# Patient Record
Sex: Female | Born: 1970
Health system: Southern US, Community
[De-identification: ages and names within clinical notes are randomized; demographics above are authoritative.]

## PROBLEM LIST (undated history)

## (undated) DIAGNOSIS — C801 Malignant (primary) neoplasm, unspecified: Secondary | ICD-10-CM

## (undated) DIAGNOSIS — I499 Cardiac arrhythmia, unspecified: Secondary | ICD-10-CM

## (undated) DIAGNOSIS — R1011 Right upper quadrant pain: Secondary | ICD-10-CM

## (undated) DIAGNOSIS — Z9889 Other specified postprocedural states: Secondary | ICD-10-CM

## (undated) DIAGNOSIS — G43909 Migraine, unspecified, not intractable, without status migrainosus: Secondary | ICD-10-CM

## (undated) DIAGNOSIS — R42 Dizziness and giddiness: Secondary | ICD-10-CM

## (undated) DIAGNOSIS — R55 Syncope and collapse: Secondary | ICD-10-CM

## (undated) DIAGNOSIS — F419 Anxiety disorder, unspecified: Secondary | ICD-10-CM

## (undated) DIAGNOSIS — R112 Nausea with vomiting, unspecified: Secondary | ICD-10-CM

## (undated) DIAGNOSIS — K824 Cholesterolosis of gallbladder: Secondary | ICD-10-CM

## (undated) DIAGNOSIS — N809 Endometriosis, unspecified: Secondary | ICD-10-CM

## (undated) DIAGNOSIS — N83209 Unspecified ovarian cyst, unspecified side: Secondary | ICD-10-CM

## (undated) HISTORY — DX: Cholesterolosis of gallbladder: K82.4

## (undated) HISTORY — PX: OVARIAN CYST REMOVAL: SHX89

## (undated) HISTORY — DX: Syncope and collapse: R55

## (undated) HISTORY — DX: Endometriosis, unspecified: N80.9

## (undated) HISTORY — DX: Unspecified ovarian cyst, unspecified side: N83.209

## (undated) HISTORY — DX: Cardiac arrhythmia, unspecified: I49.9

## (undated) HISTORY — DX: Right upper quadrant pain: R10.11

---

## 2009-05-13 ENCOUNTER — Ambulatory Visit: Payer: Self-pay | Admitting: Family Medicine

## 2009-06-25 ENCOUNTER — Ambulatory Visit: Payer: Self-pay | Admitting: Obstetrics and Gynecology

## 2009-07-01 ENCOUNTER — Ambulatory Visit: Payer: Self-pay | Admitting: Obstetrics and Gynecology

## 2009-11-18 ENCOUNTER — Ambulatory Visit: Payer: Self-pay | Admitting: Family Medicine

## 2009-12-05 ENCOUNTER — Ambulatory Visit: Payer: Self-pay | Admitting: Family Medicine

## 2010-02-01 HISTORY — PX: ABLATION: SHX5711

## 2010-07-06 ENCOUNTER — Other Ambulatory Visit: Payer: Self-pay | Admitting: Family Medicine

## 2010-07-06 DIAGNOSIS — R928 Other abnormal and inconclusive findings on diagnostic imaging of breast: Secondary | ICD-10-CM

## 2010-07-14 ENCOUNTER — Ambulatory Visit
Admission: RE | Admit: 2010-07-14 | Discharge: 2010-07-14 | Disposition: A | Discharge status: Home / Self Care | Payer: BC Managed Care – PPO | Source: Ambulatory Visit | Attending: Family Medicine | Admitting: Family Medicine

## 2010-07-14 DIAGNOSIS — R928 Other abnormal and inconclusive findings on diagnostic imaging of breast: Secondary | ICD-10-CM

## 2011-02-02 DIAGNOSIS — R1011 Right upper quadrant pain: Secondary | ICD-10-CM

## 2011-02-02 HISTORY — DX: Right upper quadrant pain: R10.11

## 2011-06-29 ENCOUNTER — Other Ambulatory Visit: Payer: Self-pay | Admitting: Family Medicine

## 2011-06-29 DIAGNOSIS — Z1231 Encounter for screening mammogram for malignant neoplasm of breast: Secondary | ICD-10-CM

## 2011-07-15 ENCOUNTER — Ambulatory Visit
Admission: RE | Admit: 2011-07-15 | Discharge: 2011-07-15 | Disposition: A | Discharge status: Home / Self Care | Payer: BC Managed Care – PPO | Source: Ambulatory Visit | Attending: Family Medicine | Admitting: Family Medicine

## 2011-07-15 DIAGNOSIS — Z1231 Encounter for screening mammogram for malignant neoplasm of breast: Secondary | ICD-10-CM

## 2011-07-30 ENCOUNTER — Ambulatory Visit: Payer: Self-pay | Admitting: Family Medicine

## 2011-11-22 ENCOUNTER — Ambulatory Visit: Payer: Self-pay | Admitting: General Surgery

## 2012-02-02 DIAGNOSIS — N83209 Unspecified ovarian cyst, unspecified side: Secondary | ICD-10-CM

## 2012-02-02 HISTORY — DX: Unspecified ovarian cyst, unspecified side: N83.209

## 2012-07-20 ENCOUNTER — Other Ambulatory Visit: Payer: Self-pay

## 2012-07-20 DIAGNOSIS — Z1231 Encounter for screening mammogram for malignant neoplasm of breast: Secondary | ICD-10-CM

## 2012-07-26 ENCOUNTER — Ambulatory Visit
Admission: RE | Admit: 2012-07-26 | Discharge: 2012-07-26 | Disposition: A | Discharge status: Home / Self Care | Payer: BC Managed Care – PPO | Source: Ambulatory Visit

## 2012-07-26 DIAGNOSIS — Z1231 Encounter for screening mammogram for malignant neoplasm of breast: Secondary | ICD-10-CM

## 2012-09-05 ENCOUNTER — Ambulatory Visit: Payer: Self-pay | Admitting: General Surgery

## 2012-09-06 ENCOUNTER — Encounter: Payer: Self-pay | Admitting: General Surgery

## 2012-09-13 ENCOUNTER — Encounter: Payer: Self-pay | Admitting: General Surgery

## 2012-09-13 ENCOUNTER — Ambulatory Visit (INDEPENDENT_AMBULATORY_CARE_PROVIDER_SITE_OTHER): Payer: BC Managed Care – PPO | Admitting: General Surgery

## 2012-09-13 VITALS — BP 128/72 | HR 74 | Resp 12 | Ht 67.5 in | Wt 204.0 lb

## 2012-09-13 DIAGNOSIS — K824 Cholesterolosis of gallbladder: Secondary | ICD-10-CM | POA: Insufficient documentation

## 2012-09-13 NOTE — Progress Notes (Signed)
Patient ID: Norma Wall, female   DOB: 1970/07/11, 42 y.o.   MRN: [id number]  Chief Complaint  Patient presents with  . Follow-up    1 year follow up gallbladder polyps    HPI Norma Wall is a 42 y.o. female who presents for a 1 year follow up for gallbladder polyps. The patient had ultrasound on 09/05/12. She states she still has some off and on dull pain but in within the last 3 weeks she had some sharper pains. Otherwise doing well.    The patient reports no postprandial discomfort. No dietary intolerance. When she has occasionally had a sharp pain in the right upper quadrant this is more towards the axillary line (anterior) then toward the midclavicular line.  She is experiencing some lower pelvic discomfort attributed to an ovarian cyst being followed by Senaida Lange, M.D.  HPI  Past Medical History  Diagnosis Date  . Abdominal pain, right upper quadrant 2013  . Gallbladder polyp   . Ovarian cyst 2014    right ovary    Past Surgical History  Procedure Laterality Date  . Ablation  2012    Family History  Problem Relation Age of Onset  . Lung cancer Father   . Diabetes Maternal Grandmother     Social History History  Substance Use Topics  . Smoking status: Former Smoker    Types: Cigarettes    Quit date: 02/01/1997  . Smokeless tobacco: Never Used  . Alcohol Use: Yes    No Known Allergies  Current Outpatient Prescriptions  Medication Sig Dispense Refill  . Calcium Carbonate-Vitamin D (CALTRATE 600+D PO) Take 2 tablets by mouth daily.      . Glucosamine-Vitamin D (GLUCOSAMINE PLUS VITAMIN D PO) Take 1 tablet by mouth daily.      . Multiple Vitamin (MULTIVITAMIN) tablet Take 1 tablet by mouth daily.      [name] spironolactone (ALDACTONE) 100 MG tablet Take 50 mg by mouth daily.       No current facility-administered medications for this visit.    Review of Systems Review of Systems  Constitutional: Negative.   Respiratory: Negative.    Cardiovascular: Negative.   Gastrointestinal: Negative.     Blood pressure 128/72, pulse 74, resp. rate 12, height 5' 7.5" (1.715 m), weight 204 lb (92.534 kg).  Physical Exam Physical Exam  Constitutional: She is oriented to person, place, and time. She appears well-developed and well-nourished.  Neck: No thyromegaly present.  Cardiovascular: Normal rate, regular rhythm and normal heart sounds.   No murmur heard. Pulmonary/Chest: Effort normal and breath sounds normal.  Abdominal: Soft. Normal appearance and bowel sounds are normal. There is no tenderness.  Lymphadenopathy:    She has no cervical adenopathy.  Neurological: She is alert and oriented to person, place, and time.  Skin: Skin is warm and dry.    Data Reviewed Abdominal ultrasound dated September 05, 2012 suggests a possible gallbladder polyp there the neck, 3.1 mm in diameter. No other abnormality appreciated. Previous abdominal ultrasound has suggested the possibility of polyps, but no evidence of cholelithiasis, gallbladder wall thickening or other upper abdominal pathology. A CT of the abdomen dated November 22, 2011 was notable only for fullness of the cervix.  Assessment    Gallbladder polyp, asymptomatic.  History right ovarian cyst, scheduled for careful GU in followup.     Plan    Assuming no interval change, we'll complete her surveillance of the gallbladder polyp with a repeat ultrasound in one year. Should she  be a candidate for diagnostic laparoscopy, he would be reasonable to evaluate the liver and gallbladder surface during the procedure. The possibility for cold endometriosis is present.        Earline Mayotte 09/13/2012, 10:24 PM

## 2012-09-13 NOTE — Patient Instructions (Addendum)
Patient to return in 1 year with abdominal ultrasound.

## 2013-01-01 DIAGNOSIS — N809 Endometriosis, unspecified: Secondary | ICD-10-CM

## 2013-01-01 HISTORY — DX: Endometriosis, unspecified: N80.9

## 2013-01-09 ENCOUNTER — Ambulatory Visit: Payer: Self-pay | Admitting: Obstetrics and Gynecology

## 2013-01-09 LAB — CBC
MCH: 30.2 pg (ref 26.0–34.0)
Platelet: 173 10*3/uL (ref 150–440)
WBC: 5.3 10*3/uL (ref 3.6–11.0)

## 2013-01-09 LAB — BASIC METABOLIC PANEL
Creatinine: 0.64 mg/dL (ref 0.60–1.30)
EGFR (Non-African Amer.): >60
Glucose: 88 mg/dL (ref 65–99)
Potassium: 3.8 mmol/L (ref 3.5–5.1)

## 2013-01-26 ENCOUNTER — Ambulatory Visit: Payer: Self-pay | Admitting: Obstetrics and Gynecology

## 2013-01-29 LAB — PATHOLOGY REPORT

## 2013-08-10 ENCOUNTER — Other Ambulatory Visit: Payer: Self-pay

## 2013-08-10 DIAGNOSIS — Z1231 Encounter for screening mammogram for malignant neoplasm of breast: Secondary | ICD-10-CM

## 2013-08-14 ENCOUNTER — Ambulatory Visit
Admission: RE | Admit: 2013-08-14 | Discharge: 2013-08-14 | Disposition: A | Discharge status: Home / Self Care | Payer: BC Managed Care – PPO | Source: Ambulatory Visit

## 2013-08-14 ENCOUNTER — Encounter (INDEPENDENT_AMBULATORY_CARE_PROVIDER_SITE_OTHER): Payer: Self-pay

## 2013-08-14 DIAGNOSIS — Z1231 Encounter for screening mammogram for malignant neoplasm of breast: Secondary | ICD-10-CM

## 2013-08-15 ENCOUNTER — Other Ambulatory Visit: Payer: Self-pay | Admitting: Obstetrics and Gynecology

## 2013-08-15 DIAGNOSIS — R928 Other abnormal and inconclusive findings on diagnostic imaging of breast: Secondary | ICD-10-CM

## 2013-08-22 ENCOUNTER — Ambulatory Visit
Admission: RE | Admit: 2013-08-22 | Discharge: 2013-08-22 | Disposition: A | Discharge status: Home / Self Care | Payer: BC Managed Care – PPO | Source: Ambulatory Visit | Attending: Obstetrics and Gynecology | Admitting: Obstetrics and Gynecology

## 2013-08-22 ENCOUNTER — Other Ambulatory Visit: Payer: Self-pay | Admitting: Obstetrics and Gynecology

## 2013-08-22 DIAGNOSIS — R928 Other abnormal and inconclusive findings on diagnostic imaging of breast: Secondary | ICD-10-CM

## 2013-09-06 ENCOUNTER — Encounter: Payer: Self-pay | Admitting: General Surgery

## 2013-09-06 ENCOUNTER — Ambulatory Visit: Payer: Self-pay | Admitting: General Surgery

## 2013-09-18 ENCOUNTER — Ambulatory Visit: Payer: BC Managed Care – PPO | Admitting: General Surgery

## 2013-12-03 ENCOUNTER — Encounter: Payer: Self-pay | Admitting: General Surgery

## 2014-05-25 NOTE — Op Note (Signed)
PATIENT NAME:  Norma Wall, Norma Wall MR#:  [id number] DATE OF BIRTH:  03-Jun-1970  DATE OF PROCEDURE:  01/26/2013  PREOPERATIVE DIAGNOSIS: Dysmenorrhea and pelvic pain.   POSTOPERATIVE DIAGNOSIS: Dysmenorrhea and pelvic pain and stage 2 endometriosis.  PROCEDURE PERFORMED: Diagnostic laparoscopy, bilateral ovarian cystectomy.   PRIMARY SURGEON: Malachy Mood, MD  ANESTHESIA USED: General.  ESTIMATED BLOOD LOSS: 25 mL  INTRAOPERATIVE FLUIDS: 1 liter.  URINE OUTPUT: 250 mL.  PREOPERATIVE ANTIBIOTICS: None.   DRAINS OR TUBES: None.   IMPLANTS: None.   COMPLICATIONS: None.   FINDINGS: peritoneal window in the posterior cul-de-sac on the left, both ovarian fossae obliterated by endometriosis. The left ovary contained what appeared to be an endometrioma. The right ovary contained a simple serous cyst.  SPECIMENS REMOVED: Right and left ovarian cyst wall.  PATIENT CONDITION FOLLOWING PROCEDURE: Stable.   PROCEDURE IN DETAIL: The risks, benefits, and alternatives to the procedure were discussed with the patient prior to proceeding to the operating room. The patient was taken to the operating room where she was placed under general endotracheal anesthesia. She was positioned in the dorsal lithotomy position using Allen stirrups, prepped and draped in the usual sterile fashion. A time out procedure was performed. Attention was turned to the patient's pelvis and operative speculum was placed. A single-tooth tenaculum was used to grasp the anterior lip of the cervix and the cervix was dilated to allow placement of a Hulka tenaculum. Upon placement of the Hulka tenaculum, the single-tooth and speculum were removed. Prior to placement of the Hulka tenaculum, the patient's bladder was drained with a red rubber catheter. Attention turned to the patient's abdomen. The umbilicus was infiltrated with 1% lidocaine. A stab incision was made at the base of the umbilicus and a 5 mm XL trocar was used to gain  entry into the peritoneal cavity under direct visualization. Pneumoperitoneum was established. Lateral assistant ports were placed. These were 5 mm and placed under direct visualization. Inspection of the pelvis noted the above findings. The right ovary was incised using the 44mm harmonic noting drainage of chocolate-colored fluid. The cyst wall was excised using a 5 mm LigaSure. Attention was turned to the patient's right ovary. The cyst on the right ovary was also incised and noted clear serous fluid. The cyst wall was also removed using blunt graspers. Following this the remainder of the abdomen was inspected and noted to be normal. Pneumoperitoneum was evacuated. The trocars were removed. Sponge, needle, and instrument counts were correct x2. Each trocar site was dressed with Dermabond. The patient was taken to the recovery room in stable condition.  ____________________________ Stoney Bang. Georgianne Fick, MD ams:sb D: 01/28/2013 22:28:09 ET T: 01/29/2013 07:31:48 ET JOB#: [id number]  cc: Stoney Bang. Georgianne Fick, MD, <Dictator> Conan Bowens Madelon Lips MD ELECTRONICALLY SIGNED 02/22/2013 8:53

## 2014-07-23 ENCOUNTER — Other Ambulatory Visit: Payer: Self-pay

## 2014-07-23 DIAGNOSIS — Z1231 Encounter for screening mammogram for malignant neoplasm of breast: Secondary | ICD-10-CM

## 2014-08-08 ENCOUNTER — Other Ambulatory Visit: Payer: Self-pay

## 2014-08-08 DIAGNOSIS — K824 Cholesterolosis of gallbladder: Secondary | ICD-10-CM

## 2014-08-21 ENCOUNTER — Ambulatory Visit
Admission: RE | Admit: 2014-08-21 | Discharge: 2014-08-21 | Disposition: A | Discharge status: Home / Self Care | Payer: BC Managed Care – PPO | Source: Ambulatory Visit

## 2014-08-21 DIAGNOSIS — Z1231 Encounter for screening mammogram for malignant neoplasm of breast: Secondary | ICD-10-CM

## 2014-08-23 ENCOUNTER — Other Ambulatory Visit: Payer: Self-pay | Admitting: Family Medicine

## 2014-08-23 DIAGNOSIS — R928 Other abnormal and inconclusive findings on diagnostic imaging of breast: Secondary | ICD-10-CM

## 2014-08-28 ENCOUNTER — Other Ambulatory Visit: Payer: Self-pay | Admitting: Family Medicine

## 2014-08-28 DIAGNOSIS — G43909 Migraine, unspecified, not intractable, without status migrainosus: Secondary | ICD-10-CM | POA: Insufficient documentation

## 2014-08-28 DIAGNOSIS — G43809 Other migraine, not intractable, without status migrainosus: Secondary | ICD-10-CM

## 2014-08-29 ENCOUNTER — Ambulatory Visit
Admission: RE | Admit: 2014-08-29 | Discharge: 2014-08-29 | Disposition: A | Discharge status: Home / Self Care | Payer: BC Managed Care – PPO | Source: Ambulatory Visit | Attending: Family Medicine | Admitting: Family Medicine

## 2014-08-29 DIAGNOSIS — R928 Other abnormal and inconclusive findings on diagnostic imaging of breast: Secondary | ICD-10-CM

## 2014-09-09 ENCOUNTER — Other Ambulatory Visit: Payer: Self-pay

## 2014-09-09 ENCOUNTER — Ambulatory Visit
Admission: RE | Admit: 2014-09-09 | Discharge: 2014-09-09 | Disposition: A | Discharge status: Home / Self Care | Payer: BC Managed Care – PPO | Source: Ambulatory Visit | Attending: General Surgery | Admitting: General Surgery

## 2014-09-09 ENCOUNTER — Ambulatory Visit (INDEPENDENT_AMBULATORY_CARE_PROVIDER_SITE_OTHER): Payer: BC Managed Care – PPO | Admitting: Physician Assistant

## 2014-09-09 ENCOUNTER — Encounter: Payer: Self-pay | Admitting: Physician Assistant

## 2014-09-09 VITALS — BP 106/68 | HR 68 | Temp 97.6°F | Resp 16 | Wt 224.0 lb

## 2014-09-09 DIAGNOSIS — N3001 Acute cystitis with hematuria: Secondary | ICD-10-CM

## 2014-09-09 DIAGNOSIS — K824 Cholesterolosis of gallbladder: Secondary | ICD-10-CM | POA: Diagnosis not present

## 2014-09-09 DIAGNOSIS — R300 Dysuria: Secondary | ICD-10-CM

## 2014-09-09 LAB — POCT URINALYSIS DIPSTICK
Bilirubin, UA: NEGATIVE
Glucose, UA: NEGATIVE
Ketones, UA: NEGATIVE
Nitrite, UA: NEGATIVE
Protein, UA: NEGATIVE
Spec Grav, UA: 1.015
Urobilinogen, UA: 0.2
pH, UA: 5

## 2014-09-09 MED ORDER — NITROFURANTOIN MONOHYD MACRO 100 MG PO CAPS: 100.0000 mg | ORAL_CAPSULE | Freq: Two times a day (BID) | ORAL | Status: DC

## 2014-09-09 NOTE — Progress Notes (Signed)
Patient ID: Norma Wall, female   DOB: Jun 03, 1970, 44 y.o.   MRN: [id number]   Patient: Norma Wall Female    DOB: 30-May-1970   44 y.o.   MRN: [id number] Visit Date: 09/09/2014  Today's Provider: Mar Daring, PA-C   Chief Complaint  Patient presents with  . Urinary Tract Infection    X 1 day   Subjective:    HPI  Norma Wall no leg is a 44 year old female that returns to the office today for dysuria.   She states that she had one episode of burning with urination this morning. She also mentions, however, that she did have foul smelling urine on Thursday and Friday of this past week but didn't think anything of it.   She states she has had UTIs in the past but she has not had one in many years. She does mention she has been swimming more recently and was curious that that may have been the cause of the UTI. She denies any fevers, chills, nausea or vomiting,  Or hematuria.   Previous Medications   ANUCORT-HC 25 MG SUPPOSITORY       CALCIUM CARBONATE-VITAMIN D (CALTRATE 600+D PO)    Take 2 tablets by mouth daily.   GLUCOSAMINE-VITAMIN D (GLUCOSAMINE PLUS VITAMIN D PO)    Take 1 tablet by mouth daily.   MEDROXYPROGESTERONE (DEPO-PROVERA) 150 MG/ML INJECTION    use as directed every 3 months   MULTIPLE VITAMIN (MULTIVITAMIN) TABLET    Take 1 tablet by mouth daily.   PROMETHAZINE (PHENERGAN) 25 MG TABLET    take 1 tablet by mouth every 4 hours if needed for nausea and vomiting   SPIRONOLACTONE (ALDACTONE) 100 MG TABLET    Take 50 mg by mouth daily.    Review of Systems  Constitutional: Negative for fever and chills.  Gastrointestinal: Negative for nausea and vomiting.  Genitourinary: Positive for dysuria and pelvic pain (she has pelvic pain constantly due to adhesions and cyst). Negative for urgency, frequency, hematuria, flank pain, decreased urine volume, vaginal discharge, difficulty urinating and vaginal pain.    History  Substance Use Topics  . Smoking status:  Former Smoker    Types: Cigarettes    Quit date: 02/01/1997  . Smokeless tobacco: Never Used  . Alcohol Use: Yes   Objective:   BP 106/68 mmHg  Pulse 68  Temp(Src) 97.6 F (36.4 C) (Oral)  Resp 16  Wt 224 lb (101.606 kg)  Physical Exam  Constitutional: She appears well-developed and well-nourished. No distress.  Cardiovascular: Normal rate, regular rhythm, normal heart sounds and intact distal pulses.  Exam reveals no gallop and no friction rub.   No murmur heard. Pulmonary/Chest: Effort normal and breath sounds normal. No respiratory distress. She has no wheezes. She has no rales. She exhibits no tenderness.  Abdominal: Soft. Normal appearance and bowel sounds are normal. She exhibits no distension and no mass. There is no hepatosplenomegaly. There is tenderness in the suprapubic area. There is no rebound, no guarding and no CVA tenderness.  Suprapubic tenderness  Skin: She is not diaphoretic.  Vitals reviewed.       Assessment & Plan:     1. Dysuria  UA was positive with leukocytes and blood. - POCT Urinalysis Dipstick  2. Acute cystitis with hematuria  Will treat with Macrobid as below. I will also send off her urine for urine culture and sensitivities. Will adjust antibiotics as needed once I receive the results of the urine culture. She is to  call the office if symptoms do not improve or worsen. - nitrofurantoin, macrocrystal-monohydrate, (MACROBID) 100 MG capsule; Take 1 capsule (100 mg total) by mouth 2 (two) times daily.  Dispense: 14 capsule; Refill: 0 - Urine Culture   Follow up: No Follow-up on file.

## 2014-09-09 NOTE — Patient Instructions (Signed)

## 2014-09-10 ENCOUNTER — Ambulatory Visit: Payer: Self-pay | Admitting: General Surgery

## 2014-09-11 ENCOUNTER — Telehealth: Payer: Self-pay

## 2014-09-11 LAB — URINE CULTURE

## 2014-09-11 NOTE — Telephone Encounter (Signed)
-----   Message from Mar Daring, [place] sent at 09/11/2014 11:43 AM EDT ----- Urine culture was positive but is susceptible to antibiotic you were prescribed.  Continue antibiotic until completed even if Sx subside.  Thanks! -JB

## 2014-09-11 NOTE — Telephone Encounter (Signed)
Patient advised as directed below.  Thanks,  -Arabell Neria 

## 2014-09-11 NOTE — Telephone Encounter (Signed)
LMTCB 09/11/14  Thanks,  -Joseline

## 2014-09-17 ENCOUNTER — Encounter: Payer: Self-pay | Admitting: General Surgery

## 2014-09-17 ENCOUNTER — Ambulatory Visit (INDEPENDENT_AMBULATORY_CARE_PROVIDER_SITE_OTHER): Payer: BC Managed Care – PPO | Admitting: General Surgery

## 2014-09-17 VITALS — BP 110/60 | HR 74 | Resp 12 | Ht 67.0 in | Wt 221.0 lb

## 2014-09-17 DIAGNOSIS — K824 Cholesterolosis of gallbladder: Secondary | ICD-10-CM

## 2014-09-17 NOTE — Patient Instructions (Signed)
Call with any problems or new symptoms.

## 2014-09-17 NOTE — Progress Notes (Signed)
Patient ID: Norma Wall, female   DOB: Jul 17, 1970, 44 y.o.   MRN: [id number]  Chief Complaint  Patient presents with  . Follow-up    gall bladder polyp    HPI Norma Wall is a 44 y.o. female here today for her one year follow up gall bladder polyp. Ultrasound was 09-09-14. She states she still has some off and on dull pain on her left side that has not changed in the last year. She denies any GI symptoms. She reports that she has developed some hip pains that she will be following up on.     HPI  Past Medical History  Diagnosis Date  . Abdominal pain, right upper quadrant 2013  . Gallbladder polyp   . Ovarian cyst 2014    right ovary  . Endometriosis 01/2013    Past Surgical History  Procedure Laterality Date  . Ablation  2012    cyst removal    Family History  Problem Relation Age of Onset  . Lung cancer Father   . Diabetes Maternal Grandmother     Social History Social History  Substance Use Topics  . Smoking status: Former Smoker    Types: Cigarettes    Quit date: 02/01/1997  . Smokeless tobacco: Never Used  . Alcohol Use: Yes    No Known Allergies  Current Outpatient Prescriptions  Medication Sig Dispense Refill  . ANUCORT-HC 25 MG suppository Place 25 mg rectally as needed.   0  . Calcium Carbonate-Vitamin D (CALTRATE 600+D PO) Take 2 tablets by mouth as needed.     . Glucosamine-Vitamin D (GLUCOSAMINE PLUS VITAMIN D PO) Take 1 tablet by mouth daily.    . medroxyPROGESTERone (DEPO-PROVERA) 150 MG/ML injection use as directed every 3 months  0  . promethazine (PHENERGAN) 25 MG tablet take 1 tablet by mouth every 4 hours if needed for nausea and vomiting 20 tablet 0  . spironolactone (ALDACTONE) 100 MG tablet Take 50 mg by mouth daily.    . Multiple Vitamin (MULTIVITAMIN) tablet Take 1 tablet by mouth daily.     No current facility-administered medications for this visit.    Review of Systems Review of Systems  Constitutional:  Negative.   Respiratory: Negative.   Cardiovascular: Negative.   Gastrointestinal: Negative.     Blood pressure 110/60, pulse 74, resp. rate 12, height 5\' 7"  (1.702 m), weight 221 lb (100.245 kg).  Physical Exam Physical Exam  Constitutional: She is oriented to person, place, and time. She appears well-developed and well-nourished.  Neck: Neck supple.  Cardiovascular: Normal rate, regular rhythm and normal heart sounds.   Pulmonary/Chest: Effort normal and breath sounds normal.  Abdominal: Soft. Bowel sounds are normal.    Lymphadenopathy:    She has no cervical adenopathy.  Neurological: She is alert and oriented to person, place, and time.  Skin: Skin is warm and dry.  Psychiatric: She has a normal mood and affect.    Data Reviewed Abdominal ultrasound dated 09/09/2014 documented a 0.5 mm change over 2 years in the gallbladder polyp. No other evidence of cholecystitis or cholelithiasis.  Assessment    Gallbladder polyp, asymptomatic.  Intermittent, transient right lower quadrant discomfort without clear etiology.    Plan    The patient reports the right lower quadrant pain has been present intermittently over the past 2 years. There is no increase in frequency or severity. It does not interfere with activity. She is most commonly aware of it when she is at rest. With no  change in bowel or bladder function. No weight loss or other constitutional symptoms, observation alone is warranted at this time.  With a less than 1 mm change and the gallbladder polyp over 2 years, no further follow-up is required.  The patient was encouraged to call should she develop new abdominal symptoms, follow up otherwise will be on an as-needed basis.     PCP:  Etheleen Mayhew 09/17/2014, 9:47 PM

## 2014-11-07 ENCOUNTER — Other Ambulatory Visit: Payer: Self-pay | Admitting: Family Medicine

## 2014-11-07 NOTE — Telephone Encounter (Signed)
See patient request for medication

## 2014-11-07 NOTE — Telephone Encounter (Signed)
Printed.  Thanks.  

## 2014-11-20 ENCOUNTER — Ambulatory Visit (INDEPENDENT_AMBULATORY_CARE_PROVIDER_SITE_OTHER): Payer: BC Managed Care – PPO | Admitting: Family Medicine

## 2014-11-20 ENCOUNTER — Encounter: Payer: Self-pay | Admitting: Family Medicine

## 2014-11-20 VITALS — BP 110/72 | HR 80 | Temp 98.2°F | Resp 16 | Ht 67.0 in | Wt 226.0 lb

## 2014-11-20 DIAGNOSIS — Z209 Contact with and (suspected) exposure to unspecified communicable disease: Secondary | ICD-10-CM

## 2014-11-20 DIAGNOSIS — E875 Hyperkalemia: Secondary | ICD-10-CM | POA: Insufficient documentation

## 2014-11-20 DIAGNOSIS — K644 Residual hemorrhoidal skin tags: Secondary | ICD-10-CM | POA: Insufficient documentation

## 2014-11-20 DIAGNOSIS — L309 Dermatitis, unspecified: Secondary | ICD-10-CM | POA: Insufficient documentation

## 2014-11-20 DIAGNOSIS — M7630 Iliotibial band syndrome, unspecified leg: Secondary | ICD-10-CM | POA: Diagnosis not present

## 2014-11-20 DIAGNOSIS — R42 Dizziness and giddiness: Secondary | ICD-10-CM

## 2014-11-20 DIAGNOSIS — M25551 Pain in right hip: Secondary | ICD-10-CM | POA: Insufficient documentation

## 2014-11-20 DIAGNOSIS — M719 Bursopathy, unspecified: Secondary | ICD-10-CM | POA: Diagnosis not present

## 2014-11-20 DIAGNOSIS — F439 Reaction to severe stress, unspecified: Secondary | ICD-10-CM

## 2014-11-20 DIAGNOSIS — R002 Palpitations: Secondary | ICD-10-CM

## 2014-11-20 DIAGNOSIS — E669 Obesity, unspecified: Secondary | ICD-10-CM | POA: Insufficient documentation

## 2014-11-20 NOTE — Patient Instructions (Signed)
Bursitis °Bursitis is inflammation and irritation of a bursa, which is one of the small, fluid-filled sacs that cushion and protect the moving parts of your body. These sacs are located between bones and muscles, muscle attachments, or skin areas next to bones. A bursa protects these structures from the wear and tear that results from frequent movement. °An inflamed bursa causes pain and swelling. Fluid may build up inside the sac. Bursitis is most common near joints, especially the knees, elbows, hips, and shoulders. °CAUSES °Bursitis can be caused by:  °· Injury from: °¨ A direct blow, like falling on your knee or elbow. °¨ Overuse of a joint (repetitive stress). °· Infection. This can happen if bacteria gets into a bursa through a cut or scrape near a joint. °· Diseases that cause joint inflammation, such as gout and rheumatoid arthritis. °RISK FACTORS °You may be at risk for bursitis if you:  °· Have a job or hobby that involves a lot of repetitive stress on your joints. °· Have a condition that weakens your body's defense system (immune system), such as diabetes, cancer, or HIV. °· Lift and reach overhead often. °· Kneel or lean on hard surfaces often. °· Run or walk often. °SIGNS AND SYMPTOMS °The most common signs and symptoms of bursitis are: °· Pain that gets worse when you move the affected body part or put weight on it. °· Inflammation. °· Stiffness. °Other signs and symptoms may include: °· Redness. °· Tenderness. °· Warmth. °· Pain that continues after rest. °· Fever and chills. This may occur in bursitis caused by infection. °DIAGNOSIS °Bursitis may be diagnosed by:  °· Medical history and physical exam. °· MRI. °· A procedure to drain fluid from the bursa with a needle (aspiration). The fluid may be checked for signs of infection or gout. °· Blood tests to rule out other causes of inflammation. °TREATMENT  °Bursitis can usually be treated at home with rest, ice, compression, and elevation (RICE). For  mild bursitis, RICE treatment may be all you need. Other treatments may include: °· Nonsteroidal anti-inflammatory drugs (NSAIDs) to treat pain and inflammation. °· Corticosteroids to fight inflammation. You may have these drugs injected into and around the area of bursitis. °· Aspiration of bursitis fluid to relieve pain and improve movement. °· Antibiotic medicine to treat an infected bursa. °· A splint, brace, or walking aid. °· Physical therapy if you continue to have pain or limited movement. °· Surgery to remove a damaged or infected bursa. This may be needed if you have a very bad case of bursitis or if other treatments have not worked. °HOME CARE INSTRUCTIONS  °· Take medicines only as directed by your health care provider. °· If you were prescribed an antibiotic medicine, finish it all even if you start to feel better. °· Rest the affected area as directed by your health care provider. °¨ Keep the area elevated. °¨ Avoid activities that make pain worse. °· Apply ice to the injured area: °¨ Place ice in a plastic bag. °¨ Place a towel between your skin and the bag. °¨ Leave the ice on for 20 minutes, 2-3 times a day. °· Use splints, braces, pads, or walking aids as directed by your health care provider. °· Keep all follow-up visits as directed by your health care provider. This is important. °PREVENTION  °· Wear knee pads if you kneel often. °· Wear sturdy running or walking shoes that fit you well. °· Take regular breaks from repetitive activity. °· Warm   up by stretching before doing any strenuous activity. °· Maintain a healthy weight or lose weight as recommended by your health care provider. Ask your health care provider if you need help. °· Exercise regularly. Start any new physical activity gradually. °SEEK MEDICAL CARE IF:  °· Your bursitis is not responding to treatment or home care. °· You have a fever. °· You have chills. °  °This information is not intended to replace advice given to you by your  health care provider. Make sure you discuss any questions you have with your health care provider. °  °Document Released: 01/16/2000 Document Revised: 10/09/2014 Document Reviewed: 04/09/2013 °Elsevier Interactive Patient Education ©2016 [employer] ° °

## 2014-11-20 NOTE — Progress Notes (Signed)
Patient ID: Norma Wall, female   DOB: 10-Mar-1970, 44 y.o.   MRN: [id number]        Patient: Norma Wall Female    DOB: 08/04/1970   44 y.o.   MRN: [id number] Visit Date: 11/20/2014  Today's Provider: Margarita Rana, MD   Chief Complaint  Patient presents with  . Hip Pain   Subjective:    Hip Pain  There was no injury mechanism (Can be with sitting and with certain postiions and getting up to walk.  ). The pain is present in the left hip and right hip (right hip pain worse than left). The quality of the pain is described as aching. The pain is at a severity of 4/10 (at the worst). The pain has been fluctuating since onset. Associated symptoms include muscle weakness (in thigh, hamsting and lower back). Pertinent negatives include no inability to bear weight, loss of motion, loss of sensation, numbness or tingling. Nothing (lack of movement pt feels stiff when starting to move) aggravates the symptoms. She has tried NSAIDs for the symptoms. The treatment provided moderate (Can not sit criss cross.  Can not get up. Also sleeping.  ) relief.   Walking at work.  6000 steps today, 8000 or 9000 today.   Does stretch in am. Not sure what she is doing is ok, or if there is something else she should be doing. No previous work up.  Is getting worse.  Can not sit with legs crossed.       Allergies  Allergen Reactions  . Other     Itching to cantaloupe and carrots   Previous Medications   AFLURIA PRESERVATIVE FREE 0.5 ML SUSY    inject 0.5 milliliter intramuscularly   ANUCORT-HC 25 MG SUPPOSITORY    Place 25 mg rectally as needed.    CALCIUM CARBONATE-VITAMIN D (CALTRATE 600+D PO)    Take 2 tablets by mouth as needed.    CLONAZEPAM (KLONOPIN) 0.5 MG TABLET    take 1 tablet by mouth twice a day if needed anxiety or PALPITATIONS.   DICLOFENAC (VOLTAREN) 75 MG EC TABLET    Take by mouth.   GLUCOSAMINE-VITAMIN D (GLUCOSAMINE PLUS VITAMIN D PO)    Take 1 tablet by mouth daily.   MEDROXYPROGESTERONE (DEPO-PROVERA) 150 MG/ML INJECTION    use as directed every 3 months   MULTIPLE VITAMIN (MULTIVITAMIN) TABLET    Take 1 tablet by mouth daily.   NITROFURANTOIN, MACROCRYSTAL-MONOHYDRATE, (MACROBID) 100 MG CAPSULE    take 1 capsule by mouth twice a day for 7 days   PROMETHAZINE (PHENERGAN) 25 MG TABLET    take 1 tablet by mouth every 4 hours if needed for nausea and vomiting   SPIRONOLACTONE (ALDACTONE) 100 MG TABLET    Take 50 mg by mouth daily.    Review of Systems  Constitutional: Negative for activity change and appetite change.  Musculoskeletal: Positive for myalgias, arthralgias and gait problem. Negative for back pain.  Neurological: Negative for tingling and numbness.    Social History  Substance Use Topics  . Smoking status: Former Smoker    Types: Cigarettes    Quit date: 02/01/1997  . Smokeless tobacco: Never Used  . Alcohol Use: No   Objective:   BP 110/72 mmHg  Pulse 80  Temp(Src) 98.2 F (36.8 C) (Oral)  Resp 16  Ht 5\' 7"  (1.702 m)  Wt 226 lb (102.513 kg)  BMI 35.39 kg/m2  Physical Exam  Constitutional: She is oriented to person, place, and  time. She appears well-developed and well-nourished.  Musculoskeletal: Normal range of motion. She exhibits no edema or tenderness.  Neurological: She is alert and oriented to person, place, and time. Coordination normal.      Assessment & Plan:     1. Bursitis Suspect bursitis. Ice three times daily and call if does not improve and can schedule injection with Tawanna Sat.   2. Iliotibial band syndrome, unspecified laterality Suspect underlying cause of pain. Recommend roll with rolling stick twice day and stretch (shown bed stretch in office).  Will refer to PT if does not  Improve.       Margarita Rana, MD  [facility]

## 2015-01-08 ENCOUNTER — Encounter: Payer: Self-pay | Admitting: Physician Assistant

## 2015-01-08 ENCOUNTER — Telehealth: Payer: Self-pay | Admitting: Physician Assistant

## 2015-01-08 ENCOUNTER — Ambulatory Visit
Admission: RE | Admit: 2015-01-08 | Discharge: 2015-01-08 | Disposition: A | Discharge status: Home / Self Care | Payer: BC Managed Care – PPO | Source: Ambulatory Visit | Attending: Physician Assistant | Admitting: Physician Assistant

## 2015-01-08 ENCOUNTER — Ambulatory Visit (INDEPENDENT_AMBULATORY_CARE_PROVIDER_SITE_OTHER): Payer: BC Managed Care – PPO | Admitting: Physician Assistant

## 2015-01-08 VITALS — BP 110/70 | HR 82 | Temp 98.3°F | Resp 16 | Wt 225.8 lb

## 2015-01-08 DIAGNOSIS — M6289 Other specified disorders of muscle: Secondary | ICD-10-CM | POA: Insufficient documentation

## 2015-01-08 DIAGNOSIS — M79662 Pain in left lower leg: Secondary | ICD-10-CM | POA: Diagnosis not present

## 2015-01-08 DIAGNOSIS — R059 Cough, unspecified: Secondary | ICD-10-CM

## 2015-01-08 DIAGNOSIS — R05 Cough: Secondary | ICD-10-CM | POA: Diagnosis not present

## 2015-01-08 DIAGNOSIS — M7989 Other specified soft tissue disorders: Secondary | ICD-10-CM | POA: Diagnosis not present

## 2015-01-08 DIAGNOSIS — J069 Acute upper respiratory infection, unspecified: Secondary | ICD-10-CM | POA: Diagnosis not present

## 2015-01-08 MED ORDER — AZITHROMYCIN 250 MG PO TABS: ORAL_TABLET | ORAL | Status: DC

## 2015-01-08 MED ORDER — HYDROCODONE-HOMATROPINE 5-1.5 MG/5ML PO SYRP
5.0000 mL | ORAL_SOLUTION | Freq: Three times a day (TID) | ORAL | Status: DC | PRN
Start: 2015-01-08 — End: 2015-03-25

## 2015-01-08 NOTE — Telephone Encounter (Signed)
[employer] would like a peer to peer to get MRI approved.Phone # [phone].Case will be closed Dec 9th at 4:00 PM

## 2015-01-08 NOTE — Patient Instructions (Signed)
Medial Head Gastrocnemius Tear With Rehab °Medial head gastrocnemius tear, also called tennis leg, is a tear (strain) in a muscle or tendon of the inner portion (medial head) of one of the calf muscles (gastrocnemius). The inner portion of the calf muscle attaches to the thigh bone (femur) and is responsible for bending the knee and straightening the foot (standing "on tiptoe"). Strains are classified into three categories. Grade 1 strains cause pain, but the tendon is not lengthened. Grade 2 strains include a lengthened ligament, due to the ligament being stretched or partially ruptured. With grade 2 strains there is still function, although function may be decreased. Grade 3 strains involve a complete tear of the tendon or muscle, and function is usually impaired. °SYMPTOMS  °· Sudden "pop" or tear felt at the time of injury. °· Pain, tenderness, swelling, warmth, or redness over the middle inner calf. °· Pain and weakness with ankle motion, especially flexing the ankle against resistance, as well as pain with lifting up the foot (extending the ankle). °· Bruising (contusion) of the calf, heel, and sometimes the foot within 48 hours of injury. °· Muscle spasm in the calf. °CAUSES  °Muscle and ligament strains occur when a force is placed on the muscle or ligament that is greater than it can handle. Common causes of injury include: °· Direct hit (trauma) to the calf. °· Sudden forceful pushing off or landing on the foot (jumping, landing, serving a tennis ball, lunging). °RISK INCREASES WITH: °· Sports that require sudden, explosive calf muscle contraction, such as those involving jumping (basketball), hill running, quick starts (running), or lunging (racquetball, tennis). °· Contact sports (football, soccer, hockey). °· Poor strength and flexibility. °· Previous lower limb injury. °PREVENTION °· Warm up and stretch properly before activity. °· Allow for adequate recovery between workouts. °· Maintain physical  fitness: °¨ Strength, flexibility, and endurance. °¨ Cardiovascular fitness. °· Learn and use proper exercise technique. °· Complete rehabilitation after lower limb injury, before returning to competition or practice. °PROGNOSIS  °If treated properly, tennis leg usually heals within 6 weeks of nonsurgical treatment.  °RELATED COMPLICATIONS  °· Longer healing time, if not properly treated or if not given enough time to heal. °· Recurring symptoms and injury, if activity is resumed too soon, with overuse, with a direct blow, or with poor technique. °· If untreated, may progress to a complete tear (rare) or other injury, due to limping and favoring of the injured leg. °· Persistent limping, due to scarring and shortening of the calf muscles, as a result of inadequate rehabilitation. °· Prolonged disability. °TREATMENT  °Treatment first involves the use of ice and medication to help reduce pain and inflammation. The use of strengthening and stretching exercises may help reduce pain with activity. These exercises may be performed at home or with a therapist. For severe injuries, referral to a therapist may be needed for further evaluation and treatment. Your caregiver may advise that you wear a brace to help healing. Sometimes, crutches are needed until you can walk without limping. Rarely, surgery is needed.  °MEDICATION  °· If pain medicine is needed, nonsteroidal anti-inflammatory medicines (aspirin and ibuprofen), or other minor pain relievers (acetaminophen), are often advised. °· Do not take pain medicine for 7 days before surgery. °· Prescription pain relievers may be given, if your caregiver thinks they are needed. Use only as directed and only as much as you need. °HEAT AND COLD °· Cold treatment (icing) should be applied for 10 to 15 minutes every   2 to 3 hours for inflammation and pain, and immediately after activity that aggravates your symptoms. Use ice packs or an ice massage. °· Heat treatment may be used  before performing stretching and strengthening activities prescribed by your caregiver, physical therapist, or athletic trainer. Use a heat pack or a warm water soak. °SEEK MEDICAL CARE IF:  °· Symptoms get worse or do not improve in 2 weeks, despite treatment. °· Numbness or tingling develops. °· New, unexplained symptoms develop. (Drugs used in treatment may produce side effects.) °EXERCISES  °RANGE OF MOTION (ROM) AND STRETCHING EXERCISES - Medial Head Gastrocnemius Tear (Tennis Leg) °These exercises may help you when beginning to rehabilitate your injury. Your symptoms may resolve with or without further involvement from your physician, physical therapist, or athletic trainer. While completing these exercises, remember:  °· Restoring tissue flexibility helps normal motion to return to the joints. This allows healthier, less painful movement and activity. °· An effective stretch should be held for at least 30 seconds. °· A stretch should never be painful. You should only feel a gentle lengthening or release in the stretched tissue. °STRETCH - Gastrocsoleus °· Sit with your right / left leg extended. Holding onto both ends of a belt or towel, loop it around the ball of your foot. °· Keeping your right / left ankle and foot relaxed and your knee straight, pull your foot and ankle toward you using the belt. °· You should feel a gentle stretch behind your calf or knee. Hold this position for __________ seconds. °Repeat __________ times. Complete this stretch __________ times per day.  °RANGE OF MOTION - Ankle Dorsiflexion, Active Assisted  °· Remove your shoes and sit on a chair, preferably not on a carpeted surface. °· Place your right / left foot directly under the knee. Extend your opposite leg for support. °· Keeping your heel down, slide your right / left foot back toward the chair, until you feel a stretch at your ankle or calf. If you do not feel a stretch, slide your bottom forward to the edge of the chair,  while still keeping your heel down. °· Hold this stretch for __________ seconds. °Repeat __________ times. Complete this stretch __________ times per day.  °STRETCH - Gastroc, Standing  °· Place your hands on a wall. °· Extend your right / left leg behind you, keeping the front knee somewhat bent. °· Slightly point your toes inward on your back foot. °· Keeping your right / left heel on the floor and your knee straight, shift your weight toward the wall, not allowing your back to arch. °· You should feel a gentle stretch in the right / left calf. Hold this position for __________ seconds. °Repeat __________ times. Complete this stretch __________ times per day. °STRETCH - Soleus, Standing  °· Place your hands on a wall. °· Extend your right / left leg behind you, keeping the other knee somewhat bent. °· Point your toes of your back foot slightly inward. °· Keep your right / left heel on the floor, bend your back knee, and slightly shift your weight over the back leg so that you feel a gentle stretch deep in your back calf. °· Hold this position for __________ seconds. °Repeat __________ times. Complete this stretch __________ times per day. °STRETCH - Gastrocsoleus, Standing °Note: This exercise can place a lot of stress on your foot and ankle. Please complete this exercise only if specifically instructed by your caregiver.  °· Place the ball of your right /   left foot on a step, keeping your other foot firmly on the same step. °· Hold on to the wall or a rail for balance. °· Slowly lift your other foot, allowing your body weight to press your heel down over the edge of the step. °· You should feel a stretch in your right / left calf. °· Hold this position for __________ seconds. °· Repeat this exercise with a slight bend in your right / left knee. °Repeat __________ times. Complete this stretch __________ times per day.  °STRENGTHENING EXERCISES - Medial Head Gastrocnemius Tear (Tennis Leg) °These exercises may help  you when beginning to rehabilitate your injury. They may resolve your symptoms with or without further involvement from your physician, physical therapist, or athletic trainer. While completing these exercises, remember:  °· Muscles can gain both the endurance and the strength needed for everyday activities through controlled exercises. °· Complete these exercises as instructed by your physician, physical therapist, or athletic trainer. Increase the resistance and repetitions only as guided by your caregiver. °STRENGTH - Plantar-flexors °· Sit with your right / left leg extended. Holding onto both ends of a rubber exercise band or tubing, loop it around the ball of your foot. Keep a slight tension in the band. °· Slowly push your toes away from you, pointing them downward. °· Hold this position for __________ seconds. Return slowly, controlling the tension in the band. °Repeat __________ times. Complete this exercise __________ times per day.  °STRENGTH - Plantar-flexors °· Stand with your feet shoulder width apart. Steady yourself with a wall or table, using as little support as needed. °· Keeping your weight evenly spread over the width of your feet, rise up on your toes.* °· Hold this position for __________ seconds. °Repeat __________ times. Complete this exercise __________ times per day.  °*If this is too easy, shift your weight toward your right / left leg until you feel challenged. Ultimately, you may be asked to do this exercise while standing on your right / left foot only. °STRENGTH - Plantar-flexors, Eccentric °Note: This exercise can place a lot of stress on your foot and ankle. Please complete this exercise only if specifically instructed by your caregiver.  °· Place the balls of your feet on a step. With your hands, use only enough support from a wall or rail to keep your balance. °· Keep your knees straight and rise up on your toes. °· Slowly shift your weight entirely to your right / left toes and  pick up your opposite foot. Gently and with controlled movement, lower your weight through your right / left foot so that your heel drops below the level of the step. You will feel a slight stretch in the back of your right / left calf. °· Use the healthy leg to help rise up onto the balls of both feet, then lower weight only onto the right / left leg again. Build up to 15 repetitions. Then progress to 3 sets of 15 repetitions.* °· After completing the above exercise, complete the same exercise with a slight knee bend (about 30 degrees). Again, build up to 15 repetitions. Then progress to 3 sets of 15 repetitions.* °Perform this exercise __________ times per day.  °*When you easily complete 3 sets of 15, your physician, physical therapist, or athletic trainer may advise you to add resistance, by wearing a backpack filled with additional weight. °  °This information is not intended to replace advice given to you by your health care provider. Make   sure you discuss any questions you have with your health care provider. °  °Document Released: 01/18/2005 Document Revised: 02/08/2014 Document Reviewed: 05/02/2008 °Elsevier Interactive Patient Education ©2016 [employer] ° °

## 2015-01-08 NOTE — Progress Notes (Signed)
Patient: Norma Wall Female    DOB: 11-04-1970   44 y.o.   MRN: [id number] Visit Date: 01/08/2015  Today's Provider: Mar Daring, PA-C   Chief Complaint  Patient presents with  . Leg Pain  . Cough   Subjective:    Leg Pain  The incident occurred 3 to 6 hours ago. There was no injury mechanism. Pain location: Left Calf. The quality of the pain is described as aching, shooting and stabbing (Per patient  this happened this morning while walkinf and just  out of nowhere had the sharp pain). The pain is at a severity of 7/10 (When tries to walk). The pain is moderate. The pain has been constant (right now when sitting still is a dull pain) since onset. Associated symptoms include an inability to bear weight. The symptoms are aggravated by weight bearing. Treatments tried: Ibuprofen at 9:30. The treatment provided no relief.  Cough This is a new problem. The current episode started 1 to 4 weeks ago. The cough is productive of sputum (last week was yelllowish). Associated symptoms include myalgias (left calf muscle), nasal congestion, postnasal drip, rhinorrhea, a sore throat and sweats (just two nights.). Pertinent negatives include no chills, fever, shortness of breath or wheezing. Nothing aggravates the symptoms. She has tried OTC cough suppressant (Mucinex for cough, Sudafed.) for the symptoms. The treatment provided no relief.        Allergies  Allergen Reactions  . Other     Itching to cantaloupe and carrots   Previous Medications   ANUCORT-HC 25 MG SUPPOSITORY    Place 25 mg rectally as needed.    BENZONATATE (TESSALON) 100 MG CAPSULE    take 1 capsule by mouth every 8 hours if needed cough   CLONAZEPAM (KLONOPIN) 0.5 MG TABLET    take 1 tablet by mouth twice a day if needed anxiety or PALPITATIONS.   DICLOFENAC (VOLTAREN) 75 MG EC TABLET    Take by mouth.   GLUCOSAMINE-VITAMIN D (GLUCOSAMINE PLUS VITAMIN D PO)    Take 1 tablet by mouth daily.   MEDROXYPROGESTERONE (DEPO-PROVERA) 150 MG/ML INJECTION    use as directed every 3 months   MULTIPLE VITAMIN (MULTIVITAMIN) TABLET    Take 1 tablet by mouth daily.   PROMETHAZINE (PHENERGAN) 25 MG TABLET    take 1 tablet by mouth every 4 hours if needed for nausea and vomiting   SPIRONOLACTONE (ALDACTONE) 100 MG TABLET    Take 50 mg by mouth daily.    Review of Systems  Constitutional: Negative for fever and chills.  HENT: Positive for congestion, postnasal drip, rhinorrhea and sore throat.   Eyes: Negative.   Respiratory: Positive for cough. Negative for chest tightness, shortness of breath and wheezing.   Cardiovascular: Negative.   Gastrointestinal: Negative.   Musculoskeletal: Positive for myalgias (left calf muscle) and gait problem (cannot push off on toes of left foot due to pain in left calf muscle).    Social History  Substance Use Topics  . Smoking status: Former Smoker    Types: Cigarettes    Quit date: 02/01/1997  . Smokeless tobacco: Never Used  . Alcohol Use: No   Objective:   BP 110/70 mmHg  Pulse 82  Temp(Src) 98.3 F (36.8 C) (Oral)  Resp 16  Wt 225 lb 12.8 oz (102.422 kg)  Physical Exam  Constitutional: She appears well-developed and well-nourished. No distress.  HENT:  Head: Normocephalic and atraumatic.  Right Ear: Hearing, external ear  and ear canal normal. A middle ear effusion is present.  Left Ear: Hearing, external ear and ear canal normal. A middle ear effusion is present.  Nose: Nose normal. Right sinus exhibits no maxillary sinus tenderness and no frontal sinus tenderness. Left sinus exhibits no maxillary sinus tenderness and no frontal sinus tenderness.  Mouth/Throat: Uvula is midline, oropharynx is clear and moist and mucous membranes are normal. No oropharyngeal exudate.  Eyes: Conjunctivae are normal. Pupils are equal, round, and reactive to light. Right eye exhibits no discharge. Left eye exhibits no discharge. No scleral icterus.  Neck: Normal  range of motion. Neck supple. No tracheal deviation present. No thyromegaly present.  Cardiovascular: Normal rate, regular rhythm and normal heart sounds.  Exam reveals no gallop and no friction rub.   No murmur heard. Pulmonary/Chest: Effort normal and breath sounds normal. No stridor. No respiratory distress. She has no wheezes. She has no rales.  Musculoskeletal:       Left lower leg: She exhibits tenderness and swelling. She exhibits no bony tenderness, no edema and no deformity.       Legs: No decrease in strength with plantar flexion or dorsiflexion, however she does have pain with plantar flexion in the left calf. Unable to toe walk with left leg. Heel walking causes discomfort. She states it feels like a painful stretch.  Lymphadenopathy:    She has no cervical adenopathy.  Skin: Skin is warm and dry. She is not diaphoretic.  Vitals reviewed.       Assessment & Plan:     1. Calf pain, left We'll obtain x-ray to rule out any bony abnormality such as osteosarcoma as cause. If x-ray is normal will get MRI to rule out partial tear in the medial gastrocnemius muscle. She is to continue resting the left lower extremity, elevating the left lower extremity as much as possible, icing that area with point tenderness and anti-inflammatories as needed for pain. I will follow-up with her pending the results of her imaging. She is to call the office if symptoms fail to improve or worsen. - MR Tibia Fibula Left Wo Contrast; Future - DG Tibia/Fibula Left; Future  2. Upper respiratory infection Worsening upper respiratory infection over the last few weeks. I will treat with azithromycin as below. I did advise her to rest and stay well-hydrated. She voiced understanding. She is to call the office if symptoms fail to improve or worsen. - azithromycin (ZITHROMAX) 250 MG tablet; Take 2 tablets PO on day one, and one tablet PO daily thereafter until completed.  Dispense: 6 tablet; Refill: 0  3.  Cough She does have Tessalon pearls that was given to her by her OB/GYN. She is to take [employer] during the day for her cough suppression. She states that the [employer] have not helped her cough at night and she has had many nighttime awakenings due to her cough. I will also give her Hycodan cough syrup for her nighttime cough. She is to call the office if symptoms fail to improve or worsen. - HYDROcodone-homatropine (HYCODAN) 5-1.5 MG/5ML syrup; Take 5 mLs by mouth every 8 (eight) hours as needed for cough.  Dispense: 120 mL; Refill: 0       Mar Daring, PA-C  [facility]

## 2015-01-08 NOTE — Telephone Encounter (Signed)
They are going to require her to get an xray also and then will approve the MRI.

## 2015-01-09 ENCOUNTER — Ambulatory Visit
Admission: RE | Admit: 2015-01-09 | Discharge: 2015-01-09 | Disposition: A | Discharge status: Home / Self Care | Payer: BC Managed Care – PPO | Source: Ambulatory Visit | Attending: Physician Assistant | Admitting: Physician Assistant

## 2015-01-09 DIAGNOSIS — M79662 Pain in left lower leg: Secondary | ICD-10-CM | POA: Insufficient documentation

## 2015-01-14 ENCOUNTER — Telehealth: Payer: Self-pay | Admitting: Physician Assistant

## 2015-01-14 NOTE — Telephone Encounter (Signed)
Pt is requesting the results from X-ray.  [id number]

## 2015-01-14 NOTE — Telephone Encounter (Signed)
Please advise. Thanks.  

## 2015-01-15 NOTE — Telephone Encounter (Signed)
[employer] had closed the case on the 9th before it had been approved.  Tell her to call her insurance company and ask for an appeal and I will fill out any paperwork required to help get it approved.

## 2015-01-15 NOTE — Telephone Encounter (Signed)
LMTCB  Thanks,  -Sereniti Wan 

## 2015-01-15 NOTE — Telephone Encounter (Signed)
Patient advised as directed below. Patient wants to know if the MRI was approve. Per patient went and got the MRI done. Then they called her to let her know insurance won't cover it until patient got the Xray first. Patient just wants to know if it got approve and if not if there is something you can do to help.That would be greatly appreciated.    Thanks.

## 2015-01-15 NOTE — Telephone Encounter (Signed)
Pt returned call. Thanks TNP °

## 2015-01-15 NOTE — Telephone Encounter (Signed)
Normal xray. Only showed soft tissue edema.  No bony abnormality.

## 2015-01-16 NOTE — Telephone Encounter (Signed)
Patient advised as directed below. Voiced understanding.  Thanks,  -Joseline 

## 2015-03-25 ENCOUNTER — Encounter: Payer: Self-pay | Admitting: Family Medicine

## 2015-03-25 ENCOUNTER — Ambulatory Visit (INDEPENDENT_AMBULATORY_CARE_PROVIDER_SITE_OTHER): Payer: BC Managed Care – PPO | Admitting: Family Medicine

## 2015-03-25 ENCOUNTER — Other Ambulatory Visit: Payer: Self-pay

## 2015-03-25 VITALS — BP 118/78 | HR 82 | Temp 98.8°F | Resp 16 | Wt 225.0 lb

## 2015-03-25 DIAGNOSIS — R5381 Other malaise: Secondary | ICD-10-CM

## 2015-03-25 DIAGNOSIS — B349 Viral infection, unspecified: Secondary | ICD-10-CM

## 2015-03-25 DIAGNOSIS — R52 Pain, unspecified: Secondary | ICD-10-CM | POA: Diagnosis not present

## 2015-03-25 DIAGNOSIS — R5383 Other fatigue: Secondary | ICD-10-CM | POA: Diagnosis not present

## 2015-03-25 DIAGNOSIS — J029 Acute pharyngitis, unspecified: Secondary | ICD-10-CM | POA: Diagnosis not present

## 2015-03-25 LAB — POC INFLUENZA A&B (BINAX/QUICKVUE)
Influenza A, POC: NEGATIVE
Influenza B, POC: NEGATIVE

## 2015-03-25 LAB — POCT RAPID STREP A (OFFICE): Rapid Strep A Screen: NEGATIVE

## 2015-03-25 MED ORDER — OSELTAMIVIR PHOSPHATE 75 MG PO CAPS: 75.0000 mg | ORAL_CAPSULE | Freq: Two times a day (BID) | ORAL | Status: DC

## 2015-03-25 MED ORDER — HYDROCODONE-HOMATROPINE 5-1.5 MG/5ML PO SYRP: 5.0000 mL | ORAL_SOLUTION | Freq: Three times a day (TID) | ORAL | Status: DC | PRN

## 2015-03-25 NOTE — Patient Instructions (Signed)

## 2015-03-25 NOTE — Progress Notes (Signed)
Patient ID: Norma Wall, female   DOB: 1970/06/16, 45 y.o.   MRN: [id number]   Patient: Norma Wall Female    DOB: 12/03/1970   45 y.o.   MRN: [id number] Visit Date: 03/25/2015  Today's Provider: Vernie Murders, PA   Chief Complaint  Patient presents with  . URI   Subjective:    URI  This is a new problem. Episode onset: 2 days ago. The problem has been gradually worsening. There has been no fever. Associated symptoms include headaches, nausea, rhinorrhea and a sore throat. Pertinent negatives include no diarrhea, sneezing or vomiting. Associated symptoms comments: Fatigue, weakness, body aches without vomiting.. She has tried nothing for the symptoms.   Patient Active Problem List   Diagnosis Date Noted  . Adult BMI 30+ 11/20/2014  . Dermatitis, eczematoid 11/20/2014  . Communicable disease contact 11/20/2014  . External hemorrhoid 11/20/2014  . High potassium 11/20/2014  . Feeling faint 11/20/2014  . Awareness of heartbeats 11/20/2014  . Feeling stressed out 11/20/2014  . Bursitis 11/20/2014  . Migraine 08/28/2014  . Gallbladder polyp 09/13/2012  . Avitaminosis D 05/19/2009  . Bloodgood disease 04/29/2009  . Excess, menstruation 04/24/2008  . Current tobacco use 04/24/2008   Past Surgical History  Procedure Laterality Date  . Ablation  2012    cyst removal   Family History  Problem Relation Age of Onset  . Lung cancer Father   . Diabetes Maternal Grandmother      Previous Medications   ANUCORT-HC 25 MG SUPPOSITORY    Place 25 mg rectally as needed.    CLONAZEPAM (KLONOPIN) 0.5 MG TABLET    take 1 tablet by mouth twice a day if needed anxiety or PALPITATIONS.   DICLOFENAC (VOLTAREN) 75 MG EC TABLET    Take by mouth.   GLUCOSAMINE-VITAMIN D (GLUCOSAMINE PLUS VITAMIN D PO)    Take 1 tablet by mouth daily.   MEDROXYPROGESTERONE (DEPO-PROVERA) 150 MG/ML INJECTION    use as directed every 3 months   MULTIPLE VITAMIN (MULTIVITAMIN) TABLET    Take 1  tablet by mouth daily.   SPIRONOLACTONE (ALDACTONE) 100 MG TABLET    Take 50 mg by mouth daily.   Allergies  Allergen Reactions  . Other     Itching to cantaloupe and carrots    Review of Systems  Constitutional: Positive for fatigue.  HENT: Positive for rhinorrhea and sore throat. Negative for sneezing.   Eyes: Negative.   Respiratory: Negative.   Cardiovascular: Negative.   Gastrointestinal: Positive for nausea. Negative for vomiting and diarrhea.  Endocrine: Negative.   Genitourinary: Negative.   Musculoskeletal: Negative.   Skin: Negative.   Allergic/Immunologic: Negative.   Neurological: Positive for headaches.  Hematological: Negative.   Psychiatric/Behavioral: Negative.     Social History  Substance Use Topics  . Smoking status: Former Smoker    Types: Cigarettes    Quit date: 02/01/1997  . Smokeless tobacco: Never Used  . Alcohol Use: No   Objective:   BP 118/78 mmHg  Pulse 82  Temp(Src) 98.8 F (37.1 C) (Oral)  Resp 16  Wt 225 lb (102.059 kg)  SpO2 98%  Physical Exam  Constitutional: She is oriented to person, place, and time. She appears well-developed and well-nourished.  HENT:  Head: Normocephalic.  Right Ear: External ear normal.  Left Ear: External ear normal.  Red tonsillar pillars without exudates. Erythema around nostrils.  Eyes: Conjunctivae and EOM are normal.  Neck: Normal range of motion. Neck supple.  Cardiovascular: Normal rate,  regular rhythm and normal heart sounds.   Pulmonary/Chest: Effort normal and breath sounds normal.  Abdominal: Bowel sounds are normal.  Musculoskeletal: Normal range of motion.  Lymphadenopathy:    She has no cervical adenopathy.  Neurological: She is alert and oriented to person, place, and time.  Skin: No rash noted.  Psychiatric: She has a normal mood and affect. Her behavior is normal.      Assessment & Plan:     1. Body aches Onset over the past 2 days. Negative influenza test. May use Tylenol or  Advil prn. - POC Influenza A&B  2. Sore throat Negative strep test without fever. May gargle with saltwater prn. - POCT rapid strep A  3. Malaise and fatigue Suspect secondary to viral illness. Increase fluid intake and recheck prn.  4. Viral illness Symptoms similar to influenza. Will treat with Tamiflu and Hycodan for cough or congestion if it develops. Recheck prn. Out of work for the next 3 days. - oseltamivir (TAMIFLU) 75 MG capsule; Take 1 capsule (75 mg total) by mouth 2 (two) times daily.  Dispense: 10 capsule; Refill: 0 - HYDROcodone-homatropine (HYCODAN) 5-1.5 MG/5ML syrup; Take 5 mLs by mouth every 8 (eight) hours as needed for cough. Or congestion  Dispense: 120 mL; Refill: 0

## 2015-07-28 ENCOUNTER — Other Ambulatory Visit: Payer: Self-pay | Admitting: Family Medicine

## 2015-08-15 ENCOUNTER — Other Ambulatory Visit: Payer: Self-pay | Admitting: Obstetrics and Gynecology

## 2015-08-15 DIAGNOSIS — Z1231 Encounter for screening mammogram for malignant neoplasm of breast: Secondary | ICD-10-CM

## 2015-08-25 ENCOUNTER — Encounter: Payer: BC Managed Care – PPO | Admitting: Physician Assistant

## 2015-09-02 ENCOUNTER — Ambulatory Visit: Payer: BC Managed Care – PPO

## 2015-09-05 ENCOUNTER — Telehealth: Payer: Self-pay

## 2015-09-05 ENCOUNTER — Ambulatory Visit
Admission: RE | Admit: 2015-09-05 | Discharge: 2015-09-05 | Disposition: A | Discharge status: Home / Self Care | Payer: BC Managed Care – PPO | Source: Ambulatory Visit | Attending: Obstetrics and Gynecology | Admitting: Obstetrics and Gynecology

## 2015-09-05 DIAGNOSIS — Z1231 Encounter for screening mammogram for malignant neoplasm of breast: Secondary | ICD-10-CM

## 2015-09-05 NOTE — Telephone Encounter (Signed)
Pt advised.   Thanks,   -Laura  

## 2015-09-05 NOTE — Telephone Encounter (Signed)
-----   Message from Mar Daring, [place] sent at 09/05/2015  2:56 PM EDT ----- Normal mammogram. Repeat screening in one year.

## 2015-09-12 ENCOUNTER — Encounter: Payer: Self-pay | Admitting: Physician Assistant

## 2015-09-12 ENCOUNTER — Ambulatory Visit (INDEPENDENT_AMBULATORY_CARE_PROVIDER_SITE_OTHER): Payer: BC Managed Care – PPO | Admitting: Physician Assistant

## 2015-09-12 VITALS — BP 100/60 | HR 81 | Temp 98.7°F | Resp 16 | Ht 67.0 in | Wt 225.4 lb

## 2015-09-12 DIAGNOSIS — Z833 Family history of diabetes mellitus: Secondary | ICD-10-CM | POA: Diagnosis not present

## 2015-09-12 DIAGNOSIS — Z136 Encounter for screening for cardiovascular disorders: Secondary | ICD-10-CM

## 2015-09-12 DIAGNOSIS — Z13220 Encounter for screening for lipoid disorders: Secondary | ICD-10-CM | POA: Diagnosis not present

## 2015-09-12 DIAGNOSIS — G479 Sleep disorder, unspecified: Secondary | ICD-10-CM | POA: Diagnosis not present

## 2015-09-12 DIAGNOSIS — Z0000 Encounter for general adult medical examination without abnormal findings: Secondary | ICD-10-CM | POA: Diagnosis not present

## 2015-09-12 NOTE — Patient Instructions (Signed)
Health Maintenance, Female Adopting a healthy lifestyle and getting preventive care can go a long way to promote health and wellness. Talk with your health care provider about what schedule of regular examinations is right for you. This is a good chance for you to check in with your provider about disease prevention and staying healthy. In between checkups, there are plenty of things you can do on your own. Experts have done a lot of research about which lifestyle changes and preventive measures are most likely to keep you healthy. Ask your health care provider for more information. WEIGHT AND DIET  Eat a healthy diet  Be sure to include plenty of vegetables, fruits, low-fat dairy products, and lean protein.  Do not eat a lot of foods high in solid fats, added sugars, or salt.  Get regular exercise. This is one of the most important things you can do for your health.  Most adults should exercise for at least 150 minutes each week. The exercise should increase your heart rate and make you sweat (moderate-intensity exercise).  Most adults should also do strengthening exercises at least twice a week. This is in addition to the moderate-intensity exercise.  Maintain a healthy weight  Body mass index (BMI) is a measurement that can be used to identify possible weight problems. It estimates body fat based on height and weight. Your health care provider can help determine your BMI and help you achieve or maintain a healthy weight.  For females 20 years of age and older:   A BMI below 18.5 is considered underweight.  A BMI of 18.5 to 24.9 is normal.  A BMI of 25 to 29.9 is considered overweight.  A BMI of 30 and above is considered obese.  Watch levels of cholesterol and blood lipids  You should start having your blood tested for lipids and cholesterol at 45 years of age, then have this test every 5 years.  You may need to have your cholesterol levels checked more often if:  Your lipid  or cholesterol levels are high.  You are older than 45 years of age.  You are at high risk for heart disease.  CANCER SCREENING   Lung Cancer  Lung cancer screening is recommended for adults 55-80 years old who are at high risk for lung cancer because of a history of smoking.  A yearly low-dose CT scan of the lungs is recommended for people who:  Currently smoke.  Have quit within the past 15 years.  Have at least a 30-pack-year history of smoking. A pack year is smoking an average of one pack of cigarettes a day for 1 year.  Yearly screening should continue until it has been 15 years since you quit.  Yearly screening should stop if you develop a health problem that would prevent you from having lung cancer treatment.  Breast Cancer  Practice breast self-awareness. This means understanding how your breasts normally appear and feel.  It also means doing regular breast self-exams. Let your health care provider know about any changes, no matter how small.  If you are in your 20s or 30s, you should have a clinical breast exam (CBE) by a health care provider every 1-3 years as part of a regular health exam.  If you are 40 or older, have a CBE every year. Also consider having a breast X-ray (mammogram) every year.  If you have a family history of breast cancer, talk to your health care provider about genetic screening.  If you   are at high risk for breast cancer, talk to your health care provider about having an MRI and a mammogram every year.  Breast cancer gene (BRCA) assessment is recommended for women who have family members with BRCA-related cancers. BRCA-related cancers include:  Breast.  Ovarian.  Tubal.  Peritoneal cancers.  Results of the assessment will determine the need for genetic counseling and BRCA1 and BRCA2 testing. Cervical Cancer Your health care provider may recommend that you be screened regularly for cancer of the pelvic organs (ovaries, uterus, and  vagina). This screening involves a pelvic examination, including checking for microscopic changes to the surface of your cervix (Pap test). You may be encouraged to have this screening done every 3 years, beginning at age 21.  For women ages 30-65, health care providers may recommend pelvic exams and Pap testing every 3 years, or they may recommend the Pap and pelvic exam, combined with testing for human papilloma virus (HPV), every 5 years. Some types of HPV increase your risk of cervical cancer. Testing for HPV may also be done on women of any age with unclear Pap test results.  Other health care providers may not recommend any screening for nonpregnant women who are considered low risk for pelvic cancer and who do not have symptoms. Ask your health care provider if a screening pelvic exam is right for you.  If you have had past treatment for cervical cancer or a condition that could lead to cancer, you need Pap tests and screening for cancer for at least 20 years after your treatment. If Pap tests have been discontinued, your risk factors (such as having a new sexual partner) need to be reassessed to determine if screening should resume. Some women have medical problems that increase the chance of getting cervical cancer. In these cases, your health care provider may recommend more frequent screening and Pap tests. Colorectal Cancer  This type of cancer can be detected and often prevented.  Routine colorectal cancer screening usually begins at 45 years of age and continues through 45 years of age.  Your health care provider may recommend screening at an earlier age if you have risk factors for colon cancer.  Your health care provider may also recommend using home test kits to check for hidden blood in the stool.  A small camera at the end of a tube can be used to examine your colon directly (sigmoidoscopy or colonoscopy). This is done to check for the earliest forms of colorectal  cancer.  Routine screening usually begins at age 50.  Direct examination of the colon should be repeated every 5-10 years through 45 years of age. However, you may need to be screened more often if early forms of precancerous polyps or small growths are found. Skin Cancer  Check your skin from head to toe regularly.  Tell your health care provider about any new moles or changes in moles, especially if there is a change in a mole's shape or color.  Also tell your health care provider if you have a mole that is larger than the size of a pencil eraser.  Always use sunscreen. Apply sunscreen liberally and repeatedly throughout the day.  Protect yourself by wearing long sleeves, pants, a wide-brimmed hat, and sunglasses whenever you are outside. HEART DISEASE, DIABETES, AND HIGH BLOOD PRESSURE   High blood pressure causes heart disease and increases the risk of stroke. High blood pressure is more likely to develop in:  People who have blood pressure in the high end   of the normal range (130-139/85-89 mm Hg).  People who are overweight or obese.  People who are African American.  If you are 38-23 years of age, have your blood pressure checked every 3-5 years. If you are 61 years of age or older, have your blood pressure checked every year. You should have your blood pressure measured twice--once when you are at a hospital or clinic, and once when you are not at a hospital or clinic. Record the average of the two measurements. To check your blood pressure when you are not at a hospital or clinic, you can use:  An automated blood pressure machine at a pharmacy.  A home blood pressure monitor.  If you are between 45 years and 39 years old, ask your health care provider if you should take aspirin to prevent strokes.  Have regular diabetes screenings. This involves taking a blood sample to check your fasting blood sugar level.  If you are at a normal weight and have a low risk for diabetes,  have this test once every three years after 45 years of age.  If you are overweight and have a high risk for diabetes, consider being tested at a younger age or more often. PREVENTING INFECTION  Hepatitis B  If you have a higher risk for hepatitis B, you should be screened for this virus. You are considered at high risk for hepatitis B if:  You were born in a country where hepatitis B is common. Ask your health care provider which countries are considered high risk.  Your parents were born in a high-risk country, and you have not been immunized against hepatitis B (hepatitis B vaccine).  You have HIV or AIDS.  You use needles to inject street drugs.  You live with someone who has hepatitis B.  You have had sex with someone who has hepatitis B.  You get hemodialysis treatment.  You take certain medicines for conditions, including cancer, organ transplantation, and autoimmune conditions. Hepatitis C  Blood testing is recommended for:  Everyone born from [birth year] through 1965.  Anyone with known risk factors for hepatitis C. Sexually transmitted infections (STIs)  You should be screened for sexually transmitted infections (STIs) including gonorrhea and chlamydia if:  You are sexually active and are younger than 45 years of age.  You are older than 45 years of age and your health care provider tells you that you are at risk for this type of infection.  Your sexual activity has changed since you were last screened and you are at an increased risk for chlamydia or gonorrhea. Ask your health care provider if you are at risk.  If you do not have HIV, but are at risk, it may be recommended that you take a prescription medicine daily to prevent HIV infection. This is called pre-exposure prophylaxis (PrEP). You are considered at risk if:  You are sexually active and do not regularly use condoms or know the HIV status of your partner(s).  You take drugs by injection.  You are sexually  active with a partner who has HIV. Talk with your health care provider about whether you are at high risk of being infected with HIV. If you choose to begin PrEP, you should first be tested for HIV. You should then be tested every 3 months for as long as you are taking PrEP.  PREGNANCY   If you are premenopausal and you may become pregnant, ask your health care provider about preconception counseling.  If you may  become pregnant, take 400 to 800 micrograms (mcg) of folic acid every day.  If you want to prevent pregnancy, talk to your health care provider about birth control (contraception). OSTEOPOROSIS AND MENOPAUSE   Osteoporosis is a disease in which the bones lose minerals and strength with aging. This can result in serious bone fractures. Your risk for osteoporosis can be identified using a bone density scan.  If you are 61 years of age or older, or if you are at risk for osteoporosis and fractures, ask your health care provider if you should be screened.  Ask your health care provider whether you should take a calcium or vitamin D supplement to lower your risk for osteoporosis.  Menopause may have certain physical symptoms and risks.  Hormone replacement therapy may reduce some of these symptoms and risks. Talk to your health care provider about whether hormone replacement therapy is right for you.  HOME CARE INSTRUCTIONS   Schedule regular health, dental, and eye exams.  Stay current with your immunizations.   Do not use any tobacco products including cigarettes, chewing tobacco, or electronic cigarettes.  If you are pregnant, do not drink alcohol.  If you are breastfeeding, limit how much and how often you drink alcohol.  Limit alcohol intake to no more than 1 drink per day for nonpregnant women. One drink equals 12 ounces of beer, 5 ounces of wine, or 1 ounces of hard liquor.  Do not use street drugs.  Do not share needles.  Ask your health care provider for help if  you need support or information about quitting drugs.  Tell your health care provider if you often feel depressed.  Tell your health care provider if you have ever been abused or do not feel safe at home.   This information is not intended to replace advice given to you by your health care provider. Make sure you discuss any questions you have with your health care provider.   Document Released: 08/03/2010 Document Revised: 02/08/2014 Document Reviewed: 12/20/2012 Elsevier Interactive Patient Education [employer].

## 2015-09-12 NOTE — Progress Notes (Signed)
Patient: Norma Wall, Female    DOB: Dec 12, 1970, 45 y.o.   MRN: [id number] Visit Date: 09/12/2015  Today's Provider: Mar Daring, PA-C   Chief Complaint  Patient presents with  . Annual Exam   Subjective:    Annual physical exam Norma Wall is a 45 y.o. female who presents today for health maintenance and complete physical. She feels fairly well. She reports exercising none. She reports she is sleeping fairly well.She reports that she wakes up once at night daily but cannot "shut brain down".  Last PCP: 04/12/2014 Mammogram:09/05/15 Negative; Done in [place] Tdap: 04/06/2010 IFOBT: 04/12/14-Negative Pap Smear-[facility] -----------------------------------------------------------------   Review of Systems  Constitutional: Negative.   HENT: Negative.   Eyes: Negative.   Respiratory: Positive for shortness of breath.   Cardiovascular: Negative.        Heaviness in her chest  Gastrointestinal: Negative.   Endocrine: Negative.   Genitourinary: Negative.   Musculoskeletal: Positive for arthralgias (Finger,knees,and hip).  Skin: Negative.   Allergic/Immunologic: Negative.   Neurological: Negative.   Hematological: Negative.   Psychiatric/Behavioral: Positive for sleep disturbance.    Social History      She  reports that she quit smoking about 18 years ago. Her smoking use included Cigarettes. She has never used smokeless tobacco. She reports that she does not drink alcohol or use drugs.       Social History   Social History  . Marital status: Married    Spouse name: N/A  . Number of children: N/A  . Years of education: N/A   Social History Main Topics  . Smoking status: Former Smoker    Types: Cigarettes    Quit date: 02/01/1997  . Smokeless tobacco: Never Used  . Alcohol use No  . Drug use: No  . Sexual activity: Not Asked   Other Topics Concern  . None   Social History Narrative  . None    Past Medical  History:  Diagnosis Date  . Abdominal pain, right upper quadrant 2013  . Endometriosis 01/2013  . Gallbladder polyp   . Ovarian cyst 2014   right ovary     Patient Active Problem List   Diagnosis Date Noted  . Adult BMI 30+ 11/20/2014  . Dermatitis, eczematoid 11/20/2014  . Communicable disease contact 11/20/2014  . External hemorrhoid 11/20/2014  . High potassium 11/20/2014  . Feeling faint 11/20/2014  . Awareness of heartbeats 11/20/2014  . Feeling stressed out 11/20/2014  . Bursitis 11/20/2014  . Migraine 08/28/2014  . Gallbladder polyp 09/13/2012  . Avitaminosis D 05/19/2009  . Bloodgood disease 04/29/2009  . Excess, menstruation 04/24/2008  . Current tobacco use 04/24/2008    Past Surgical History:  Procedure Laterality Date  . ABLATION  2012   cyst removal    Family History        Family Status  Relation Status  . Father Deceased  . Mother Alive  . Brother Alive  . Maternal Grandmother         Her family history includes Diabetes in her maternal grandmother and mother; Lung cancer in her father.    Allergies  Allergen Reactions  . Other     Itching to cantaloupe and carrots    Current Meds  Medication Sig  . ANUCORT-HC 25 MG suppository Place 25 mg rectally as needed.   . clonazePAM (KLONOPIN) 0.5 MG tablet take 1 tablet by mouth twice a day if needed anxiety or PALPITATIONS.  [name] diclofenac (  VOLTAREN) 75 MG EC tablet Take by mouth.  . Glucosamine-Vitamin D (GLUCOSAMINE PLUS VITAMIN D PO) Take 1 tablet by mouth daily.  . medroxyPROGESTERone (DEPO-PROVERA) 150 MG/ML injection use as directed every 3 months  . Multiple Vitamin (MULTIVITAMIN) tablet Take 1 tablet by mouth daily.  . promethazine (PHENERGAN) 25 MG tablet take 1 tablet by mouth every 4 hours if needed for nausea and vomiting  . spironolactone (ALDACTONE) 100 MG tablet Take 50 mg by mouth daily.    Patient Care Team: Mar Daring, PA-C as PCP - General (Family Medicine) Robert Bellow, MD (General Surgery) Margarita Rana, MD as Referring Physician (Family Medicine)     Objective:   Vitals: BP 100/60 (BP Location: Left Arm, Patient Position: Sitting, Cuff Size: Normal)   Pulse 81   Temp 98.7 F (37.1 C) (Oral)   Resp 16   Ht 5\' 7"  (1.702 m)   Wt 225 lb 6.4 oz (102.2 kg)   BMI 35.30 kg/m    Physical Exam  Constitutional: She is oriented to person, place, and time. She appears well-developed and well-nourished. No distress.  HENT:  Head: Normocephalic and atraumatic.  Right Ear: Tympanic membrane, external ear and ear canal normal.  Left Ear: Tympanic membrane, external ear and ear canal normal.  Nose: Nose normal.  Mouth/Throat: Uvula is midline, oropharynx is clear and moist and mucous membranes are normal. No oropharyngeal exudate.  Eyes: Conjunctivae and EOM are normal. Pupils are equal, round, and reactive to light. Right eye exhibits no discharge. Left eye exhibits no discharge. No scleral icterus.  Neck: Normal range of motion. Neck supple. No JVD present. No tracheal deviation present. No thyromegaly present.  Cardiovascular: Normal rate, regular rhythm, normal heart sounds and intact distal pulses.  Exam reveals no gallop and no friction rub.   No murmur heard. Pulmonary/Chest: Effort normal and breath sounds normal. No respiratory distress. She has no wheezes. She has no rales. She exhibits no tenderness.  Abdominal: Soft. Bowel sounds are normal. She exhibits no distension and no mass. There is no tenderness. There is no rebound and no guarding.  Musculoskeletal: Normal range of motion. She exhibits no edema or tenderness.  Lymphadenopathy:    She has no cervical adenopathy.  Neurological: She is alert and oriented to person, place, and time.  Skin: Skin is warm and dry. No rash noted. She is not diaphoretic.  Psychiatric: She has a normal mood and affect. Her behavior is normal. Judgment and thought content normal.  Vitals  reviewed.  Depression Screen No flowsheet data found.  Assessment & Plan:     Routine Health Maintenance and Physical Exam  Exercise Activities and Dietary recommendations Goals    None      Immunization History  Administered Date(s) Administered  . Tdap 04/06/2010    Health Maintenance  Topic Date Due  . HIV Screening  01/29/1986  . PAP SMEAR  01/30/1992  . INFLUENZA VACCINE  09/02/2015  . TETANUS/TDAP  04/05/2020      Discussed health benefits of physical activity, and encouraged her to engage in regular exercise appropriate for her age and condition.   1. Annual physical exam Normal physical exam today. Will check labs as below and f/u pending lab results. If labs are stable and WNL she will not need to have these rechecked for one year at her next annual physical exam. She is to call the office in the meantime if she has any acute issue, questions or concerns. - CBC  with Differential/Platelet - Comprehensive metabolic panel - TSH  2. Family history of diabetes mellitus in mother Will check labs as below and f/u pending results. - Hemoglobin A1c  3. Encounter for lipid screening for cardiovascular disease Will check labs as below and f/u pending results. - Lipid panel  4. Difficulty sleeping Discussed different options for sleep. She does not wish for medications at this time. Discussed melatonin and that it may or may not be a good option. Discussed sleep hygiene and sleep meditation. She is going to try these first and will call if symptoms worsen.  --------------------------------------------------------------------    Mar Daring, PA-C  [facility]

## 2015-09-16 ENCOUNTER — Telehealth: Payer: Self-pay

## 2015-09-16 LAB — COMPREHENSIVE METABOLIC PANEL
ALBUMIN: 4.3 g/dL (ref 3.5–5.5)
ALT: 18 IU/L (ref 0–32)
AST: 19 IU/L (ref 0–40)
Albumin/Globulin Ratio: 1.7 (ref 1.2–2.2)
Alkaline Phosphatase: 40 IU/L (ref 39–117)
BILIRUBIN TOTAL: 0.5 mg/dL (ref 0.0–1.2)
BUN / CREAT RATIO: 10 (ref 9–23)
BUN: 10 mg/dL (ref 6–24)
CALCIUM: 9.7 mg/dL (ref 8.7–10.2)
CHLORIDE: 104 mmol/L (ref 96–106)
CO2: 21 mmol/L (ref 18–29)
Creatinine, Ser: 0.96 mg/dL (ref 0.57–1.00)
GFR, EST AFRICAN AMERICAN: 83 mL/min/{1.73_m2} (ref >59)
GFR, EST NON AFRICAN AMERICAN: 72 mL/min/{1.73_m2} (ref >59)
GLUCOSE: 79 mg/dL (ref 65–99)
Globulin, Total: 2.5 g/dL (ref 1.5–4.5)
Potassium: 5 mmol/L (ref 3.5–5.2)
Sodium: 141 mmol/L (ref 134–144)
TOTAL PROTEIN: 6.8 g/dL (ref 6.0–8.5)

## 2015-09-16 LAB — CBC WITH DIFFERENTIAL/PLATELET
BASOS ABS: 0 10*3/uL (ref 0.0–0.2)
BASOS: 1 %
EOS (ABSOLUTE): 0 10*3/uL (ref 0.0–0.4)
Eos: 1 %
HEMOGLOBIN: 13.8 g/dL (ref 11.1–15.9)
Hematocrit: 42.7 % (ref 34.0–46.6)
IMMATURE GRANS (ABS): 0 10*3/uL (ref 0.0–0.1)
Immature Granulocytes: 1 %
LYMPHS: 39 %
Lymphocytes Absolute: 1.9 10*3/uL (ref 0.7–3.1)
MCH: 29.5 pg (ref 26.6–33.0)
MCHC: 32.3 g/dL (ref 31.5–35.7)
MCV: 91 fL (ref 79–97)
MONOCYTES: 9 %
Monocytes Absolute: 0.4 10*3/uL (ref 0.1–0.9)
NEUTROS ABS: 2.6 10*3/uL (ref 1.4–7.0)
Neutrophils: 49 %
Platelets: 208 10*3/uL (ref 150–379)
RBC: 4.68 x10E6/uL (ref 3.77–5.28)
RDW: 13.2 % (ref 12.3–15.4)
WBC: 5 10*3/uL (ref 3.4–10.8)

## 2015-09-16 LAB — LIPID PANEL
CHOL/HDL RATIO: 3.1 ratio (ref 0.0–4.4)
Cholesterol, Total: 153 mg/dL (ref 100–199)
HDL: 49 mg/dL (ref >39)
LDL CALC: 86 mg/dL (ref 0–99)
TRIGLYCERIDES: 92 mg/dL (ref 0–149)
VLDL CHOLESTEROL CAL: 18 mg/dL (ref 5–40)

## 2015-09-16 LAB — HEMOGLOBIN A1C
Est. average glucose Bld gHb Est-mCnc: 100 mg/dL
Hgb A1c MFr Bld: 5.1 % (ref 4.8–5.6)

## 2015-09-16 LAB — TSH: TSH: 2.14 u[IU]/mL (ref 0.450–4.500)

## 2015-09-16 NOTE — Telephone Encounter (Signed)
Pt advised.   Thanks,   -Everhett Bozard  

## 2015-09-16 NOTE — Telephone Encounter (Signed)
-----   Message from Mar Daring, [place] sent at 09/16/2015  9:02 AM EDT ----- All labs are within normal limits and stable.  Thanks! -JB

## 2015-12-01 ENCOUNTER — Other Ambulatory Visit: Payer: Self-pay | Admitting: Family Medicine

## 2015-12-02 NOTE — Telephone Encounter (Signed)
See refill request.

## 2015-12-02 NOTE — Telephone Encounter (Signed)
rx called into [facility]

## 2015-12-03 ENCOUNTER — Ambulatory Visit (INDEPENDENT_AMBULATORY_CARE_PROVIDER_SITE_OTHER): Payer: BC Managed Care – PPO | Admitting: Physician Assistant

## 2015-12-03 ENCOUNTER — Encounter: Payer: Self-pay | Admitting: Physician Assistant

## 2015-12-03 VITALS — BP 120/80 | HR 90 | Temp 98.1°F | Resp 16 | Wt 227.4 lb

## 2015-12-03 DIAGNOSIS — M542 Cervicalgia: Secondary | ICD-10-CM

## 2015-12-03 DIAGNOSIS — G43709 Chronic migraine without aura, not intractable, without status migrainosus: Secondary | ICD-10-CM | POA: Diagnosis not present

## 2015-12-03 DIAGNOSIS — F4323 Adjustment disorder with mixed anxiety and depressed mood: Secondary | ICD-10-CM | POA: Diagnosis not present

## 2015-12-03 MED ORDER — ESCITALOPRAM OXALATE 10 MG PO TABS: ORAL_TABLET | ORAL | 1 refills | Status: DC

## 2015-12-03 MED ORDER — SUMATRIPTAN SUCCINATE 50 MG PO TABS: 50.0000 mg | ORAL_TABLET | ORAL | 3 refills | Status: DC | PRN

## 2015-12-03 NOTE — Patient Instructions (Signed)
Escitalopram tablets What is this medicine? ESCITALOPRAM (es sye TAL oh pram) is used to treat depression and certain types of anxiety. This medicine may be used for other purposes; ask your health care provider or pharmacist if you have questions. What should I tell my health care provider before I take this medicine? They need to know if you have any of these conditions: -bipolar disorder or a family history of bipolar disorder -diabetes -glaucoma -heart disease -kidney or liver disease -receiving electroconvulsive therapy -seizures (convulsions) -suicidal thoughts, plans, or attempt by you or a family member -an unusual or allergic reaction to escitalopram, the related drug citalopram, other medicines, foods, dyes, or preservatives -pregnant or trying to become pregnant -breast-feeding How should I use this medicine? Take this medicine by mouth with a glass of water. Follow the directions on the prescription label. You can take it with or without food. If it upsets your stomach, take it with food. Take your medicine at regular intervals. Do not take it more often than directed. Do not stop taking this medicine suddenly except upon the advice of your doctor. Stopping this medicine too quickly may cause serious side effects or your condition may worsen. A special MedGuide will be given to you by the pharmacist with each prescription and refill. Be sure to read this information carefully each time. Talk to your pediatrician regarding the use of this medicine in children. Special care may be needed. Overdosage: If you think you have taken too much of this medicine contact a poison control center or emergency room at once. NOTE: This medicine is only for you. Do not share this medicine with others. What if I miss a dose? If you miss a dose, take it as soon as you can. If it is almost time for your next dose, take only that dose. Do not take double or extra doses. What may interact with this  medicine? Do not take this medicine with any of the following medications: -certain medicines for fungal infections like fluconazole, itraconazole, ketoconazole, posaconazole, voriconazole -cisapride -citalopram -dofetilide -dronedarone -linezolid -MAOIs like Carbex, Eldepryl, Marplan, Nardil, and Parnate -methylene blue (injected into a vein) -pimozide -thioridazine -ziprasidone This medicine may also interact with the following medications: -alcohol -aspirin and aspirin-like medicines -carbamazepine -certain medicines for depression, anxiety, or psychotic disturbances -certain medicines for migraine headache like almotriptan, eletriptan, frovatriptan, naratriptan, rizatriptan, sumatriptan, zolmitriptan -certain medicines for sleep -certain medicines that treat or prevent blood clots like warfarin, enoxaparin, dalteparin -cimetidine -diuretics -fentanyl -furazolidone -isoniazid -lithium -metoprolol -NSAIDs, medicines for pain and inflammation, like ibuprofen or naproxen -other medicines that prolong the QT interval (cause an abnormal heart rhythm) -procarbazine -rasagiline -supplements like St. John's wort, kava kava, valerian -tramadol -tryptophan This list may not describe all possible interactions. Give your health care provider a list of all the medicines, herbs, non-prescription drugs, or dietary supplements you use. Also tell them if you smoke, drink alcohol, or use illegal drugs. Some items may interact with your medicine. What should I watch for while using this medicine? Tell your doctor if your symptoms do not get better or if they get worse. Visit your doctor or health care professional for regular checks on your progress. Because it may take several weeks to see the full effects of this medicine, it is important to continue your treatment as prescribed by your doctor. Patients and their families should watch out for new or worsening thoughts of suicide or  depression. Also watch out for sudden changes in feelings such  or health care professional for regular checks on your progress. Because it may take several weeks to see the full effects of this medicine, it is important to continue your treatment as prescribed by your doctor.  Patients and their families should watch out for new or worsening thoughts of suicide or  depression. Also watch out for sudden changes in feelings such as feeling anxious, agitated, panicky, irritable, hostile, aggressive, impulsive, severely restless, overly excited and hyperactive, or not being able to sleep. If this happens, especially at the beginning of treatment or after a change in dose, call your health care professional.  You may get drowsy or dizzy. Do not drive, use machinery, or do anything that needs mental alertness until you know how this medicine affects you. Do not stand or sit up quickly, especially if you are an older patient. This reduces the risk of dizzy or fainting spells. Alcohol may interfere with the effect of this medicine. Avoid alcoholic drinks.  Your mouth may get dry. Chewing sugarless gum or sucking hard candy, and drinking plenty of water may help. Contact your doctor if the problem does not go away or is severe.  What side effects may I notice from receiving this medicine?  Side effects that you should report to your doctor or health care professional as soon as possible:  -allergic reactions like skin rash, itching or hives, swelling of the face, lips, or tongue  -confusion  -feeling faint or lightheaded, falls  -fast talking and excited feelings or actions that are out of control  -hallucination, loss of contact with reality  -seizures  -suicidal thoughts or other mood changes  -unusual bleeding or bruising  Side effects that usually do not require medical attention (report to your doctor or health care professional if they continue or are bothersome):  -blurred vision  -changes in appetite  -change in sex drive or performance  -headache  -increased sweating  -nausea  This list may not describe all possible side effects. Call your doctor for medical advice about side effects. You may report side effects to FDA at 1-800-FDA-1088.  Where should I keep my medicine?  Keep out of reach of children.  Store at room temperature between 15 and 30 degrees C (59 and 86 degrees  F). Throw away any unused medicine after the expiration date.  NOTE: This sheet is a summary. It may not cover all possible information. If you have questions about this medicine, talk to your doctor, pharmacist, or health care provider.     © 2016, [employer]. (2012-08-15 12:32:55)

## 2015-12-03 NOTE — Progress Notes (Signed)
Patient: Norma Wall Female    DOB: 11/16/1970   45 y.o.   MRN: [id number] Visit Date: 12/03/2015  Today's Provider: Mar Daring, PA-C   Chief Complaint  Patient presents with  . Aching   Subjective:    HPI Norma Wall Is a 45 year old female that comes to the office today with multiple complaints of different aches and pains throughout her body. She also has complaints of residual headache. She had a migraine that was typical in nature over the right temporal area that started on Thursday. She had residual headache on Friday that worsened by the afternoon that required her to leave work. She rested all day Saturday and had a massage but has continued to have a dual headache along the right side of her head, behind the right ear and radiating down into the neck on the right side. She also complains of having a headache that feels like a band across her head or a tight cap.   She also has been having increasing indigestion and heartburn. She states this feels a little bit different from her regular heartburn and is radiating across the epigastric region and sometimes into the substernal region with some radiation into the left chest wall.  She does report being under some increased stress due to work. She states they are understaffed and she does have a lot on her plate. She even reports that she takes a lot of work home and is normally working 10-12 hours per day just to be able to stay afloat. She says that this puts a strain on her home life and she feels like she is not being a good mother because she is constantly working. She is interested in possibly looking for a different job since she is being so overworked and it is really starting to affect her home life and physical well-being. She also has been under increased stress due to declining health of her mother. She states she did have to take her mother to the ER a couple of weeks ago and has since had increasing  difficulty sleeping, anxieties and headaches.    Allergies  Allergen Reactions  . Other     Itching to cantaloupe and carrots     Current Outpatient Prescriptions:  .  ANUCORT-HC 25 MG suppository, Place 25 mg rectally as needed. , Disp: , Rfl: 0 .  clonazePAM (KLONOPIN) 0.5 MG tablet, take 1 tablet by mouth twice a day if needed anxiety or PALPITATIONS., Disp: 60 tablet, Rfl: 1 .  diclofenac (VOLTAREN) 75 MG EC tablet, Take by mouth., Disp: , Rfl:  .  medroxyPROGESTERone (DEPO-PROVERA) 150 MG/ML injection, use as directed every 3 months, Disp: , Rfl: 0 .  Multiple Vitamin (MULTIVITAMIN) tablet, Take 1 tablet by mouth daily., Disp: , Rfl:  .  promethazine (PHENERGAN) 25 MG tablet, take 1 tablet by mouth every 4 hours if needed for nausea and vomiting, Disp: 20 tablet, Rfl: 0 .  spironolactone (ALDACTONE) 100 MG tablet, Take 50 mg by mouth daily., Disp: , Rfl:  .  Glucosamine-Vitamin D (GLUCOSAMINE PLUS VITAMIN D PO), Take 1 tablet by mouth daily., Disp: , Rfl:   Review of Systems  Constitutional: Positive for fatigue.  Respiratory: Negative.   Cardiovascular: Positive for chest pain (left sided musculoskeletal). Negative for palpitations and leg swelling.  Gastrointestinal: Positive for abdominal pain and nausea. Negative for constipation, diarrhea and vomiting.  Neurological: Positive for headaches. Negative for dizziness, weakness, light-headedness and numbness.  Psychiatric/Behavioral: Positive for agitation, decreased concentration, dysphoric mood and sleep disturbance. Negative for self-injury and suicidal ideas. The patient is nervous/anxious.     Social History  Substance Use Topics  . Smoking status: Former Smoker    Types: Cigarettes    Quit date: 02/01/1997  . Smokeless tobacco: Never Used  . Alcohol use No   Objective:   BP 120/80 (BP Location: Right Arm, Patient Position: Sitting, Cuff Size: Normal)   Pulse 90   Temp 98.1 F (36.7 C) (Oral)   Resp 16   Wt 227 lb  6.4 oz (103.1 kg)   BMI 35.62 kg/m   Physical Exam  Constitutional: She is oriented to person, place, and time. She appears well-developed and well-nourished. No distress.  HENT:  Head: Normocephalic and atraumatic.  Right Ear: External ear normal.  Left Ear: External ear normal.  Nose: Nose normal.  Mouth/Throat: Oropharynx is clear and moist.  Eyes: Pupils are equal, round, and reactive to light.  Neck: Normal range of motion. Neck supple. No JVD present. No tracheal deviation present. No thyromegaly present.  Cardiovascular: Normal rate, regular rhythm and normal heart sounds.  Exam reveals no gallop and no friction rub.   No murmur heard. Pulmonary/Chest: Effort normal and breath sounds normal. No respiratory distress. She has no wheezes. She has no rales.  Musculoskeletal: She exhibits no edema.  Lymphadenopathy:    She has no cervical adenopathy.  Neurological: She is alert and oriented to person, place, and time. No cranial nerve deficit. Coordination normal.  Skin: She is not diaphoretic.  Psychiatric: Her speech is normal and behavior is normal. Judgment and thought content normal. Her mood appears anxious. Cognition and memory are normal. She exhibits a depressed mood.  Vitals reviewed.       Assessment & Plan:     1. Situational mixed anxiety and depressive disorder Worsening symptoms. Will prescribe Lexapro as below. She is to call the office if she has any adverse reaction to the medication or worsening symptoms. If not I will see her back in 4 weeks. - escitalopram (LEXAPRO) 10 MG tablet; Take 1/2 tab PO q h.s. X 1 week Then increase to 1 tab PO q h.s.  Dispense: 30 tablet; Refill: 1  2. Chronic migraine without aura without status migrainosus, not intractable Imitrex given as below for migraines. She has only ever use diclofenac and promethazine for migraines in the past. We'll reevaluate in 4 weeks to see if she has had a migraine and if so how the medication  works. - SUMAtriptan (IMITREX) 50 MG tablet; Take 1 tablet (50 mg total) by mouth every 2 (two) hours as needed for migraine. May repeat in 2 hours if headache persists or recurs.  Dispense: 10 tablet; Refill: 3  3. Cervicalgia Worsening neck pain with trigger point noted over her left scapula. She has had therapeutic massage without relief. Being that neck pain is worsening and migraines are increasing we'll refer her to physical therapy for evaluation and treatment of neck pain. - Ambulatory referral to Physical Therapy       Mar Daring, PA-C  [facility]

## 2015-12-23 ENCOUNTER — Ambulatory Visit: Payer: BC Managed Care – PPO | Admitting: Physical Therapy

## 2015-12-29 ENCOUNTER — Encounter: Payer: BC Managed Care – PPO | Admitting: Physical Therapy

## 2016-01-01 ENCOUNTER — Encounter: Payer: BC Managed Care – PPO | Admitting: Physical Therapy

## 2016-01-07 ENCOUNTER — Encounter: Payer: Self-pay | Admitting: Physician Assistant

## 2016-01-07 ENCOUNTER — Ambulatory Visit (INDEPENDENT_AMBULATORY_CARE_PROVIDER_SITE_OTHER): Payer: BC Managed Care – PPO | Admitting: Physician Assistant

## 2016-01-07 VITALS — BP 104/72 | HR 80 | Temp 98.6°F | Resp 16 | Wt 227.0 lb

## 2016-01-07 DIAGNOSIS — G43709 Chronic migraine without aura, not intractable, without status migrainosus: Secondary | ICD-10-CM | POA: Diagnosis not present

## 2016-01-07 DIAGNOSIS — R6882 Decreased libido: Secondary | ICD-10-CM

## 2016-01-07 DIAGNOSIS — F324 Major depressive disorder, single episode, in partial remission: Secondary | ICD-10-CM

## 2016-01-07 NOTE — Progress Notes (Signed)
Patient: Norma Wall Female    DOB: July 25, 1970   45 y.o.   MRN: [id number] Visit Date: 01/07/2016  Today's Provider: Mar Daring, PA-C   Chief Complaint  Patient presents with  . Anxiety  . Migraine   Subjective:    HPI     Follow up for Situational mixed anxiety and depressive disorder  The patient was last seen for this 4 weeks ago. Changes made at last visit include adding Lexapro 10 mg po qhs. She reports excellent compliance with treatment. She feels that condition is Improved. Pt states she is 60% improved. Pt reports she has had to take Klonopin once since LOV. She is not having side effects. Pt is c/o "sleepiness", which may be a result of husband having to wake up earlier for work. ------------------------------------------------------------------------------------    Follow up for migraine  The patient was last seen for this 4 weeks ago. Changes made at last visit include adding Imitrex 50 mg po q 2 hours PRN migaines.  She reports excellent compliance with treatment. She feels that condition is Improved. Pt states she had 1 migraine since LOV, which is normal for pt. The Imitrex did improve the pain of the migraine, but it did not resolve the migraine. She is not having side effects.  ------------------------------------------------------------------------------------  Decreased Libido Pt c/o decreased libido x several years. States this problem affects her husband more than it affects her. Pt denies pain with intercourse, denies vaginal dryness. States, "it feels like another chore at the end of the day".   Allergies  Allergen Reactions  . Other     Itching to cantaloupe and carrots     Current Outpatient Prescriptions:  .  Cholecalciferol (VITAMIN D3) 5000 units CAPS, Take by mouth., Disp: , Rfl:  .  clonazePAM (KLONOPIN) 0.5 MG tablet, take 1 tablet by mouth twice a day if needed anxiety or PALPITATIONS., Disp: 60 tablet, Rfl:  1 .  escitalopram (LEXAPRO) 10 MG tablet, Take 1/2 tab PO q h.s. X 1 week Then increase to 1 tab PO q h.s., Disp: 30 tablet, Rfl: 1 .  medroxyPROGESTERone (DEPO-PROVERA) 150 MG/ML injection, use as directed every 3 months, Disp: , Rfl: 0 .  Multiple Vitamin (MULTIVITAMIN) tablet, Take 1 tablet by mouth daily., Disp: , Rfl:  .  spironolactone (ALDACTONE) 100 MG tablet, Take 50 mg by mouth daily., Disp: , Rfl:  .  SUMAtriptan (IMITREX) 50 MG tablet, Take 1 tablet (50 mg total) by mouth every 2 (two) hours as needed for migraine. May repeat in 2 hours if headache persists or recurs., Disp: 10 tablet, Rfl: 3 .  ANUCORT-HC 25 MG suppository, Place 25 mg rectally as needed. , Disp: , Rfl: 0 .  promethazine (PHENERGAN) 25 MG tablet, take 1 tablet by mouth every 4 hours if needed for nausea and vomiting (Patient not taking: Reported on 01/07/2016), Disp: 20 tablet, Rfl: 0  Review of Systems  Constitutional: Negative for activity change, appetite change, chills, diaphoresis, fatigue, fever and unexpected weight change.  Respiratory: Negative.   Cardiovascular: Negative.   Gastrointestinal: Negative.   Genitourinary:       Decreased libido is present  Neurological: Positive for headaches.  Psychiatric/Behavioral: Positive for dysphoric mood. Negative for agitation, decreased concentration, self-injury, sleep disturbance and suicidal ideas. The patient is nervous/anxious.     Social History  Substance Use Topics  . Smoking status: Former Smoker    Types: Cigarettes    Quit date: 02/01/1997  .  Smokeless tobacco: Never Used  . Alcohol use No   Objective:   BP 104/72 (BP Location: Left Arm, Patient Position: Sitting, Cuff Size: Large)   Pulse 80   Temp 98.6 F (37 C) (Oral)   Resp 16   Wt 227 lb (103 kg)   BMI 35.55 kg/m   Physical Exam  Constitutional: She appears well-developed and well-nourished. No distress.  Neck: Normal range of motion. Neck supple. No JVD present. No tracheal deviation  present. No thyromegaly present.  Cardiovascular: Normal rate, regular rhythm and normal heart sounds.  Exam reveals no gallop and no friction rub.   No murmur heard. Pulmonary/Chest: Effort normal and breath sounds normal. No respiratory distress. She has no wheezes. She has no rales.  Lymphadenopathy:    She has no cervical adenopathy.  Skin: She is not diaphoretic.  Psychiatric: She has a normal mood and affect. Her behavior is normal. Judgment and thought content normal.  Vitals reviewed.     Assessment & Plan:     1. Major depressive disorder with single episode, in partial remission ([place]) Will continue current dose of Lexapro 10 mg as she feels this is currently working for her. She does feel with the upcoming Christmas break from school that this will also help her. She is also going to discuss discontinuing some of her medications when she sees her gynecologist at the end of the month. She is wanting to decrease her spironolactone and possibly come off her Depo-Provera injection. I will see her back in approximately 2 months to see how she is doing and to see if she is wanting to discontinue Lexapro at that time. She is to call the office if symptoms fail to improve in the meantime.  2. Chronic migraine without aura without status migrainosus, not intractable Currently stable. The Imitrex did help to take away the pain but the migraine still didn't come back and still lasted for approximately 3 days. She is wanting to continue this dose being that it didn't take away the migraine pain for a short while and was well tolerated.  3. Decreased libido This is not a new issue because she had this prior to starting Lexapro. What she has noticed is that she is having difficulty reaching climax. This is new with Lexapro. She does still want to continue Lexapro as she feels that it is helping her mood and that that is more important to her at this time. We'll discuss discontinuing Lexapro in 2  months when she returns.     Patient seen and examined by Fenton Malling, PA, and note scribed by Renaldo Fiddler, CMA.  Mar Daring, PA-C  [facility]

## 2016-01-31 ENCOUNTER — Other Ambulatory Visit: Payer: Self-pay | Admitting: Physician Assistant

## 2016-01-31 DIAGNOSIS — F4323 Adjustment disorder with mixed anxiety and depressed mood: Secondary | ICD-10-CM

## 2016-02-23 ENCOUNTER — Encounter: Payer: Self-pay | Admitting: Physician Assistant

## 2016-03-01 ENCOUNTER — Ambulatory Visit (INDEPENDENT_AMBULATORY_CARE_PROVIDER_SITE_OTHER): Payer: BC Managed Care – PPO | Admitting: Physician Assistant

## 2016-03-01 ENCOUNTER — Encounter: Payer: Self-pay | Admitting: Physician Assistant

## 2016-03-01 VITALS — BP 104/68 | HR 84 | Temp 97.7°F | Resp 16 | Wt 226.0 lb

## 2016-03-01 DIAGNOSIS — R319 Hematuria, unspecified: Secondary | ICD-10-CM

## 2016-03-01 DIAGNOSIS — R109 Unspecified abdominal pain: Secondary | ICD-10-CM | POA: Diagnosis not present

## 2016-03-01 LAB — POCT URINALYSIS DIPSTICK
Bilirubin, UA: NEGATIVE
Glucose, UA: NEGATIVE
Ketones, UA: NEGATIVE
Leukocytes, UA: NEGATIVE
Nitrite, UA: NEGATIVE
Protein, UA: NEGATIVE
Spec Grav, UA: 1.025
Urobilinogen, UA: 0.2
pH, UA: 6

## 2016-03-01 NOTE — Progress Notes (Signed)
Patient: Norma Wall Female    DOB: 12-27-1970   46 y.o.   MRN: [id number] Visit Date: 03/02/2016  Today's Provider: Trinna Post, PA-C   Chief Complaint  Patient presents with  . Abdominal Pain   Subjective:    Abdominal Pain  This is a new problem. The current episode started yesterday. Pain location: Lower abdomen. The quality of the pain is a sensation of fullness and aching. The abdominal pain does not radiate. Associated symptoms include diarrhea (Mildly after lunch; on one time) and headaches. Pertinent negatives include no anorexia, constipation, dysuria, fever, frequency, hematuria, nausea, vomiting or weight loss.   Patient is a 46 y/o woman with history of right ovarian cyst removal, uterine ablation for menorrhagia, and gallbladder polyp presenting today for abdominal pain. The pain is located in her lower abdomen. It started yesterday and it was sharp. Now it has improved but has spread across her lower abdomen and she describes it as a fullness. She had one episode of diarrhea, which she thinks is related to eating from a takeout place that normally upsets her stomach. No blood in her stool, no diarrhea since. Before that, was straining to have small bowel movements. Not nauseas or vomiting, no fever. No radiation of pain, pain does not extend to flank. Patient also thinks it hurts in her RUQ. No urinary symptoms. No vaginal bleeding. Patient very concerned her intestines are inflamed.    Allergies  Allergen Reactions  . Other     Itching to cantaloupe and carrots     Current Outpatient Prescriptions:  .  ANUCORT-HC 25 MG suppository, Place 25 mg rectally as needed. , Disp: , Rfl: 0 .  Cholecalciferol (VITAMIN D3) 5000 units CAPS, Take by mouth., Disp: , Rfl:  .  clonazePAM (KLONOPIN) 0.5 MG tablet, take 1 tablet by mouth twice a day if needed anxiety or PALPITATIONS., Disp: 60 tablet, Rfl: 1 .  medroxyPROGESTERone (DEPO-PROVERA) 150 MG/ML injection,  use as directed every 3 months, Disp: , Rfl: 0 .  Multiple Vitamin (MULTIVITAMIN) tablet, Take 1 tablet by mouth daily., Disp: , Rfl:  .  promethazine (PHENERGAN) 25 MG tablet, take 1 tablet by mouth every 4 hours if needed for nausea and vomiting, Disp: 20 tablet, Rfl: 0 .  spironolactone (ALDACTONE) 100 MG tablet, Take 50 mg by mouth daily., Disp: , Rfl:  .  SUMAtriptan (IMITREX) 50 MG tablet, Take 1 tablet (50 mg total) by mouth every 2 (two) hours as needed for migraine. May repeat in 2 hours if headache persists or recurs., Disp: 10 tablet, Rfl: 3 .  escitalopram (LEXAPRO) 10 MG tablet, Take 1 tablet (10 mg total) by mouth at bedtime. (Patient not taking: Reported on 03/01/2016), Disp: 30 tablet, Rfl: 5  Review of Systems  Constitutional: Positive for chills and fatigue. Negative for activity change, appetite change, diaphoresis, fever, unexpected weight change and weight loss.  Gastrointestinal: Positive for abdominal distention, abdominal pain and diarrhea (Mildly after lunch; on one time). Negative for anal bleeding, anorexia, blood in stool, constipation, nausea, rectal pain and vomiting.  Genitourinary: Negative for decreased urine volume, difficulty urinating, dyspareunia, dysuria, frequency, hematuria, urgency, vaginal bleeding, vaginal discharge and vaginal pain.  Neurological: Positive for light-headedness and headaches. Negative for dizziness.    Social History  Substance Use Topics  . Smoking status: Former Smoker    Types: Cigarettes    Quit date: 02/01/1997  . Smokeless tobacco: Never Used  . Alcohol use No  Objective:   BP 104/68 (BP Location: Left Arm, Patient Position: Sitting, Cuff Size: Large)   Pulse 84   Temp 97.7 F (36.5 C) (Oral)   Resp 16   Wt 226 lb (102.5 kg)   BMI 35.40 kg/m   Physical Exam  Constitutional: She is oriented to person, place, and time. She appears well-developed and well-nourished. No distress.  Cardiovascular: Normal rate and regular  rhythm.   Pulmonary/Chest: Effort normal and breath sounds normal.  Abdominal: Soft. Bowel sounds are normal. She exhibits no distension, no fluid wave, no ascites and no mass. There is no hepatosplenomegaly. There is no tenderness. There is no rigidity, no rebound, no guarding, no CVA tenderness, no tenderness at McBurney's point and negative Murphy's sign.  Neurological: She is alert and oriented to person, place, and time.  Skin: Skin is warm and dry.  Psychiatric: She has a normal mood and affect. Her behavior is normal.        Assessment & Plan:     1. Abdominal pain, unspecified abdominal location  Unclear etiology. Symptoms are a bit vague and patient has history of various abdominal pains. DDX: constipation, cystitis, MSK pain. Lower suspicion but on differential: ovarian cyst, pregnancy, diverticulitis, pancreatitis. Evaluate and treat as below. Advised patient to increase fluids, fiber and other constipation precautions.  - Urine Culture - POCT urinalysis dipstick - CBC with Differential/Platelet - Comprehensive metabolic panel - Lipase - Amylase - B-HCG Quant  2. Hematuria, unspecified type  See above.   - Urine Culture  Return if symptoms worsen or fail to improve.  Patient Instructions  Abdominal Pain, Adult Many things can cause belly (abdominal) pain. Most times, belly pain is not dangerous. Many cases of belly pain can be watched and treated at home. Sometimes belly pain is serious, though. Your doctor will try to find the cause of your belly pain. Follow these instructions at home:  Take over-the-counter and prescription medicines only as told by your doctor. Do not take medicines that help you poop (laxatives) unless told to by your doctor.  Drink enough fluid to keep your pee (urine) clear or pale yellow.  Watch your belly pain for any changes.  Keep all follow-up visits as told by your doctor. This is important. Contact a doctor if:  Your belly pain  changes or gets worse.  You are not hungry, or you lose weight without trying.  You are having trouble pooping (constipated) or have watery poop (diarrhea) for more than 2-3 days.  You have pain when you pee or poop.  Your belly pain wakes you up at night.  Your pain gets worse with meals, after eating, or with certain foods.  You are throwing up and cannot keep anything down.  You have a fever. Get help right away if:  Your pain does not go away as soon as your doctor says it should.  You cannot stop throwing up.  Your pain is only in areas of your belly, such as the right side or the left lower part of the belly.  You have bloody or black poop, or poop that looks like tar.  You have very bad pain, cramping, or bloating in your belly.  You have signs of not having enough fluid or water in your body (dehydration), such as:  Dark pee, very little pee, or no pee.  Cracked lips.  Dry mouth.  Sunken eyes.  Sleepiness.  Weakness. This information is not intended to replace advice given to you by  your health care provider. Make sure you discuss any questions you have with your health care provider. Document Released: 07/07/2007 Document Revised: 08/08/2015 Document Reviewed: 07/02/2015 Elsevier Interactive Patient Education  2017 [employer].    The entirety of the information documented in the History of Present Illness, Review of Systems and Physical Exam were personally obtained by me. Portions of this information were initially documented by Ashley Royalty, CMA and reviewed by me for thoroughness and accuracy.         Trinna Post, PA-C  [facility]

## 2016-03-02 NOTE — Patient Instructions (Signed)

## 2016-03-03 LAB — CBC WITH DIFFERENTIAL/PLATELET
Basophils Absolute: 0 10*3/uL (ref 0.0–0.2)
Basos: 1 %
EOS (ABSOLUTE): 0.1 10*3/uL (ref 0.0–0.4)
Eos: 1 %
Hematocrit: 40.6 % (ref 34.0–46.6)
Hemoglobin: 13.7 g/dL (ref 11.1–15.9)
Immature Grans (Abs): 0 10*3/uL (ref 0.0–0.1)
Immature Granulocytes: 0 %
Lymphocytes Absolute: 1.6 10*3/uL (ref 0.7–3.1)
Lymphs: 32 %
MCH: 29.5 pg (ref 26.6–33.0)
MCHC: 33.7 g/dL (ref 31.5–35.7)
MCV: 87 fL (ref 79–97)
Monocytes Absolute: 0.5 10*3/uL (ref 0.1–0.9)
Monocytes: 9 %
Neutrophils Absolute: 2.8 10*3/uL (ref 1.4–7.0)
Neutrophils: 57 %
Platelets: 203 10*3/uL (ref 150–379)
RBC: 4.65 x10E6/uL (ref 3.77–5.28)
RDW: 13 % (ref 12.3–15.4)
WBC: 5 10*3/uL (ref 3.4–10.8)

## 2016-03-03 LAB — COMPREHENSIVE METABOLIC PANEL
ALT: 21 IU/L (ref 0–32)
AST: 18 IU/L (ref 0–40)
Albumin/Globulin Ratio: 1.7 (ref 1.2–2.2)
Albumin: 4.3 g/dL (ref 3.5–5.5)
Alkaline Phosphatase: 45 IU/L (ref 39–117)
BUN/Creatinine Ratio: 11 (ref 9–23)
BUN: 10 mg/dL (ref 6–24)
Bilirubin Total: 0.6 mg/dL (ref 0.0–1.2)
CO2: 22 mmol/L (ref 18–29)
Calcium: 9.2 mg/dL (ref 8.7–10.2)
Chloride: 106 mmol/L (ref 96–106)
Creatinine, Ser: 0.9 mg/dL (ref 0.57–1.00)
GFR calc Af Amer: 89 mL/min/{1.73_m2} (ref >59)
GFR calc non Af Amer: 77 mL/min/{1.73_m2} (ref >59)
Globulin, Total: 2.6 g/dL (ref 1.5–4.5)
Glucose: 88 mg/dL (ref 65–99)
Potassium: 4.3 mmol/L (ref 3.5–5.2)
Sodium: 142 mmol/L (ref 134–144)
Total Protein: 6.9 g/dL (ref 6.0–8.5)

## 2016-03-03 LAB — AMYLASE: Amylase: 76 U/L (ref 31–124)

## 2016-03-03 LAB — URINE CULTURE: Organism ID, Bacteria: NO GROWTH

## 2016-03-03 LAB — LIPASE: Lipase: 31 U/L (ref 14–72)

## 2016-03-03 LAB — PLEASE NOTE

## 2016-03-04 ENCOUNTER — Other Ambulatory Visit: Payer: Self-pay | Admitting: Family Medicine

## 2016-03-04 NOTE — Telephone Encounter (Signed)
Please review-aa 

## 2016-03-24 ENCOUNTER — Encounter: Payer: Self-pay | Admitting: Physician Assistant

## 2016-03-24 ENCOUNTER — Ambulatory Visit (INDEPENDENT_AMBULATORY_CARE_PROVIDER_SITE_OTHER): Payer: BC Managed Care – PPO | Admitting: Physician Assistant

## 2016-03-24 VITALS — BP 100/60 | HR 71 | Temp 98.2°F | Resp 16 | Wt 224.4 lb

## 2016-03-24 DIAGNOSIS — G43709 Chronic migraine without aura, not intractable, without status migrainosus: Secondary | ICD-10-CM

## 2016-03-24 DIAGNOSIS — F419 Anxiety disorder, unspecified: Secondary | ICD-10-CM | POA: Diagnosis not present

## 2016-03-24 NOTE — Patient Instructions (Signed)
Can use magnesium oxide or magnesium sulfate 500mg  daily for migraine prophylaxis.   Migraine Headache A migraine headache is an intense, throbbing pain on one side or both sides of the head. Migraines may also cause other symptoms, such as nausea, vomiting, and sensitivity to light and noise. What are the causes? Doing or taking certain things may also trigger migraines, such as:  Alcohol.  Smoking.  Medicines, such as:  Medicine used to treat chest pain (nitroglycerine).  Birth control pills.  Estrogen pills.  Certain blood pressure medicines.  Aged cheeses, chocolate, or caffeine.  Foods or drinks that contain nitrates, glutamate, aspartame, or tyramine.  Physical activity. Other things that may trigger a migraine include:  Menstruation.  Pregnancy.  Hunger.  Stress, lack of sleep, too much sleep, or fatigue.  Weather changes. What increases the risk? The following factors may make you more likely to experience migraine headaches:  Age. Risk increases with age.  Family history of migraine headaches.  Being Caucasian.  Depression and anxiety.  Obesity.  Being a woman.  Having a hole in the heart (patent foramen ovale) or other heart problems. What are the signs or symptoms? The main symptom of this condition is pulsating or throbbing pain. Pain may:  Happen in any area of the head, such as on one side or both sides.  Interfere with daily activities.  Get worse with physical activity.  Get worse with exposure to bright lights or loud noises. Other symptoms may include:  Nausea.  Vomiting.  Dizziness.  General sensitivity to bright lights, loud noises, or smells. Before you get a migraine, you may get warning signs that a migraine is developing (aura). An aura may include:  Seeing flashing lights or having blind spots.  Seeing bright spots, halos, or zigzag lines.  Having tunnel vision or blurred vision.  Having numbness or a tingling  feeling.  Having trouble talking.  Having muscle weakness. How is this diagnosed? A migraine headache can be diagnosed based on:  Your symptoms.  A physical exam.  Tests, such as CT scan or MRI of the head. These imaging tests can help rule out other causes of headaches.  Taking fluid from the spine (lumbar puncture) and analyzing it (cerebrospinal fluid analysis, or CSF analysis). How is this treated? A migraine headache is usually treated with medicines that:  Relieve pain.  Relieve nausea.  Prevent migraines from coming back. Treatment may also include:  Acupuncture.  Lifestyle changes like avoiding foods that trigger migraines. Follow these instructions at home: Medicines  Take over-the-counter and prescription medicines only as told by your health care provider.  Do not drive or use heavy machinery while taking prescription pain medicine.  To prevent or treat constipation while you are taking prescription pain medicine, your health care provider may recommend that you:  Drink enough fluid to keep your urine clear or pale yellow.  Take over-the-counter or prescription medicines.  Eat foods that are high in fiber, such as fresh fruits and vegetables, whole grains, and beans.  Limit foods that are high in fat and processed sugars, such as fried and sweet foods. Lifestyle  Avoid alcohol use.  Do not use any products that contain nicotine or tobacco, such as cigarettes and e-cigarettes. If you need help quitting, ask your health care provider.  Get at least 8 hours of sleep every night.  Limit your stress. General instructions  Keep a journal to find out what may trigger your migraine headaches. For example, write down:  What you eat and drink.  How much sleep you get.  Any change to your diet or medicines.  If you have a migraine:  Avoid things that make your symptoms worse, such as bright lights.  It may help to lie down in a dark, quiet  room.  Do not drive or use heavy machinery.  Ask your health care provider what activities are safe for you while you are experiencing symptoms.  Keep all follow-up visits as told by your health care provider. This is important. Contact a health care provider if:  You develop symptoms that are different or more severe than your usual migraine symptoms. Get help right away if:  Your migraine becomes severe.  You have a fever.  You have a stiff neck.  You have vision loss.  Your muscles feel weak or like you cannot control them.  You start to lose your balance often.  You develop trouble walking.  You faint. This information is not intended to replace advice given to you by your health care provider. Make sure you discuss any questions you have with your health care provider. Document Released: 01/18/2005 Document Revised: 08/08/2015 Document Reviewed: 07/07/2015 Elsevier Interactive Patient Education  2017 [employer]    Hematoma A hematoma is a collection of blood under the skin, in an organ, in a body space, in a joint space, or in other tissue. The blood can clot to form a lump that you can see and feel. The lump is often firm and may sometimes become sore and tender. Most hematomas get better in a few days to weeks. However, some hematomas may be serious and require medical care. Hematomas can range in size from very small to very large. What are the causes? A hematoma can be caused by a blunt or penetrating injury. It can also be caused by spontaneous leakage from a blood vessel under the skin. Spontaneous leakage from a blood vessel is more likely to occur in older people, especially those taking blood thinners. Sometimes, a hematoma can develop after certain medical procedures. What are the signs or symptoms?  A firm lump on the body.  Possible pain and tenderness in the area.  Bruising.Blue, dark blue, purple-red, or yellowish skin may appear at the site of the  hematoma if the hematoma is close to the surface of the skin. For hematomas in deeper tissues or body spaces, the signs and symptoms may be subtle. For example, an intra-abdominal hematoma may cause abdominal pain, weakness, fainting, and shortness of breath. An intracranial hematoma may cause a headache or symptoms such as weakness, trouble speaking, or a change in consciousness. How is this diagnosed? A hematoma can usually be diagnosed based on your medical history and a physical exam. Imaging tests may be needed if your health care provider suspects a hematoma in deeper tissues or body spaces, such as the abdomen, head, or chest. These tests may include ultrasonography or a CT scan. How is this treated? Hematomas usually go away on their own over time. Rarely does the blood need to be drained out of the body. Large hematomas or those that may affect vital organs will sometimes need surgical drainage or monitoring. Follow these instructions at home:  Apply ice to the injured area:  Put ice in a plastic bag.  Place a towel between your skin and the bag.  Leave the ice on for 20 minutes, 2-3 times a day for the first 1 to 2 days.  After the first 2  days, switch to using warm compresses on the hematoma.  Elevate the injured area to help decrease pain and swelling. Wrapping the area with an elastic bandage may also be helpful. Compression helps to reduce swelling and promotes shrinking of the hematoma. Make sure the bandage is not wrapped too tight.  If your hematoma is on a lower extremity and is painful, crutches may be helpful for a couple days.  Only take over-the-counter or prescription medicines as directed by your health care provider. Get help right away if:  You have increasing pain, or your pain is not controlled with medicine.  You have a fever.  You have worsening swelling or discoloration.  Your skin over the hematoma breaks or starts bleeding.  Your hematoma is in your  chest or abdomen and you have weakness, shortness of breath, or a change in consciousness.  Your hematoma is on your scalp (caused by a fall or injury) and you have a worsening headache or a change in alertness or consciousness. This information is not intended to replace advice given to you by your health care provider. Make sure you discuss any questions you have with your health care provider. Document Released: 09/02/2003 Document Revised: 06/26/2015 Document Reviewed: 06/28/2012 Elsevier Interactive Patient Education  2017 [employer].

## 2016-03-24 NOTE — Progress Notes (Signed)
Patient: Norma Wall Female    DOB: [birth year]   46 y.o.   MRN: [id number] Visit Date: 03/24/2016  Today's Provider: Mar Daring, PA-C   Chief Complaint  Patient presents with  . Follow-up    anxiety and Migraine   Subjective:    HPI Situational mixed anxiety and depressive disorder, follow-up: Patient was started on Lexapro, but patient is not taking it due to the side effect she was having of inability to orgasm. She reports she is coping decently at this time. She feels like she is just giving up on things. Like "giving up on her work quality." She states she is getting her basic things done and taking one day at a time, but is not pressuring herself to do all the "extra" things they ask of her at her school.   Chronic migraine, follow-up: Patient was prescribed Imitrex to help with her migraines. She feels that when she takes the Imitrex it helps with her migraine. "It makes it go away, but it doesn't make the migraine episode go away." The pain will go away, but then return and she will take another imitrex. She states each episode last about 3 days. She reports getting 1-2 per month. Does not think it is severe enough to start a daily preventive medication like topamax.     Allergies  Allergen Reactions  . Other     Itching to cantaloupe and carrots     Current Outpatient Prescriptions:  .  ANUCORT-HC 25 MG suppository, unwrap and insert 1 suppository rectally twice a day, Disp: 10 suppository, Rfl: 5 .  Cholecalciferol (VITAMIN D3) 5000 units CAPS, Take by mouth., Disp: , Rfl:  .  clonazePAM (KLONOPIN) 0.5 MG tablet, take 1 tablet by mouth twice a day if needed anxiety or PALPITATIONS., Disp: 60 tablet, Rfl: 1 .  medroxyPROGESTERone (DEPO-PROVERA) 150 MG/ML injection, use as directed every 3 months, Disp: , Rfl: 0 .  Multiple Vitamin (MULTIVITAMIN) tablet, Take 1 tablet by mouth daily., Disp: , Rfl:  .  promethazine (PHENERGAN) 25 MG tablet, take 1  tablet by mouth every 4 hours if needed for nausea and vomiting, Disp: 20 tablet, Rfl: 0 .  spironolactone (ALDACTONE) 100 MG tablet, Take 50 mg by mouth daily., Disp: , Rfl:  .  SUMAtriptan (IMITREX) 50 MG tablet, Take 1 tablet (50 mg total) by mouth every 2 (two) hours as needed for migraine. May repeat in 2 hours if headache persists or recurs., Disp: 10 tablet, Rfl: 3 .  escitalopram (LEXAPRO) 10 MG tablet, Take 1 tablet (10 mg total) by mouth at bedtime. (Patient not taking: Reported on 03/01/2016), Disp: 30 tablet, Rfl: 5  Review of Systems  Constitutional: Negative.   Respiratory: Negative.   Cardiovascular: Negative.   Gastrointestinal: Negative.   Neurological: Positive for headaches. Negative for dizziness.  Psychiatric/Behavioral: Positive for dysphoric mood (coping better). The patient is not nervous/anxious.     Social History  Substance Use Topics  . Smoking status: Former Smoker    Types: Cigarettes    Quit date: 02/01/1997  . Smokeless tobacco: Never Used  . Alcohol use No   Objective:   BP 100/60 (BP Location: Right Arm, Patient Position: Sitting, Cuff Size: Normal)   Pulse 71   Temp 98.2 F (36.8 C) (Oral)   Resp 16   Wt 224 lb 6.4 oz (101.8 kg)   BMI 35.15 kg/m   Physical Exam  Constitutional: She appears well-developed and well-nourished. No  distress.  Neck: Normal range of motion. Neck supple.  Cardiovascular: Normal rate, regular rhythm and normal heart sounds.  Exam reveals no gallop and no friction rub.   No murmur heard. Pulmonary/Chest: Effort normal and breath sounds normal. No respiratory distress. She has no wheezes. She has no rales.  Skin: She is not diaphoretic.  Psychiatric: She has a normal mood and affect. Her behavior is normal. Judgment and thought content normal.  Vitals reviewed.     Assessment & Plan:     1. Chronic migraine without aura without status migrainosus, not intractable Stable. Continue Imitrex. She is going to try  magnesium oxide 500mg  daily to see if this helps prevent onset of new migraines. Also discussed myheadachebuddy application to track. I will see her back in 6 months for her CPE. She is to call if she has any issues in the meantime.  2. Anxiety Stable without medication at this time.        Mar Daring, PA-C  [facility]

## 2016-05-04 ENCOUNTER — Ambulatory Visit (INDEPENDENT_AMBULATORY_CARE_PROVIDER_SITE_OTHER): Payer: BC Managed Care – PPO

## 2016-05-04 DIAGNOSIS — Z3040 Encounter for surveillance of contraceptives, unspecified: Secondary | ICD-10-CM

## 2016-05-04 DIAGNOSIS — Z3042 Encounter for surveillance of injectable contraceptive: Secondary | ICD-10-CM

## 2016-05-04 MED ORDER — MEDROXYPROGESTERONE ACETATE 150 MG/ML IM SUSP
150.0000 mg | Freq: Once | INTRAMUSCULAR | Status: AC
  Administered 2016-05-04: 150 mg via INTRAMUSCULAR

## 2016-05-04 NOTE — Progress Notes (Signed)
Last injection given 02/16/16 per Kerby Nora.

## 2016-05-28 ENCOUNTER — Other Ambulatory Visit: Payer: Self-pay | Admitting: Physician Assistant

## 2016-05-28 DIAGNOSIS — G43709 Chronic migraine without aura, not intractable, without status migrainosus: Secondary | ICD-10-CM

## 2016-07-19 ENCOUNTER — Ambulatory Visit: Payer: BC Managed Care – PPO

## 2016-07-21 ENCOUNTER — Ambulatory Visit (INDEPENDENT_AMBULATORY_CARE_PROVIDER_SITE_OTHER): Payer: BC Managed Care – PPO

## 2016-07-21 DIAGNOSIS — Z308 Encounter for other contraceptive management: Secondary | ICD-10-CM | POA: Diagnosis not present

## 2016-07-21 MED ORDER — MEDROXYPROGESTERONE ACETATE 150 MG/ML IM SUSP
150.0000 mg | Freq: Once | INTRAMUSCULAR | Status: AC
  Administered 2016-07-21: 150 mg via INTRAMUSCULAR

## 2016-07-21 NOTE — Progress Notes (Signed)
Pt here for depo inj which was given IM right glut.  NDC# [address]-2 

## 2016-07-29 DIAGNOSIS — K080 Exfoliation of teeth due to systemic causes: Secondary | ICD-10-CM | POA: Diagnosis not present

## 2016-07-30 ENCOUNTER — Ambulatory Visit (INDEPENDENT_AMBULATORY_CARE_PROVIDER_SITE_OTHER): Payer: BC Managed Care – PPO | Admitting: Physician Assistant

## 2016-07-30 VITALS — BP 104/66 | HR 60 | Temp 98.3°F | Resp 12 | Wt 226.0 lb

## 2016-07-30 DIAGNOSIS — K649 Unspecified hemorrhoids: Secondary | ICD-10-CM | POA: Diagnosis not present

## 2016-07-30 MED ORDER — HYDROCORTISONE ACETATE 25 MG RE SUPP: RECTAL | 5 refills | Status: DC

## 2016-07-30 NOTE — Progress Notes (Signed)
Patient: Norma Wall Female    DOB: 1970-10-20   46 y.o.   MRN: [id number] Visit Date: 07/30/2016  Today's Provider: Mar Daring, PA-C   Chief Complaint  Patient presents with  . Anal Itching   Subjective:    HPI  Patient is here to discuss anal itching. Patient states she has history of recurrent hemorrhoids that has been present since she went through pregnancy. She also reports that she also had a rectal prolapse during the birth of her son. She does report that she also used to have it happen occasionally during straining with her BM and she would have to push it back in with her finger. She reports she has not had any issues with rectal prolapse since starting the magnesium as this has made her BM more regular. She usually uses preparation H, Hydrocortisone cream and diaper cream (A+D cream) as needed. She usually gets itching that will start and last for a week or so and improve with treatment, but this time its been months. She does report she had 2 days of improvement at the end of May but symptoms quickly returned and have not improved. It is interrupting her sleep. No blood in the stool unless she has to strain with bowel movements. No nausea or vomiting. She has not been camping, does not work with livestock, has not had unfiltered water. They do own a dog but he is up to date on vaccinations and is treated for heartworms monthly.   Allergies  Allergen Reactions  . Other     Itching to cantaloupe and carrots     Current Outpatient Prescriptions:  .  Cholecalciferol (VITAMIN D3) 5000 units CAPS, Take by mouth., Disp: , Rfl:  .  clonazePAM (KLONOPIN) 0.5 MG tablet, take 1 tablet by mouth twice a day if needed anxiety or PALPITATIONS., Disp: 60 tablet, Rfl: 1 .  magnesium 30 MG tablet, Take 30 mg by mouth daily., Disp: , Rfl:  .  medroxyPROGESTERone (DEPO-PROVERA) 150 MG/ML injection, use as directed every 3 months, Disp: , Rfl: 0 .  Multiple Vitamin  (MULTIVITAMIN) tablet, Take 1 tablet by mouth daily., Disp: , Rfl:  .  promethazine (PHENERGAN) 25 MG tablet, take 1 tablet by mouth every 4 hours if needed for nausea and vomiting, Disp: 20 tablet, Rfl: 0 .  spironolactone (ALDACTONE) 100 MG tablet, Take 50 mg by mouth daily., Disp: , Rfl:  .  SUMAtriptan (IMITREX) 50 MG tablet, take 1 tablet by mouth every 2 hours if needed for migraines, Disp: 10 tablet, Rfl: 3 .  ANUCORT-HC 25 MG suppository, unwrap and insert 1 suppository rectally twice a day (Patient not taking: Reported on 07/30/2016), Disp: 10 suppository, Rfl: 5  Review of Systems  Constitutional: Negative.   Respiratory: Negative.   Cardiovascular: Negative.   Gastrointestinal: Positive for abdominal pain (chronic right side per patient). Negative for abdominal distention, anal bleeding, blood in stool, constipation, diarrhea, nausea, rectal pain and vomiting.       Anal itching  Genitourinary: Negative for vaginal bleeding, vaginal discharge and vaginal pain.    Social History  Substance Use Topics  . Smoking status: Former Smoker    Types: Cigarettes    Quit date: 02/01/1997  . Smokeless tobacco: Never Used  . Alcohol use No   Objective:   BP 104/66   Pulse 60   Temp 98.3 F (36.8 C)   Resp 12   Wt 226 lb (102.5 kg)  BMI 35.40 kg/m  Vitals:   07/30/16 0842  BP: 104/66  Pulse: 60  Resp: 12  Temp: 98.3 F (36.8 C)  Weight: 226 lb (102.5 kg)     Physical Exam  Constitutional: She is oriented to person, place, and time. She appears well-developed and well-nourished. No distress.  Cardiovascular: Normal rate, regular rhythm and normal heart sounds.  Exam reveals no gallop and no friction rub.   No murmur heard. Pulmonary/Chest: Effort normal and breath sounds normal. No respiratory distress. She has no wheezes. She has no rales.  Abdominal: Soft. Normal appearance and bowel sounds are normal. She exhibits no distension and no mass. There is no hepatosplenomegaly.  There is tenderness (chronic and not new) in the right upper quadrant. There is no rebound, no guarding and no CVA tenderness.  Genitourinary: Vagina normal. Rectal exam shows external hemorrhoid. Rectal exam shows no fissure, no mass, no tenderness and anal tone normal. There is no rash, tenderness, lesion or injury on the right labia. There is no rash, tenderness, lesion or injury on the left labia. No erythema or tenderness in the vagina. No vaginal discharge found.  Genitourinary Comments: External hemorrhoid noted in the 6 o'clock position, not inflamed, not thrmobosed  Skin irritation noted around anus   Neurological: She is alert and oriented to person, place, and time.  Skin: Skin is warm and dry. She is not diaphoretic.  Vitals reviewed.       Assessment & Plan:     1. Hemorrhoids, unspecified hemorrhoid type Discussed using Sitz baths as she has not been doing this yet and will refill Anusol suppositories as below. She may also continue conservative management as she has been doing. Referral was made to general surgery, Dr. Bary Castilla, as below for further evaluation and possible treatment considerations. She is to call if symptoms worsen in the meantime.  - hydrocortisone (ANUCORT-HC) 25 MG suppository; unwrap and insert 1 suppository rectally twice a day  Dispense: 10 suppository; Refill: 5 - Ambulatory referral to [place], PA-C  [facility]

## 2016-07-30 NOTE — Patient Instructions (Signed)
Hemorrhoids Hemorrhoids are swollen veins in and around the rectum or anus. There are two types of hemorrhoids:  Internal hemorrhoids. These occur in the veins that are just inside the rectum. They may poke through to the outside and become irritated and painful.  External hemorrhoids. These occur in the veins that are outside of the anus and can be felt as a painful swelling or hard lump near the anus.  Most hemorrhoids do not cause serious problems, and they can be managed with home treatments such as diet and lifestyle changes. If home treatments do not help your symptoms, procedures can be done to shrink or remove the hemorrhoids. What are the causes? This condition is caused by increased pressure in the anal area. This pressure may result from various things, including:  Constipation.  Straining to have a bowel movement.  Diarrhea.  Pregnancy.  Obesity.  Sitting for long periods of time.  Heavy lifting or other activity that causes you to strain.  Anal sex.  What are the signs or symptoms? Symptoms of this condition include:  Pain.  Anal itching or irritation.  Rectal bleeding.  Leakage of stool (feces).  Anal swelling.  One or more lumps around the anus.  How is this diagnosed? This condition can often be diagnosed through a visual exam. Other exams or tests may also be done, such as:  Examination of the rectal area with a gloved hand (digital rectal exam).  Examination of the anal canal using a small tube (anoscope).  A blood test, if you have lost a significant amount of blood.  A test to look inside the colon (sigmoidoscopy or colonoscopy).  How is this treated? This condition can usually be treated at home. However, various procedures may be done if dietary changes, lifestyle changes, and other home treatments do not help your symptoms. These procedures can help make the hemorrhoids smaller or remove them completely. Some of these procedures involve  surgery, and others do not. Common procedures include:  Rubber band ligation. Rubber bands are placed at the base of the hemorrhoids to cut off the blood supply to them.  Sclerotherapy. Medicine is injected into the hemorrhoids to shrink them.  Infrared coagulation. A type of light energy is used to get rid of the hemorrhoids.  Hemorrhoidectomy surgery. The hemorrhoids are surgically removed, and the veins that supply them are tied off.  Stapled hemorrhoidopexy surgery. A circular stapling device is used to remove the hemorrhoids and use staples to cut off the blood supply to them.  Follow these instructions at home: Eating and drinking  Eat foods that have a lot of fiber in them, such as whole grains, beans, nuts, fruits, and vegetables. Ask your health care provider about taking products that have added fiber (fiber supplements).  Drink enough fluid to keep your urine clear or pale yellow. Managing pain and swelling  Take warm sitz baths for 20 minutes, 3-4 times a day to ease pain and discomfort.  If directed, apply ice to the affected area. Using ice packs between sitz baths may be helpful. ? Put ice in a plastic bag. ? Place a towel between your skin and the bag. ? Leave the ice on for 20 minutes, 2-3 times a day. General instructions  Take over-the-counter and prescription medicines only as told by your health care provider.  Use medicated creams or suppositories as told.  Exercise regularly.  Go to the bathroom when you have the urge to have a bowel movement. Do not wait.    Avoid straining to have bowel movements.  Keep the anal area dry and clean. Use wet toilet paper or moist towelettes after a bowel movement.  Do not sit on the toilet for long periods of time. This increases blood pooling and pain. Contact a health care provider if:  You have increasing pain and swelling that are not controlled by treatment or medicine.  You have uncontrolled bleeding.  You  have difficulty having a bowel movement, or you are unable to have a bowel movement.  You have pain or inflammation outside the area of the hemorrhoids. This information is not intended to replace advice given to you by your health care provider. Make sure you discuss any questions you have with your health care provider. Document Released: 01/16/2000 Document Revised: 06/18/2015 Document Reviewed: 10/02/2014 Elsevier Interactive Patient Education  2017 Elsevier Inc.  

## 2016-08-24 ENCOUNTER — Ambulatory Visit: Payer: BC Managed Care – PPO | Admitting: General Surgery

## 2016-08-24 ENCOUNTER — Encounter: Payer: Self-pay | Admitting: General Surgery

## 2016-08-24 ENCOUNTER — Ambulatory Visit (INDEPENDENT_AMBULATORY_CARE_PROVIDER_SITE_OTHER): Payer: BC Managed Care – PPO | Admitting: General Surgery

## 2016-08-24 VITALS — BP 128/82 | HR 72 | Resp 12 | Ht 67.0 in | Wt 228.0 lb

## 2016-08-24 DIAGNOSIS — Z8719 Personal history of other diseases of the digestive system: Secondary | ICD-10-CM

## 2016-08-24 DIAGNOSIS — L290 Pruritus ani: Secondary | ICD-10-CM | POA: Diagnosis not present

## 2016-08-24 LAB — POC HEMOCCULT BLD/STL (OFFICE/1-CARD/DIAGNOSTIC): FECAL OCCULT BLD: NEGATIVE

## 2016-08-24 NOTE — Patient Instructions (Addendum)
The patient is aware to call back for any questions or concerns. Avoid soap to the rectal area, recommend tucks pads or wet wipes. Call for change in symptoms or worsening symptoms.

## 2016-08-24 NOTE — Progress Notes (Signed)
Patient ID: Norma Wall, female   DOB: 04/22/1970, 46 y.o.   MRN: [id number]  Chief Complaint  Patient presents with  . Rectal Problems    hemorrhoids    HPI Norma Wall is a 46 y.o. female.  Here for evaluation of possible hemorrhoids referred by Lillia Abed PA.  She states she has had hemorrhoid issues since delivery of children last child was 2006. She states she has flare ups of itching. She states the itching would wake her up at night. The pain would come from wiping too much. Occasional bleeding noted from an anal tear this occurs.on and off. She states her bowels area pasty, and her gas has a  severe odor. She is using magnesium for migraines and it has helped with her bowels. She states the cortisone creams helps while she uses it. She states the suppository seemed to help that she got from [facility] on 07-30-16. She states that she was on a cruise 08-09-16 to 08-14-16 and had no problems. She was here for gall bladder polyp ion 2014 but has not followed up recently.Denies pain. She is a Astronomer at [employer].  HPI  Past Medical History:  Diagnosis Date  . Abdominal pain, right upper quadrant 2013  . Endometriosis 01/2013  . Gallbladder polyp   . Ovarian cyst 2014   right ovary    Past Surgical History:  Procedure Laterality Date  . ABLATION  2012   cyst removal    Family History  Problem Relation Age of Onset  . Lung cancer Father   . Diabetes Mother   . Diabetes Maternal Grandmother   . Colon cancer Neg Hx     Social History Social History  Substance Use Topics  . Smoking status: Former Smoker    Types: Cigarettes    Quit date: 02/01/1997  . Smokeless tobacco: Never Used  . Alcohol use No    Allergies  Allergen Reactions  . Other     Itching to cantaloupe and carrots    Current Outpatient Prescriptions  Medication Sig Dispense Refill  . Cholecalciferol (VITAMIN D3) 5000 units CAPS Take by mouth.    .  clonazePAM (KLONOPIN) 0.5 MG tablet take 1 tablet by mouth twice a day if needed anxiety or PALPITATIONS. 60 tablet 1  . hydrocortisone (ANUCORT-HC) 25 MG suppository unwrap and insert 1 suppository rectally twice a day 10 suppository 5  . magnesium 30 MG tablet Take 30 mg by mouth daily.    . medroxyPROGESTERone (DEPO-PROVERA) 150 MG/ML injection use as directed every 3 months  0  . Multiple Vitamin (MULTIVITAMIN) tablet Take 1 tablet by mouth daily.    . promethazine (PHENERGAN) 25 MG tablet take 1 tablet by mouth every 4 hours if needed for nausea and vomiting 20 tablet 0  . spironolactone (ALDACTONE) 100 MG tablet Take 50 mg by mouth daily.    . SUMAtriptan (IMITREX) 50 MG tablet take 1 tablet by mouth every 2 hours if needed for migraines 10 tablet 3   No current facility-administered medications for this visit.     Review of Systems Review of Systems  Constitutional: Negative.   Respiratory: Negative.   Cardiovascular: Negative.   Gastrointestinal: Negative for constipation and diarrhea.    Blood pressure 128/82, pulse 72, resp. rate 12, height 5\' 7"  (1.702 m), weight 228 lb (103.4 kg).  Physical Exam Physical Exam  Constitutional: She is oriented to person, place, and time. She appears well-developed and well-nourished.  HENT:  Mouth/Throat:  Oropharynx is clear and moist.  Eyes: Conjunctivae are normal. No scleral icterus.  Neck: Neck supple.  Cardiovascular: Normal rate, regular rhythm and normal heart sounds.   Pulmonary/Chest: Effort normal and breath sounds normal.  Abdominal: Soft. Bowel sounds are normal. There is no tenderness.  Genitourinary: Rectal exam shows no internal hemorrhoid, no fissure, no tenderness, anal tone normal and guaiac negative stool.     Genitourinary Comments: Irritated rectal skin, skin tag present  Lymphadenopathy:    She has no cervical adenopathy.       Right: Inguinal adenopathy present.       Left: No inguinal adenopathy present.   Neurological: She is alert and oriented to person, place, and time.  Skin: Skin is warm and dry.  Psychiatric: Her behavior is normal.  Anoscopy: Normal rectal mucosa. No internal hemorrhoids.  Data Reviewed Stool Hemoccult negative.  Assessment    Pruritus ani  Previous evaluation for gallbladder polyp.    Plan    Between 2014 and 2016 on serial ultrasound examination the polyp increased from 3.1-3.6 mm. Well below threshold for surgical intervention. We discussed pros and cons of repeat imaging at this time, but as she remains asymptomatic I think that there is little to be gained conical observation is reasonable.    Avoid soap to the rectal area, recommend tucks pads or wet wipes for perianal cleansing after bowel movements. Desitin or A and D ointment acceptable for perianal comfort..  With no family history of colon cancer/polyps and only rare episodes of bleeding, would defer colonoscopy at this time.  Call for change in symptoms or worsening symptoms. Follow up as needed.   HPI, Physical Exam, Assessment and Plan have been scribed under the direction and in the presence of Robert Bellow, MD. Karie Fetch, RN  I have completed the exam and reviewed the above documentation for accuracy and completeness.  I agree with the above.  Haematologist has been used and any errors in dictation or transcription are unintentional.  Hervey Ard, M.D., F.A.C.S.  Robert Bellow 08/24/2016, 3:14 PM

## 2016-08-31 DIAGNOSIS — D485 Neoplasm of uncertain behavior of skin: Secondary | ICD-10-CM | POA: Diagnosis not present

## 2016-09-01 ENCOUNTER — Other Ambulatory Visit: Payer: Self-pay | Admitting: Physician Assistant

## 2016-09-01 DIAGNOSIS — Z1231 Encounter for screening mammogram for malignant neoplasm of breast: Secondary | ICD-10-CM

## 2016-09-06 ENCOUNTER — Ambulatory Visit
Admission: RE | Admit: 2016-09-06 | Discharge: 2016-09-06 | Disposition: A | Discharge status: Home / Self Care | Payer: BC Managed Care – PPO | Source: Ambulatory Visit | Attending: Physician Assistant | Admitting: Physician Assistant

## 2016-09-06 DIAGNOSIS — Z1231 Encounter for screening mammogram for malignant neoplasm of breast: Secondary | ICD-10-CM

## 2016-09-07 ENCOUNTER — Telehealth: Payer: Self-pay

## 2016-09-07 ENCOUNTER — Encounter: Payer: Self-pay | Admitting: Obstetrics and Gynecology

## 2016-09-07 NOTE — Telephone Encounter (Signed)
LMTCB

## 2016-09-07 NOTE — Telephone Encounter (Signed)
-----   Message from Mar Daring, [place] sent at 09/07/2016  9:55 AM EDT ----- Normal mammogram. Repeat screening in one year.

## 2016-09-08 NOTE — Telephone Encounter (Signed)
Patient advised as below. Patient reviewed on mychart.

## 2016-09-13 ENCOUNTER — Ambulatory Visit (INDEPENDENT_AMBULATORY_CARE_PROVIDER_SITE_OTHER): Payer: BC Managed Care – PPO | Admitting: Physician Assistant

## 2016-09-13 ENCOUNTER — Encounter: Payer: Self-pay | Admitting: Physician Assistant

## 2016-09-13 VITALS — BP 118/60 | HR 83 | Temp 98.4°F | Resp 16 | Ht 67.0 in | Wt 225.2 lb

## 2016-09-13 DIAGNOSIS — Z131 Encounter for screening for diabetes mellitus: Secondary | ICD-10-CM | POA: Diagnosis not present

## 2016-09-13 DIAGNOSIS — Z114 Encounter for screening for human immunodeficiency virus [HIV]: Secondary | ICD-10-CM

## 2016-09-13 DIAGNOSIS — Z136 Encounter for screening for cardiovascular disorders: Secondary | ICD-10-CM

## 2016-09-13 DIAGNOSIS — E559 Vitamin D deficiency, unspecified: Secondary | ICD-10-CM | POA: Diagnosis not present

## 2016-09-13 DIAGNOSIS — Z13220 Encounter for screening for lipoid disorders: Secondary | ICD-10-CM

## 2016-09-13 DIAGNOSIS — Z6835 Body mass index (BMI) 35.0-35.9, adult: Secondary | ICD-10-CM | POA: Diagnosis not present

## 2016-09-13 DIAGNOSIS — Z0000 Encounter for general adult medical examination without abnormal findings: Secondary | ICD-10-CM

## 2016-09-13 NOTE — Progress Notes (Signed)
Patient: Norma Wall, Female    DOB: Jan 04, 1971, 46 y.o.   MRN: [id number] Visit Date: 09/13/2016  Today's Provider: Mar Daring, PA-C   Chief Complaint  Patient presents with  . Annual Exam   Subjective:    Annual physical exam Norma Wall is a 46 y.o. female who presents today for health maintenance and complete physical. She feels well. She reports exercising rarely, just walking twice a week for an hour. She reports she is sleeping fairly well.  Last CPE:09/12/15 Mammogram:09/06/16-BI-RADS 1; breast exam completed then Pap due 01/2017-done in [place] with GYN -----------------------------------------------------------------   Review of Systems  Constitutional: Negative.   HENT: Negative.   Eyes: Negative.   Respiratory: Negative.   Cardiovascular: Negative.   Gastrointestinal: Negative.   Endocrine: Negative.   Genitourinary: Negative.   Musculoskeletal: Negative.   Skin: Negative.   Allergic/Immunologic: Negative.   Neurological: Negative.   Hematological: Negative.   Psychiatric/Behavioral: Negative.     Social History      She  reports that she quit smoking about 19 years ago. Her smoking use included Cigarettes. She has never used smokeless tobacco. She reports that she does not drink alcohol or use drugs.       Social History   Social History  . Marital status: Married    Spouse name: N/A  . Number of children: N/A  . Years of education: N/A   Social History Main Topics  . Smoking status: Former Smoker    Types: Cigarettes    Quit date: 02/01/1997  . Smokeless tobacco: Never Used  . Alcohol use No  . Drug use: No  . Sexual activity: Not Asked   Other Topics Concern  . None   Social History Narrative  . None    Past Medical History:  Diagnosis Date  . Abdominal pain, right upper quadrant 2013  . Endometriosis 01/2013  . Gallbladder polyp   . Ovarian cyst 2014   right ovary     Patient Active  Problem List   Diagnosis Date Noted  . Pruritus ani 08/24/2016  . History of rectal bleeding 08/24/2016  . Adult BMI 30+ 11/20/2014  . Dermatitis, eczematoid 11/20/2014  . Communicable disease contact 11/20/2014  . External hemorrhoid 11/20/2014  . High potassium 11/20/2014  . Feeling faint 11/20/2014  . Awareness of heartbeats 11/20/2014  . Feeling stressed out 11/20/2014  . Bursitis 11/20/2014  . Migraine 08/28/2014  . Gallbladder polyp 09/13/2012  . Avitaminosis D 05/19/2009  . Bloodgood disease 04/29/2009  . Excess, menstruation 04/24/2008  . Current tobacco use 04/24/2008    Past Surgical History:  Procedure Laterality Date  . ABLATION  2012   cyst removal    Family History        Family Status  Relation Status  . Father Deceased  . Mother Alive  . Brother Alive  . MGM (Not Specified)  . Neg Hx (Not Specified)        Her family history includes Diabetes in her maternal grandmother and mother; Lung cancer in her father.     Allergies  Allergen Reactions  . Other     Itching to cantaloupe and carrots     Current Outpatient Prescriptions:  .  Cholecalciferol (VITAMIN D3) 5000 units CAPS, Take by mouth., Disp: , Rfl:  .  clonazePAM (KLONOPIN) 0.5 MG tablet, take 1 tablet by mouth twice a day if needed anxiety or PALPITATIONS., Disp: 60 tablet, Rfl: 1 .  hydrocortisone (  ANUCORT-HC) 25 MG suppository, unwrap and insert 1 suppository rectally twice a day, Disp: 10 suppository, Rfl: 5 .  magnesium 30 MG tablet, Take 30 mg by mouth daily., Disp: , Rfl:  .  medroxyPROGESTERone (DEPO-PROVERA) 150 MG/ML injection, use as directed every 3 months, Disp: , Rfl: 0 .  Multiple Vitamin (MULTIVITAMIN) tablet, Take 1 tablet by mouth daily., Disp: , Rfl:  .  promethazine (PHENERGAN) 25 MG tablet, take 1 tablet by mouth every 4 hours if needed for nausea and vomiting, Disp: 20 tablet, Rfl: 0 .  spironolactone (ALDACTONE) 100 MG tablet, Take 50 mg by mouth daily., Disp: , Rfl:  .   SUMAtriptan (IMITREX) 50 MG tablet, take 1 tablet by mouth every 2 hours if needed for migraines, Disp: 10 tablet, Rfl: 3   Patient Care Team: Mar Daring, PA-C as PCP - General (Family Medicine) Bary Castilla, Forest Gleason, MD (General Surgery) Margarita Rana, MD as Referring Physician (Family Medicine) Mar Daring, PA-C as Physician Assistant (Family Medicine)      Objective:   Vitals: BP 118/60 (BP Location: Right Arm, Patient Position: Sitting, Cuff Size: Large)   Pulse 83   Temp 98.4 F (36.9 C) (Oral)   Resp 16   Ht 5\' 7"  (1.702 m)   Wt 225 lb 3.2 oz (102.2 kg)   BMI 35.27 kg/m     Physical Exam  Constitutional: She is oriented to person, place, and time. She appears well-developed and well-nourished.  HENT:  Head: Normocephalic and atraumatic.  Right Ear: Hearing, tympanic membrane, external ear and ear canal normal.  Left Ear: Hearing, tympanic membrane, external ear and ear canal normal.  Nose: Nose normal.  Mouth/Throat: Uvula is midline, oropharynx is clear and moist and mucous membranes are normal.  Eyes: Pupils are equal, round, and reactive to light. Conjunctivae and EOM are normal.  Neck: Normal range of motion. Neck supple.  Cardiovascular: Normal rate, regular rhythm, normal heart sounds and intact distal pulses.   Pulmonary/Chest: Effort normal and breath sounds normal.  Abdominal: Soft. Bowel sounds are normal.  Genitourinary:  Genitourinary Comments: Deferred to gyn  Musculoskeletal: Normal range of motion.  Neurological: She is alert and oriented to person, place, and time. She has normal reflexes.  Skin: Skin is warm and dry.  Psychiatric: She has a normal mood and affect. Her behavior is normal. Judgment normal.  Vitals reviewed.    Depression Screen PHQ 2/9 Scores 09/13/2016  PHQ - 2 Score 0      Assessment & Plan:     Routine Health Maintenance and Physical Exam  Exercise Activities and Dietary recommendations Goals    None        Immunization History  Administered Date(s) Administered  . Tdap 04/06/2010    Health Maintenance  Topic Date Due  . HIV Screening  01/29/1986  . PAP SMEAR  01/30/1992  . INFLUENZA VACCINE  09/01/2016  . TETANUS/TDAP  04/05/2020     Discussed health benefits of physical activity, and encouraged her to engage in regular exercise appropriate for her age and condition.    1. Annual physical exam Normal physical exam today. Will check labs as below and f/u pending lab results. If labs are stable and WNL she will not need to have these rechecked for one year at her next annual physical exam. She is to call the office in the meantime if she has any acute issue, questions or concerns. - CBC with Differential/Platelet - Comprehensive metabolic panel - TSH  2.  Encounter for lipid screening for cardiovascular disease Will check labs as below and f/u pending results. - Lipid panel  3. Encounter for screening for diabetes mellitus Will check labs as below and f/u pending results. - Hemoglobin A1c  4. Screening for HIV without presence of risk factors - HIV antibody (with reflex)  5. BMI 35.0-35.9,adult Patient is working on eating healthier (has been eating DASH diet with her husband) and trying to exercise more. She reports she will get more exercise once school starts back as she normally gets 10,000 steps at work alone.   6. Avitaminosis D H/O this. On OTC supplementation. Has required high dose supplementation before. Will check labs as below and f/u pending results. - Vitamin D (25 hydroxy)  --------------------------------------------------------------------    Mar Daring, PA-C  [facility]

## 2016-09-13 NOTE — Patient Instructions (Signed)

## 2016-09-14 LAB — COMPREHENSIVE METABOLIC PANEL
ALBUMIN: 4.6 g/dL (ref 3.5–5.5)
ALT: 18 IU/L (ref 0–32)
AST: 18 IU/L (ref 0–40)
Albumin/Globulin Ratio: 1.8 (ref 1.2–2.2)
Alkaline Phosphatase: 43 IU/L (ref 39–117)
BUN / CREAT RATIO: 11 (ref 9–23)
BUN: 11 mg/dL (ref 6–24)
Bilirubin Total: 0.5 mg/dL (ref 0.0–1.2)
CALCIUM: 9.5 mg/dL (ref 8.7–10.2)
CO2: 24 mmol/L (ref 20–29)
CREATININE: 1 mg/dL (ref 0.57–1.00)
Chloride: 105 mmol/L (ref 96–106)
GFR, EST AFRICAN AMERICAN: 79 mL/min/{1.73_m2} (ref >59)
GFR, EST NON AFRICAN AMERICAN: 68 mL/min/{1.73_m2} (ref >59)
GLOBULIN, TOTAL: 2.6 g/dL (ref 1.5–4.5)
Glucose: 84 mg/dL (ref 65–99)
Potassium: 4.7 mmol/L (ref 3.5–5.2)
SODIUM: 143 mmol/L (ref 134–144)
TOTAL PROTEIN: 7.2 g/dL (ref 6.0–8.5)

## 2016-09-14 LAB — LIPID PANEL
CHOL/HDL RATIO: 3.7 ratio (ref 0.0–4.4)
Cholesterol, Total: 168 mg/dL (ref 100–199)
HDL: 45 mg/dL (ref >39)
LDL Calculated: 105 mg/dL — ABNORMAL HIGH (ref 0–99)
Triglycerides: 91 mg/dL (ref 0–149)
VLDL CHOLESTEROL CAL: 18 mg/dL (ref 5–40)

## 2016-09-14 LAB — CBC WITH DIFFERENTIAL/PLATELET
BASOS ABS: 0 10*3/uL (ref 0.0–0.2)
BASOS: 1 %
EOS (ABSOLUTE): 0.1 10*3/uL (ref 0.0–0.4)
Eos: 1 %
HEMOGLOBIN: 14.1 g/dL (ref 11.1–15.9)
Hematocrit: 43.5 % (ref 34.0–46.6)
IMMATURE GRANS (ABS): 0 10*3/uL (ref 0.0–0.1)
IMMATURE GRANULOCYTES: 0 %
LYMPHS: 41 %
Lymphocytes Absolute: 1.8 10*3/uL (ref 0.7–3.1)
MCH: 29.4 pg (ref 26.6–33.0)
MCHC: 32.4 g/dL (ref 31.5–35.7)
MCV: 91 fL (ref 79–97)
MONOCYTES: 10 %
Monocytes Absolute: 0.4 10*3/uL (ref 0.1–0.9)
NEUTROS PCT: 47 %
Neutrophils Absolute: 2.1 10*3/uL (ref 1.4–7.0)
Platelets: 199 10*3/uL (ref 150–379)
RBC: 4.8 x10E6/uL (ref 3.77–5.28)
RDW: 13.2 % (ref 12.3–15.4)
WBC: 4.4 10*3/uL (ref 3.4–10.8)

## 2016-09-14 LAB — TSH: TSH: 1.74 u[IU]/mL (ref 0.450–4.500)

## 2016-09-14 LAB — HEMOGLOBIN A1C
Est. average glucose Bld gHb Est-mCnc: 100 mg/dL
Hgb A1c MFr Bld: 5.1 % (ref 4.8–5.6)

## 2016-09-14 LAB — HIV ANTIBODY (ROUTINE TESTING W REFLEX): HIV Screen 4th Generation wRfx: NONREACTIVE

## 2016-09-20 DIAGNOSIS — C44319 Basal cell carcinoma of skin of other parts of face: Secondary | ICD-10-CM | POA: Diagnosis not present

## 2016-10-13 ENCOUNTER — Ambulatory Visit (INDEPENDENT_AMBULATORY_CARE_PROVIDER_SITE_OTHER): Payer: BC Managed Care – PPO

## 2016-10-13 DIAGNOSIS — Z3042 Encounter for surveillance of injectable contraceptive: Secondary | ICD-10-CM | POA: Diagnosis not present

## 2016-10-13 MED ORDER — MEDROXYPROGESTERONE ACETATE 150 MG/ML IM SUSP
150.0000 mg | Freq: Once | INTRAMUSCULAR | Status: AC
  Administered 2016-10-13: 150 mg via INTRAMUSCULAR

## 2016-11-08 ENCOUNTER — Encounter: Payer: Self-pay | Admitting: Family Medicine

## 2016-11-08 ENCOUNTER — Ambulatory Visit (INDEPENDENT_AMBULATORY_CARE_PROVIDER_SITE_OTHER): Payer: BC Managed Care – PPO | Admitting: Family Medicine

## 2016-11-08 ENCOUNTER — Telehealth: Payer: Self-pay

## 2016-11-08 VITALS — BP 100/68 | HR 76 | Temp 98.2°F | Wt 229.6 lb

## 2016-11-08 DIAGNOSIS — R1032 Left lower quadrant pain: Secondary | ICD-10-CM | POA: Diagnosis not present

## 2016-11-08 NOTE — Progress Notes (Signed)
Patient: Norma Wall Female    DOB: 10-Jun-1970   46 y.o.   MRN: [id number] Visit Date: 11/08/2016  Today's Provider: Vernie Murders, PA   Chief Complaint  Patient presents with  . Abdominal Pain   Subjective:    Abdominal Pain  This is a new problem. The current episode started yesterday. The problem occurs constantly. The problem has been gradually worsening. The pain is located in the LLQ. The quality of the pain is dull and aching. The pain is aggravated by certain positions and movement. Relieved by: no movement. Treatments tried: Ibuprofen  The treatment provided mild relief. PMH includes constipation, Mild endometriosis and ovarian cyst    Past Medical History:  Diagnosis Date  . Abdominal pain, right upper quadrant 2013  . Endometriosis 01/2013  . Gallbladder polyp   . Ovarian cyst 2014   right ovary   Past Surgical History:  Procedure Laterality Date  . ABLATION  2012   cyst removal   Family History  Problem Relation Age of Onset  . Lung cancer Father   . Diabetes Mother   . Diabetes Maternal Grandmother   . Colon cancer Neg Hx    Allergies  Allergen Reactions  . Other     Itching to cantaloupe and carrots     Previous Medications   CHOLECALCIFEROL (VITAMIN D3) 5000 UNITS CAPS    Take by mouth.   CLONAZEPAM (KLONOPIN) 0.5 MG TABLET    take 1 tablet by mouth twice a day if needed anxiety or PALPITATIONS.   HYDROCORTISONE (ANUCORT-HC) 25 MG SUPPOSITORY    unwrap and insert 1 suppository rectally twice a day   MAGNESIUM 30 MG TABLET    Take 30 mg by mouth daily.   MEDROXYPROGESTERONE (DEPO-PROVERA) 150 MG/ML INJECTION    use as directed every 3 months   MULTIPLE VITAMIN (MULTIVITAMIN) TABLET    Take 1 tablet by mouth daily.   SPIRONOLACTONE (ALDACTONE) 100 MG TABLET    Take 50 mg by mouth daily.   SUMATRIPTAN (IMITREX) 50 MG TABLET    take 1 tablet by mouth every 2 hours if needed for migraines    Review of Systems  Constitutional: Negative.     Respiratory: Negative.   Cardiovascular: Negative.   Gastrointestinal: Positive for abdominal pain.    Social History  Substance Use Topics  . Smoking status: Former Smoker    Types: Cigarettes    Quit date: 02/01/1997  . Smokeless tobacco: Never Used  . Alcohol use No   Objective:   BP 100/68 (BP Location: Left Arm, Patient Position: Sitting, Cuff Size: Normal)   Pulse 76   Temp 98.2 F (36.8 C) (Oral)   Wt 229 lb 9.6 oz (104.1 kg)   SpO2 98%   BMI 35.96 kg/m   Physical Exam  Constitutional: She appears well-developed and well-nourished.  HENT:  Head: Normocephalic.  Eyes: Conjunctivae are normal.  Neck: Neck supple.  Cardiovascular: Normal rate and regular rhythm.   Pulmonary/Chest: Breath sounds normal.  Abdominal: Soft. Bowel sounds are normal. There is tenderness.  LLQ tenderness to test abdominal muscle strength. BS normal. No masses.      Assessment & Plan:     1. LLQ abdominal pain Onset yesterday with aching across lower abdomen that has localized to the LLQ. Tender to palpate and contract abdominal muscles/lifting legs. No fever, hematuria, dysuria, vaginal discharge, diarrhea, constipation, melena, gas, nausea, vomiting, dyspepsia or hematemesis. No menses since starting Depo-Provera and having ablation therapy. Suspect abdominal muscle  strain. Wants to try conservative treatment with Ibuprofen and moist heat applications. Has an appointment to see her GYN in a week with her history of ovarian cyst.

## 2016-11-08 NOTE — Telephone Encounter (Signed)
Noted. Thanks.

## 2016-11-08 NOTE — Telephone Encounter (Signed)
Pt called in complaining of left lower abdominal pain.  She states it started yesterday as just "generalized" abdominal but today is more on her lower left side.  She denies Fevers, bloody stool, and is normal constipated at baseline.  I made an appointment with Simona Huh at [place] (she wanted to be seen today.)    I advised her if any of the above symptoms start she would need to go to the ER to be evaluated.  She agreed.   Thanks,   -Mickel Baas

## 2016-11-12 ENCOUNTER — Other Ambulatory Visit: Payer: Self-pay | Admitting: Physician Assistant

## 2016-11-12 DIAGNOSIS — F411 Generalized anxiety disorder: Secondary | ICD-10-CM

## 2016-11-12 MED ORDER — CLONAZEPAM 0.5 MG PO TABS: ORAL_TABLET | ORAL | 1 refills | Status: DC

## 2016-11-12 NOTE — Progress Notes (Signed)
Clonazepam refilled 

## 2016-11-15 ENCOUNTER — Ambulatory Visit: Payer: BC Managed Care – PPO | Admitting: Obstetrics and Gynecology

## 2016-12-09 ENCOUNTER — Encounter: Payer: Self-pay | Admitting: Physician Assistant

## 2016-12-09 ENCOUNTER — Ambulatory Visit (INDEPENDENT_AMBULATORY_CARE_PROVIDER_SITE_OTHER): Payer: BC Managed Care – PPO | Admitting: Physician Assistant

## 2016-12-09 VITALS — BP 100/90 | HR 72 | Temp 98.2°F | Resp 20 | Wt 228.0 lb

## 2016-12-09 DIAGNOSIS — J0140 Acute pansinusitis, unspecified: Secondary | ICD-10-CM

## 2016-12-09 MED ORDER — AMOXICILLIN-POT CLAVULANATE 875-125 MG PO TABS: 1.0000 | ORAL_TABLET | Freq: Two times a day (BID) | ORAL | 0 refills | Status: DC

## 2016-12-09 NOTE — Patient Instructions (Signed)

## 2016-12-09 NOTE — Progress Notes (Signed)
Patient: Norma Wall Female    DOB: 03/30/1970   46 y.o.   MRN: [id number] Visit Date: 12/09/2016  Today's Provider: Mar Daring, PA-C   Chief Complaint  Patient presents with  . URI   Subjective:    HPI   Upper Respiratory Infection: Patient complains of symptoms of a URI. Symptoms include cough. Onset of symptoms was 5 days ago, gradually worsening since that time. She also c/o low grade fever, non productive cough and post nasal drip for the past 5 days .  She is drinking plenty of fluids. Evaluation to date: none. Treatment to date: antihistamines, cough suppressants and decongestants. 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    Allergies  Allergen Reactions  . Other     Itching to cantaloupe and carrots     Current Outpatient Medications:  .  Cholecalciferol (VITAMIN D3) 5000 units CAPS, Take by mouth., Disp: , Rfl:  .  clonazePAM (KLONOPIN) 0.5 MG tablet, take 1 tablet by mouth twice a day if needed anxiety or PALPITATIONS., Disp: 60 tablet, Rfl: 1 .  hydrocortisone (ANUCORT-HC) 25 MG suppository, unwrap and insert 1 suppository rectally twice a day, Disp: 10 suppository, Rfl: 5 .  magnesium 30 MG tablet, Take 30 mg by mouth daily., Disp: , Rfl:  .  medroxyPROGESTERone (DEPO-PROVERA) 150 MG/ML injection, use as directed every 3 months, Disp: , Rfl: 0 .  Multiple Vitamin (MULTIVITAMIN) tablet, Take 1 tablet by mouth daily., Disp: , Rfl:  .  spironolactone (ALDACTONE) 100 MG tablet, Take 50 mg by mouth daily., Disp: , Rfl:  .  SUMAtriptan (IMITREX) 50 MG tablet, take 1 tablet by mouth every 2 hours if needed for migraines, Disp: 10 tablet, Rfl: 3  Review of Systems  Constitutional: Negative.   HENT: Positive for congestion, postnasal drip, rhinorrhea and sore throat.   Respiratory: Positive for cough.   Cardiovascular: Negative.   Gastrointestinal: Negative.   Neurological: Positive for  headaches. Negative for dizziness.    Social History   Tobacco Use  . Smoking status: Former Smoker    Types: Cigarettes    Last attempt to quit: 02/01/1997    Years since quitting: 19.8  . Smokeless tobacco: Never Used  Substance Use Topics  . Alcohol use: No   Objective:   BP 100/90 (BP Location: Left Arm, Patient Position: Sitting, Cuff Size: Large)   Pulse 72   Temp 98.2 F (36.8 C) (Oral)   Resp 20   Wt 228 lb (103.4 kg)   SpO2 98%   BMI 35.71 kg/m  Vitals:   12/09/16 1605  BP: 100/90  Pulse: 72  Resp: 20  Temp: 98.2 F (36.8 C)  TempSrc: Oral  SpO2: 98%  Weight: 228 lb (103.4 kg)     Physical Exam  Constitutional: She appears well-developed and well-nourished. No distress.  HENT:  Head: Normocephalic and atraumatic.  Right Ear: Hearing, tympanic membrane, external ear and ear canal normal.  Left Ear: Hearing, tympanic membrane, external ear and ear canal normal.  Nose: Right sinus exhibits maxillary sinus tenderness and frontal sinus tenderness. Left sinus exhibits maxillary sinus tenderness and frontal sinus tenderness.  Mouth/Throat: Uvula is midline, oropharynx is clear and moist and mucous membranes are normal. No oropharyngeal exudate.  Neck: Normal range of motion. Neck supple. No tracheal deviation present. No thyromegaly present.  Cardiovascular: Normal rate, regular rhythm and normal heart sounds. Exam reveals no gallop  and no friction rub.  No murmur heard. Pulmonary/Chest: Effort normal and breath sounds normal. No stridor. No respiratory distress. She has no wheezes. She has no rales.  Lymphadenopathy:    She has no cervical adenopathy.  Skin: She is not diaphoretic.  Vitals reviewed.       Assessment & Plan:     1. Acute pansinusitis, recurrence not specified Worsening symptoms that have not responded to OTC medications. Will give augmentin as below. Continue allergy medications. Stay well hydrated and get plenty of rest. Call if no symptom  improvement or if symptoms worsen. - amoxicillin-clavulanate (AUGMENTIN) 875-125 MG tablet; Take 1 tablet 2 (two) times daily by mouth.  Dispense: 20 tablet; Refill: 0       Mar Daring, PA-C  [facility]

## 2017-01-01 ENCOUNTER — Other Ambulatory Visit: Payer: Self-pay | Admitting: Physician Assistant

## 2017-01-01 DIAGNOSIS — G43709 Chronic migraine without aura, not intractable, without status migrainosus: Secondary | ICD-10-CM

## 2017-01-03 ENCOUNTER — Telehealth: Payer: Self-pay | Admitting: Obstetrics and Gynecology

## 2017-01-03 NOTE — Telephone Encounter (Signed)
Pt needs depo refilled asap. Her appt is [place]

## 2017-01-04 NOTE — Telephone Encounter (Signed)
Pt is calling about her depo injection. Pt report her pharmacy isn't showing an refill for her injection. Please advise

## 2017-01-05 ENCOUNTER — Ambulatory Visit: Payer: BC Managed Care – PPO

## 2017-01-12 ENCOUNTER — Other Ambulatory Visit: Payer: Self-pay | Admitting: Obstetrics and Gynecology

## 2017-01-12 MED ORDER — MEDROXYPROGESTERONE ACETATE 150 MG/ML IM SUSP: 150.0000 mg | INTRAMUSCULAR | 0 refills | Status: DC

## 2017-01-12 NOTE — Telephone Encounter (Signed)
One refill sent until annual

## 2017-01-12 NOTE — Telephone Encounter (Signed)
Pt is calling about needing her Depo injection. Please advise. Pt would like a call. please

## 2017-01-12 NOTE — Telephone Encounter (Signed)
Annual is in January, please advise

## 2017-01-13 ENCOUNTER — Ambulatory Visit (INDEPENDENT_AMBULATORY_CARE_PROVIDER_SITE_OTHER): Payer: BC Managed Care – PPO | Admitting: Physician Assistant

## 2017-01-13 ENCOUNTER — Ambulatory Visit
Admission: RE | Admit: 2017-01-13 | Discharge: 2017-01-13 | Disposition: A | Discharge status: Home / Self Care | Payer: BC Managed Care – PPO | Source: Ambulatory Visit | Attending: Physician Assistant | Admitting: Physician Assistant

## 2017-01-13 ENCOUNTER — Encounter: Payer: Self-pay | Admitting: Physician Assistant

## 2017-01-13 VITALS — BP 100/60 | HR 84 | Temp 98.6°F | Resp 16 | Wt 231.0 lb

## 2017-01-13 DIAGNOSIS — R079 Chest pain, unspecified: Secondary | ICD-10-CM | POA: Diagnosis not present

## 2017-01-13 DIAGNOSIS — J189 Pneumonia, unspecified organism: Secondary | ICD-10-CM

## 2017-01-13 DIAGNOSIS — R05 Cough: Secondary | ICD-10-CM | POA: Diagnosis not present

## 2017-01-13 MED ORDER — BENZONATATE 200 MG PO CAPS: 200.0000 mg | ORAL_CAPSULE | Freq: Two times a day (BID) | ORAL | 0 refills | Status: DC | PRN

## 2017-01-13 MED ORDER — AZITHROMYCIN 250 MG PO TABS: ORAL_TABLET | ORAL | 0 refills | Status: DC

## 2017-01-13 MED ORDER — PREDNISONE 10 MG (21) PO TBPK: ORAL_TABLET | ORAL | 0 refills | Status: DC

## 2017-01-13 NOTE — Patient Instructions (Signed)

## 2017-01-13 NOTE — Progress Notes (Signed)
Patient: Norma Wall Female    DOB: 1971-01-06   46 y.o.   MRN: [id number] Visit Date: 01/13/2017  Today's Provider: Mar Daring, PA-C   No chief complaint on file.  Subjective:    HPI Upper Respiratory Infection: Patient complains of symptoms of a URI, possible sinusitis. Symptoms include cough. Onset of symptoms was 1 month ago, gradually worsening since that time. She also c/o cough described as harsh, post nasal drip and productive cough with  white colored sputum for the past 1 week .  She is drinking plenty of fluids. Evaluation to date: none seen previously and thought to have a sinus infection on 12/09/16. Treatment to date: antibiotics, augmentin x 10 days.     Allergies  Allergen Reactions  . Other     Itching to cantaloupe and carrots     Current Outpatient Medications:  .  amoxicillin-clavulanate (AUGMENTIN) 875-125 MG tablet, Take 1 tablet 2 (two) times daily by mouth., Disp: 20 tablet, Rfl: 0 .  Cholecalciferol (VITAMIN D3) 5000 units CAPS, Take by mouth., Disp: , Rfl:  .  clonazePAM (KLONOPIN) 0.5 MG tablet, take 1 tablet by mouth twice a day if needed anxiety or PALPITATIONS., Disp: 60 tablet, Rfl: 1 .  hydrocortisone (ANUCORT-HC) 25 MG suppository, unwrap and insert 1 suppository rectally twice a day, Disp: 10 suppository, Rfl: 5 .  magnesium 30 MG tablet, Take 30 mg by mouth daily., Disp: , Rfl:  .  medroxyPROGESTERone (DEPO-PROVERA) 150 MG/ML injection, Inject 1 mL (150 mg total) into the muscle every 3 (three) months., Disp: 1 mL, Rfl: 0 .  Multiple Vitamin (MULTIVITAMIN) tablet, Take 1 tablet by mouth daily., Disp: , Rfl:  .  spironolactone (ALDACTONE) 100 MG tablet, Take 50 mg by mouth daily., Disp: , Rfl:  .  SUMAtriptan (IMITREX) 50 MG tablet, TAKE 1 TABLET BY MOUTH IMMEDIATELY, MAY REPEAT AFTER 2 HOURS AS NEEDED, Disp: 10 tablet, Rfl: 5  Review of Systems  Constitutional: Negative.   HENT: Positive for congestion, ear pain,  postnasal drip, sinus pressure and sore throat. Negative for rhinorrhea, sinus pain and trouble swallowing (sometimes feels like something stuck ).   Respiratory: Positive for cough, chest tightness and shortness of breath.   Cardiovascular: Negative.   Gastrointestinal: Negative for abdominal pain and nausea.  Neurological: Negative for dizziness and headaches.    Social History   Tobacco Use  . Smoking status: Former Smoker    Types: Cigarettes    Last attempt to quit: 02/01/1997    Years since quitting: 19.9  . Smokeless tobacco: Never Used  Substance Use Topics  . Alcohol use: No   Objective:   There were no vitals taken for this visit. There were no vitals filed for this visit.   Physical Exam  Constitutional: She appears well-developed and well-nourished. No distress.  HENT:  Head: Normocephalic and atraumatic.  Right Ear: Hearing, tympanic membrane, external ear and ear canal normal.  Left Ear: Hearing, tympanic membrane, external ear and ear canal normal.  Nose: Nose normal.  Mouth/Throat: Uvula is midline, oropharynx is clear and moist and mucous membranes are normal. No oropharyngeal exudate, posterior oropharyngeal edema or posterior oropharyngeal erythema.  Eyes: Conjunctivae are normal. Pupils are equal, round, and reactive to light. Right eye exhibits no discharge. Left eye exhibits no discharge. No scleral icterus.  Neck: Normal range of motion. Neck supple. No tracheal deviation present. No thyromegaly present.  Cardiovascular: Normal rate, regular rhythm and normal heart sounds.  Exam reveals no gallop and no friction rub.  No murmur heard. Pulmonary/Chest: Effort normal. No stridor. No respiratory distress. She has no decreased breath sounds. She has wheezes in the left middle field. She has no rales.  Lymphadenopathy:    She has no cervical adenopathy.  Skin: Skin is warm and dry. She is not diaphoretic.  Vitals reviewed.       Assessment & Plan:     1.  Atypical pneumonia Will treat with Zpak and prednisone as below. Obtaining CXR as well. Tessalon perles given for cough. Push fluids. Stay well hydrated. She is to call if no improvements.  - azithromycin (ZITHROMAX) 250 MG tablet; Take 2 tablets PO on day one, and one tablet PO daily thereafter until completed.  Dispense: 6 tablet; Refill: 0 - DG Chest 2 View; Future - predniSONE (STERAPRED UNI-PAK 21 TAB) 10 MG (21) TBPK tablet; 6 day taper; take as directed on package instructions  Dispense: 21 tablet; Refill: 0 - benzonatate (TESSALON) 200 MG capsule; Take 1 capsule (200 mg total) by mouth 2 (two) times daily as needed for cough.  Dispense: 20 capsule; Refill: 0       Mar Daring, PA-C  [facility]

## 2017-01-19 ENCOUNTER — Ambulatory Visit (INDEPENDENT_AMBULATORY_CARE_PROVIDER_SITE_OTHER): Payer: BC Managed Care – PPO

## 2017-01-19 DIAGNOSIS — N912 Amenorrhea, unspecified: Secondary | ICD-10-CM

## 2017-01-19 DIAGNOSIS — Z3042 Encounter for surveillance of injectable contraceptive: Secondary | ICD-10-CM | POA: Diagnosis not present

## 2017-01-19 LAB — POCT URINE PREGNANCY: Preg Test, Ur: NEGATIVE

## 2017-01-19 MED ORDER — MEDROXYPROGESTERONE ACETATE 150 MG/ML IM SUSP
150.0000 mg | Freq: Once | INTRAMUSCULAR | Status: AC
  Administered 2017-01-19: 150 mg via INTRAMUSCULAR

## 2017-02-21 DIAGNOSIS — L578 Other skin changes due to chronic exposure to nonionizing radiation: Secondary | ICD-10-CM | POA: Diagnosis not present

## 2017-02-21 DIAGNOSIS — Z85828 Personal history of other malignant neoplasm of skin: Secondary | ICD-10-CM | POA: Diagnosis not present

## 2017-02-21 DIAGNOSIS — Z86018 Personal history of other benign neoplasm: Secondary | ICD-10-CM | POA: Diagnosis not present

## 2017-02-21 DIAGNOSIS — K080 Exfoliation of teeth due to systemic causes: Secondary | ICD-10-CM | POA: Diagnosis not present

## 2017-02-21 DIAGNOSIS — L905 Scar conditions and fibrosis of skin: Secondary | ICD-10-CM | POA: Diagnosis not present

## 2017-02-28 ENCOUNTER — Ambulatory Visit (INDEPENDENT_AMBULATORY_CARE_PROVIDER_SITE_OTHER): Payer: BC Managed Care – PPO | Admitting: Obstetrics and Gynecology

## 2017-02-28 ENCOUNTER — Encounter: Payer: Self-pay | Admitting: Obstetrics and Gynecology

## 2017-02-28 DIAGNOSIS — Z01419 Encounter for gynecological examination (general) (routine) without abnormal findings: Secondary | ICD-10-CM

## 2017-02-28 DIAGNOSIS — Z124 Encounter for screening for malignant neoplasm of cervix: Secondary | ICD-10-CM | POA: Diagnosis not present

## 2017-02-28 NOTE — Patient Instructions (Signed)
Preventive Care 40-64 Years, Female Preventive care refers to lifestyle choices and visits with your health care provider that can promote health and wellness. What does preventive care include?  A yearly physical exam. This is also called an annual well check.  Dental exams once or twice a year.  Routine eye exams. Ask your health care provider how often you should have your eyes checked.  Personal lifestyle choices, including: ? Daily care of your teeth and gums. ? Regular physical activity. ? Eating a healthy diet. ? Avoiding tobacco and drug use. ? Limiting alcohol use. ? Practicing safe sex. ? Taking low-dose aspirin daily starting at age 37. ? Taking vitamin and mineral supplements as recommended by your health care provider. What happens during an annual well check? The services and screenings done by your health care provider during your annual well check will depend on your age, overall health, lifestyle risk factors, and family history of disease. Counseling Your health care provider may ask you questions about your:  Alcohol use.  Tobacco use.  Drug use.  Emotional well-being.  Home and relationship well-being.  Sexual activity.  Eating habits.  Work and work Statistician.  Method of birth control.  Menstrual cycle.  Pregnancy history.  Screening You may have the following tests or measurements:  Height, weight, and BMI.  Blood pressure.  Lipid and cholesterol levels. These may be checked every 5 years, or more frequently if you are over 86 years old.  Skin check.  Lung cancer screening. You may have this screening every year starting at age 80 if you have a 30-pack-year history of smoking and currently smoke or have quit within the past 15 years.  Fecal occult blood test (FOBT) of the stool. You may have this test every year starting at age 28.  Flexible sigmoidoscopy or colonoscopy. You may have a sigmoidoscopy every 5 years or a colonoscopy  every 10 years starting at age 41.  Hepatitis C blood test.  Hepatitis B blood test.  Sexually transmitted disease (STD) testing.  Diabetes screening. This is done by checking your blood sugar (glucose) after you have not eaten for a while (fasting). You may have this done every 1-3 years.  Mammogram. This may be done every 1-2 years. Talk to your health care provider about when you should start having regular mammograms. This may depend on whether you have a family history of breast cancer.  BRCA-related cancer screening. This may be done if you have a family history of breast, ovarian, tubal, or peritoneal cancers.  Pelvic exam and Pap test. This may be done every 3 years starting at age 60. Starting at age 39, this may be done every 5 years if you have a Pap test in combination with an HPV test.  Bone density scan. This is done to screen for osteoporosis. You may have this scan if you are at high risk for osteoporosis.  Discuss your test results, treatment options, and if necessary, the need for more tests with your health care provider. Vaccines Your health care provider may recommend certain vaccines, such as:  Influenza vaccine. This is recommended every year.  Tetanus, diphtheria, and acellular pertussis (Tdap, Td) vaccine. You may need a Td booster every 10 years.  Varicella vaccine. You may need this if you have not been vaccinated.  Zoster vaccine. You may need this after age 63.  Measles, mumps, and rubella (MMR) vaccine. You may need at least one dose of MMR if you were born in  1957 or later. You may also need a second dose.  Pneumococcal 13-valent conjugate (PCV13) vaccine. You may need this if you have certain conditions and were not previously vaccinated.  Pneumococcal polysaccharide (PPSV23) vaccine. You may need one or two doses if you smoke cigarettes or if you have certain conditions.  Meningococcal vaccine. You may need this if you have certain  conditions.  Hepatitis A vaccine. You may need this if you have certain conditions or if you travel or work in places where you may be exposed to hepatitis A.  Hepatitis B vaccine. You may need this if you have certain conditions or if you travel or work in places where you may be exposed to hepatitis B.  Haemophilus influenzae type b (Hib) vaccine. You may need this if you have certain conditions.  Talk to your health care provider about which screenings and vaccines you need and how often you need them. This information is not intended to replace advice given to you by your health care provider. Make sure you discuss any questions you have with your health care provider. Document Released: 02/14/2015 Document Revised: 10/08/2015 Document Reviewed: 11/19/2014 Elsevier Interactive Patient Education  2018 [employer]  

## 2017-02-28 NOTE — Progress Notes (Signed)
Gynecology Annual Exam  PCP: Mar Daring, PA-C  Chief Complaint:  Chief Complaint  Patient presents with  . Gynecologic Exam    Discuss Depo Provera    History of Present Illness: Patient is a 47 y.o. G2P2 presents for annual exam. The patient has no complaints today.   LMP: No LMP recorded. Patient has had an ablation.  The patient is sexually active. She currently uses vasectomy for contraception. She denies dyspareunia.  The patient does perform self breast exams.  There is no notable family history of breast or ovarian cancer in her family.  The patient wears seatbelts: yes.   The patient has regular exercise: not asked.    The patient denies current symptoms of depression.    Review of Systems: Review of Systems  Constitutional: Negative for chills and fever.  HENT: Negative for congestion.   Respiratory: Negative for cough and shortness of breath.   Cardiovascular: Negative for chest pain and palpitations.  Gastrointestinal: Negative for abdominal pain, constipation, diarrhea, heartburn, nausea and vomiting.  Genitourinary: Negative for dysuria, frequency and urgency.  Skin: Negative for itching and rash.  Neurological: Negative for dizziness and headaches.  Endo/Heme/Allergies: Negative for polydipsia.  Psychiatric/Behavioral: Negative for depression.    Past Medical History:  Past Medical History:  Diagnosis Date  . Abdominal pain, right upper quadrant 2013  . Endometriosis 01/2013  . Gallbladder polyp   . Ovarian cyst 2014   right ovary    Past Surgical History:  Past Surgical History:  Procedure Laterality Date  . ABLATION  2012   cyst removal    Gynecologic History:  No LMP recorded. Patient has had an ablation. Contraception: vasectomy Last Pap: Results were: 02/12/2016 NIL and HR HPV negative  Last mammogram: 2017 Results were: BI-RAD I Obstetric History: G2P2  Family History:  Family History  Problem Relation Age of Onset  . Lung  cancer Father   . Diabetes Mother   . Diabetes Maternal Grandmother   . Colon cancer Neg Hx     Social History:  Social History   Socioeconomic History  . Marital status: Married    Spouse name: Not on file  . Number of children: Not on file  . Years of education: Not on file  . Highest education level: Not on file  Social Needs  . Financial resource strain: Not on file  . Food insecurity - worry: Not on file  . Food insecurity - inability: Not on file  . Transportation needs - medical: Not on file  . Transportation needs - non-medical: Not on file  Occupational History  . Not on file  Tobacco Use  . Smoking status: Former Smoker    Types: Cigarettes    Last attempt to quit: 02/01/1997    Years since quitting: 20.0  . Smokeless tobacco: Never Used  Substance and Sexual Activity  . Alcohol use: No  . Drug use: No  . Sexual activity: Yes    Birth control/protection: Injection  Other Topics Concern  . Not on file  Social History Narrative  . Not on file    Allergies:  Allergies  Allergen Reactions  . Other     Itching to cantaloupe and carrots    Medications: Prior to Admission medications   Medication Sig Start Date End Date Taking? Authorizing Provider  Cholecalciferol (VITAMIN D3) 5000 units CAPS Take by mouth.   Yes [provider]  clonazePAM (KLONOPIN) 0.5 MG tablet take 1 tablet by mouth twice a  day if needed anxiety or PALPITATIONS. 11/12/16  Yes Mar Daring, PA-C  magnesium 30 MG tablet Take 30 mg by mouth daily.   Yes [provider]  medroxyPROGESTERone (DEPO-PROVERA) 150 MG/ML injection Inject 1 mL (150 mg total) into the muscle every 3 (three) months. 01/12/17 04/12/17 Yes Malachy Mood, MD  Multiple Vitamin (MULTIVITAMIN) tablet Take 1 tablet by mouth daily.   Yes [provider]  spironolactone (ALDACTONE) 100 MG tablet Take 50 mg by mouth daily.   Yes [provider]  SUMAtriptan (IMITREX) 50 MG tablet  TAKE 1 TABLET BY MOUTH IMMEDIATELY, MAY REPEAT AFTER 2 HOURS AS NEEDED 01/03/17  Yes Mar Daring, PA-C    Physical Exam Vitals: Blood pressure 122/82, pulse 73, height 5\' 7"  (1.702 m), weight 233 lb (105.7 kg).  General: NAD HEENT: normocephalic, anicteric Thyroid: no enlargement, no palpable nodules Pulmonary: No increased work of breathing, CTAB Cardiovascular: RRR, distal pulses 2+ Breast: Breast symmetrical, no tenderness, no palpable nodules or masses, no skin or nipple retraction present, no nipple discharge.  No axillary or supraclavicular lymphadenopathy. Abdomen: NABS, soft, non-tender, non-distended.  Umbilicus without lesions.  No hepatomegaly, splenomegaly or masses palpable. No evidence of hernia  Genitourinary:  External: Normal external female genitalia.  Normal urethral meatus, normal  Bartholin's and Skene's glands.    Vagina: Normal vaginal mucosa, no evidence of prolapse.    Cervix: Grossly normal in appearance, no bleeding  Uterus: Non-enlarged, mobile, normal contour.  No CMT  Adnexa: ovaries non-enlarged, no adnexal masses  Rectal: deferred  Lymphatic: no evidence of inguinal lymphadenopathy Extremities: no edema, erythema, or tenderness Neurologic: Grossly intact Psychiatric: mood appropriate, affect full  Female chaperone present for pelvic and breast  portions of the physical exam    Assessment: 47 y.o. G2P2 routine annual exam  Plan: Problem List Items Addressed This Visit    None    Visit Diagnoses    Screening for malignant neoplasm of cervix       Encounter for gynecological examination without abnormal finding          1) Mammogram - recommend yearly screening mammogram.  Mammogram Was ordered today   2) STI screening was nor offered  3) ASCCP guidelines and rational discussed.  Patient opts for every 3 years screening interval  4) Contraception - vasectomy, discontinue depo provera  5) Colonoscopy -- Screening recommended  starting at age 42 for average risk individuals, age 34 for individuals deemed at increased risk (including African Americans) and recommended to continue until age 74.  For patient age 5-85 individualized approach is recommended.  Gold standard screening is via colonoscopy, Cologuard screening is an acceptable alternative for patient unwilling or unable to undergo colonoscopy.  "Colorectal cancer screening for average?risk adults: 2018 guideline update from the American Cancer Society"[place]: A Cancer Journal for Clinicians: Jun 30, 2016   6) Routine healthcare maintenance including cholesterol, diabetes screening discussed managed by PCP

## 2017-04-08 ENCOUNTER — Ambulatory Visit (INDEPENDENT_AMBULATORY_CARE_PROVIDER_SITE_OTHER): Payer: BC Managed Care – PPO | Admitting: Physician Assistant

## 2017-04-08 ENCOUNTER — Encounter: Payer: Self-pay | Admitting: Physician Assistant

## 2017-04-08 VITALS — BP 122/80 | HR 80 | Temp 98.5°F | Resp 16 | Wt 231.0 lb

## 2017-04-08 DIAGNOSIS — H9203 Otalgia, bilateral: Secondary | ICD-10-CM

## 2017-04-08 NOTE — Patient Instructions (Signed)

## 2017-04-08 NOTE — Progress Notes (Signed)
Patient: Norma Wall Female    DOB: Mar 05, 1970   47 y.o.   MRN: [id number] Visit Date: 04/08/2017  Today's Provider: Trinna Post, PA-C   Chief Complaint  Patient presents with  . Ear Pain    Right worse than left    Subjective:    Norma Wall is a 47 y/o woman complaining of bilateral ear pain x 2 weeks, concerned for ear infection.   Otalgia   There is pain in both (Right worse than left) ears. This is a new problem. The current episode started 1 to 4 weeks ago. The problem has been gradually worsening (Especially today). There has been no fever. Associated symptoms include a sore throat. Pertinent negatives include no ear discharge, headaches or rhinorrhea.       Allergies  Allergen Reactions  . Other     Itching to cantaloupe and carrots     Current Outpatient Medications:  .  Cholecalciferol (VITAMIN D3) 5000 units CAPS, Take by mouth., Disp: , Rfl:  .  clonazePAM (KLONOPIN) 0.5 MG tablet, take 1 tablet by mouth twice a day if needed anxiety or PALPITATIONS., Disp: 60 tablet, Rfl: 1 .  magnesium 30 MG tablet, Take 30 mg by mouth daily., Disp: , Rfl:  .  Multiple Vitamin (MULTIVITAMIN) tablet, Take 1 tablet by mouth daily., Disp: , Rfl:  .  spironolactone (ALDACTONE) 100 MG tablet, Take 50 mg by mouth daily., Disp: , Rfl:  .  SUMAtriptan (IMITREX) 50 MG tablet, TAKE 1 TABLET BY MOUTH IMMEDIATELY, MAY REPEAT AFTER 2 HOURS AS NEEDED, Disp: 10 tablet, Rfl: 5 .  medroxyPROGESTERone (DEPO-PROVERA) 150 MG/ML injection, Inject 1 mL (150 mg total) into the muscle every 3 (three) months. (Patient not taking: Reported on 04/08/2017), Disp: 1 mL, Rfl: 0  Review of Systems  Constitutional: Negative.   HENT: Positive for congestion, ear pain, postnasal drip, sinus pressure and sore throat. Negative for ear discharge, nosebleeds, rhinorrhea and sinus pain.   Eyes: Negative.   Respiratory: Negative.   Gastrointestinal: Negative.   Neurological: Negative  for dizziness, light-headedness and headaches.    Social History   Tobacco Use  . Smoking status: Former Smoker    Types: Cigarettes    Last attempt to quit: 02/01/1997    Years since quitting: 20.1  . Smokeless tobacco: Never Used  Substance Use Topics  . Alcohol use: No   Objective:   BP 122/80 (BP Location: Right Arm, Patient Position: Sitting, Cuff Size: Large)   Pulse 80   Temp 98.5 F (36.9 C) (Oral)   Resp 16   Wt 231 lb (104.8 kg)   BMI 36.18 kg/m  Vitals:   04/08/17 0938  BP: 122/80  Pulse: 80  Resp: 16  Temp: 98.5 F (36.9 C)  TempSrc: Oral  Weight: 231 lb (104.8 kg)     Physical Exam  Constitutional: She appears well-developed and well-nourished.  HENT:  Right Ear: External ear normal. Tympanic membrane is not injected.  Left Ear: External ear normal. Tympanic membrane is not injected.  Mouth/Throat: Oropharynx is clear and moist. No oropharyngeal exudate.  Tms opaque bilaterally but otherwise no evidence of infection.   Eyes: Conjunctivae are normal.        Assessment & Plan:     1. Otalgia of both ears  Suspect eustachian tube dysfunction. Recommend Claritin D + flonase.  Return if symptoms worsen or fail to improve.  The entirety of the information documented in the History  of Present Illness, Review of Systems and Physical Exam were personally obtained by me. Portions of this information were initially documented by Ashley Royalty, CMA and reviewed by me for thoroughness and accuracy.          Trinna Post, PA-C  [facility]

## 2017-04-13 ENCOUNTER — Ambulatory Visit: Payer: BC Managed Care – PPO

## 2017-04-21 ENCOUNTER — Encounter: Payer: Self-pay | Admitting: Physician Assistant

## 2017-04-21 ENCOUNTER — Other Ambulatory Visit: Payer: Self-pay | Admitting: Obstetrics and Gynecology

## 2017-04-22 ENCOUNTER — Encounter: Payer: Self-pay | Admitting: *Deleted

## 2017-04-22 ENCOUNTER — Ambulatory Visit
Admission: EM | Admit: 2017-04-22 | Discharge: 2017-04-22 | Disposition: A | Discharge status: Home / Self Care | Payer: BC Managed Care – PPO | Attending: Registered Nurse | Admitting: Registered Nurse

## 2017-04-22 ENCOUNTER — Other Ambulatory Visit: Payer: Self-pay

## 2017-04-22 DIAGNOSIS — H65193 Other acute nonsuppurative otitis media, bilateral: Secondary | ICD-10-CM

## 2017-04-22 DIAGNOSIS — H6593 Unspecified nonsuppurative otitis media, bilateral: Secondary | ICD-10-CM

## 2017-04-22 DIAGNOSIS — B349 Viral infection, unspecified: Secondary | ICD-10-CM

## 2017-04-22 DIAGNOSIS — J019 Acute sinusitis, unspecified: Secondary | ICD-10-CM

## 2017-04-22 MED ORDER — FLUTICASONE PROPIONATE 50 MCG/ACT NA SUSP: 1.0000 | Freq: Two times a day (BID) | NASAL | 1 refills | Status: DC

## 2017-04-22 MED ORDER — OSELTAMIVIR PHOSPHATE 75 MG PO CAPS: 75.0000 mg | ORAL_CAPSULE | Freq: Two times a day (BID) | ORAL | 0 refills | Status: AC

## 2017-04-22 MED ORDER — SALINE SPRAY 0.65 % NA SOLN: 2.0000 | NASAL | 1 refills | Status: DC

## 2017-04-22 MED ORDER — LORATADINE-PSEUDOEPHEDRINE ER 5-120 MG PO TB12: 1.0000 | ORAL_TABLET | Freq: Two times a day (BID) | ORAL | 1 refills | Status: DC

## 2017-04-22 MED ORDER — AMOXICILLIN-POT CLAVULANATE 875-125 MG PO TABS: 1.0000 | ORAL_TABLET | Freq: Two times a day (BID) | ORAL | 0 refills | Status: DC

## 2017-04-22 NOTE — ED Triage Notes (Signed)
Patient started having symptoms of headache, nausea, and body aches 4 days ago.

## 2017-04-22 NOTE — ED Provider Notes (Signed)
[facility]    CSN: [id number] Arrival date & time: 04/22/17  1202     History   Chief Complaint Chief Complaint  Patient presents with  . Nausea  . Headache    HPI Norma Wall is a 47 y.o. female.   46y/o new married patient female caucasian here for missing work needs work excuse.  Spouse with influenza last week.  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  Has been having fatigue, mild body aches, on/off ear pain, no energy, feels warm but no fever when temperature checked, nausea, headache started 19 Mar took her imitrex before she went to bed and woke up with headache again took her imitrex am and headache was back that afternoon.  Thought she was feeling better went back to work and fatigue, nausea, headache went back home.  PMHx migraines, seasonal allergies hasn't started her routine yet claritin/claritin D unsure of dose  Has flex spending account would like Rx for OTC, flonase.  Last sinus infection treated with augmentin by [facility] office last year.     Past Medical History:  Diagnosis Date  . Abdominal pain, right upper quadrant 2013  . Endometriosis 01/2013  . Gallbladder polyp   . Ovarian cyst 2014   right ovary    Patient Active Problem List   Diagnosis Date Noted  . Pruritus ani 08/24/2016  . History of rectal bleeding 08/24/2016  . Adult BMI 30+ 11/20/2014  . Dermatitis, eczematoid 11/20/2014  . Communicable disease contact 11/20/2014  . External hemorrhoid 11/20/2014  . High potassium 11/20/2014  . Feeling faint 11/20/2014  . Awareness of heartbeats 11/20/2014  . Feeling stressed out 11/20/2014  . Bursitis 11/20/2014  . Migraine 08/28/2014  . Gallbladder polyp 09/13/2012  . Avitaminosis D 05/19/2009  . Bloodgood disease 04/29/2009  . Excess, menstruation 04/24/2008  . Current tobacco use 04/24/2008    Past Surgical History:  Procedure Laterality Date  . ABLATION  2012   cyst  removal    OB History    Gravida  2   Para  2   Term      Preterm      AB      Living  2     SAB      TAB      Ectopic      Multiple      Live Births           Obstetric Comments  1st Menstrual Cycle:  12 1st Pregnancy:  27         Home Medications    Prior to Admission medications   Medication Sig Start Date End Date Taking? Authorizing Provider  Cholecalciferol (VITAMIN D3) 5000 units CAPS Take by mouth.   Yes [provider]  clonazePAM (KLONOPIN) 0.5 MG tablet take 1 tablet by mouth twice a day if needed anxiety or PALPITATIONS. 11/12/16  Yes Mar Daring, PA-C  magnesium 30 MG tablet Take 30 mg by mouth daily.   Yes [provider]  Multiple Vitamin (MULTIVITAMIN) tablet Take 1 tablet by mouth daily.   Yes [provider]  spironolactone (ALDACTONE) 100 MG tablet Take 50 mg by mouth daily.   Yes [provider]  SUMAtriptan (IMITREX) 50 MG tablet TAKE 1 TABLET BY MOUTH IMMEDIATELY, MAY REPEAT AFTER 2 HOURS AS NEEDED 01/03/17  Yes Idonna, Heeren, PA-C  amoxicillin-clavulanate (AUGMENTIN) 875-125 MG tablet Take 1 tablet by mouth every 12 (twelve)  hours. 04/22/17   Willine Schwalbe, Aura Fey, NP  fluticasone (FLONASE) 50 MCG/ACT nasal spray Place 1 spray into both nostrils 2 (two) times daily. 04/22/17 08/20/17  Zykira Matlack, Aura Fey, NP  loratadine-pseudoephedrine (CLARITIN-D 12 HOUR) 5-120 MG tablet Take 1 tablet by mouth 2 (two) times daily. 04/22/17   Briunna Leicht, Aura Fey, NP  oseltamivir (TAMIFLU) 75 MG capsule Take 1 capsule (75 mg total) by mouth 2 (two) times daily for 5 days. 04/22/17 04/27/17  Sanii Kukla, Aura Fey, NP  sodium chloride (OCEAN) 0.65 % SOLN nasal spray Place 2 sprays into both nostrils every 2 (two) hours while awake. 04/22/17 05/22/17  Troyce Gieske, Aura Fey, NP    Family History Family History  Problem Relation Age of Onset  . Lung cancer Father   . Diabetes Mother   . Diabetes Maternal Grandmother   . Colon  cancer Neg Hx     Social History Social History   Tobacco Use  . Smoking status: Former Smoker    Types: Cigarettes    Last attempt to quit: 02/01/1997    Years since quitting: 20.2  . Smokeless tobacco: Never Used  Substance Use Topics  . Alcohol use: No  . Drug use: No     Allergies   Other   Review of Systems Review of Systems  Constitutional: Negative for activity change, appetite change, chills, diaphoresis, fatigue, fever and unexpected weight change.  HENT: Positive for congestion, ear pain, postnasal drip, rhinorrhea and sinus pressure. Negative for dental problem, drooling, ear discharge, facial swelling, hearing loss, mouth sores, nosebleeds, sinus pain, sneezing, sore throat, tinnitus, trouble swallowing and voice change.   Eyes: Negative for photophobia, pain, discharge, redness, itching and visual disturbance.  Respiratory: Negative for cough, choking, chest tightness, shortness of breath, wheezing and stridor.   Cardiovascular: Negative for chest pain, palpitations and leg swelling.  Gastrointestinal: Negative for abdominal distention, abdominal pain, blood in stool, constipation, diarrhea, nausea and vomiting.  Endocrine: Negative for cold intolerance and heat intolerance.  Genitourinary: Negative for difficulty urinating, dysuria and hematuria.  Musculoskeletal: Negative for arthralgias, back pain, gait problem, joint swelling, myalgias, neck pain and neck stiffness.  Skin: Negative for color change, pallor, rash and wound.  Allergic/Immunologic: Positive for environmental allergies. Negative for food allergies.  Neurological: Positive for headaches. Negative for dizziness, tremors, seizures, syncope, facial asymmetry, speech difficulty, weakness, light-headedness and numbness.  Hematological: Negative for adenopathy. Does not bruise/bleed easily.  Psychiatric/Behavioral: Negative for agitation, behavioral problems, confusion and sleep disturbance.     Physical  Exam Triage Vital Signs ED Triage Vitals  Enc Vitals Group     BP 04/22/17 1220 116/82     Pulse Rate 04/22/17 1220 70     Resp 04/22/17 1220 16     Temp 04/22/17 1220 98.7 F (37.1 C)     Temp Source 04/22/17 1220 Oral     SpO2 04/22/17 1220 100 %     Weight 04/22/17 1222 220 lb (99.8 kg)     Height 04/22/17 1222 5\' 7"  (1.702 m)     Head Circumference --      Peak Flow --      Pain Score 04/22/17 1222 0     Pain Loc --      Pain Edu? --      Excl. in [place]? --    No data found.  Updated Vital Signs BP 116/82 (BP Location: Left Arm)   Pulse 70   Temp 98.7 F (37.1 C) (Oral)   Resp 16  Ht 5\' 7"  (1.702 m)   Wt 220 lb (99.8 kg)   SpO2 100%   BMI 34.46 kg/m    Physical Exam  Constitutional: She is oriented to person, place, and time. Vital signs are normal. She appears well-developed and well-nourished. She is active and cooperative.  Non-toxic appearance. She does not have a sickly appearance. She appears ill. No distress.  HENT:  Head: Normocephalic and atraumatic.  Right Ear: Hearing, external ear and ear canal normal. A middle ear effusion is present.  Left Ear: Hearing, external ear and ear canal normal. Tympanic membrane is erythematous and bulging. A middle ear effusion is present.  Nose: Mucosal edema and rhinorrhea present. No nose lacerations, sinus tenderness, nasal deformity, septal deviation or nasal septal hematoma. No epistaxis.  No foreign bodies. Right sinus exhibits no maxillary sinus tenderness and no frontal sinus tenderness. Left sinus exhibits no maxillary sinus tenderness and no frontal sinus tenderness.  Mouth/Throat: Uvula is midline and mucous membranes are normal. Mucous membranes are not pale, not dry and not cyanotic. She does not have dentures. No oral lesions. No trismus in the jaw. Normal dentition. No dental abscesses, uvula swelling, lacerations or dental caries. Posterior oropharyngeal edema and posterior oropharyngeal erythema present. No  oropharyngeal exudate or tonsillar abscesses.  Cobblestoning posterior pharynx; bilateral TMs air fluid level clear; left TM 12 oclock erythema; bilateral allergic shiners; nasal turbinates edema/erythema yellow tinged clear discharge  Eyes: Pupils are equal, round, and reactive to light. Conjunctivae, EOM and lids are normal. Right eye exhibits no chemosis, no discharge, no exudate and no hordeolum. No foreign body present in the right eye. Left eye exhibits no chemosis, no discharge, no exudate and no hordeolum. No foreign body present in the left eye. Right conjunctiva is not injected. Right conjunctiva has no hemorrhage. Left conjunctiva is not injected. Left conjunctiva has no hemorrhage. No scleral icterus. Right eye exhibits normal extraocular motion and no nystagmus. Left eye exhibits normal extraocular motion and no nystagmus. Right pupil is round and reactive. Left pupil is round and reactive. Pupils are equal.  Neck: Trachea normal, normal range of motion and phonation normal. Neck supple. No tracheal tenderness and no muscular tenderness present. No neck rigidity. No tracheal deviation, no edema, no erythema and normal range of motion present. No thyroid mass and no thyromegaly present.  Cardiovascular: Normal rate, regular rhythm, S1 normal, S2 normal, normal heart sounds and intact distal pulses. PMI is not displaced. Exam reveals no gallop, no distant heart sounds and no friction rub.  No murmur heard. Pulmonary/Chest: Effort normal and breath sounds normal. No accessory muscle usage or stridor. No tachypnea. No respiratory distress. She has no decreased breath sounds. She has no wheezes. She has no rhonchi. She has no rales. She exhibits no tenderness.  Spoke full sentences without difficulty; no cough observed in exam room  Abdominal: Soft. Normal appearance. She exhibits no distension. There is no rigidity and no guarding.  Musculoskeletal: Normal range of motion. She exhibits no edema or  tenderness.       Right shoulder: Normal.       Left shoulder: Normal.       Right elbow: Normal.      Left elbow: Normal.       Right hip: Normal.       Left hip: Normal.       Right knee: Normal.       Left knee: Normal.       Cervical back: Normal.  Thoracic back: Normal.       Lumbar back: Normal.       Right hand: Normal.       Left hand: Normal.  Lymphadenopathy:       Head (right side): No submental, no submandibular, no tonsillar, no preauricular, no posterior auricular and no occipital adenopathy present.       Head (left side): No submental, no submandibular, no tonsillar, no preauricular, no posterior auricular and no occipital adenopathy present.    She has no cervical adenopathy.       Right cervical: No superficial cervical, no deep cervical and no posterior cervical adenopathy present.      Left cervical: No superficial cervical, no deep cervical and no posterior cervical adenopathy present.  Neurological: She is alert and oriented to person, place, and time. She has normal strength. She is not disoriented. She displays no atrophy and no tremor. No cranial nerve deficit or sensory deficit. She exhibits normal muscle tone. She displays no seizure activity. Coordination and gait normal. GCS eye subscore is 4. GCS verbal subscore is 5. GCS motor subscore is 6.  In/out of chair without difficulty; gait sure and steady in hallway  Skin: Skin is warm, dry and intact. Capillary refill takes less than 2 seconds. No abrasion, no bruising, no burn, no ecchymosis, no laceration, no lesion, no petechiae and no rash noted. She is not diaphoretic. No cyanosis or erythema. No pallor. Nails show no clubbing.  Psychiatric: She has a normal mood and affect. Her speech is normal and behavior is normal. Judgment and thought content normal. She is not actively hallucinating. Cognition and memory are normal. She is attentive.  Nursing note and vitals reviewed.    UC Treatments / Results    Labs (all labs ordered are listed, but only abnormal results are displayed) Labs Reviewed - No data to display  EKG None Radiology No results found.  Procedures Procedures (including critical care time)  Medications Ordered in UC Medications - No data to display   Initial Impression / Assessment and Plan / UC Course  I have reviewed the triage vital signs and the nursing notes.  Pertinent labs & imaging results that were available during my care of the patient were reviewed by me and considered in my medical decision making (see chart for details).      Final Clinical Impressions(s) / UC Diagnoses   Final diagnoses:  Viral illness  Acute rhinosinusitis  Bilateral otitis media with effusion    ED Discharge Orders        Ordered    sodium chloride (OCEAN) 0.65 % SOLN nasal spray  Every 2 hours while awake     04/22/17 1353    fluticasone (FLONASE) 50 MCG/ACT nasal spray  2 times daily     04/22/17 1353    oseltamivir (TAMIFLU) 75 MG capsule  2 times daily     04/22/17 1353    loratadine-pseudoephedrine (CLARITIN-D 12 HOUR) 5-120 MG tablet  2 times daily     04/22/17 1353    amoxicillin-clavulanate (AUGMENTIN) 875-125 MG tablet  Every 12 hours     04/22/17 1353     Work excuse given 20-22 Apr 2017.  Sinus headache tylenol 1000mg  po QID prn pain.  Discussed with patient her typical migraine pattern is different from today.  Tuesday cold front/thunderstorm passed into our area and that could have triggered migraine on Tuesday.  Restart flonase 1 spray each nostril BID #1 RF1, saline 2 sprays each nostril  q2h wa prn congestion #1 RF1.  If no improvement with 48 hours of saline and flonase use start augmentin 875mg  po BID x 10 days #20 RF0.  Electronic Rxs given.  Denied personal or family history of ENT cancer.  Shower BID especially prior to bed. No evidence of systemic bacterial infection, non toxic and well hydrated.  I do not see where any further testing or imaging is  necessary at this time.   I will suggest supportive care, rest, good hygiene and encourage the patient to take adequate fluids.  The patient is to return to clinic or EMERGENCY ROOM if symptoms worsen or change significantly.  Exitcare handout on sinusitis and sinus rinse given to patient.  Patient verbalized agreement and understanding of treatment plan and had no further questions at this time.   P2:  Hand washing and cover cough  Electronic Rx augmentin 875mg  po BID x 10 days #20 RF0 to her pharmacy of choice.  Supportive treatment.   No evidence of invasive bacterial infection, non toxic and well hydrated.  This is most likely self limiting viral infection.  I do not see where any further testing or imaging is necessary at this time.   I will suggest supportive care, rest, good hygiene and encourage the patient to take adequate fluids.  The patient is to return to clinic or EMERGENCY ROOM if symptoms worsen or change significantly e.g. ear pain, fever, purulent discharge from ears or bleeding.  Exitcare handout on otitis media with effusion given to patient.  Patient verbalized agreement and understanding of treatment plan.    She has had flu shot but exposed to spouse with flu diagnosis last week.  Some body aches.  No fever.  Discussed with patient because she had vaccination symptoms for her may be milder.  Consider starting tamiflu 75mg  po BID x 5 days #10 RF0 Electronic Rx given.  Discussed bland diet, hydrate, rest due to nausea probably from viral post nasal drip.  Discussed adenovirus, influenza,  infectious mono (tonsillitis, fatigue, GI symptoms), viral gastroenteritis and pollen counts causing rhinitis in local area.  Viruses typically fatigue, nausea except gastroenteritis diarrhea/nausea/vomiting.  All are currently active in [place].  Discussed flu testing not indicated at this time because it is a clinical diagnosis, her work did not require it and patient did not  want flu testing either.  Patient verbalized understanding information/instructions, agreed with plan of care and had no further questions at this time.  Patient may use normal saline nasal spray 2 sprays each nostril q2h wa as needed #1 RF1. flonase 21mcg 1 spray each nostril BID #1 RF1.  Patient denied personal or family history of ENT cancer.  OTC antihistamine of choice claritin-D 5-120mg  po daily may take prn BID discussed if wearing off #60 RF1.    Avoid triggers if possible.  Shower prior to bedtime if exposed to triggers.  If allergic dust/dust mites recommend mattress/pillow covers/encasements; washing linens, vacuuming, sweeping, dusting weekly.  Call or return to clinic as needed if these symptoms worsen or fail to improve as anticipated.   Exitcare handout on allergic rhinitis and sinus rinse given to patient.  Patient verbalized understanding of instructions, agreed with plan of care and had no further questions at this time.  P2:  Avoidance and hand washing.  Controlled Substance Prescriptions [place] Controlled Substance Registry consulted?No   Olen Cordial, NP 04/22/17 2042

## 2017-05-04 ENCOUNTER — Encounter: Payer: Self-pay | Admitting: Physician Assistant

## 2017-05-04 ENCOUNTER — Ambulatory Visit (INDEPENDENT_AMBULATORY_CARE_PROVIDER_SITE_OTHER): Payer: BC Managed Care – PPO | Admitting: Physician Assistant

## 2017-05-04 VITALS — BP 120/72 | HR 74 | Temp 97.9°F | Resp 16 | Wt 231.0 lb

## 2017-05-04 DIAGNOSIS — J90 Pleural effusion, not elsewhere classified: Secondary | ICD-10-CM | POA: Diagnosis not present

## 2017-05-04 DIAGNOSIS — R059 Cough, unspecified: Secondary | ICD-10-CM

## 2017-05-04 DIAGNOSIS — R05 Cough: Secondary | ICD-10-CM | POA: Diagnosis not present

## 2017-05-04 MED ORDER — LEVOFLOXACIN 500 MG PO TABS: 500.0000 mg | ORAL_TABLET | Freq: Every day | ORAL | 0 refills | Status: DC

## 2017-05-04 MED ORDER — PREDNISONE 10 MG (21) PO TBPK: ORAL_TABLET | ORAL | 0 refills | Status: DC

## 2017-05-04 MED ORDER — BENZONATATE 200 MG PO CAPS
200.0000 mg | ORAL_CAPSULE | Freq: Two times a day (BID) | ORAL | 0 refills | Status: DC | PRN
Start: 2017-05-04 — End: 2017-08-25

## 2017-05-04 NOTE — Progress Notes (Signed)
Patient: Norma Wall Female    DOB: 1970-07-13   47 y.o.   MRN: [id number] Visit Date: 05/04/2017  Today's Provider: Mar Daring, PA-C   Chief Complaint  Patient presents with  . URI   Subjective:    HPI Upper Respiratory Infection: Patient complains of symptoms of a URI, possible sinusitis. Symptoms include right ear drainage  and congestion. Onset of symptoms was 4 days ago, gradually worsening since that time. She also c/o congestion, cough described as nonproductive, barking and worsening over time, shortness of breath and wheezing for the past 2 days .  She is drinking plenty of fluids. Evaluation to date: none. Treatment to date: antihistamines and nasal steroids.      Allergies  Allergen Reactions  . Other     Itching to cantaloupe and carrots     Current Outpatient Medications:  .  Cholecalciferol (VITAMIN D3) 5000 units CAPS, Take by mouth., Disp: , Rfl:  .  clonazePAM (KLONOPIN) 0.5 MG tablet, take 1 tablet by mouth twice a day if needed anxiety or PALPITATIONS., Disp: 60 tablet, Rfl: 1 .  fluticasone (FLONASE) 50 MCG/ACT nasal spray, Place 1 spray into both nostrils 2 (two) times daily., Disp: 16 g, Rfl: 1 .  loratadine-pseudoephedrine (CLARITIN-D 12 HOUR) 5-120 MG tablet, Take 1 tablet by mouth 2 (two) times daily., Disp: 60 tablet, Rfl: 1 .  magnesium 30 MG tablet, Take 30 mg by mouth daily., Disp: , Rfl:  .  Multiple Vitamin (MULTIVITAMIN) tablet, Take 1 tablet by mouth daily., Disp: , Rfl:  .  spironolactone (ALDACTONE) 100 MG tablet, Take 50 mg by mouth daily., Disp: , Rfl:  .  SUMAtriptan (IMITREX) 50 MG tablet, TAKE 1 TABLET BY MOUTH IMMEDIATELY, MAY REPEAT AFTER 2 HOURS AS NEEDED, Disp: 10 tablet, Rfl: 5 .  sodium chloride (OCEAN) 0.65 % SOLN nasal spray, Place 2 sprays into both nostrils every 2 (two) hours while awake. (Patient not taking: Reported on 05/04/2017), Disp: 104 mL, Rfl: 1  Review of Systems  Constitutional: Negative.     HENT: Positive for congestion, ear pain, postnasal drip, rhinorrhea, sinus pressure and sore throat.   Respiratory: Positive for cough and chest tightness. Negative for shortness of breath and wheezing.   Cardiovascular: Negative.   Gastrointestinal: Negative.   Neurological: Positive for headaches. Negative for dizziness.    Social History   Tobacco Use  . Smoking status: Former Smoker    Types: Cigarettes    Last attempt to quit: 02/01/1997    Years since quitting: 20.2  . Smokeless tobacco: Never Used  Substance Use Topics  . Alcohol use: No   Objective:   BP 120/72 (BP Location: Left Arm, Patient Position: Sitting, Cuff Size: Large)   Pulse 74   Temp 97.9 F (36.6 C) (Oral)   Resp 16   Wt 231 lb (104.8 kg)   SpO2 97%   BMI 36.18 kg/m  Vitals:   05/04/17 0847  BP: 120/72  Pulse: 74  Resp: 16  Temp: 97.9 F (36.6 C)  TempSrc: Oral  SpO2: 97%  Weight: 231 lb (104.8 kg)     Physical Exam  Constitutional: She appears well-developed and well-nourished. No distress.  HENT:  Head: Normocephalic and atraumatic.  Right Ear: Hearing, external ear and ear canal normal. Tympanic membrane is not perforated, not erythematous and not bulging. A middle ear effusion (opaque without bulge) is present.  Left Ear: Hearing, tympanic membrane, external ear and ear canal normal.  Nose: Nose normal.  Mouth/Throat: Uvula is midline, oropharynx is clear and moist and mucous membranes are normal. No oropharyngeal exudate.  Eyes: Pupils are equal, round, and reactive to light. Conjunctivae are normal. Right eye exhibits no discharge. Left eye exhibits no discharge. No scleral icterus.  Neck: Normal range of motion. Neck supple. No tracheal deviation present. No thyromegaly present.  Cardiovascular: Normal rate, regular rhythm and normal heart sounds. Exam reveals no gallop and no friction rub.  No murmur heard. Pulmonary/Chest: Effort normal. No stridor. No respiratory distress. She has  no wheezes. She has rales in the right lower field.  Lymphadenopathy:    She has no cervical adenopathy.  Skin: Skin is warm and dry. She is not diaphoretic.  Vitals reviewed.       Assessment & Plan:     1. Pleural effusion Will treat with levaquin for suspected pneumonia/pleural effusion. Prednisone taper given for inflammation. Tessalon perles for cough. Push fluids, rest. Call if symptoms worsen.  - levofloxacin (LEVAQUIN) 500 MG tablet; Take 1 tablet (500 mg total) by mouth daily.  Dispense: 7 tablet; Refill: 0 - predniSONE (STERAPRED UNI-PAK 21 TAB) 10 MG (21) TBPK tablet; 6 day taper; take as directed on package instructions  Dispense: 21 tablet; Refill: 0 - benzonatate (TESSALON) 200 MG capsule; Take 1 capsule (200 mg total) by mouth 2 (two) times daily as needed for cough.  Dispense: 20 capsule; Refill: 0  2. Cough See above medical treatment plan. - levofloxacin (LEVAQUIN) 500 MG tablet; Take 1 tablet (500 mg total) by mouth daily.  Dispense: 7 tablet; Refill: 0 - predniSONE (STERAPRED UNI-PAK 21 TAB) 10 MG (21) TBPK tablet; 6 day taper; take as directed on package instructions  Dispense: 21 tablet; Refill: 0 - benzonatate (TESSALON) 200 MG capsule; Take 1 capsule (200 mg total) by mouth 2 (two) times daily as needed for cough.  Dispense: 20 capsule; Refill: 0       Mar Daring, PA-C  [facility]

## 2017-06-09 DIAGNOSIS — F4322 Adjustment disorder with anxiety: Secondary | ICD-10-CM | POA: Diagnosis not present

## 2017-06-17 DIAGNOSIS — F4322 Adjustment disorder with anxiety: Secondary | ICD-10-CM | POA: Diagnosis not present

## 2017-06-24 DIAGNOSIS — F4322 Adjustment disorder with anxiety: Secondary | ICD-10-CM | POA: Diagnosis not present

## 2017-07-01 DIAGNOSIS — F4322 Adjustment disorder with anxiety: Secondary | ICD-10-CM | POA: Diagnosis not present

## 2017-07-04 ENCOUNTER — Encounter: Payer: Self-pay | Admitting: Physician Assistant

## 2017-07-11 DIAGNOSIS — F4322 Adjustment disorder with anxiety: Secondary | ICD-10-CM | POA: Diagnosis not present

## 2017-07-14 DIAGNOSIS — M7542 Impingement syndrome of left shoulder: Secondary | ICD-10-CM | POA: Diagnosis not present

## 2017-07-26 DIAGNOSIS — F4322 Adjustment disorder with anxiety: Secondary | ICD-10-CM | POA: Diagnosis not present

## 2017-07-27 ENCOUNTER — Other Ambulatory Visit: Payer: Self-pay | Admitting: Physician Assistant

## 2017-07-27 DIAGNOSIS — Z1231 Encounter for screening mammogram for malignant neoplasm of breast: Secondary | ICD-10-CM

## 2017-08-01 DIAGNOSIS — F4322 Adjustment disorder with anxiety: Secondary | ICD-10-CM | POA: Diagnosis not present

## 2017-08-15 DIAGNOSIS — K080 Exfoliation of teeth due to systemic causes: Secondary | ICD-10-CM | POA: Diagnosis not present

## 2017-08-17 DIAGNOSIS — F4322 Adjustment disorder with anxiety: Secondary | ICD-10-CM | POA: Diagnosis not present

## 2017-08-23 DIAGNOSIS — F4322 Adjustment disorder with anxiety: Secondary | ICD-10-CM | POA: Diagnosis not present

## 2017-08-25 ENCOUNTER — Encounter: Payer: Self-pay | Admitting: Physician Assistant

## 2017-08-25 ENCOUNTER — Ambulatory Visit (INDEPENDENT_AMBULATORY_CARE_PROVIDER_SITE_OTHER): Payer: BC Managed Care – PPO | Admitting: Physician Assistant

## 2017-08-25 VITALS — BP 102/60 | HR 90 | Temp 98.3°F | Resp 16 | Wt 232.8 lb

## 2017-08-25 DIAGNOSIS — M7542 Impingement syndrome of left shoulder: Secondary | ICD-10-CM

## 2017-08-25 DIAGNOSIS — Z6836 Body mass index (BMI) 36.0-36.9, adult: Secondary | ICD-10-CM | POA: Diagnosis not present

## 2017-08-25 DIAGNOSIS — R002 Palpitations: Secondary | ICD-10-CM

## 2017-08-25 DIAGNOSIS — R0789 Other chest pain: Secondary | ICD-10-CM | POA: Diagnosis not present

## 2017-08-25 NOTE — Progress Notes (Signed)
Patient: Norma Wall Female    DOB: 1970-03-24   47 y.o.   MRN: [id number] Visit Date: 08/25/2017  Today's Provider: Mar Daring, PA-C   Chief Complaint  Patient presents with  . Shoulder Pain   Subjective:    Shoulder Pain   Pain location: Upper back hurts daily and Lft pain in the front. This is a recurrent (Patient report that she saw Rachel Bo a month ago) problem. The current episode started more than 1 month ago. There has been no history of extremity trauma. The problem occurs constantly. The problem has been gradually worsening. The quality of the pain is described as aching. Pain scale: it varies it bother hers the most a night 4-5/10. The pain is moderate. Associated symptoms include a limited range of motion. Pertinent negatives include no fever, joint locking or joint swelling. Associated symptoms comments: She reports she gets the pressure in her chest specially at night. Like a "weight". Treatments tried: TUMS, massage.   Reports that she stopped the Meloxicam that was prescribed by Ortho doctor a week ago. Reports no improvement with meloxicam. She has also not done the home exercises regularly.  Chest Pain: occurs mostly at night when lying flat and is associated with palpitations and occasional SOB. Does have family history of cardiovascular disease in her father (passed from lung cancer/smoker).      Allergies  Allergen Reactions  . Other     Itching to cantaloupe and carrots     Current Outpatient Medications:  .  Cholecalciferol (VITAMIN D3) 5000 units CAPS, Take by mouth., Disp: , Rfl:  .  clonazePAM (KLONOPIN) 0.5 MG tablet, take 1 tablet by mouth twice a day if needed anxiety or PALPITATIONS., Disp: 60 tablet, Rfl: 1 .  fluticasone (FLONASE) 50 MCG/ACT nasal spray, Place 1 spray into both nostrils 2 (two) times daily., Disp: 16 g, Rfl: 1 .  loratadine-pseudoephedrine (CLARITIN-D 12 HOUR) 5-120 MG tablet, Take 1 tablet by mouth 2 (two) times  daily., Disp: 60 tablet, Rfl: 1 .  magnesium 30 MG tablet, Take 30 mg by mouth daily., Disp: , Rfl:  .  Multiple Vitamin (MULTIVITAMIN) tablet, Take 1 tablet by mouth daily., Disp: , Rfl:  .  spironolactone (ALDACTONE) 100 MG tablet, Take 50 mg by mouth daily., Disp: , Rfl:  .  SUMAtriptan (IMITREX) 50 MG tablet, TAKE 1 TABLET BY MOUTH IMMEDIATELY, MAY REPEAT AFTER 2 HOURS AS NEEDED, Disp: 10 tablet, Rfl: 5 .  benzonatate (TESSALON) 200 MG capsule, Take 1 capsule (200 mg total) by mouth 2 (two) times daily as needed for cough. (Patient not taking: Reported on 08/25/2017), Disp: 20 capsule, Rfl: 0 .  levofloxacin (LEVAQUIN) 500 MG tablet, Take 1 tablet (500 mg total) by mouth daily. (Patient not taking: Reported on 08/25/2017), Disp: 7 tablet, Rfl: 0 .  meloxicam (MOBIC) 15 MG tablet, Mobic 15 mg tablet  Take 1 tablet(s) every day by oral route., Disp: , Rfl:  .  predniSONE (STERAPRED UNI-PAK 21 TAB) 10 MG (21) TBPK tablet, 6 day taper; take as directed on package instructions (Patient not taking: Reported on 08/25/2017), Disp: 21 tablet, Rfl: 0 .  sodium chloride (OCEAN) 0.65 % SOLN nasal spray, Place 2 sprays into both nostrils every 2 (two) hours while awake. (Patient not taking: Reported on 05/04/2017), Disp: 104 mL, Rfl: 1  Review of Systems  Constitutional: Negative for fever.  Respiratory: Negative for cough, chest tightness and shortness of breath.   Cardiovascular: Positive  for chest pain ("Pressure"), palpitations and leg swelling ("always have swelling").  Gastrointestinal: Negative.   Musculoskeletal: Positive for arthralgias and myalgias.  Psychiatric/Behavioral: The patient is nervous/anxious.     Social History   Tobacco Use  . Smoking status: Former Smoker    Types: Cigarettes    Last attempt to quit: 02/01/1997    Years since quitting: 20.5  . Smokeless tobacco: Never Used  Substance Use Topics  . Alcohol use: No   Objective:   BP 102/60 (BP Location: Left Arm, Patient  Position: Sitting, Cuff Size: Normal)   Pulse 90   Temp 98.3 F (36.8 C) (Oral)   Resp 16   Wt 232 lb 12.8 oz (105.6 kg)   SpO2 97%   BMI 36.46 kg/m  Vitals:   08/25/17 1323  BP: 102/60  Pulse: 90  Resp: 16  Temp: 98.3 F (36.8 C)  TempSrc: Oral  SpO2: 97%  Weight: 232 lb 12.8 oz (105.6 kg)     Physical Exam  Constitutional: She appears well-developed and well-nourished. No distress.  Neck: Normal range of motion. Neck supple.  Cardiovascular: Normal rate, regular rhythm and normal heart sounds. Exam reveals no gallop and no friction rub.  No murmur heard. Pulmonary/Chest: Effort normal and breath sounds normal. No respiratory distress. She has no wheezes. She has no rales.  Musculoskeletal:       Left shoulder: She exhibits decreased range of motion (abduction) and tenderness. She exhibits no spasm, normal pulse and normal strength.  Positive Hawkins impingement test  Skin: She is not diaphoretic.  Psychiatric: She has a normal mood and affect. Her behavior is normal. Judgment and thought content normal.  Vitals reviewed.      Assessment & Plan:     1. Impingement syndrome of left shoulder Will refer to PT for evaluation and treatment. May use IBU or aleve prn.  - Ambulatory referral to Physical Therapy  2. Pressure in left side of chest EKG today is NSR rate of 75 with no ST changes. She does have family history of CAD in her father. She would like referral for cardiology for further evaluation, possible consideration of holter monitor. Patient concerned about Prinzmetal angina.  - EKG 12-Lead - Ambulatory referral to Cardiology  3. Palpitations See above medical treatment plan. - Ambulatory referral to Cardiology  4. BMI 36.0-36.9,adult Counseled patient on healthy lifestyle modifications including dieting and exercise.        Mar Daring, PA-C  [facility]

## 2017-08-26 DIAGNOSIS — R079 Chest pain, unspecified: Secondary | ICD-10-CM | POA: Insufficient documentation

## 2017-08-26 DIAGNOSIS — E669 Obesity, unspecified: Secondary | ICD-10-CM | POA: Diagnosis not present

## 2017-08-26 DIAGNOSIS — R0602 Shortness of breath: Secondary | ICD-10-CM | POA: Diagnosis not present

## 2017-08-26 DIAGNOSIS — R0789 Other chest pain: Secondary | ICD-10-CM | POA: Diagnosis not present

## 2017-08-28 ENCOUNTER — Encounter: Payer: Self-pay | Admitting: Physician Assistant

## 2017-09-08 DIAGNOSIS — F4322 Adjustment disorder with anxiety: Secondary | ICD-10-CM | POA: Diagnosis not present

## 2017-09-12 ENCOUNTER — Ambulatory Visit
Admission: RE | Admit: 2017-09-12 | Discharge: 2017-09-12 | Disposition: A | Discharge status: Home / Self Care | Payer: BC Managed Care – PPO | Source: Ambulatory Visit | Attending: Physician Assistant | Admitting: Physician Assistant

## 2017-09-12 ENCOUNTER — Telehealth: Payer: Self-pay

## 2017-09-12 DIAGNOSIS — Z1231 Encounter for screening mammogram for malignant neoplasm of breast: Secondary | ICD-10-CM

## 2017-09-12 NOTE — Telephone Encounter (Signed)
Patient advised as directed below.  Thanks,  -Maddie Brazier 

## 2017-09-12 NOTE — Telephone Encounter (Signed)
-----   Message from Mar Daring, PA-C sent at 09/12/2017  1:50 PM EDT ----- Normal mammogram. Repeat screening in one year.

## 2017-09-13 DIAGNOSIS — R0602 Shortness of breath: Secondary | ICD-10-CM | POA: Diagnosis not present

## 2017-09-13 DIAGNOSIS — R0789 Other chest pain: Secondary | ICD-10-CM | POA: Diagnosis not present

## 2017-09-14 ENCOUNTER — Encounter: Payer: BC Managed Care – PPO | Admitting: Physician Assistant

## 2017-09-16 DIAGNOSIS — F4322 Adjustment disorder with anxiety: Secondary | ICD-10-CM | POA: Diagnosis not present

## 2017-09-19 DIAGNOSIS — E669 Obesity, unspecified: Secondary | ICD-10-CM | POA: Diagnosis not present

## 2017-09-19 DIAGNOSIS — F411 Generalized anxiety disorder: Secondary | ICD-10-CM | POA: Diagnosis not present

## 2017-09-19 DIAGNOSIS — R0789 Other chest pain: Secondary | ICD-10-CM | POA: Diagnosis not present

## 2017-09-21 DIAGNOSIS — F4322 Adjustment disorder with anxiety: Secondary | ICD-10-CM | POA: Diagnosis not present

## 2017-09-27 ENCOUNTER — Encounter: Payer: Self-pay | Admitting: Physician Assistant

## 2017-09-27 ENCOUNTER — Ambulatory Visit (INDEPENDENT_AMBULATORY_CARE_PROVIDER_SITE_OTHER): Payer: BC Managed Care – PPO | Admitting: Physician Assistant

## 2017-09-27 VITALS — BP 120/80 | HR 80 | Temp 98.0°F | Resp 16 | Ht 67.0 in | Wt 226.0 lb

## 2017-09-27 DIAGNOSIS — E875 Hyperkalemia: Secondary | ICD-10-CM | POA: Diagnosis not present

## 2017-09-27 DIAGNOSIS — E559 Vitamin D deficiency, unspecified: Secondary | ICD-10-CM

## 2017-09-27 DIAGNOSIS — Z6835 Body mass index (BMI) 35.0-35.9, adult: Secondary | ICD-10-CM | POA: Diagnosis not present

## 2017-09-27 DIAGNOSIS — Z0000 Encounter for general adult medical examination without abnormal findings: Secondary | ICD-10-CM | POA: Diagnosis not present

## 2017-09-27 NOTE — Progress Notes (Signed)
Patient: Norma Wall, Female    DOB: Sep 14, 1970, 47 y.o.   MRN: [id number] Visit Date: 09/27/2017  Today's Provider: Mar Daring, PA-C   Chief Complaint  Patient presents with  . Annual Exam   Subjective:    Annual physical exam Norma Wall is a 47 y.o. female who presents today for health maintenance and complete physical. She feels well. She reports exercising some walking. She reports she is sleeping well.  09/16/16 CPE Pap: done at [facility] 09/12/17 Mammogram-BI-RADS 1 -----------------------------------------------------------------   Review of Systems  Constitutional: Negative.   HENT: Negative.   Eyes: Negative.   Respiratory: Negative.   Cardiovascular: Negative.   Gastrointestinal: Negative.   Endocrine: Negative.   Genitourinary: Negative.   Musculoskeletal: Negative.   Skin: Negative.   Allergic/Immunologic: Negative.   Neurological: Negative.   Hematological: Negative.   Psychiatric/Behavioral: Negative.     Social History      She  reports that she quit smoking about 20 years ago. Her smoking use included cigarettes. She has never used smokeless tobacco. She reports that she does not drink alcohol or use drugs.       Social History   Socioeconomic History  . Marital status: Married    Spouse name: Not on file  . Number of children: Not on file  . Years of education: Not on file  . Highest education level: Not on file  Occupational History  . Not on file  Social Needs  . Financial resource strain: Not on file  . Food insecurity:    Worry: Not on file    Inability: Not on file  . Transportation needs:    Medical: Not on file    Non-medical: Not on file  Tobacco Use  . Smoking status: Former Smoker    Types: Cigarettes    Last attempt to quit: 02/01/1997    Years since quitting: 20.6  . Smokeless tobacco: Never Used  Substance and Sexual Activity  . Alcohol use: No  . Drug use: No  . Sexual activity:  Yes    Birth control/protection: Injection  Lifestyle  . Physical activity:    Days per week: 0 days    Minutes per session: 0 min  . Stress: Rather much  Relationships  . Social connections:    Talks on phone: More than three times a week    Gets together: More than three times a week    Attends religious service: More than 4 times per year    Active member of club or organization: No    Attends meetings of clubs or organizations: Never    Relationship status: Married  Other Topics Concern  . Not on file  Social History Narrative  . Not on file    Past Medical History:  Diagnosis Date  . Abdominal pain, right upper quadrant 2013  . Endometriosis 01/2013  . Gallbladder polyp   . Ovarian cyst 2014   right ovary     Patient Active Problem List   Diagnosis Date Noted  . Pruritus ani 08/24/2016  . History of rectal bleeding 08/24/2016  . Adult BMI 30+ 11/20/2014  . Dermatitis, eczematoid 11/20/2014  . Communicable disease contact 11/20/2014  . External hemorrhoid 11/20/2014  . High potassium 11/20/2014  . Feeling faint 11/20/2014  . Awareness of heartbeats 11/20/2014  . Feeling stressed out 11/20/2014  . Bursitis 11/20/2014  . Migraine 08/28/2014  . Gallbladder polyp 09/13/2012  . Avitaminosis D 05/19/2009  .  Bloodgood disease 04/29/2009  . Excess, menstruation 04/24/2008  . Current tobacco use 04/24/2008    Past Surgical History:  Procedure Laterality Date  . ABLATION  2012   cyst removal    Family History        Family Status  Relation Name Status  . Father  Deceased  . Mother  Alive  . Brother  Alive  . MGM  (Not Specified)  . Neg Hx  (Not Specified)        Her family history includes Diabetes in her maternal grandmother and mother; Lung cancer in her father. There is no history of Colon cancer.      Allergies  Allergen Reactions  . Other     Itching to cantaloupe and carrots     Current Outpatient Medications:  .  Cholecalciferol (VITAMIN  D3) 5000 units CAPS, Take by mouth., Disp: , Rfl:  .  clonazePAM (KLONOPIN) 0.5 MG tablet, take 1 tablet by mouth twice a day if needed anxiety or PALPITATIONS., Disp: 60 tablet, Rfl: 1 .  magnesium 30 MG tablet, Take 30 mg by mouth daily., Disp: , Rfl:  .  Multiple Vitamin (MULTIVITAMIN) tablet, Take 1 tablet by mouth daily., Disp: , Rfl:  .  spironolactone (ALDACTONE) 100 MG tablet, Take 50 mg by mouth daily., Disp: , Rfl:  .  SUMAtriptan (IMITREX) 50 MG tablet, TAKE 1 TABLET BY MOUTH IMMEDIATELY, MAY REPEAT AFTER 2 HOURS AS NEEDED, Disp: 10 tablet, Rfl: 5 .  fluticasone (FLONASE) 50 MCG/ACT nasal spray, Place 1 spray into both nostrils 2 (two) times daily. (Patient not taking: Reported on 09/27/2017), Disp: 16 g, Rfl: 1 .  loratadine-pseudoephedrine (CLARITIN-D 12 HOUR) 5-120 MG tablet, Take 1 tablet by mouth 2 (two) times daily. (Patient not taking: Reported on 09/27/2017), Disp: 60 tablet, Rfl: 1 .  sodium chloride (OCEAN) 0.65 % SOLN nasal spray, Place 2 sprays into both nostrils every 2 (two) hours while awake. (Patient not taking: Reported on 05/04/2017), Disp: 104 mL, Rfl: 1   Patient Care Team: Mar Daring, PA-C as PCP - General (Family Medicine) Bary Castilla, Forest Gleason, MD (General Surgery) Margarita Rana, MD as Referring Physician (Family Medicine) Mar Daring, PA-C as Physician Assistant (Family Medicine)      Objective:   Vitals: BP 120/80 (BP Location: Left Arm, Patient Position: Sitting, Cuff Size: Large)   Pulse 80   Temp 98 F (36.7 C) (Oral)   Resp 16   Ht 5\' 7"  (1.702 m)   Wt 226 lb (102.5 kg)   SpO2 98%   BMI 35.40 kg/m    Vitals:   09/27/17 1520  BP: 120/80  Pulse: 80  Resp: 16  Temp: 98 F (36.7 C)  TempSrc: Oral  SpO2: 98%  Weight: 226 lb (102.5 kg)  Height: 5\' 7"  (1.702 m)     Physical Exam  Constitutional: She is oriented to person, place, and time. She appears well-developed and well-nourished. No distress.  HENT:  Head: Normocephalic  and atraumatic.  Right Ear: Hearing, tympanic membrane, external ear and ear canal normal.  Left Ear: Hearing, tympanic membrane, external ear and ear canal normal.  Nose: Nose normal.  Mouth/Throat: Uvula is midline, oropharynx is clear and moist and mucous membranes are normal. No oropharyngeal exudate.  Eyes: Pupils are equal, round, and reactive to light. Conjunctivae and EOM are normal. Right eye exhibits no discharge. Left eye exhibits no discharge. No scleral icterus.  Neck: Normal range of motion. Neck supple. No JVD present. No  tracheal deviation present. No thyromegaly present.  Cardiovascular: Normal rate, regular rhythm, normal heart sounds and intact distal pulses. Exam reveals no gallop and no friction rub.  No murmur heard. Pulmonary/Chest: Effort normal and breath sounds normal. No respiratory distress. She has no wheezes. She has no rales. She exhibits no tenderness.  Abdominal: Soft. Bowel sounds are normal. She exhibits no distension and no mass. There is no tenderness. There is no rebound and no guarding.  Genitourinary:  Genitourinary Comments: Deferred to [facility]  Musculoskeletal: Normal range of motion. She exhibits no edema or tenderness.  Lymphadenopathy:    She has no cervical adenopathy.  Neurological: She is alert and oriented to person, place, and time.  Skin: Skin is warm and dry. No rash noted. She is not diaphoretic.  Psychiatric: She has a normal mood and affect. Her behavior is normal. Judgment and thought content normal.  Vitals reviewed.    Depression Screen PHQ 2/9 Scores 11/08/2016 09/13/2016  PHQ - 2 Score 0 0      Assessment & Plan:     Routine Health Maintenance and Physical Exam  Exercise Activities and Dietary recommendations Goals   None     Immunization History  Administered Date(s) Administered  . Influenza Split 11/15/2016  . Tdap 04/06/2010    Health Maintenance  Topic Date Due  . PAP SMEAR  01/30/1992  .  INFLUENZA VACCINE  09/01/2017  . TETANUS/TDAP  04/05/2020  . HIV Screening  Completed     Discussed health benefits of physical activity, and encouraged her to engage in regular exercise appropriate for her age and condition.    1. Annual physical exam Normal physical exam today. Will check labs as below and f/u pending lab results. If labs are stable and WNL she will not need to have these rechecked for one year at her next annual physical exam. She is to call the office in the meantime if she has any acute issue, questions or concerns. - CBC w/Diff/Platelet - Comprehensive Metabolic Panel (CMET) - TSH - Lipid Profile - HgB A1c - Vitamin D (25 hydroxy)  2. Avitaminosis D On OTC supplement. Will check labs as below and f/u pending results. - CBC w/Diff/Platelet - Comprehensive Metabolic Panel (CMET) - TSH - Lipid Profile - HgB A1c - Vitamin D (25 hydroxy)  3. High potassium H/O this. Will check labs as below and f/u pending results. - CBC w/Diff/Platelet - Comprehensive Metabolic Panel (CMET) - TSH - Lipid Profile - HgB A1c - Vitamin D (25 hydroxy)  4. BMI 35.0-35.9,adult Counseled patient on healthy lifestyle modifications including dieting and exercise.   --------------------------------------------------------------------    Mar Daring, PA-C  [facility]

## 2017-09-28 DIAGNOSIS — F4322 Adjustment disorder with anxiety: Secondary | ICD-10-CM | POA: Diagnosis not present

## 2017-09-30 ENCOUNTER — Encounter: Payer: Self-pay | Admitting: Physician Assistant

## 2017-10-04 ENCOUNTER — Ambulatory Visit: Payer: BC Managed Care – PPO | Attending: Physician Assistant

## 2017-10-04 ENCOUNTER — Other Ambulatory Visit: Payer: Self-pay

## 2017-10-04 ENCOUNTER — Encounter: Payer: Self-pay | Admitting: Physician Assistant

## 2017-10-04 DIAGNOSIS — G8929 Other chronic pain: Secondary | ICD-10-CM | POA: Diagnosis not present

## 2017-10-04 DIAGNOSIS — M6281 Muscle weakness (generalized): Secondary | ICD-10-CM | POA: Insufficient documentation

## 2017-10-04 DIAGNOSIS — M25512 Pain in left shoulder: Secondary | ICD-10-CM | POA: Insufficient documentation

## 2017-10-04 DIAGNOSIS — M62838 Other muscle spasm: Secondary | ICD-10-CM | POA: Insufficient documentation
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
## 2017-10-04 NOTE — Patient Instructions (Signed)
Access Code: [email or web address]  URL: [email or web address]  Date: 10/04/2017  Prepared by: Lieutenant Diego   Exercises  Doorway Pec Stretch at 90 Degrees Abduction - 3 reps - 1 sets - 20 hold - 1x daily - 7x weekly  Scapular Retraction with Resistance - 10 reps - 1 sets - 1x daily - 4x weekly  Scapular Retraction with Resistance Advanced - 15 reps - 1 sets - 1x daily - 4x weekly  Shoulder External Rotation and Scapular Retraction with Resistance - 15 reps - 1 sets - 1x daily - 4x weekly  Correct Seated Posture - 1 sets - 2-3x daily - 7x weekly

## 2017-10-06 ENCOUNTER — Ambulatory Visit: Payer: BC Managed Care – PPO

## 2017-10-11 ENCOUNTER — Ambulatory Visit: Payer: BC Managed Care – PPO

## 2017-10-11 DIAGNOSIS — G8929 Other chronic pain: Secondary | ICD-10-CM

## 2017-10-11 DIAGNOSIS — M25512 Pain in left shoulder: Principal | ICD-10-CM

## 2017-10-11 DIAGNOSIS — M62838 Other muscle spasm: Secondary | ICD-10-CM | POA: Diagnosis not present

## 2017-10-11 DIAGNOSIS — M6281 Muscle weakness (generalized): Secondary | ICD-10-CM | POA: Diagnosis not present

## 2017-10-11 NOTE — Therapy (Signed)
[facility] [address] [place], [address] Phone: [phone]   Fax:  [phone]  Physical Therapy Treatment  Patient Details  Name: Norma Wall MRN: [id number] Date of Birth: 1970-07-11 Referring Provider: Fenton Malling   Encounter Date: 10/11/2017  PT End of Session - 10/11/17 1631    Visit Number  2    Number of Visits  16    Date for PT Re-Evaluation  11/29/17    Authorization Type  BCBS    PT Start Time  1601    PT Stop Time  1646    PT Time Calculation (min)  45 min    Activity Tolerance  Patient tolerated treatment well    Behavior During Therapy  [facility] for tasks assessed/performed       Past Medical History:  Diagnosis Date  . Abdominal pain, right upper quadrant 2013  . Endometriosis 01/2013  . Gallbladder polyp   . Ovarian cyst 2014   right ovary    Past Surgical History:  Procedure Laterality Date  . ABLATION  2012   cyst removal    There were no vitals filed for this visit.  Subjective Assessment - 10/11/17 1604    Subjective  Patient reports that she has been more aware of her posture since evaluation.     Currently in Pain?  No/denies   no pain at rest   Pain Onset  More than a month ago       Treatment:  Manual therapy x 63mins: Trigger point release to L levator/midtrap/UT to address muscle tension/pain management. In sitting and in supine to offload head.   Therapeutic exercise:Patient performed with instruction, verbal cues, tactile, cues and demonstration of therapist: goal: improve posture, strengthen b/l UE, decrease pain  Doorway Pec Stretch at 90 Degrees Abduction - 3 reps - 1 sets - 20 hold  Scapular Retraction with RTB - 15 reps  Scapular Retraction  Plus with RTB - 15 reps Shoulder External Rotation and Scapular Retraction with RTB- 15 reps  Scapular wall slides with pillow case x15 Levator scapulae stretch on L 3x20"    Modalities: Russian stim 10/10 cycle to L scapular muscles to promote scapular retractions with patient performing scapular retractions as well, x28mins (~9mHz)  Patient response to treatment: patient demonstrated improved technique with exercises with repeated demonstration and VC. No complaints of increased pain at end of session. Improved soft tissue integrity by 25% post STM.      PT Education - 10/11/17 1606    Education Details  therex technique/form    Person(s) Educated  Patient    Comprehension  Verbalized understanding       PT Short Term Goals - 10/04/17 1804      PT SHORT TERM GOAL #1   Title  Patient will be compliant with HEP at least 3x a week to demonstrate ability to self manage condition at home.     Time  4    Period  Weeks    Status  New    Target Date  11/01/17      PT SHORT TERM GOAL #2   Title  Patient will demonstrate improved functional ability exhibited by improved FOTO score by at least 10 points    Baseline  55    Time  4    Period  Weeks    Status  New    Target Date  11/01/17        PT Long  Term Goals - 10/04/17 1806      PT LONG TERM GOAL #1   Title  Patient will demonstrate improved L shoulder (and scapular) strength at least least 4+/5 to improve ability to perform functional tasks such as reaching, lifting, carrying.    Time  8    Period  Weeks    Status  New    Target Date  11/29/17      PT LONG TERM GOAL #2   Title  Patient will demonstrate improved ability to perform functional activities as well as work duties exhibited by improved FOTO score to 70 points or more.     Baseline  55    Time  8    Period  Weeks    Status  New    Target Date  11/29/17      PT LONG TERM GOAL #3   Title  Patient will report pain at worst with work activities as 2/10 or less to promote ease with necessary tasks.    Time  8    Period  Weeks    Status  New    Target Date  11/29/17      PT LONG TERM GOAL #4   Title  Patient will  demonstrate improved postural awareness exhibited by ability to maintain proper posture for at least 11minutes without prompting/external cues via patient report.    Time  8    Period  Weeks    Status  New    Target Date  11/29/17            Plan - 10/11/17 1648    Clinical Impression Statement  Patient needed cueing throughout session for proper scapular muscle engagement. Responded well to STM to L midtrap/levator scapulae. Patient does demonstrate improved awareness of postural abnormalities and self corrected mutliple times during session.    PT Frequency  2x / week    PT Duration  8 weeks    PT Treatment/Interventions  Electrical Stimulation;Traction;Moist Heat;Cryotherapy;DME Instruction;Therapeutic activities;Therapeutic exercise;Neuromuscular re-education;Dry needling;Patient/family education;Passive range of motion    PT Next Visit Plan  update HEP, shoulder mobs, scapular strengthening    PT Home Exercise Plan  scapular rows, scapular row extensions, postural awareness, ER with proper posture, doorway stretch    Consulted and Agree with Plan of Care  Patient       Patient will benefit from skilled therapeutic intervention in order to improve the following deficits and impairments:     Visit Diagnosis: Chronic left shoulder pain  Other muscle spasm  Muscle weakness (generalized)     Problem List Patient Active Problem List   Diagnosis Date Noted  . Pruritus ani 08/24/2016  . History of rectal bleeding 08/24/2016  . Adult BMI 30+ 11/20/2014  . Dermatitis, eczematoid 11/20/2014  . Communicable disease contact 11/20/2014  . External hemorrhoid 11/20/2014  . High potassium 11/20/2014  . Feeling faint 11/20/2014  . Awareness of heartbeats 11/20/2014  . Feeling stressed out 11/20/2014  . Bursitis 11/20/2014  . Migraine 08/28/2014  . Gallbladder polyp 09/13/2012  . Avitaminosis D 05/19/2009  . Bloodgood disease 04/29/2009  . Excess, menstruation 04/24/2008  .  Current tobacco use 04/24/2008    Lieutenant Diego PT, DPT 4:49 PM,10/11/17 [place] [facility] [address], [place], [address] Phone: [phone]   Fax:  [phone]  Name: Norma Wall MRN: [id number] Date of Birth: 05/20/1970

## 2017-10-13 ENCOUNTER — Ambulatory Visit: Payer: BC Managed Care – PPO

## 2017-10-13 DIAGNOSIS — F4322 Adjustment disorder with anxiety: Secondary | ICD-10-CM | POA: Diagnosis not present

## 2017-10-18 ENCOUNTER — Ambulatory Visit: Payer: BC Managed Care – PPO

## 2017-10-18 DIAGNOSIS — M6281 Muscle weakness (generalized): Secondary | ICD-10-CM | POA: Diagnosis not present

## 2017-10-18 DIAGNOSIS — M25512 Pain in left shoulder: Secondary | ICD-10-CM | POA: Diagnosis not present

## 2017-10-18 DIAGNOSIS — G8929 Other chronic pain: Secondary | ICD-10-CM

## 2017-10-18 DIAGNOSIS — M62838 Other muscle spasm: Secondary | ICD-10-CM

## 2017-10-18 NOTE — Therapy (Signed)
[facility] [address] [place], [address] Phone: [phone]   Fax:  [phone]  Physical Therapy Treatment  Patient Details  Name: Norma Wall MRN: [id number] Date of Birth: 1970-11-23 Referring Provider: Fenton Malling   Encounter Date: 10/18/2017  PT End of Session - 10/18/17 1228    Visit Number  3    Number of Visits  16    Date for PT Re-Evaluation  11/29/17    Authorization Type  BCBS    PT Start Time  1033    PT Stop Time  1115    PT Time Calculation (min)  42 min    Activity Tolerance  Patient tolerated treatment well    Behavior During Therapy  [facility] for tasks assessed/performed       Past Medical History:  Diagnosis Date  . Abdominal pain, right upper quadrant 2013  . Endometriosis 01/2013  . Gallbladder polyp   . Ovarian cyst 2014   right ovary    Past Surgical History:  Procedure Laterality Date  . ABLATION  2012   cyst removal    There were no vitals filed for this visit.  Subjective Assessment - 10/18/17 1035    Subjective  Patient reports that overall she feels better. Reports that she slept really well last night.    Currently in Pain?  Yes    Pain Score  1     Pain Location  Shoulder    Pain Onset  More than a month ago       Treatment:  Manual therapy x 15 mins: Trigger point release to L levator/midtrap/UT to address muscle tension/pain management. In sitting and in supine to offload head.  Therapeutic exercise:Patient performed with instruction, verbal cues, tactile,cues and demonstrationof therapist: goal: improve posture, strengthen b/l UE, decrease pain    Scapular Retraction with RTB- 3x 10 reps Scapular Retraction  Plus withRTB - 3 x 10 reps Shoulder External Rotation and Scapular Retraction withRTB- 30 reps  Scapular wall slides with pillow case x15 Lower trap lift offs x10  Forward elevation YTB x15 (L) Abduction YTB on L x10  (painful discontinued) Shoulder extension YTB x15 b/l  RS stabilization with RTB in supine, arm at 90deg 3x20sec  Patient response to treatment:patient demonstrated improved technique with exercises with repeated demonstration and VC. No complaints of increased pain at end of session. Improved soft tissue integrity by 25% post STM.   PT Education - 10/18/17 1225    Education Details  therex technique/form, added scapular slides to HEP    Person(s) Educated  Patient    Methods  Explanation;Demonstration    Comprehension  Verbalized understanding;Returned demonstration       PT Short Term Goals - 10/04/17 1804      PT SHORT TERM GOAL #1   Title  Patient will be compliant with HEP at least 3x a week to demonstrate ability to self manage condition at home.     Time  4    Period  Weeks    Status  New    Target Date  11/01/17      PT SHORT TERM GOAL #2   Title  Patient will demonstrate improved functional ability exhibited by improved FOTO score by at least 10 points    Baseline  55    Time  4    Period  Weeks    Status  New    Target Date  11/01/17  PT Long Term Goals - 10/04/17 1806      PT LONG TERM GOAL #1   Title  Patient will demonstrate improved L shoulder (and scapular) strength at least least 4+/5 to improve ability to perform functional tasks such as reaching, lifting, carrying.    Time  8    Period  Weeks    Status  New    Target Date  11/29/17      PT LONG TERM GOAL #2   Title  Patient will demonstrate improved ability to perform functional activities as well as work duties exhibited by improved FOTO score to 70 points or more.     Baseline  55    Time  8    Period  Weeks    Status  New    Target Date  11/29/17      PT LONG TERM GOAL #3   Title  Patient will report pain at worst with work activities as 2/10 or less to promote ease with necessary tasks.    Time  8    Period  Weeks    Status  New    Target Date  11/29/17      PT LONG TERM GOAL #4    Title  Patient will demonstrate improved postural awareness exhibited by ability to maintain proper posture for at least 68minutes without prompting/external cues via patient report.    Time  8    Period  Weeks    Status  New    Target Date  11/29/17            Plan - 10/18/17 1225    Clinical Impression Statement  Patient demonstrated good tolerance to progression of exercises today. Mod complaints of pain with L shoulder abduction with YTB. Continues to demonstrate trigger points in mid traps/levator scapulae/UT on the L. May benefit from trial TDN.     PT Treatment/Interventions  Electrical Stimulation;Traction;Moist Heat;Cryotherapy;DME Instruction;Therapeutic activities;Therapeutic exercise;Neuromuscular re-education;Dry needling;Patient/family education;Passive range of motion       Patient will benefit from skilled therapeutic intervention in order to improve the following deficits and impairments:  Decreased activity tolerance, Decreased endurance, Decreased strength, Improper body mechanics, Pain, Postural dysfunction, Impaired flexibility, Increased muscle spasms  Visit Diagnosis: Chronic left shoulder pain  Other muscle spasm  Muscle weakness (generalized)     Problem List Patient Active Problem List   Diagnosis Date Noted  . Pruritus ani 08/24/2016  . History of rectal bleeding 08/24/2016  . Adult BMI 30+ 11/20/2014  . Dermatitis, eczematoid 11/20/2014  . Communicable disease contact 11/20/2014  . External hemorrhoid 11/20/2014  . High potassium 11/20/2014  . Feeling faint 11/20/2014  . Awareness of heartbeats 11/20/2014  . Feeling stressed out 11/20/2014  . Bursitis 11/20/2014  . Migraine 08/28/2014  . Gallbladder polyp 09/13/2012  . Avitaminosis D 05/19/2009  . Bloodgood disease 04/29/2009  . Excess, menstruation 04/24/2008  . Current tobacco use 04/24/2008    Lieutenant Diego PT, DPT 12:30 PM,10/18/17 [phone]  [place] [address], [place], [address] Phone: [phone]   Fax:  [phone]  Name: Norma Wall MRN: [id number] Date of Birth: 01-23-1971

## 2017-10-20 ENCOUNTER — Encounter: Payer: Self-pay | Admitting: Physician Assistant

## 2017-10-20 ENCOUNTER — Telehealth: Payer: Self-pay

## 2017-10-20 ENCOUNTER — Ambulatory Visit: Payer: BC Managed Care – PPO

## 2017-10-20 DIAGNOSIS — M6281 Muscle weakness (generalized): Secondary | ICD-10-CM | POA: Diagnosis not present

## 2017-10-20 DIAGNOSIS — G8929 Other chronic pain: Secondary | ICD-10-CM

## 2017-10-20 DIAGNOSIS — M25512 Pain in left shoulder: Principal | ICD-10-CM

## 2017-10-20 DIAGNOSIS — M62838 Other muscle spasm: Secondary | ICD-10-CM | POA: Diagnosis not present

## 2017-10-20 LAB — COMPREHENSIVE METABOLIC PANEL
A/G RATIO: 2.1 (ref 1.2–2.2)
ALBUMIN: 4.6 g/dL (ref 3.5–5.5)
ALT: 40 IU/L — ABNORMAL HIGH (ref 0–32)
AST: 29 IU/L (ref 0–40)
Alkaline Phosphatase: 47 IU/L (ref 39–117)
BILIRUBIN TOTAL: 0.5 mg/dL (ref 0.0–1.2)
BUN/Creatinine Ratio: 14 (ref 9–23)
BUN: 13 mg/dL (ref 6–24)
CALCIUM: 9.8 mg/dL (ref 8.7–10.2)
CHLORIDE: 106 mmol/L (ref 96–106)
CO2: 21 mmol/L (ref 20–29)
Creatinine, Ser: 0.94 mg/dL (ref 0.57–1.00)
GFR, EST AFRICAN AMERICAN: 84 mL/min/{1.73_m2} (ref >59)
GFR, EST NON AFRICAN AMERICAN: 73 mL/min/{1.73_m2} (ref >59)
Globulin, Total: 2.2 g/dL (ref 1.5–4.5)
Glucose: 83 mg/dL (ref 65–99)
POTASSIUM: 4.2 mmol/L (ref 3.5–5.2)
Sodium: 143 mmol/L (ref 134–144)
TOTAL PROTEIN: 6.8 g/dL (ref 6.0–8.5)

## 2017-10-20 LAB — CBC WITH DIFFERENTIAL/PLATELET
BASOS: 1 %
Basophils Absolute: 0 10*3/uL (ref 0.0–0.2)
EOS (ABSOLUTE): 0.1 10*3/uL (ref 0.0–0.4)
EOS: 1 %
HEMOGLOBIN: 14.4 g/dL (ref 11.1–15.9)
Hematocrit: 43.2 % (ref 34.0–46.6)
IMMATURE GRANS (ABS): 0 10*3/uL (ref 0.0–0.1)
Immature Granulocytes: 0 %
LYMPHS ABS: 1.9 10*3/uL (ref 0.7–3.1)
LYMPHS: 34 %
MCH: 29.9 pg (ref 26.6–33.0)
MCHC: 33.3 g/dL (ref 31.5–35.7)
MCV: 90 fL (ref 79–97)
MONOCYTES: 9 %
Monocytes Absolute: 0.5 10*3/uL (ref 0.1–0.9)
NEUTROS ABS: 3.1 10*3/uL (ref 1.4–7.0)
Neutrophils: 55 %
PLATELETS: 226 10*3/uL (ref 150–450)
RBC: 4.82 x10E6/uL (ref 3.77–5.28)
RDW: 13 % (ref 12.3–15.4)
WBC: 5.7 10*3/uL (ref 3.4–10.8)

## 2017-10-20 LAB — LIPID PANEL
CHOL/HDL RATIO: 3.2 ratio (ref 0.0–4.4)
Cholesterol, Total: 155 mg/dL (ref 100–199)
HDL: 48 mg/dL (ref >39)
LDL CALC: 81 mg/dL (ref 0–99)
Triglycerides: 128 mg/dL (ref 0–149)
VLDL Cholesterol Cal: 26 mg/dL (ref 5–40)

## 2017-10-20 LAB — HEMOGLOBIN A1C
Est. average glucose Bld gHb Est-mCnc: 103 mg/dL
Hgb A1c MFr Bld: 5.2 % (ref 4.8–5.6)

## 2017-10-20 LAB — VITAMIN D 25 HYDROXY (VIT D DEFICIENCY, FRACTURES): VIT D 25 HYDROXY: 46.7 ng/mL (ref 30.0–100.0)

## 2017-10-20 LAB — TSH: TSH: 1.98 u[IU]/mL (ref 0.450–4.500)

## 2017-10-20 NOTE — Telephone Encounter (Signed)
-----   Message from Mar Daring, [place] sent at 10/20/2017  9:35 AM EDT ----- All labs are normal and stable with exception of one of your liver enzymes is slightly elevated. This is normally due to stress response in the body during or after an illness, increased alcohol intake, increased fatty foods in diet or from increased tylenol usage. I would recommend to limit those things in diet if they have increased and we can recheck in 8-12 weeks if desired.

## 2017-10-20 NOTE — Therapy (Addendum)
[facility] [address] [place], [address] Phone: [phone]   Fax:  [phone]  Physical Therapy Treatment  Patient Details  Name: Norma Wall MRN: [id number] Date of Birth: 07-03-1970 Referring Provider: Fenton Malling   Encounter Date: 10/20/2017  PT End of Session - 10/20/17 1035    Visit Number  4    Number of Visits  16    Date for PT Re-Evaluation  11/29/17    Authorization Type  BCBS    PT Start Time  1033    PT Stop Time  1115    PT Time Calculation (min)  42 min    Activity Tolerance  Patient tolerated treatment well    Behavior During Therapy  [facility] for tasks assessed/performed       Past Medical History:  Diagnosis Date  . Abdominal pain, right upper quadrant 2013  . Endometriosis 01/2013  . Gallbladder polyp   . Ovarian cyst 2014   right ovary    Past Surgical History:  Procedure Laterality Date  . ABLATION  2012   cyst removal    There were no vitals filed for this visit.  Subjective Assessment - 10/20/17 1034    Subjective  Pt reports some pain last night with sleeping. Overall she feels like therapy has been helpful. She is in agreement to try dry needling to see if it can help with her shoulder pain.    Pertinent History  Patient is 47 yo female that complains of L shoulder pain that started around December 2018. Reports at work her bag that she carried around got heavier due to increased caseload. States she was busier at work and needed to carry more supplies around. Also reports an increase of stress and tension in shoulders. Experiences most pain at end of day, especially sleeping. Also complains of pain during weight bearing through elbow, functional activities such as reaching, lifting, carrying, driving.     Diagnostic tests  Xray (patient reports that her physician said it was clear)    Patient Stated Goals  Decrease pain, ability to perform  functional/recreational activities    Currently in Pain?  Yes    Pain Score  1     Pain Location  Shoulder    Pain Orientation  Left;Posterior;Lateral    Pain Descriptors / Indicators  Aching    Pain Onset  More than a month ago    Pain Frequency  Intermittent         TREATMENT  Manual Therapy  Reassessed cervical spine with AROM and OP in all directions without reproduction of shoulder pain; Spurlings A and B both negative on the L side; Pt donned a gown with the opening in the back and leaving on her undergarments to provide as much coverage as possible during palpation and soft tissue assessment; Extensive palpation along L upper trap, levator scapulae, cervical extensors, pec major/minor, lats, infraspinatus, teres minor/major, supraspinatus, subscapularis, and rhomboids to assess for trigger points and reproduction of pain.  Unable to reproduce patient's exact pain with superficial palpation today. Most familiar reproduction of pain is with resisted L shoulder internal rotation as well as palpation of rhomboids; Extensive education with patient about subacromial impingement as well as scapular kinematics;  Trigger Point Dry Needling (TDN), unbilled Education performed with patient regarding potential benefit of TDN. Reviewed precautions and risks with patient. Reviewed special precautions/risks over lung fields which include pneumothorax. Reviewed signs and symptoms of pneumothorax and advised  pt to go to ER immediately if these symptoms develop advise them of dry needling treatment. Extensive time spent with pt to ensure full understanding of TDN risks. Pt provided verbal consent to treatment. TDN performed to L upper trap, L levator, L rhomboid major/minor, L subscapularis, and L teres major/lats. TDN performed with 0.30 x 60 single needle placements with no local twitch response (LTR) in traps, levator, and teres major/lats (utilized 24mm needle for lats/t major). LTR noted with TDN  to rhomboids and subscapularis. Needling performed with winging technique and insertion parallel to rib cage. Pistoning technique utilized. Possible mild improved pain-free L shoulder motion following intervention. She would benefit from possible assessment of infraspinatus TDN at follow-up visits but not needled today. No superficial trigger points identified with palpation.   Pt educated throughout session about proper posture and technique with exercises. Improved exercise technique, movement at target joints, use of target muscles after min to mod verbal, visual, tactile cues.     Pt with notable trigger points and local twitch response to dry needling of her L rhomboids and subscapularis muscles. Otherwise she has minimal response to L upper trap as well as L lats/teres major. Pt reports minimal improvement in active L shoulder abduction following intervention. Recommend attempted needling of infraspinatus at sometime to see if it reproduces patient's pain. Pt encouraged to follow her symptom response to TDN and notify primary therapist. No signs of cervical involvement in L shoulder symptoms at this time with quick screen. Pt encouraged to continue with her currently HEP and follow-up as scheduled.                       PT Short Term Goals - 10/04/17 1804      PT SHORT TERM GOAL #1   Title  Patient will be compliant with HEP at least 3x a week to demonstrate ability to self manage condition at home.     Time  4    Period  Weeks    Status  New    Target Date  11/01/17      PT SHORT TERM GOAL #2   Title  Patient will demonstrate improved functional ability exhibited by improved FOTO score by at least 10 points    Baseline  55    Time  4    Period  Weeks    Status  New    Target Date  11/01/17        PT Long Term Goals - 10/04/17 1806      PT LONG TERM GOAL #1   Title  Patient will demonstrate improved L shoulder (and scapular) strength at least least 4+/5 to  improve ability to perform functional tasks such as reaching, lifting, carrying.    Time  8    Period  Weeks    Status  New    Target Date  11/29/17      PT LONG TERM GOAL #2   Title  Patient will demonstrate improved ability to perform functional activities as well as work duties exhibited by improved FOTO score to 70 points or more.     Baseline  55    Time  8    Period  Weeks    Status  New    Target Date  11/29/17      PT LONG TERM GOAL #3   Title  Patient will report pain at worst with work activities as 2/10 or less to promote ease with necessary tasks.  Time  8    Period  Weeks    Status  New    Target Date  11/29/17      PT LONG TERM GOAL #4   Title  Patient will demonstrate improved postural awareness exhibited by ability to maintain proper posture for at least 63minutes without prompting/external cues via patient report.    Time  8    Period  Weeks    Status  New    Target Date  11/29/17            Plan - 10/20/17 1035    Clinical Impression Statement  Pt with notable trigger points and local twitch response to dry needling of her L rhomboids and subscapularis muscles. Otherwise she has minimal response to L upper trap as well as L lats/teres major. Pt reports minimal improvement in active L shoulder abduction following intervention. Recommend attempted needling of infraspinatus at sometime to see if it reproduces patient's pain. Pt encouraged to follow her symptom response to TDN and notify primary therapist. No signs of cervical involvement in L shoulder symptoms at this time with quick screen. Pt encouraged to continue with her currently HEP and follow-up as scheduled.     PT Treatment/Interventions  Electrical Stimulation;Traction;Moist Heat;Cryotherapy;DME Instruction;Therapeutic activities;Therapeutic exercise;Neuromuscular re-education;Dry needling;Patient/family education;Passive range of motion    PT Next Visit Plan  update HEP, shoulder mobs, scapular  strengthening    PT Home Exercise Plan  scapular rows, scapular row extensions, postural awareness, ER with proper posture, doorway stretch       Patient will benefit from skilled therapeutic intervention in order to improve the following deficits and impairments:  Decreased activity tolerance, Decreased endurance, Decreased strength, Improper body mechanics, Pain, Postural dysfunction, Impaired flexibility, Increased muscle spasms  Visit Diagnosis: Chronic left shoulder pain  Other muscle spasm  Muscle weakness (generalized)     Problem List Patient Active Problem List   Diagnosis Date Noted  . Pruritus ani 08/24/2016  . History of rectal bleeding 08/24/2016  . Adult BMI 30+ 11/20/2014  . Dermatitis, eczematoid 11/20/2014  . Communicable disease contact 11/20/2014  . External hemorrhoid 11/20/2014  . High potassium 11/20/2014  . Feeling faint 11/20/2014  . Awareness of heartbeats 11/20/2014  . Feeling stressed out 11/20/2014  . Bursitis 11/20/2014  . Migraine 08/28/2014  . Gallbladder polyp 09/13/2012  . Avitaminosis D 05/19/2009  . Bloodgood disease 04/29/2009  . Excess, menstruation 04/24/2008  . Current tobacco use 04/24/2008   Phillips Grout PT, DPT, GCS  Norma Wall 10/20/2017, 2:29 PM  [facility] [address] [place], [address] Phone: [phone]   Fax:  [phone]  Name: Norma Wall MRN: [id number] Date of Birth: [birth year]

## 2017-10-20 NOTE — Telephone Encounter (Signed)
Patient advised as directed below.  Thanks,  -Joseline 

## 2017-10-25 ENCOUNTER — Other Ambulatory Visit: Payer: Self-pay | Admitting: Physician Assistant

## 2017-10-25 DIAGNOSIS — F411 Generalized anxiety disorder: Secondary | ICD-10-CM

## 2017-10-26 ENCOUNTER — Ambulatory Visit: Payer: BC Managed Care – PPO

## 2017-10-26 DIAGNOSIS — M25512 Pain in left shoulder: Secondary | ICD-10-CM | POA: Diagnosis not present

## 2017-10-26 DIAGNOSIS — M6281 Muscle weakness (generalized): Secondary | ICD-10-CM | POA: Diagnosis not present

## 2017-10-26 DIAGNOSIS — M62838 Other muscle spasm: Secondary | ICD-10-CM

## 2017-10-26 DIAGNOSIS — G8929 Other chronic pain: Secondary | ICD-10-CM

## 2017-10-26 NOTE — Therapy (Signed)
[facility] [address], [place], [address] Phone: [phone]   Fax:  [phone]  Physical Therapy Treatment  Patient Details  Name: Norma Wall MRN: [id number] Date of Birth: 09/03/1970 Referring Provider: Fenton Malling   Encounter Date: 10/26/2017  PT End of Session - 10/26/17 1658    Visit Number  5    Number of Visits  16    Date for PT Re-Evaluation  11/29/17    Authorization Type  BCBS    PT Start Time  1700    PT Stop Time  1730    PT Time Calculation (min)  30 min    Activity Tolerance  Patient tolerated treatment well    Behavior During Therapy  [facility] for tasks assessed/performed       Past Medical History:  Diagnosis Date  . Abdominal pain, right upper quadrant 2013  . Endometriosis 01/2013  . Gallbladder polyp   . Ovarian cyst 2014   right ovary    Past Surgical History:  Procedure Laterality Date  . ABLATION  2012   cyst removal    There were no vitals filed for this visit.  Subjective Assessment - 10/26/17 1701    Subjective  Patient reports that she feels looser after TDN, though is unsure if it effected her pain. Overall feels looser and stronger.     Currently in Pain?  No/denies       TREATMENT  Manual Therapy: 74mins STM/trigger point release to teres minor/lat especially infraspinatus in prone  Therex:  Rhomboid stretch b/l 2x30secs Lower trap lift offs at wall x15 Attempted IR stretch, patient reports no stretch, discontinued Subscapularis stretch attempted, no reproduction of pain, discontinued Prone I, Y, T for scapular strengthening, x15 each. Except Y 2x5 due to fatigue   Patient response to treatment: Patient 36minutes late to session. Patient with no complaints at end of session, potentially improved L shoulder abduction s/p STM.      PT Education - 10/26/17 1702    Education Details  therex technique/form    Person(s) Educated   Patient    Methods  Explanation;Demonstration    Comprehension  Verbalized understanding;Returned demonstration       PT Short Term Goals - 10/04/17 1804      PT SHORT TERM GOAL #1   Title  Patient will be compliant with HEP at least 3x a week to demonstrate ability to self manage condition at home.     Time  4    Period  Weeks    Status  New    Target Date  11/01/17      PT SHORT TERM GOAL #2   Title  Patient will demonstrate improved functional ability exhibited by improved FOTO score by at least 10 points    Baseline  55    Time  4    Period  Weeks    Status  New    Target Date  11/01/17        PT Long Term Goals - 10/04/17 1806      PT LONG TERM GOAL #1   Title  Patient will demonstrate improved L shoulder (and scapular) strength at least least 4+/5 to improve ability to perform functional tasks such as reaching, lifting, carrying.    Time  8    Period  Weeks    Status  New    Target Date  11/29/17      PT LONG TERM GOAL #2  Title  Patient will demonstrate improved ability to perform functional activities as well as work duties exhibited by improved FOTO score to 70 points or more.     Baseline  55    Time  8    Period  Weeks    Status  New    Target Date  11/29/17      PT LONG TERM GOAL #3   Title  Patient will report pain at worst with work activities as 2/10 or less to promote ease with necessary tasks.    Time  8    Period  Weeks    Status  New    Target Date  11/29/17      PT LONG TERM GOAL #4   Title  Patient will demonstrate improved postural awareness exhibited by ability to maintain proper posture for at least 55minutes without prompting/external cues via patient report.    Time  8    Period  Weeks    Status  New    Target Date  11/29/17            Plan - 10/26/17 1730    Clinical Impression Statement  Patient with notable trigger points in L infraspinatus, with referring pain through LUE. Most difficulty with prone exercises due to  fatigue but no pain during session.    PT Frequency  2x / week    PT Duration  8 weeks    PT Treatment/Interventions  Electrical Stimulation;Traction;Moist Heat;Cryotherapy;DME Instruction;Therapeutic activities;Therapeutic exercise;Neuromuscular re-education;Dry needling;Patient/family education;Passive range of motion       Patient will benefit from skilled therapeutic intervention in order to improve the following deficits and impairments:     Visit Diagnosis: Chronic left shoulder pain  Other muscle spasm  Muscle weakness (generalized)     Problem List Patient Active Problem List   Diagnosis Date Noted  . Pruritus ani 08/24/2016  . History of rectal bleeding 08/24/2016  . Adult BMI 30+ 11/20/2014  . Dermatitis, eczematoid 11/20/2014  . Communicable disease contact 11/20/2014  . External hemorrhoid 11/20/2014  . High potassium 11/20/2014  . Feeling faint 11/20/2014  . Awareness of heartbeats 11/20/2014  . Feeling stressed out 11/20/2014  . Bursitis 11/20/2014  . Migraine 08/28/2014  . Gallbladder polyp 09/13/2012  . Avitaminosis D 05/19/2009  . Bloodgood disease 04/29/2009  . Excess, menstruation 04/24/2008  . Current tobacco use 04/24/2008    Lieutenant Diego PT, DPT 6:00 PM,10/26/17 [facility] [address] [place], [address] Phone: [phone]   Fax:  [phone]  Name: Norma Wall MRN: [id number] Date of Birth: [birth year]

## 2017-10-31 ENCOUNTER — Ambulatory Visit: Payer: BC Managed Care – PPO

## 2017-10-31 DIAGNOSIS — M25512 Pain in left shoulder: Principal | ICD-10-CM

## 2017-10-31 DIAGNOSIS — G8929 Other chronic pain: Secondary | ICD-10-CM

## 2017-10-31 DIAGNOSIS — M6281 Muscle weakness (generalized): Secondary | ICD-10-CM | POA: Diagnosis not present

## 2017-10-31 DIAGNOSIS — M62838 Other muscle spasm: Secondary | ICD-10-CM | POA: Diagnosis not present

## 2017-10-31 DIAGNOSIS — F4322 Adjustment disorder with anxiety: Secondary | ICD-10-CM | POA: Diagnosis not present

## 2017-10-31 NOTE — Therapy (Signed)
[facility] [address], [place], [address] Phone: [phone]   Fax:  [phone]  Physical Therapy Treatment  Patient Details  Name: Norma Wall MRN: [id number] Date of Birth: [birth year] Referring Provider (PT): Fenton Malling   Encounter Date: 10/31/2017  PT End of Session - 10/31/17 0805    Visit Number  6    Number of Visits  16    Date for PT Re-Evaluation  11/29/17    Authorization Type  BCBS    PT Start Time  0805    PT Stop Time  0846    PT Time Calculation (min)  41 min    Activity Tolerance  Patient tolerated treatment well    Behavior During Therapy  [facility] for tasks assessed/performed       Past Medical History:  Diagnosis Date  . Abdominal pain, right upper quadrant 2013  . Endometriosis 01/2013  . Gallbladder polyp   . Ovarian cyst 2014   right ovary    Past Surgical History:  Procedure Laterality Date  . ABLATION  2012   cyst removal    There were no vitals filed for this visit.  Subjective Assessment - 10/31/17 0803    Subjective  Patient reports that she felt her pain was decreased after last session. States she felt something pop this weekend in her shoulder but her pain felt better afterwards.     Currently in Pain?  No/denies       TREATMENT  Manual Therapy: 75mins STM/trigger point release to teres minor/lat especially infraspinatus in prone. Inferior/posterior L shoulder mobilizations grade III x 5 mins   Therex:  Rhomboid stretch b/l 3x30secs Lat stretch supine 3x20s ER isometrics in varying degrees of ER in supine 5x3s bouts in each position Supine IR/ER with 1# weight at pause of end range x10 Standing ER with RTB in neutral x20 ER in 90/90 with RTB x15 b/l ER stretch/doorway stretch ~120degs 3x30s Wall ER ~120deg  lift off of wall x15 Scapular rows #15 at column x 15 Scapular rows advanced x15 with GTB Prone I, Y, T for scapular  strengthening, x15 each   Patient response to treatment: Patient with no complaints at end of session. HEP reviewed.     PT Education - 10/31/17 0839    Education Details  therex, condition    Person(s) Educated  Patient    Methods  Explanation;Demonstration    Comprehension  Verbalized understanding;Returned demonstration       PT Short Term Goals - 10/04/17 1804      PT SHORT TERM GOAL #1   Title  Patient will be compliant with HEP at least 3x a week to demonstrate ability to self manage condition at home.     Time  4    Period  Weeks    Status  New    Target Date  11/01/17      PT SHORT TERM GOAL #2   Title  Patient will demonstrate improved functional ability exhibited by improved FOTO score by at least 10 points    Baseline  55    Time  4    Period  Weeks    Status  New    Target Date  11/01/17        PT Long Term Goals - 10/04/17 1806      PT LONG TERM GOAL #1   Title  Patient will demonstrate improved L shoulder (and scapular) strength at least least  4+/5 to improve ability to perform functional tasks such as reaching, lifting, carrying.    Time  8    Period  Weeks    Status  New    Target Date  11/29/17      PT LONG TERM GOAL #2   Title  Patient will demonstrate improved ability to perform functional activities as well as work duties exhibited by improved FOTO score to 70 points or more.     Baseline  55    Time  8    Period  Weeks    Status  New    Target Date  11/29/17      PT LONG TERM GOAL #3   Title  Patient will report pain at worst with work activities as 2/10 or less to promote ease with necessary tasks.    Time  8    Period  Weeks    Status  New    Target Date  11/29/17      PT LONG TERM GOAL #4   Title  Patient will demonstrate improved postural awareness exhibited by ability to maintain proper posture for at least 38minutes without prompting/external cues via patient report.    Time  8    Period  Weeks    Status  New    Target Date   11/29/17            Plan - 10/31/17 0840    Clinical Impression Statement  Patient with trigger points in L infraspinatus, teres major/minor that are TTP, reproduces concordant pain. Patient reports some mild pain at end range of ER in supine. Overall good tolerance to program today.     PT Frequency  2x / week    PT Duration  8 weeks    PT Treatment/Interventions  Electrical Stimulation;Traction;Moist Heat;Cryotherapy;DME Instruction;Therapeutic activities;Therapeutic exercise;Neuromuscular re-education;Dry needling;Patient/family education;Passive range of motion    PT Next Visit Plan  update HEP, shoulder mobs, scapular strengthening    PT Home Exercise Plan  scapular rows, scapular row extensions, postural awareness, ER with proper posture, doorway stretch    Consulted and Agree with Plan of Care  Patient       Patient will benefit from skilled therapeutic intervention in order to improve the following deficits and impairments:     Visit Diagnosis: Chronic left shoulder pain  Muscle weakness (generalized)  Other muscle spasm     Problem List Patient Active Problem List   Diagnosis Date Noted  . Pruritus ani 08/24/2016  . History of rectal bleeding 08/24/2016  . Adult BMI 30+ 11/20/2014  . Dermatitis, eczematoid 11/20/2014  . Communicable disease contact 11/20/2014  . External hemorrhoid 11/20/2014  . High potassium 11/20/2014  . Feeling faint 11/20/2014  . Awareness of heartbeats 11/20/2014  . Feeling stressed out 11/20/2014  . Bursitis 11/20/2014  . Migraine 08/28/2014  . Gallbladder polyp 09/13/2012  . Avitaminosis D 05/19/2009  . Bloodgood disease 04/29/2009  . Excess, menstruation 04/24/2008  . Current tobacco use 04/24/2008   Lieutenant Diego PT, DPT 8:49 AM,10/31/17 [facility] [address], [place], [address] Phone: [phone]   Fax:  [phone]  Name:  Norma Wall MRN: [id number] Date of Birth: 1970/12/16

## 2017-11-02 ENCOUNTER — Ambulatory Visit: Payer: BC Managed Care – PPO | Attending: Physician Assistant

## 2017-11-02 DIAGNOSIS — M6281 Muscle weakness (generalized): Secondary | ICD-10-CM | POA: Diagnosis not present

## 2017-11-02 DIAGNOSIS — G8929 Other chronic pain: Secondary | ICD-10-CM | POA: Insufficient documentation

## 2017-11-02 DIAGNOSIS — M25512 Pain in left shoulder: Secondary | ICD-10-CM | POA: Diagnosis not present

## 2017-11-02 DIAGNOSIS — M62838 Other muscle spasm: Secondary | ICD-10-CM | POA: Insufficient documentation

## 2017-11-02 NOTE — Therapy (Signed)
[facility] [address] [place], [address] Phone: [phone]   Fax:  [phone]  Physical Therapy Treatment  Patient Details  Name: Norma Wall MRN: [id number] Date of Birth: Feb 10, 1970 Referring Provider (PT): Fenton Malling   Encounter Date: 11/02/2017  PT End of Session - 11/02/17 1624    Visit Number  7    Number of Visits  16    Date for PT Re-Evaluation  11/29/17    Authorization Type  BCBS    PT Start Time  1621    PT Stop Time  1700    PT Time Calculation (min)  39 min    Activity Tolerance  Patient tolerated treatment well    Behavior During Therapy  [facility] for tasks assessed/performed       Past Medical History:  Diagnosis Date  . Abdominal pain, right upper quadrant 2013  . Endometriosis 01/2013  . Gallbladder polyp   . Ovarian cyst 2014   right ovary    Past Surgical History:  Procedure Laterality Date  . ABLATION  2012   cyst removal    There were no vitals filed for this visit.  Subjective Assessment - 11/02/17 1623    Subjective  Pt reports no pain at rest upon arrival. She states that she "tweaked" her shoulder yesterday but it is feeling alright now. No specific questions or concerns at this time.     Currently in Pain?  No/denies          TREATMENT  Manual Therapy STM/trigger point release to teres minor/lat/subscapularis in supine;  Therex:  R sidelying L shoulder ER with gentl manual resistance 2 x 10; R sidelying L shoulder abduction AROM to 90 2 x 10; R sidelying L scapular retraction/depression with manual resistance 2 x 10; Cross body posterior L capsule stretch 30s hold x 3 with scapula block by therapist Matrix rows 15# x 15, 25# x 15, pt still has not reached muscle fatigue; Matrix shoulder extension from shoulder height to waist 20# x 15;  Trigger Point Dry Needling (TDN), unbilled Education performed with patient regarding potential  benefit of TDN. Reviewed precautions and risks with patient. Reviewed special precautions/risks over lung fields which include pneumothorax. Reviewed signs and symptoms of pneumothorax and advised pt to go to ER immediately if these symptoms develop advise them of dry needling treatment. Adequate time spent with pt to ensure full understanding of TDN risks. Pt provided verbal consent to treatment. TDN performed to with 0.30 x 60 single needle placements to L rhomboid major/minor, subscapularis, and infraspinatus with local twitch response (LTR). Pistoning technique utilized. Improved pain-free motion following intervention.     Pt educated throughout session about proper posture and technique with exercises. Improved exercise technique, movement at target joints, use of target muscles after min to mod verbal, visual, tactile cues.     Patient with trigger points in L infraspinatus, teres major/minor that are TTP, continues to reproduce concordant pain. Pt with notable twitch with TDN to infraspinatus. Overall good motivation during session today. Pt encouraged to continue HEP and follow-up as scheduled. Will continue to progress soft tissue and strengthening. Pt will benefit from PT services to address deficits in strength and pain in order to return to full function at home and work.                PT Short Term Goals - 10/04/17 1804      PT SHORT TERM GOAL #  1   Title  Patient will be compliant with HEP at least 3x a week to demonstrate ability to self manage condition at home.     Time  4    Period  Weeks    Status  New    Target Date  11/01/17      PT SHORT TERM GOAL #2   Title  Patient will demonstrate improved functional ability exhibited by improved FOTO score by at least 10 points    Baseline  55    Time  4    Period  Weeks    Status  New    Target Date  11/01/17        PT Long Term Goals - 10/04/17 1806      PT LONG TERM GOAL #1   Title  Patient will demonstrate  improved L shoulder (and scapular) strength at least least 4+/5 to improve ability to perform functional tasks such as reaching, lifting, carrying.    Time  8    Period  Weeks    Status  New    Target Date  11/29/17      PT LONG TERM GOAL #2   Title  Patient will demonstrate improved ability to perform functional activities as well as work duties exhibited by improved FOTO score to 70 points or more.     Baseline  55    Time  8    Period  Weeks    Status  New    Target Date  11/29/17      PT LONG TERM GOAL #3   Title  Patient will report pain at worst with work activities as 2/10 or less to promote ease with necessary tasks.    Time  8    Period  Weeks    Status  New    Target Date  11/29/17      PT LONG TERM GOAL #4   Title  Patient will demonstrate improved postural awareness exhibited by ability to maintain proper posture for at least 56minutes without prompting/external cues via patient report.    Time  8    Period  Weeks    Status  New    Target Date  11/29/17            Plan - 11/02/17 1624    Clinical Impression Statement  Patient with trigger points in L infraspinatus, teres major/minor that are TTP, continues to reproduce concordant pain. Pt with notable twitch with TDN to infraspinatus. Overall good motivation during session today. Pt encouraged to continue HEP and follow-up as scheduled. Will continue to progress soft tissue and strengthening. Pt will benefit from PT services to address deficits in strength and pain in order to return to full function at home and work.     PT Frequency  2x / week    PT Duration  8 weeks    PT Treatment/Interventions  Electrical Stimulation;Traction;Moist Heat;Cryotherapy;DME Instruction;Therapeutic activities;Therapeutic exercise;Neuromuscular re-education;Dry needling;Patient/family education;Passive range of motion    PT Next Visit Plan  update HEP, shoulder mobs, scapular strengthening    PT Home Exercise Plan  scapular rows,  scapular row extensions, postural awareness, ER with proper posture, doorway stretch    Consulted and Agree with Plan of Care  Patient       Patient will benefit from skilled therapeutic intervention in order to improve the following deficits and impairments:  Decreased activity tolerance, Decreased endurance, Decreased strength, Improper body mechanics, Pain, Postural dysfunction, Impaired flexibility, Increased muscle spasms  Visit Diagnosis: Chronic left shoulder pain  Muscle weakness (generalized)     Problem List Patient Active Problem List   Diagnosis Date Noted  . Pruritus ani 08/24/2016  . History of rectal bleeding 08/24/2016  . Adult BMI 30+ 11/20/2014  . Dermatitis, eczematoid 11/20/2014  . Communicable disease contact 11/20/2014  . External hemorrhoid 11/20/2014  . High potassium 11/20/2014  . Feeling faint 11/20/2014  . Awareness of heartbeats 11/20/2014  . Feeling stressed out 11/20/2014  . Bursitis 11/20/2014  . Migraine 08/28/2014  . Gallbladder polyp 09/13/2012  . Avitaminosis D 05/19/2009  . Bloodgood disease 04/29/2009  . Excess, menstruation 04/24/2008  . Current tobacco use 04/24/2008   Phillips Grout PT, DPT, GCS  Kyheem Bathgate 11/03/2017, 1:48 PM  [facility] [address] [place], [address] Phone: [phone]   Fax:  [phone]  Name: Norma Wall MRN: [id number] Date of Birth: [birth year]

## 2017-11-07 ENCOUNTER — Ambulatory Visit: Payer: BC Managed Care – PPO

## 2017-11-09 ENCOUNTER — Ambulatory Visit: Payer: BC Managed Care – PPO

## 2017-11-09 DIAGNOSIS — M25512 Pain in left shoulder: Secondary | ICD-10-CM

## 2017-11-09 DIAGNOSIS — M6281 Muscle weakness (generalized): Secondary | ICD-10-CM | POA: Diagnosis not present

## 2017-11-09 DIAGNOSIS — M62838 Other muscle spasm: Secondary | ICD-10-CM

## 2017-11-09 DIAGNOSIS — G8929 Other chronic pain: Secondary | ICD-10-CM | POA: Diagnosis not present

## 2017-11-09 NOTE — Therapy (Signed)
[facility] [address], [place], [address] Phone: [phone]   Fax:  [phone]  Physical Therapy Treatment  Patient Details  Name: Norma Wall MRN: [id number] Date of Birth: 03/14/1970 Referring Provider (PT): Fenton Malling   Encounter Date: 11/09/2017  PT End of Session - 11/09/17 1605    Visit Number  8    Number of Visits  16    Date for PT Re-Evaluation  11/29/17    Authorization Type  BCBS    PT Start Time  1600    PT Stop Time  1645    PT Time Calculation (min)  45 min    Activity Tolerance  Patient tolerated treatment well    Behavior During Therapy  [facility] for tasks assessed/performed       Past Medical History:  Diagnosis Date  . Abdominal pain, right upper quadrant 2013  . Endometriosis 01/2013  . Gallbladder polyp   . Ovarian cyst 2014   right ovary    Past Surgical History:  Procedure Laterality Date  . ABLATION  2012   cyst removal    There were no vitals filed for this visit.  Subjective Assessment - 11/09/17 1608    Subjective  pt reports no pain at rest. She states her pain is positional. She states it helped for the therapist at last visit to work on her left thoracic region. States she has been using tennis ball since last time. States some of the dry needling at last treatment was painful but that it was helpful overall.     Currently in Pain?  Other (Comment)   Patient denies pain at rest. States she feels familiar pain when reaching left shoulder in horizontal abduciton and extension.    Pain Score  2     Pain Location  Shoulder    Pain Orientation  Left;Anterior;Proximal;Lateral    Pain Descriptors / Indicators  Aching    Pain Type  Chronic pain       TREATMENT  Manual Therapy STM/trigger point release to teres minor/infraspinatus in prone x13mins  Therex: to improve strength, activity tolerance, range of motion for functional activities.  Required multimodal cuing for proper form and to learn exercises.  Standing B/L rhomboid doorway stretch 3x30s Standing B/L pec minor doorway stretch 3x30s Wall push ups with plus x15 Bent rows with 10# dumbbell 2 x15 Shoulder extensions with BTB 2x15 tricep extension 2 x 15 with BTB IR step ups outs with GTB b/l x10 ER step outs with YTB b/l x15 on L    Patient response to treatment: pt reports mild increased achiness in left shoulder at end of session but no increase in pain. Tolerated progressions in strengthening exercises well. Pt demo improved awareness of posture but required multiple cues to maintain left UE position during activities.     PT Education - 11/09/17 1658    Education Details  therex technique and form, POC    Person(s) Educated  Patient    Methods  Explanation;Demonstration;Tactile cues;Verbal cues    Comprehension  Verbalized understanding;Returned demonstration       PT Short Term Goals - 11/09/17 1650      PT SHORT TERM GOAL #1   Title  Patient will be compliant with HEP at least 3x a week to demonstrate ability to self manage condition at home.     Baseline  reports performance of HEP every other day    Time  4  Period  Weeks    Status  Achieved    Target Date  11/09/17      PT SHORT TERM GOAL #2   Title  Patient will demonstrate improved functional ability exhibited by improved FOTO score by at least 10 points    Baseline  baseline: 55 ; 68 11/09/2017    Time  4    Period  Weeks    Status  Achieved    Target Date  11/09/17        PT Long Term Goals - 10/04/17 1806      PT LONG TERM GOAL #1   Title  Patient will demonstrate improved L shoulder (and scapular) strength at least least 4+/5 to improve ability to perform functional tasks such as reaching, lifting, carrying.    Time  8    Period  Weeks    Status  New    Target Date  11/29/17      PT LONG TERM GOAL #2   Title  Patient will demonstrate improved ability to perform functional  activities as well as work duties exhibited by improved FOTO score to 70 points or more.     Baseline  55    Time  8    Period  Weeks    Status  New    Target Date  11/29/17      PT LONG TERM GOAL #3   Title  Patient will report pain at worst with work activities as 2/10 or less to promote ease with necessary tasks.    Time  8    Period  Weeks    Status  New    Target Date  11/29/17      PT LONG TERM GOAL #4   Title  Patient will demonstrate improved postural awareness exhibited by ability to maintain proper posture for at least 8minutes without prompting/external cues via patient report.    Time  8    Period  Weeks    Status  New    Target Date  11/29/17         Plan - 11/09/17 1659    Clinical Impression Statement  pt reports mild increased achiness in left shoulder at end of session but no increase in pain. Tolerated progressions in strengthening exercises well. Pt demo improved awareness of posture but required multiple cues to maintain left UE position during activities.  Pt  was very tender to palpatoin to left infraspinatus and teres minor with trigger points present. Mild improvement in tissue tension in response to manual therapy.     PT Frequency  2x / week    PT Duration  8 weeks    PT Treatment/Interventions  Electrical Stimulation;Traction;Moist Heat;Cryotherapy;DME Instruction;Therapeutic activities;Therapeutic exercise;Neuromuscular re-education;Dry needling;Patient/family education;Passive range of motion    PT Next Visit Plan  update HEP, shoulder mobs, scapular/GH strengthening, consider trigger point dry needling.     PT Home Exercise Plan  scapular rows, scapular row extensions, bent over rows, pec minor and rhomboid stretch, postural awareness, ER with proper posture, doorway stretch    Consulted and Agree with Plan of Care  Patient       Patient will benefit from skilled therapeutic intervention in order to improve the following deficits and impairments:   Decreased activity tolerance, Decreased endurance, Decreased strength, Improper body mechanics, Pain, Postural dysfunction, Impaired flexibility, Increased muscle spasms  Visit Diagnosis: Muscle weakness (generalized)  Chronic left shoulder pain  Other muscle spasm     Problem List Patient Active Problem  List   Diagnosis Date Noted  . Pruritus ani 08/24/2016  . History of rectal bleeding 08/24/2016  . Adult BMI 30+ 11/20/2014  . Dermatitis, eczematoid 11/20/2014  . Communicable disease contact 11/20/2014  . External hemorrhoid 11/20/2014  . High potassium 11/20/2014  . Feeling faint 11/20/2014  . Awareness of heartbeats 11/20/2014  . Feeling stressed out 11/20/2014  . Bursitis 11/20/2014  . Migraine 08/28/2014  . Gallbladder polyp 09/13/2012  . Avitaminosis D 05/19/2009  . Bloodgood disease 04/29/2009  . Excess, menstruation 04/24/2008  . Current tobacco use 04/24/2008    Lieutenant Diego PT, DPT 5:09 PM,11/09/17 [facility] [address] [place], [address] Phone: [phone]   Fax:  [phone]  Name: Ambry Dix MRN: [id number] Date of Birth: Mar 17, 1970

## 2017-11-14 ENCOUNTER — Ambulatory Visit: Payer: BC Managed Care – PPO

## 2017-11-15 DIAGNOSIS — F4322 Adjustment disorder with anxiety: Secondary | ICD-10-CM | POA: Diagnosis not present

## 2017-11-16 ENCOUNTER — Other Ambulatory Visit: Payer: Self-pay

## 2017-11-16 ENCOUNTER — Encounter: Payer: Self-pay | Admitting: Physical Therapy

## 2017-11-16 ENCOUNTER — Ambulatory Visit: Payer: BC Managed Care – PPO | Admitting: Physical Therapy

## 2017-11-16 DIAGNOSIS — M25512 Pain in left shoulder: Secondary | ICD-10-CM

## 2017-11-16 DIAGNOSIS — M62838 Other muscle spasm: Secondary | ICD-10-CM

## 2017-11-16 DIAGNOSIS — M6281 Muscle weakness (generalized): Secondary | ICD-10-CM

## 2017-11-16 DIAGNOSIS — G8929 Other chronic pain: Secondary | ICD-10-CM

## 2017-11-16 NOTE — Therapy (Signed)
[facility] [address] [place], [address] Phone: [phone]   Fax:  [phone]  Physical Therapy Treatment  Patient Details  Name: Norma Wall MRN: [id number] Date of Birth: 06-Mar-1970 Referring Provider (PT): Fenton Malling   Encounter Date: 11/16/2017  PT End of Session - 11/16/17 1730    Visit Number  9    Number of Visits  16    Date for PT Re-Evaluation  11/29/17    Authorization Type  BCBS    PT Start Time  1605    PT Stop Time  1650    PT Time Calculation (min)  45 min    Activity Tolerance  Patient tolerated treatment well    Behavior During Therapy  [facility] for tasks assessed/performed       Past Medical History:  Diagnosis Date  . Abdominal pain, right upper quadrant 2013  . Endometriosis 01/2013  . Gallbladder polyp   . Ovarian cyst 2014   right ovary    Past Surgical History:  Procedure Laterality Date  . ABLATION  2012   cyst removal    There were no vitals filed for this visit.  Subjective Assessment - 11/16/17 1605    Subjective  Pt reports her shoulder is "not bad." She feels it is definitely better and not keeping her from doing anything. She states it only bothers her some nights. She reports a little soreness after her last visit and it subsided after a few hours. Compliant with HEP.     Pertinent History  Patient is 47 yo female that complains of L shoulder pain that started around December 2018. Reports at work her bag that she carried around got heavier due to increased caseload. States she was busier at work and needed to carry more supplies around. Also reports an increase of stress and tension in shoulders. Experiences most pain at end of day, especially sleeping. Also complains of pain during weight bearing through elbow, functional activities such as reaching, lifting, carrying, driving.     Limitations  Other (comment)   sleeping   Currently in Pain?   No/denies    Pain Score  0-No pain   tense feeling in that left scapular region       TREATMENT  Therex:to improve strength, activity tolerance, range of motion for functional activities. Required multimodal cuing for proper form and to learn exercises.  - Standing B/L rhomboid doorway stretch 3x30s prior to dry needling, and 1x30 seconds each side following needling.  - Wall push ups with plus x15 - Reviewed HEP, discussed goals, and POC.   Trigger Point Dry Needling (TDN), unbilled, adminstered by Blythe Stanford, PT DPT Education performed with patient regarding potential benefit of TDN. Reviewed precautions and risks with patient. Reviewed special precautions/risks over lung fields which include pneumothorax. Reviewed signs and symptoms of pneumothorax and advised pt to go to ER immediately if these symptoms develop advise them of dry needling treatment. Adequate time spent with pt to ensure full understanding of TDN risks. Pt provided verbal consent to treatment. TDN performed to with 0.25 x 9mm and .25 x 33mm and single needle placements to Latissimus dorsi, teres minor and infraspinatus. Patient positioned in supine and prone throughout treatment. Pistoning technique utilized. Improved pain-free motion following intervention.     PT Education - 11/16/17 1729    Education Details  therex technique and form, POC, what to expect following needling.     Person(s) Educated  Patient    Methods  Explanation;Demonstration    Comprehension  Verbalized understanding;Returned demonstration       PT Short Term Goals - 11/16/17 1733      PT SHORT TERM GOAL #1   Title  Patient will be compliant with HEP at least 3x a week to demonstrate ability to self manage condition at home.     Baseline  reports performance of HEP every other day    Time  4    Period  Weeks    Status  Achieved    Target Date  11/09/17      PT SHORT TERM GOAL #2   Title  Patient will demonstrate improved  functional ability exhibited by improved FOTO score by at least 10 points    Baseline  baseline: 55 ; 68 11/09/2017    Time  4    Period  Weeks    Status  Achieved    Target Date  11/09/17        PT Long Term Goals - 10/04/17 1806      PT LONG TERM GOAL #1   Title  Patient will demonstrate improved L shoulder (and scapular) strength at least least 4+/5 to improve ability to perform functional tasks such as reaching, lifting, carrying.    Time  8    Period  Weeks    Status  New    Target Date  11/29/17      PT LONG TERM GOAL #2   Title  Patient will demonstrate improved ability to perform functional activities as well as work duties exhibited by improved FOTO score to 70 points or more.     Baseline  55    Time  8    Period  Weeks    Status  New    Target Date  11/29/17      PT LONG TERM GOAL #3   Title  Patient will report pain at worst with work activities as 2/10 or less to promote ease with necessary tasks.    Time  8    Period  Weeks    Status  New    Target Date  11/29/17      PT LONG TERM GOAL #4   Title  Patient will demonstrate improved postural awareness exhibited by ability to maintain proper posture for at least 16minutes without prompting/external cues via patient report.    Time  8    Period  Weeks    Status  New    Target Date  11/29/17         Plan - 11/16/17 1731    Clinical Impression Statement  Patient response to treatment: pt reports mild increased achiness in left shoulder, especially near dry needling sites at end of session but no increase in pain. Noted reduction in tightness with rhomboid/lat stretch in doorway following needling. Pt notes that needling in the axillary region felt like it included one of the main areas that she feels pain. Pt demo continued  improved awareness of posture.     PT Frequency  2x / week    PT Duration  8 weeks    PT Treatment/Interventions  Electrical Stimulation;Traction;Moist Heat;Cryotherapy;DME  Instruction;Therapeutic activities;Therapeutic exercise;Neuromuscular re-education;Dry needling;Patient/family education;Passive range of motion    PT Next Visit Plan  complete re-assessment, update HEP, progress scapular/GH strengthening, consider trigger point dry needling.     PT Home Exercise Plan  scapular rows, scapular row extensions, bent over rows, pec minor and rhomboid stretch, postural awareness,  ER with proper posture, doorway stretch    Consulted and Agree with Plan of Care  Patient       Patient will benefit from skilled therapeutic intervention in order to improve the following deficits and impairments:  Decreased activity tolerance, Decreased endurance, Decreased strength, Improper body mechanics, Pain, Postural dysfunction, Impaired flexibility, Increased muscle spasms  Visit Diagnosis: Muscle weakness (generalized)  Chronic left shoulder pain  Other muscle spasm    Problem List Patient Active Problem List   Diagnosis Date Noted  . Pruritus ani 08/24/2016  . History of rectal bleeding 08/24/2016  . Adult BMI 30+ 11/20/2014  . Dermatitis, eczematoid 11/20/2014  . Communicable disease contact 11/20/2014  . External hemorrhoid 11/20/2014  . High potassium 11/20/2014  . Feeling faint 11/20/2014  . Awareness of heartbeats 11/20/2014  . Feeling stressed out 11/20/2014  . Bursitis 11/20/2014  . Migraine 08/28/2014  . Gallbladder polyp 09/13/2012  . Avitaminosis D 05/19/2009  . Bloodgood disease 04/29/2009  . Excess, menstruation 04/24/2008  . Current tobacco use 04/24/2008    Everlean Alstrom. Graylon Good, PT, DPT 11/16/17, 5:56 PM  [facility] [address] [place], [address] Phone: [phone]   Fax:  [phone]  Name: Norma Wall MRN: [id number] Date of Birth: 22-Mar-1970

## 2017-11-17 ENCOUNTER — Encounter: Payer: Self-pay | Admitting: Physician Assistant

## 2017-11-17 DIAGNOSIS — R748 Abnormal levels of other serum enzymes: Secondary | ICD-10-CM

## 2017-11-22 ENCOUNTER — Ambulatory Visit: Payer: BC Managed Care – PPO | Admitting: Physical Therapy

## 2017-11-23 ENCOUNTER — Ambulatory Visit: Payer: BC Managed Care – PPO | Admitting: Physical Therapy

## 2017-11-23 ENCOUNTER — Encounter: Payer: Self-pay | Admitting: Physical Therapy

## 2017-11-23 DIAGNOSIS — G8929 Other chronic pain: Secondary | ICD-10-CM | POA: Diagnosis not present

## 2017-11-23 DIAGNOSIS — M25512 Pain in left shoulder: Secondary | ICD-10-CM | POA: Diagnosis not present

## 2017-11-23 DIAGNOSIS — M62838 Other muscle spasm: Secondary | ICD-10-CM | POA: Diagnosis not present

## 2017-11-23 DIAGNOSIS — M6281 Muscle weakness (generalized): Secondary | ICD-10-CM

## 2017-11-23 NOTE — Patient Instructions (Signed)
LONG TERM HOME EXERCISE PROGRAM Access Code: [email or web address]  URL: [email or web address]  Date: 11/23/2017  Prepared by: Rosita Kea   Exercises  Correct Seated Posture - 1 sets - 2-3x daily - 7x weekly  Doorway Pec Stretch at 90 Degrees Abduction - 3 reps - 1 sets - 30 second hold - 1x daily - 7x weekly  Doorway Rhomboid Stretch - 1 reps - 30 second hold - 3 sets - 1x daily - 3x weekly  Wall Push Up - 10-15 reps - 1 second hold - 3 Sets - 1x daily - 3x weekly  Bent Over Single Arm Shoulder Row with Dumbbell - 10-15 reps - 1 second hold - 3 Sets - 1x daily - 3x weekly  Scapular Retraction with Resistance - 10 reps - 1 sets - 1x daily - 4x weekly  Scapular Retraction with Resistance Advanced - 10-15 reps - 3 sets - 1x daily - 4x weekly  Standing Elbow Extension with Anchored Resistance - 10-15 reps - 1 second hold - 3 Sets - 1x daily - 3x weekly  Shoulder External Rotation and Scapular Retraction with Resistance - 15 reps - 3 sets - 1x daily - 4x weekly  Shoulder External Rotation with Anchored Resistance - 10-15 reps - 1 second hold - 3 Sets - 1x daily - 3x weekly  Shoulder External Rotation Reactive Isometrics - 10-15 reps - 1 second hold - 3 Sets - 1x daily - 3x weekly  Standing Shoulder Internal Rotation with Anchored Resistance - 10-15 reps - 1 second hold - 3 Sets - 1x daily - 3x weekly  Prone Scapular Retraction - 10-15 reps - 1 second hold - 3 Sets - 1x daily - 3x weekly  Prone Scapular Retraction Arms at Side - 10-15 reps - 1 second hold - 3 Sets - 1x daily - 3x weekly  Prone W Scapular Retraction - 10-15 reps - 1 second hold - 3 Sets - 1x daily - 3x weekly  Prone Scapular Retraction Y - 10-15 reps - 1 second hold - 3 Sets - 1x daily - 3x weekly

## 2017-11-23 NOTE — Therapy (Signed)
[facility] [address], [place], [address] Phone: [phone]   Fax:  [phone]  Physical Therapy Progress Note and Treatment  Dates of reporting period  10/04/2017  through 11/23/2017   Patient Details  Name: Norma Wall MRN: [id number] Date of Birth: November 27, 1970 Referring Provider (PT): Fenton Malling   Encounter Date: 11/23/2017  PT End of Session - 11/23/17 1618    Visit Number  10    Number of Visits  16    Date for PT Re-Evaluation  11/29/17    Authorization Type  [employer]    Authorization Time Period  Cert period: 08/04/812 through 11/29/2017, (last PN 11/23/2017)    Authorization - Visit Number  10    Authorization - Number of Visits  10    PT Start Time  1619    PT Stop Time  1705    PT Time Calculation (min)  46 min    Equipment Utilized During Treatment  Other (comment)   therabands   Activity Tolerance  Patient tolerated treatment well;No increased pain    Behavior During Therapy  WFL for tasks assessed/performed       Past Medical History:  Diagnosis Date  . Abdominal pain, right upper quadrant 2013  . Endometriosis 01/2013  . Gallbladder polyp   . Ovarian cyst 2014   right ovary    Past Surgical History:  Procedure Laterality Date  . ABLATION  2012   cyst removal    There were no vitals filed for this visit.  Subjective Assessment - 11/23/17 1620    Subjective  Patient states she is doing pretty good today but busy. Since last visit she was out at her mother's house trying to cut a 1.5 inch diameter branch and realized her arm is not as strong as it was before. She states her shoulder is much better and with the difficulty of getting here since school has started she feels like she thinks she would be okay discontinuing PT today. She states she has some financial concerns with how much it will cost to continue.   Pt agrees she would like to tentitively make today her  last visit but check in by phone in about 2 weeks before final discharge.     Pertinent History  Patient is 47 yo female that complains of L shoulder pain that started around December 2018. Reports at work her bag that she carried around got heavier due to increased caseload. States she was busier at work and needed to carry more supplies around. Also reports an increase of stress and tension in shoulders. Experiences most pain at end of day, especially sleeping. Also complains of pain during weight bearing through elbow, functional activities such as reaching, lifting, carrying, driving.     Limitations  Other (comment);Lifting   sleeping (a little), activities that require internal rotation and horizontal adduction.   Diagnostic tests  Xray (patient reports that her physician said it was clear)    Patient Stated Goals  Decrease pain, ability to perform functional/recreational activities    Currently in Pain?  No/denies    Pain Onset  More than a month ago    Pain Frequency  Intermittent    Aggravating Factors   worse at night, worse when moving left shoulder into horizontal abduction and internal rotation    Effect of Pain on Daily Activities  impedes functional activities slightly         [facility] PT Assessment -  11/23/17 0001      Strength   Right Shoulder Flexion  4+/5    Right Shoulder ABduction  4+/5    Right Shoulder Internal Rotation  4+/5    Right Shoulder External Rotation  4+/5    Left Shoulder Flexion  4/5   mild ache   Left Shoulder ABduction  4+/5   mild pain   Left Shoulder Internal Rotation  4+/5   aching   Left Shoulder External Rotation  4+/5         PT Education - 11/23/17 1712    Education Details  Discussed progress, POC, long term HEP techniques and exercises at length. Cuing for proper completion and advice for how to advance exercises over time.     Person(s) Educated  Patient    Methods  Explanation;Demonstration;Handout    Comprehension  Verbalized  understanding;Returned demonstration      TREATMENT:  Therapeutic exercise: to improve strength, activity tolerance, range of motion for functional activities. Required multimodal cuing for proper form and to learn exercises.  - Measurements to assess progress (see above) - Review of all exercises in long term HEP with practice and demonstration of how to perform and advance exercises at home. (see below) - Prone scapular strengthening: I's, T's, Y's, and W's with cuing for form and purpose, x 10 each.  - standing rows with red theraband, x30 - standing bilateral shoulder extension with scapular retraction with red theraband x 10 - standing left shoulder ER and IR with double yellow theraband, dynamic isometric x 5, isotonic x 10 - Standing B/L rhomboid/lat doorway stretch 1x30 seconds each side.  - Wall push ups with plus x15 - Reviewed HEP, discussed goals, and POC.   LONG TERM HOME EXERCISE PROGRAM Provided yellow, green, and blue therabands Access Code: [email or web address]  URL: [email or web address]  Date: 11/23/2017  Prepared by: Rosita Kea   Exercises  Correct Seated Posture - 1 sets - 2-3x daily - 7x weekly  Doorway Pec Stretch at 90 Degrees Abduction - 3 reps - 1 sets - 30 second hold - 1x daily - 7x weekly  Doorway Rhomboid Stretch - 1 reps - 30 second hold - 3 sets - 1x daily - 3x weekly  Wall Push Up - 10-15 reps - 1 second hold - 3 Sets - 1x daily - 3x weekly  Bent Over Single Arm Shoulder Row with Dumbbell - 10-15 reps - 1 second hold - 3 Sets - 1x daily - 3x weekly  Scapular Retraction with Resistance - 10 reps - 1 sets - 1x daily - 4x weekly  Scapular Retraction with Resistance Advanced - 10-15 reps - 3 sets - 1x daily - 4x weekly  Standing Elbow Extension with Anchored Resistance - 10-15 reps - 1 second hold - 3 Sets - 1x daily - 3x weekly  Shoulder External Rotation and Scapular Retraction with Resistance - 15 reps - 3 sets - 1x daily - 4x weekly  Shoulder  External Rotation with Anchored Resistance - 10-15 reps - 1 second hold - 3 Sets - 1x daily - 3x weekly  Shoulder External Rotation Reactive Isometrics - 10-15 reps - 1 second hold - 3 Sets - 1x daily - 3x weekly  Standing Shoulder Internal Rotation with Anchored Resistance - 10-15 reps - 1 second hold - 3 Sets - 1x daily - 3x weekly  Prone Scapular Retraction - 10-15 reps - 1 second hold - 3 Sets - 1x daily - 3x weekly  Prone Scapular Retraction Arms at  Side - 10-15 reps - 1 second hold - 3 Sets - 1x daily - 3x weekly  Prone W Scapular Retraction - 10-15 reps - 1 second hold - 3 Sets - 1x daily - 3x weekly  Prone Scapular Retraction Y - 10-15 reps - 1 second hold - 3 Sets - 1x daily - 3x weekly   Patient response to treatment:  Pt tolerated treatment well. Pt was able to complete all exercises with minimal to no lasting increase in pain or discomfort. Pt required cuing for proper technique and to facilitate improved neuromuscular control, strength, range of motion, and functional ability. She expressed understanding of exercises in HEP and understood how to get in touch with PT for questions later.    PT Short Term Goals - 11/23/17 1715      PT SHORT TERM GOAL #1   Title  Patient will be compliant with HEP at least 3x a week to demonstrate ability to self manage condition at home.     Baseline  reports performance of HEP every other day    Time  4    Period  Weeks    Status  Achieved    Target Date  11/09/17      PT SHORT TERM GOAL #2   Title  Patient will demonstrate improved functional ability exhibited by improved FOTO score by at least 10 points    Baseline  baseline: 55 ; 68 11/09/2017    Time  4    Period  Weeks    Status  Achieved    Target Date  11/09/17        PT Long Term Goals - 11/23/17 1717      PT LONG TERM GOAL #1   Title  Patient will demonstrate improved L shoulder (and scapular) strength at least least 4+/5 to improve ability to perform functional tasks such as  reaching, lifting, carrying.    Time  8    Period  Weeks    Status  Achieved    Target Date  11/29/17      PT LONG TERM GOAL #2   Title  Patient will demonstrate improved ability to perform functional activities as well as work duties exhibited by improved FOTO score to 70 points or more.     Baseline  baseline: 55 ; 68 (11/09/2017); 70 (11/23/2017)    Time  8    Period  Weeks    Status  Achieved    Target Date  11/29/17      PT LONG TERM GOAL #3   Title  Patient will report pain at worst with work activities as 2/10 or less to promote ease with necessary tasks.    Baseline  Notes: 1/10 with modifications in activity (11/23/2017)    Time  8    Period  Weeks    Status  Achieved    Target Date  11/29/17      PT LONG TERM GOAL #4   Title  Patient will demonstrate improved postural awareness exhibited by ability to maintain proper posture for at least 13mnutes without prompting/external cues via patient report.    Time  8    Period  Weeks    Status  Achieved    Target Date  11/29/17      ASSESSMENT of PROGRESS:  Patient has attended 10 physical therapy sessions and has made good progress towards goals. She has met all short and long term goals at this point. She shows improved strength, less pain  with range of motion, improved activity tolerance, and improved FOTO score. She demonstrates good understanding of her HEP and requires mild cuing and education for proper form and performance of exercises. Patient continues to report discomfort and weakness that are affecting her sleep, work, and leisure activities but states it has gotten much better. She would benefit from further PT to address these ongoing impairments and functional limitations but she has expressed she would like to try continuing her improvement independently with a focus on progressive strengthening from HEP and stress relief. Plan to follow up with patient in 2 weeks by phone and discharge at that time if she continues to  feel confident in continuing care independently as achieving independence in self-management is beneficial for long term recovery.  .   Plan - 11/23/17 1842    Clinical Impression Statement  Patient has attended 10 physical therapy sessions and has made good progress towards goals. She has met all short and long term goals at this point. She shows improved strength, less pain with range of motion, improved activity tolerance, and improved FOTO score. She demonstrates good understanding of her HEP and requires mild cuing and education for proper form and performance of exercises. Patient continues to report discomfort and weakness that are affecting her sleep, work, and leisure activities but states it has gotten much better. She would benefit from further PT to address these ongoing impairments and functional limitations but she has expressed she would like to try continuing her improvement independently with a focus on progressive strengthening from HEP and stress relief. Plan to follow up with patient in 2 weeks by phone and discharge at that time if she continues to feel confident in continuing care independently as achieving independence in self-management is beneficial for long term recovery.    PT Frequency  2x / week    PT Duration  8 weeks    PT Treatment/Interventions  Electrical Stimulation;Traction;Moist Heat;Cryotherapy;DME Instruction;Therapeutic activities;Therapeutic exercise;Neuromuscular re-education;Dry needling;Patient/family education;Passive range of motion    PT Next Visit Plan   follow up with patient in 2 weeks by phone and discharge at that time if she continues to feel confident in continuing care independently     [place] Access Code: [email or web address]     Consulted and Agree with Plan of Care  Patient       Patient would benefit from skilled therapeutic intervention in order to improve the following deficits and impairments:  Decreased activity tolerance,  Decreased endurance, Decreased strength, Improper body mechanics, Pain, Postural dysfunction, Impaired flexibility, Increased muscle spasms  Visit Diagnosis: Muscle weakness (generalized)  Chronic left shoulder pain  Other muscle spasm     Problem List Patient Active Problem List   Diagnosis Date Noted  . Pruritus ani 08/24/2016  . History of rectal bleeding 08/24/2016  . Adult BMI 30+ 11/20/2014  . Dermatitis, eczematoid 11/20/2014  . Communicable disease contact 11/20/2014  . External hemorrhoid 11/20/2014  . High potassium 11/20/2014  . Feeling faint 11/20/2014  . Awareness of heartbeats 11/20/2014  . Feeling stressed out 11/20/2014  . Bursitis 11/20/2014  . Migraine 08/28/2014  . Gallbladder polyp 09/13/2012  . Avitaminosis D 05/19/2009  . Bloodgood disease 04/29/2009  . Excess, menstruation 04/24/2008  . Current tobacco use 04/24/2008    Nancy Nordmann, PT, DPT 11/23/2017, 6:44 PM  [facility] [address] [place], [address] Phone: [phone]   Fax:  [phone]  Name:  Norma Wall MRN: [id number] Date of Birth: 03/21/1970

## 2017-11-28 ENCOUNTER — Ambulatory Visit: Payer: BC Managed Care – PPO | Admitting: Physical Therapy

## 2017-11-30 ENCOUNTER — Encounter: Payer: BC Managed Care – PPO | Admitting: Physical Therapy

## 2017-12-02 ENCOUNTER — Encounter: Payer: Self-pay | Admitting: Physician Assistant

## 2017-12-02 LAB — HEPATIC FUNCTION PANEL
ALBUMIN: 4.4 g/dL (ref 3.5–5.5)
ALT: 39 IU/L — ABNORMAL HIGH (ref 0–32)
AST: 28 IU/L (ref 0–40)
Alkaline Phosphatase: 50 IU/L (ref 39–117)
Bilirubin Total: 0.5 mg/dL (ref 0.0–1.2)
Bilirubin, Direct: 0.12 mg/dL (ref 0.00–0.40)
Total Protein: 6.7 g/dL (ref 6.0–8.5)

## 2017-12-23 DIAGNOSIS — F4322 Adjustment disorder with anxiety: Secondary | ICD-10-CM | POA: Diagnosis not present

## 2017-12-28 DIAGNOSIS — F4322 Adjustment disorder with anxiety: Secondary | ICD-10-CM | POA: Diagnosis not present

## 2018-01-02 ENCOUNTER — Telehealth: Payer: BC Managed Care – PPO | Admitting: Family

## 2018-01-02 DIAGNOSIS — M545 Low back pain, unspecified: Secondary | ICD-10-CM

## 2018-01-02 MED ORDER — BACLOFEN 10 MG PO TABS: 10.0000 mg | ORAL_TABLET | Freq: Three times a day (TID) | ORAL | 0 refills | Status: DC | PRN

## 2018-01-02 MED ORDER — ETODOLAC 300 MG PO CAPS: 300.0000 mg | ORAL_CAPSULE | Freq: Two times a day (BID) | ORAL | 0 refills | Status: DC

## 2018-01-02 NOTE — Progress Notes (Signed)
Thank you for the details you included in the comment boxes. Those details are very helpful in determining the best course of treatment for you and help [place] to provide the best care.  We are sorry that you are not feeling well.  Here is how we plan to help!  Based on what you have shared with me it looks like you mostly have acute back pain.  Acute back pain is defined as musculoskeletal pain that can resolve in 1-3 weeks with conservative treatment.  I have prescribed Etodolac 300 mg twice a day non-steroid anti-inflammatory (NSAID) as well as Baclofen 10 mg every eight hours as needed which is a muscle relaxer  Some patients experience stomach irritation or in increased heartburn with anti-inflammatory drugs.  Please keep in mind that muscle relaxer's can cause fatigue and should not be taken while at work or driving.  Back pain is very common.  The pain often gets better over time.  The cause of back pain is usually not dangerous.  Most people can learn to manage their back pain on their own.  Home Care  Stay active.  Start with short walks on flat ground if you can.  Try to walk farther each day.  Do not sit, drive or stand in one place for more than 30 minutes.  Do not stay in bed.  Do not avoid exercise or work.  Activity can help your back heal faster.  Be careful when you bend or lift an object.  Bend at your knees, keep the object close to you, and do not twist.  Sleep on a firm mattress.  Lie on your side, and bend your knees.  If you lie on your back, put a pillow under your knees.  Only take medicines as told by your doctor.  Put ice on the injured area.  Put ice in a plastic bag  Place a towel between your skin and the bag  Leave the ice on for 15-20 minutes, 3-4 times a day for the first 2-3 days. 210 After that, you can switch between ice and heat packs.  Ask your doctor about back exercises or massage.  Avoid feeling anxious or stressed.  Find good ways to deal with  stress, such as exercise.  Get Help Right Way If:  Your pain does not go away with rest or medicine.  Your pain does not go away in 1 week.  You have new problems.  You do not feel well.  The pain spreads into your legs.  You cannot control when you poop (bowel movement) or pee (urinate)  You feel sick to your stomach (nauseous) or throw up (vomit)  You have belly (abdominal) pain.  You feel like you may pass out (faint).  If you develop a fever.  Make Sure you:  Understand these instructions.  Will watch your condition  Will get help right away if you are not doing well or get worse.  Your e-visit answers were reviewed by a board certified advanced clinical practitioner to complete your personal care plan.  Depending on the condition, your plan could have included both over the counter or prescription medications.  If there is a problem please reply  once you have received a response from your provider.  Your safety is important to [place].  If you have drug allergies check your prescription carefully.    You can use MyChart to ask questions about today's visit, request a non-urgent call back, or ask for a work  or school excuse for 24 hours related to this e-Visit. If it has been greater than 24 hours you will need to follow up with your provider, or enter a new e-Visit to address those concerns.  You will get an e-mail in the next two days asking about your experience.  I hope that your e-visit has been valuable and will speed your recovery. Thank you for using e-visits.

## 2018-01-04 DIAGNOSIS — M5412 Radiculopathy, cervical region: Secondary | ICD-10-CM | POA: Diagnosis not present

## 2018-01-04 DIAGNOSIS — M9902 Segmental and somatic dysfunction of thoracic region: Secondary | ICD-10-CM | POA: Diagnosis not present

## 2018-01-04 DIAGNOSIS — M9901 Segmental and somatic dysfunction of cervical region: Secondary | ICD-10-CM | POA: Diagnosis not present

## 2018-01-04 DIAGNOSIS — M62830 Muscle spasm of back: Secondary | ICD-10-CM | POA: Diagnosis not present

## 2018-01-05 DIAGNOSIS — M9901 Segmental and somatic dysfunction of cervical region: Secondary | ICD-10-CM | POA: Diagnosis not present

## 2018-01-05 DIAGNOSIS — M5412 Radiculopathy, cervical region: Secondary | ICD-10-CM | POA: Diagnosis not present

## 2018-01-05 DIAGNOSIS — M62830 Muscle spasm of back: Secondary | ICD-10-CM | POA: Diagnosis not present

## 2018-01-05 DIAGNOSIS — M9902 Segmental and somatic dysfunction of thoracic region: Secondary | ICD-10-CM | POA: Diagnosis not present

## 2018-01-06 ENCOUNTER — Encounter: Payer: Self-pay | Admitting: Physician Assistant

## 2018-01-06 DIAGNOSIS — M62838 Other muscle spasm: Secondary | ICD-10-CM

## 2018-01-06 DIAGNOSIS — M792 Neuralgia and neuritis, unspecified: Secondary | ICD-10-CM

## 2018-01-06 DIAGNOSIS — M5412 Radiculopathy, cervical region: Secondary | ICD-10-CM

## 2018-01-06 DIAGNOSIS — M9901 Segmental and somatic dysfunction of cervical region: Secondary | ICD-10-CM | POA: Diagnosis not present

## 2018-01-06 DIAGNOSIS — M62830 Muscle spasm of back: Secondary | ICD-10-CM | POA: Diagnosis not present

## 2018-01-06 DIAGNOSIS — M9902 Segmental and somatic dysfunction of thoracic region: Secondary | ICD-10-CM | POA: Diagnosis not present

## 2018-01-06 MED ORDER — CYCLOBENZAPRINE HCL 5 MG PO TABS: 5.0000 mg | ORAL_TABLET | Freq: Three times a day (TID) | ORAL | 1 refills | Status: DC | PRN

## 2018-01-06 MED ORDER — METHYLPREDNISOLONE 4 MG PO TBPK: ORAL_TABLET | ORAL | 0 refills | Status: DC

## 2018-01-06 NOTE — Addendum Note (Signed)
Addended by: Mar Daring on: 01/06/2018 12:45 PM   Modules accepted: Orders

## 2018-01-09 MED ORDER — METHYLPREDNISOLONE 4 MG PO TBPK: ORAL_TABLET | ORAL | 0 refills | Status: DC

## 2018-01-09 NOTE — Addendum Note (Signed)
Addended by: Mar Daring on: 01/09/2018 02:02 PM   Modules accepted: Orders

## 2018-01-11 ENCOUNTER — Other Ambulatory Visit: Payer: Self-pay

## 2018-01-11 ENCOUNTER — Emergency Department: Payer: BC Managed Care – PPO

## 2018-01-11 ENCOUNTER — Encounter: Payer: Self-pay | Admitting: Emergency Medicine

## 2018-01-11 ENCOUNTER — Emergency Department
Admission: EM | Admit: 2018-01-11 | Discharge: 2018-01-11 | Disposition: A | Discharge status: Home / Self Care | Payer: BC Managed Care – PPO | Attending: Emergency Medicine | Admitting: Emergency Medicine

## 2018-01-11 DIAGNOSIS — M62830 Muscle spasm of back: Secondary | ICD-10-CM | POA: Diagnosis not present

## 2018-01-11 DIAGNOSIS — Z79899 Other long term (current) drug therapy: Secondary | ICD-10-CM | POA: Insufficient documentation

## 2018-01-11 DIAGNOSIS — Z87891 Personal history of nicotine dependence: Secondary | ICD-10-CM | POA: Insufficient documentation

## 2018-01-11 DIAGNOSIS — M5412 Radiculopathy, cervical region: Secondary | ICD-10-CM | POA: Diagnosis not present

## 2018-01-11 DIAGNOSIS — M25511 Pain in right shoulder: Secondary | ICD-10-CM | POA: Diagnosis not present

## 2018-01-11 DIAGNOSIS — M542 Cervicalgia: Secondary | ICD-10-CM | POA: Diagnosis not present

## 2018-01-11 MED ORDER — HYDROMORPHONE HCL 1 MG/ML IJ SOLN
1.0000 mg | Freq: Once | INTRAMUSCULAR | Status: AC
  Administered 2018-01-11: 1 mg via INTRAMUSCULAR
  Filled 2018-01-11: qty 1

## 2018-01-11 MED ORDER — ORPHENADRINE CITRATE 30 MG/ML IJ SOLN
60.0000 mg | Freq: Two times a day (BID) | INTRAMUSCULAR | Status: DC
  Administered 2018-01-11: 60 mg via INTRAMUSCULAR

## 2018-01-11 MED ORDER — OXYCODONE-ACETAMINOPHEN 7.5-325 MG PO TABS
ORAL_TABLET | ORAL | Status: AC
  Administered 2018-01-11: 1 via ORAL
  Filled 2018-01-11: qty 1

## 2018-01-11 MED ORDER — OXYCODONE-ACETAMINOPHEN 7.5-325 MG PO TABS: 1.0000 | ORAL_TABLET | Freq: Four times a day (QID) | ORAL | 0 refills | Status: DC | PRN

## 2018-01-11 MED ORDER — OXYCODONE-ACETAMINOPHEN 7.5-325 MG PO TABS
1.0000 | ORAL_TABLET | Freq: Once | ORAL | Status: AC
  Administered 2018-01-11: 1 via ORAL

## 2018-01-11 MED ORDER — ORPHENADRINE CITRATE 30 MG/ML IJ SOLN
INTRAMUSCULAR | Status: AC
  Administered 2018-01-11: 60 mg via INTRAMUSCULAR
  Filled 2018-01-11: qty 2

## 2018-01-11 MED ORDER — METHOCARBAMOL 500 MG PO TABS: ORAL_TABLET | ORAL | 0 refills | Status: DC

## 2018-01-11 NOTE — ED Provider Notes (Signed)
[facility] Emergency Department Provider Note   ____________________________________________   None    (approximate)  I have reviewed the triage vital signs and the nursing notes.   HISTORY  Chief Complaint Back Pain    HPI Norma Wall is a 47 y.o. female resents to the ED with complaint of right shoulder muscle spasms.  Patient has finished a 6-day course of Medrol and has also been taking Flexeril 5 mg without any improvement of her right shoulder pain.  She has been seeing a chiropractor and also has seen her PCP for this.  She states that neither of these medicines have given her any relief.  She denies any injury to her shoulder or neck.  Patient states that she does work in front of a [employer].  She states this morning the spasms are worse.  Rates her pain as 7 out of 10.   Past Medical History:  Diagnosis Date  . Abdominal pain, right upper quadrant 2013  . Endometriosis 01/2013  . Gallbladder polyp   . Ovarian cyst 2014   right ovary    Patient Active Problem List   Diagnosis Date Noted  . Pruritus ani 08/24/2016  . History of rectal bleeding 08/24/2016  . Adult BMI 30+ 11/20/2014  . Dermatitis, eczematoid 11/20/2014  . Communicable disease contact 11/20/2014  . External hemorrhoid 11/20/2014  . High potassium 11/20/2014  . Feeling faint 11/20/2014  . Awareness of heartbeats 11/20/2014  . Feeling stressed out 11/20/2014  . Bursitis 11/20/2014  . Migraine 08/28/2014  . Gallbladder polyp 09/13/2012  . Avitaminosis D 05/19/2009  . Bloodgood disease 04/29/2009  . Excess, menstruation 04/24/2008  . Current tobacco use 04/24/2008    Past Surgical History:  Procedure Laterality Date  . ABLATION  2012   cyst removal    Prior to Admission medications   Medication Sig Start Date End Date Taking? Authorizing Provider  Cholecalciferol (VITAMIN D3) 5000 units CAPS Take by mouth.    [provider]  clonazePAM  (KLONOPIN) 0.5 MG tablet TAKE 1 TABLET BY MOUTH TWICE DAILY AS NEEDED FOR ANXIETY OR PALPITATIONS 10/25/17   Mar Daring, PA-C  magnesium 30 MG tablet Take 30 mg by mouth daily.    [provider]  methocarbamol (ROBAXIN) 500 MG tablet 1-2 tablets every 6 hours prn muscle spasms 01/11/18   Johnn Hai, PA-C  Multiple Vitamin (MULTIVITAMIN) tablet Take 1 tablet by mouth daily.    [provider]  oxyCODONE-acetaminophen (PERCOCET) 7.5-325 MG tablet Take 1 tablet by mouth every 6 (six) hours as needed for severe pain. 01/11/18 01/11/19  Johnn Hai, PA-C  sodium chloride (OCEAN) 0.65 % SOLN nasal spray Place 2 sprays into both nostrils every 2 (two) hours while awake. Patient not taking: Reported on 05/04/2017 04/22/17 05/22/17  Olen Cordial, NP  spironolactone (ALDACTONE) 100 MG tablet Take 50 mg by mouth daily.    [provider]  SUMAtriptan (IMITREX) 50 MG tablet TAKE 1 TABLET BY MOUTH IMMEDIATELY, MAY REPEAT AFTER 2 HOURS AS NEEDED 01/03/17   Mar Daring, PA-C    Allergies Other  Family History  Problem Relation Age of Onset  . Lung cancer Father   . Diabetes Mother   . Diabetes Maternal Grandmother   . Colon cancer Neg Hx     Social History Social History   Tobacco Use  . Smoking status: Former Smoker    Types: Cigarettes    Last attempt to quit: 02/01/1997  Years since quitting: 20.9  . Smokeless tobacco: Never Used  Substance Use Topics  . Alcohol use: No  . Drug use: No    Review of Systems Constitutional: No fever/chills Cardiovascular: Denies chest pain. Respiratory: Denies shortness of breath. Gastrointestinal: No abdominal pain.  No nausea, no vomiting.  Musculoskeletal: Positive for right shoulder pain. Skin: Negative for rash. Neurological: Negative for headaches, focal weakness or numbness. ___________________________________________   PHYSICAL EXAM:  VITAL SIGNS: ED Triage Vitals  Enc Vitals  Group     BP 01/11/18 0706 131/84     Pulse Rate 01/11/18 0706 76     Resp 01/11/18 0706 18     Temp 01/11/18 0706 97.8 F (36.6 C)     Temp Source 01/11/18 0706 Oral     SpO2 01/11/18 0706 98 %     Weight 01/11/18 0650 215 lb (97.5 kg)     Height 01/11/18 0650 5\' 7"  (1.702 m)     Head Circumference --      Peak Flow --      Pain Score 01/11/18 0650 7     Pain Loc --      Pain Edu? --      Excl. in [place]? --    Constitutional: Alert and oriented. Well appearing and in no acute distress, but does appear to be uncomfortable. Eyes: Conjunctivae are normal.  Head: Atraumatic. Neck: No stridor.   Cardiovascular: Normal rate, regular rhythm. Grossly normal heart sounds.  Good peripheral circulation. Respiratory: Normal respiratory effort.  No retractions. Lungs CTAB. Musculoskeletal: On examination of the neck and right shoulder there is no gross deformity.  There is marked tenderness with light palpation of the trapezius and rhomboid muscle.  Patient does have some right lateral cervical muscle tenderness as well.  There is no point tenderness on palpation of cervical spine.  No soft tissue edema or discoloration is noted.  Patient guards movement of the right shoulder due to pain.  Trapezius muscle is extremely tight with muscle spasms at present.  She is able to move her forearm and wrist and make a fist with her hand. Neurologic:  Normal speech and language. No gross focal neurologic deficits are appreciated.  Skin:  Skin is warm, dry and intact. No rash noted. Psychiatric: Mood and affect are normal. Speech and behavior are normal.  ____________________________________________   LABS (all labs ordered are listed, but only abnormal results are displayed)  Labs Reviewed - No data to display   RADIOLOGY  Official radiology report(s): Dg Cervical Spine 2-3 Views  Result Date: 01/11/2018 CLINICAL DATA:  Neck pain for 1 week.  No known injury. EXAM: CERVICAL SPINE - 2-3 VIEW  COMPARISON:  None. FINDINGS: There is no evidence of cervical spine fracture or prevertebral soft tissue swelling. Alignment is normal. No other significant bone abnormalities are identified. IMPRESSION: Negative cervical spine radiographs. Electronically Signed   By: Inge Rise M.D.   On: 01/11/2018 09:50    ____________________________________________   PROCEDURES  Procedure(s) performed: None  Procedures  Critical Care performed: No  ____________________________________________   INITIAL IMPRESSION / ASSESSMENT AND PLAN / ED COURSE  As part of my medical decision making, I reviewed the following data within the electronic MEDICAL RECORD NUMBER Notes from prior ED visits and [place] Controlled Substance Database  Patient presents to the ED with complaint of exacerbation of her muscle spasms to her right shoulder.  She has been treated by [facility] with steroids and Flexeril which is not helped.  She is also been seeing a chiropractor without any relief of her pain.  Patient continues to have radiculopathy on the right side.  Exam is positive for marked tenderness of the trapezius muscle.  Cervical spine x-ray is negative for any abnormalities.  While in the ED patient was given Norflex 60 mg IM and Percocet 7.5 mg.  She was able to get some  relief with the Norflex and muscle spasms were decreased.  Patient states that the pain has not resolved.  She was given Dilaudid 1 mg IM and pain was decreasing at the time of discharge.  We discussed follow-up with her PCP and possibly an appointment for an MRI if symptoms persist.  Patient was discharged with prescription for Percocet 7.5 and methocarbamol 500 mg 1 or 2 tablets every 6 hours as needed for muscle spasms.  Is encouraged to use ice or heat to her shoulders as needed for discomfort.  Patient will discontinue taking Flexeril.  She also has understanding that she has a another longer taper of steroids at the pharmacy to pick up  today.   ____________________________________________   FINAL CLINICAL IMPRESSION(S) / ED DIAGNOSES  Final diagnoses:  Acute cervical radiculopathy  Muscle spasm of back     ED Discharge Orders         Ordered    methocarbamol (ROBAXIN) 500 MG tablet     01/11/18 1042    oxyCODONE-acetaminophen (PERCOCET) 7.5-325 MG tablet  Every 6 hours PRN     01/11/18 1042           Note:  This document was prepared using Dragon voice recognition software and may include unintentional dictation errors.    Johnn Hai, PA-C 01/11/18 1516    Earleen Newport, MD 01/12/18 [phone]

## 2018-01-11 NOTE — ED Triage Notes (Addendum)
Patient ambulatory to triage with steady gait, without difficulty or distress noted; pt reports intermittent right upper back "spasms" radiating down right arm; has been seeing chiropractor and receiving "holistic" treatment; recently taking steroids without relief; denies any specific injury

## 2018-01-11 NOTE — Discharge Instructions (Signed)
Follow-up with your primary care provider if any continued problems.  Also Dr. Harlow Mares is the orthopedist on call.  You may also need to see him for further evaluation of your cervical radiculopathy.  Discontinue taking Flexeril which is the muscle relaxant.  Begin taking Percocet 1 every 6 hours as needed for pain.  Robaxin 1 or 2 every 6 hours as needed for muscle spasms.  You may use ice or heat to your neck and shoulder as needed for discomfort.  Also asked the pharmacist at [facility] if you could still get the medication (steroids) without going through insurance and paying cash. Do not drive or operate machinery while taking the Percocet and the Robaxin.

## 2018-01-13 ENCOUNTER — Ambulatory Visit: Payer: BC Managed Care – PPO | Admitting: Family Medicine

## 2018-01-13 MED ORDER — GABAPENTIN 100 MG PO CAPS: 100.0000 mg | ORAL_CAPSULE | Freq: Three times a day (TID) | ORAL | 0 refills | Status: DC

## 2018-01-13 NOTE — Addendum Note (Signed)
Addended by: Mar Daring on: 01/13/2018 10:08 AM   Modules accepted: Orders

## 2018-01-13 NOTE — Addendum Note (Signed)
Addended by: Mar Daring on: 01/13/2018 11:52 AM   Modules accepted: Orders

## 2018-01-16 ENCOUNTER — Other Ambulatory Visit: Payer: Self-pay | Admitting: Physician Assistant

## 2018-01-16 DIAGNOSIS — M5412 Radiculopathy, cervical region: Secondary | ICD-10-CM

## 2018-01-16 MED ORDER — OXYCODONE-ACETAMINOPHEN 7.5-325 MG PO TABS: 1.0000 | ORAL_TABLET | Freq: Four times a day (QID) | ORAL | 0 refills | Status: DC | PRN

## 2018-01-16 MED ORDER — METHOCARBAMOL 500 MG PO TABS: ORAL_TABLET | ORAL | 0 refills | Status: DC

## 2018-01-16 NOTE — Progress Notes (Signed)
Refilled methocarbamol and percocet for patient

## 2018-01-18 ENCOUNTER — Encounter: Payer: Self-pay | Admitting: Physician Assistant

## 2018-01-18 DIAGNOSIS — M5410 Radiculopathy, site unspecified: Secondary | ICD-10-CM | POA: Diagnosis not present

## 2018-01-18 DIAGNOSIS — M5020 Other cervical disc displacement, unspecified cervical region: Secondary | ICD-10-CM

## 2018-01-18 DIAGNOSIS — M5412 Radiculopathy, cervical region: Secondary | ICD-10-CM

## 2018-01-18 DIAGNOSIS — D1809 Hemangioma of other sites: Secondary | ICD-10-CM | POA: Diagnosis not present

## 2018-01-18 DIAGNOSIS — M4712 Other spondylosis with myelopathy, cervical region: Secondary | ICD-10-CM

## 2018-01-18 DIAGNOSIS — M50222 Other cervical disc displacement at C5-C6 level: Secondary | ICD-10-CM | POA: Diagnosis not present

## 2018-02-01 DIAGNOSIS — E785 Hyperlipidemia, unspecified: Secondary | ICD-10-CM

## 2018-02-01 HISTORY — DX: Hyperlipidemia, unspecified: E78.5

## 2018-02-08 ENCOUNTER — Encounter: Payer: Self-pay | Admitting: Physical Therapy

## 2018-02-08 DIAGNOSIS — F4322 Adjustment disorder with anxiety: Secondary | ICD-10-CM | POA: Diagnosis not present

## 2018-02-08 DIAGNOSIS — G8929 Other chronic pain: Secondary | ICD-10-CM

## 2018-02-08 DIAGNOSIS — M6281 Muscle weakness (generalized): Secondary | ICD-10-CM

## 2018-02-08 DIAGNOSIS — M25512 Pain in left shoulder: Secondary | ICD-10-CM

## 2018-02-08 DIAGNOSIS — M62838 Other muscle spasm: Secondary | ICD-10-CM

## 2018-02-08 NOTE — Therapy (Signed)
[facility] [address], [place], [address] Phone: [phone]   Fax:  [phone]  Physical Therapy Discharge Summary Reporting Period: 10/04/2017 - 11/23/2017  Patient Details  Name: Norma Wall MRN: [id number] Date of Birth: 07/14/1970 Referring Provider (PT): Fenton Malling   Encounter Date: 02/08/2018    Past Medical History:  Diagnosis Date  . Abdominal pain, right upper quadrant 2013  . Endometriosis 01/2013  . Gallbladder polyp   . Ovarian cyst 2014   right ovary    Past Surgical History:  Procedure Laterality Date  . ABLATION  2012   cyst removal    There were no vitals filed for this visit.  Subjective Assessment - 02/08/18 1129    Subjective  Patient has not returned for over 30 days and is now being discharged from skilled physical therapy at this time. Her case had been held open as she was somewhat hesitant to discharge at her last session. See previous documentation for her last subjective report.     Pertinent History  Patient is 48 yo female that complains of L shoulder pain that started around December 2018. Reports at work her bag that she carried around got heavier due to increased caseload. States she was busier at work and needed to carry more supplies around. Also reports an increase of stress and tension in shoulders. Experiences most pain at end of day, especially sleeping. Also complains of pain during weight bearing through elbow, functional activities such as reaching, lifting, carrying, driving.     Limitations  Other (comment);Lifting   sleeping (a little), activities that require internal rotation and horizontal adduction.   Diagnostic tests  Xray (patient reports that her physician said it was clear)    Patient Stated Goals  Decrease pain, ability to perform functional/recreational activities    Pain Onset  More than a month ago       OBJECTIVE:  Patient  is not present for objective exam at this time. Please see previous documentation for latest objective examination.      PT Short Term Goals - 11/23/17 1715      PT SHORT TERM GOAL #1   Title  Patient will be compliant with HEP at least 3x a week to demonstrate ability to self manage condition at home.     Baseline  reports performance of HEP every other day    Time  4    Period  Weeks    Status  Achieved    Target Date  11/09/17      PT SHORT TERM GOAL #2   Title  Patient will demonstrate improved functional ability exhibited by improved FOTO score by at least 10 points    Baseline  baseline: 55 ; 68 11/09/2017    Time  4    Period  Weeks    Status  Achieved    Target Date  11/09/17        PT Long Term Goals - 11/23/17 1717      PT LONG TERM GOAL #1   Title  Patient will demonstrate improved L shoulder (and scapular) strength at least least 4+/5 to improve ability to perform functional tasks such as reaching, lifting, carrying.    Time  8    Period  Weeks    Status  Achieved    Target Date  11/29/17      PT LONG TERM GOAL #2   Title  Patient will demonstrate improved ability  to perform functional activities as well as work duties exhibited by improved FOTO score to 70 points or more.     Baseline  baseline: 55 ; 68 (11/09/2017); 70 (11/23/2017)    Time  8    Period  Weeks    Status  Achieved    Target Date  11/29/17      PT LONG TERM GOAL #3   Title  Patient will report pain at worst with work activities as 2/10 or less to promote ease with necessary tasks.    Baseline  Notes: 1/10 with modifications in activity (11/23/2017)    Time  8    Period  Weeks    Status  Achieved    Target Date  11/29/17      PT LONG TERM GOAL #4   Title  Patient will demonstrate improved postural awareness exhibited by ability to maintain proper posture for at least 54mnutes without prompting/external cues via patient report.    Time  8    Period  Weeks    Status  Achieved    Target  Date  11/29/17            Plan - 02/08/18 1134    Clinical Impression Statement  Patient attended 10 physical therapy sessions and during that time made good progress towards goals. It has been over 30 days since her last visit where she expressed desire to discontinue care related to financial concerns but she was uncertain about her decision and her case was left open in case she wished to return. At that time she had met all short and long term goals. She showed improved strength, less pain with range of motion, improved activity tolerance, and improved FOTO score compared to initial eval. She demonstrated good understanding of her HEP and required mild cuing and education for proper form and performance of exercises. At that time she continued to report discomfort and weakness that are affecting her sleep, work, and leisure activities but states it has gotten much better. PT recommended that she would benefit from further PT to address these ongoing impairments and functional limitations but she has expressed she would like to try continuing her improvement independently with a focus on progressive strengthening from HEP and stress relief. Will now discharge from skilled physical therapy due to self discharge.    PT Frequency  2x / week    PT Duration  8 weeks    PT Treatment/Interventions  Electrical Stimulation;Traction;Moist Heat;Cryotherapy;DME Instruction;Therapeutic activities;Therapeutic exercise;Neuromuscular re-education;Dry needling;Patient/family education;Passive range of motion    PT Next Visit Plan  Patient is now discharged from skilled physical therapy due ot self discharge.     PT Home Exercise Plan  Medbridge Access Code: [email or web address]    Consulted and Agree with Plan of Care  Patient        Patient will benefit from skilled therapeutic intervention in order to improve the following deficits and impairments:  Decreased activity tolerance, Decreased endurance, Decreased strength,  Improper body mechanics, Pain, Postural dysfunction, Impaired flexibility, Increased muscle spasms  Visit Diagnosis: Muscle weakness (generalized)  Chronic left shoulder pain  Other muscle spasm     Problem List Patient Active Problem List   Diagnosis Date Noted  . Pruritus ani 08/24/2016  . History of rectal bleeding 08/24/2016  . Adult BMI 30+ 11/20/2014  . Dermatitis, eczematoid 11/20/2014  . Communicable disease contact 11/20/2014  . External hemorrhoid 11/20/2014  . High potassium 11/20/2014  . Feeling faint 11/20/2014  . Awareness of  heartbeats 11/20/2014  . Feeling stressed out 11/20/2014  . Bursitis 11/20/2014  . Migraine 08/28/2014  . Gallbladder polyp 09/13/2012  . Avitaminosis D 05/19/2009  . Bloodgood disease 04/29/2009  . Excess, menstruation 04/24/2008  . Current tobacco use 04/24/2008    Nancy Nordmann, PT, DPT 02/08/2018, 11:37 AM  [facility] [address], [place], [address] Phone: [phone]   Fax:  [phone]  Name: Norma Wall MRN: [id number] Date of Birth: 1970-02-18

## 2018-02-09 DIAGNOSIS — Z6836 Body mass index (BMI) 36.0-36.9, adult: Secondary | ICD-10-CM | POA: Diagnosis not present

## 2018-02-09 DIAGNOSIS — M5412 Radiculopathy, cervical region: Secondary | ICD-10-CM | POA: Diagnosis not present

## 2018-02-10 ENCOUNTER — Other Ambulatory Visit: Payer: Self-pay | Admitting: Physician Assistant

## 2018-02-10 DIAGNOSIS — G43709 Chronic migraine without aura, not intractable, without status migrainosus: Secondary | ICD-10-CM

## 2018-02-12 ENCOUNTER — Encounter: Payer: Self-pay | Admitting: Physician Assistant

## 2018-02-15 DIAGNOSIS — F4322 Adjustment disorder with anxiety: Secondary | ICD-10-CM | POA: Diagnosis not present

## 2018-02-20 DIAGNOSIS — K080 Exfoliation of teeth due to systemic causes: Secondary | ICD-10-CM | POA: Diagnosis not present

## 2018-02-22 DIAGNOSIS — M5020 Other cervical disc displacement, unspecified cervical region: Secondary | ICD-10-CM | POA: Diagnosis not present

## 2018-02-22 DIAGNOSIS — M4722 Other spondylosis with radiculopathy, cervical region: Secondary | ICD-10-CM | POA: Diagnosis not present

## 2018-02-22 DIAGNOSIS — M50123 Cervical disc disorder at C6-C7 level with radiculopathy: Secondary | ICD-10-CM | POA: Diagnosis not present

## 2018-02-22 HISTORY — PX: NECK SURGERY: SHX720

## 2018-02-26 ENCOUNTER — Emergency Department
Admission: EM | Admit: 2018-02-26 | Discharge: 2018-02-26 | Disposition: A | Discharge status: Home / Self Care | Payer: Federal, State, Local not specified - PPO | Attending: Student in an Organized Health Care Education/Training Program | Admitting: Student in an Organized Health Care Education/Training Program

## 2018-02-26 ENCOUNTER — Other Ambulatory Visit: Payer: Self-pay

## 2018-02-26 ENCOUNTER — Emergency Department: Payer: Federal, State, Local not specified - PPO

## 2018-02-26 DIAGNOSIS — Z87891 Personal history of nicotine dependence: Secondary | ICD-10-CM | POA: Diagnosis not present

## 2018-02-26 DIAGNOSIS — Z79899 Other long term (current) drug therapy: Secondary | ICD-10-CM | POA: Insufficient documentation

## 2018-02-26 DIAGNOSIS — R55 Syncope and collapse: Secondary | ICD-10-CM | POA: Insufficient documentation

## 2018-02-26 DIAGNOSIS — M542 Cervicalgia: Secondary | ICD-10-CM | POA: Diagnosis not present

## 2018-02-26 DIAGNOSIS — R110 Nausea: Secondary | ICD-10-CM | POA: Diagnosis not present

## 2018-02-26 DIAGNOSIS — I10 Essential (primary) hypertension: Secondary | ICD-10-CM | POA: Diagnosis not present

## 2018-02-26 DIAGNOSIS — R52 Pain, unspecified: Secondary | ICD-10-CM | POA: Diagnosis not present

## 2018-02-26 LAB — COMPREHENSIVE METABOLIC PANEL
ALBUMIN: 4 g/dL (ref 3.5–5.0)
ALT: 32 U/L (ref 0–44)
AST: 27 U/L (ref 15–41)
Alkaline Phosphatase: 45 U/L (ref 38–126)
Anion gap: 8 (ref 5–15)
BILIRUBIN TOTAL: 0.9 mg/dL (ref 0.3–1.2)
BUN: 11 mg/dL (ref 6–20)
CO2: 23 mmol/L (ref 22–32)
Calcium: 9.3 mg/dL (ref 8.9–10.3)
Chloride: 106 mmol/L (ref 98–111)
Creatinine, Ser: 0.83 mg/dL (ref 0.44–1.00)
GFR calc Af Amer: >60 mL/min (ref >60)
GFR calc non Af Amer: >60 mL/min (ref >60)
GLUCOSE: 133 mg/dL — AB (ref 70–99)
Potassium: 3.7 mmol/L (ref 3.5–5.1)
Sodium: 137 mmol/L (ref 135–145)
Total Protein: 7.5 g/dL (ref 6.5–8.1)

## 2018-02-26 LAB — URINALYSIS, COMPLETE (UACMP) WITH MICROSCOPIC
Bilirubin Urine: NEGATIVE
Glucose, UA: NEGATIVE mg/dL
Ketones, ur: 5 mg/dL — AB
Leukocytes, UA: NEGATIVE
Nitrite: NEGATIVE
Protein, ur: NEGATIVE mg/dL
SPECIFIC GRAVITY, URINE: 1.003 — AB (ref 1.005–1.030)
pH: 8 (ref 5.0–8.0)

## 2018-02-26 LAB — CBC
HCT: 44.2 % (ref 36.0–46.0)
Hemoglobin: 14.4 g/dL (ref 12.0–15.0)
MCH: 29.6 pg (ref 26.0–34.0)
MCHC: 32.6 g/dL (ref 30.0–36.0)
MCV: 90.8 fL (ref 80.0–100.0)
Platelets: 211 10*3/uL (ref 150–400)
RBC: 4.87 MIL/uL (ref 3.87–5.11)
RDW: 12.2 % (ref 11.5–15.5)
WBC: 6.1 10*3/uL (ref 4.0–10.5)
nRBC: 0 % (ref 0.0–0.2)

## 2018-02-26 LAB — LIPASE, BLOOD: Lipase: 27 U/L (ref 11–51)

## 2018-02-26 LAB — TROPONIN I: Troponin I: <0.03 ng/mL (ref <0.03)

## 2018-02-26 MED ORDER — ONDANSETRON HCL 4 MG PO TABS: 4.0000 mg | ORAL_TABLET | Freq: Every day | ORAL | 0 refills | Status: DC | PRN

## 2018-02-26 MED ORDER — PREDNISONE 10 MG PO TABS: 10.0000 mg | ORAL_TABLET | Freq: Every day | ORAL | 0 refills | Status: DC

## 2018-02-26 MED ORDER — PROMETHAZINE HCL 25 MG/ML IJ SOLN: 12.5000 mg | Freq: Four times a day (QID) | INTRAMUSCULAR | Status: DC | PRN

## 2018-02-26 MED ORDER — SODIUM CHLORIDE 0.9 % IV BOLUS
500.0000 mL | Freq: Once | INTRAVENOUS | Status: AC
  Administered 2018-02-26: 500 mL via INTRAVENOUS

## 2018-02-26 MED ORDER — DEXAMETHASONE SODIUM PHOSPHATE 10 MG/ML IJ SOLN
10.0000 mg | Freq: Once | INTRAMUSCULAR | Status: AC
  Administered 2018-02-26: 10 mg via INTRAVENOUS
  Filled 2018-02-26: qty 1

## 2018-02-26 NOTE — ED Provider Notes (Signed)
-----------------------------------------   4:22 PM on 02/26/2018 -----------------------------------------  Patient care assumed from Dr. Quentin Cornwall.  Patient's x-ray shows no signs of prevertebral or soft tissue swelling.  Patient's troponin is normal.  Overall the patient appears well during my evaluation.  I believe the patient is safe for discharge home with outpatient follow-up.  Patient agreeable to plan of care.   Harvest Dark, MD 02/26/18 [phone]

## 2018-02-26 NOTE — ED Notes (Signed)
Pt given crackers and gingerale for PO challenge.

## 2018-02-26 NOTE — ED Notes (Signed)
Pt given ginger ale.

## 2018-02-26 NOTE — ED Provider Notes (Signed)
[facility] Emergency Department Provider Note    First MD Initiated Contact with Patient 02/26/18 1308     (approximate)  I have reviewed the triage vital signs and the nursing notes.   HISTORY  Chief Complaint Nausea    HPI Norma Wall is a 48 y.o. female with the below listed past medical history status post recent surgery presents the ER for nausea lightheadedness generalized weakness.  States that she felt she nearly passed out this morning.  Did not have prolonged LOC.  Denies any new neck pain or headache.  No numbness or tingling.  Denies any chest pain or shortness of breath at this time.  States that she has had decreased appetite since the surgery.  She is not any blood thinners.  Denies any abdominal pain.  No dysuria.    Past Medical History:  Diagnosis Date  . Abdominal pain, right upper quadrant 2013  . Endometriosis 01/2013  . Gallbladder polyp   . Ovarian cyst 2014   right ovary   Family History  Problem Relation Age of Onset  . Lung cancer Father   . Diabetes Mother   . Diabetes Maternal Grandmother   . Colon cancer Neg Hx    Past Surgical History:  Procedure Laterality Date  . ABLATION  2012   cyst removal   Patient Active Problem List   Diagnosis Date Noted  . Pruritus ani 08/24/2016  . History of rectal bleeding 08/24/2016  . Adult BMI 30+ 11/20/2014  . Dermatitis, eczematoid 11/20/2014  . Communicable disease contact 11/20/2014  . External hemorrhoid 11/20/2014  . High potassium 11/20/2014  . Feeling faint 11/20/2014  . Awareness of heartbeats 11/20/2014  . Feeling stressed out 11/20/2014  . Bursitis 11/20/2014  . Migraine 08/28/2014  . Gallbladder polyp 09/13/2012  . Avitaminosis D 05/19/2009  . Bloodgood disease 04/29/2009  . Excess, menstruation 04/24/2008  . Current tobacco use 04/24/2008      Prior to Admission medications   Medication Sig Start Date End Date Taking? Authorizing Provider    Cholecalciferol (VITAMIN D3) 5000 units CAPS Take by mouth.    [provider]  clonazePAM (KLONOPIN) 0.5 MG tablet TAKE 1 TABLET BY MOUTH TWICE DAILY AS NEEDED FOR ANXIETY OR PALPITATIONS 10/25/17   Mar Daring, PA-C  gabapentin (NEURONTIN) 100 MG capsule Take 1 capsule (100 mg total) by mouth 3 (three) times daily. 01/13/18   Mar Daring, PA-C  magnesium 30 MG tablet Take 30 mg by mouth daily.    [provider]  methocarbamol (ROBAXIN) 500 MG tablet 1-2 tablets every 6 hours prn muscle spasms 01/16/18   Mar Daring, PA-C  Multiple Vitamin (MULTIVITAMIN) tablet Take 1 tablet by mouth daily.    [provider]  oxyCODONE-acetaminophen (PERCOCET) 7.5-325 MG tablet Take 1 tablet by mouth every 6 (six) hours as needed for severe pain. 01/16/18 01/16/19  Mar Daring, PA-C  sodium chloride (OCEAN) 0.65 % SOLN nasal spray Place 2 sprays into both nostrils every 2 (two) hours while awake. Patient not taking: Reported on 05/04/2017 04/22/17 05/22/17  Olen Cordial, NP  spironolactone (ALDACTONE) 100 MG tablet Take 50 mg by mouth daily.    [provider]  SUMAtriptan (IMITREX) 50 MG tablet TAKE 1 TABLET BY MOUTH IMMEDIATELY, MAY REPEAT AFTER 2 HOURS AS NEEDED 02/13/18   Mar Daring, PA-C    Allergies Other    Social History Social History   Tobacco Use  . Smoking status:  Former Smoker    Types: Cigarettes    Last attempt to quit: 02/01/1997    Years since quitting: 21.0  . Smokeless tobacco: Never Used  Substance Use Topics  . Alcohol use: Yes    Comment: occassional   . Drug use: No    Review of Systems Patient denies headaches, rhinorrhea, blurry vision, numbness, shortness of breath, chest pain, edema, cough, abdominal pain, nausea, vomiting, diarrhea, dysuria, fevers, rashes or hallucinations unless otherwise stated above in HPI. ____________________________________________   PHYSICAL EXAM:  VITAL  SIGNS: Vitals:   02/26/18 1430 02/26/18 1500  BP: (!) 123/92 123/88  Pulse: 67 72  Resp: 13 13  Temp:    SpO2: 99% 96%    Constitutional: Alert and oriented.  Eyes: Conjunctivae are normal.  Head: Atraumatic. Nose: No congestion/rhinnorhea. Mouth/Throat: Mucous membranes are moist.   Neck: No stridor. Painless ROM.  Cardiovascular: Normal rate, regular rhythm. Grossly normal heart sounds.  Good peripheral circulation. Respiratory: Normal respiratory effort.  No retractions. Lungs CTAB. Gastrointestinal: Soft and nontender. No distention. No abdominal bruits. No CVA tenderness. Genitourinary:  Musculoskeletal: No lower extremity tenderness nor edema.  No joint effusions. Neurologic:  Normal speech and language. No gross focal neurologic deficits are appreciated. No facial droop Skin:  Skin is warm, dry and intact. No rash noted. Psychiatric: Mood and affect are normal. Speech and behavior are normal.  ____________________________________________   LABS (all labs ordered are listed, but only abnormal results are displayed)  Results for orders placed or performed during the hospital encounter of 02/26/18 (from the past 24 hour(s))  Lipase, blood     Status: None   Collection Time: 02/26/18  1:06 PM  Result Value Ref Range   Lipase 27 11 - 51 U/L  Comprehensive metabolic panel     Status: Abnormal   Collection Time: 02/26/18  1:06 PM  Result Value Ref Range   Sodium 137 135 - 145 mmol/L   Potassium 3.7 3.5 - 5.1 mmol/L   Chloride 106 98 - 111 mmol/L   CO2 23 22 - 32 mmol/L   Glucose, Bld 133 (H) 70 - 99 mg/dL   BUN 11 6 - 20 mg/dL   Creatinine, Ser 0.83 0.44 - 1.00 mg/dL   Calcium 9.3 8.9 - 10.3 mg/dL   Total Protein 7.5 6.5 - 8.1 g/dL   Albumin 4.0 3.5 - 5.0 g/dL   AST 27 15 - 41 U/L   ALT 32 0 - 44 U/L   Alkaline Phosphatase 45 38 - 126 U/L   Total Bilirubin 0.9 0.3 - 1.2 mg/dL   GFR calc non Af Amer >60 >60 mL/min   GFR calc Af Amer >60 >60 mL/min   Anion gap 8 5  - 15  CBC     Status: None   Collection Time: 02/26/18  1:06 PM  Result Value Ref Range   WBC 6.1 4.0 - 10.5 K/uL   RBC 4.87 3.87 - 5.11 MIL/uL   Hemoglobin 14.4 12.0 - 15.0 g/dL   HCT 44.2 36.0 - 46.0 %   MCV 90.8 80.0 - 100.0 fL   MCH 29.6 26.0 - 34.0 pg   MCHC 32.6 30.0 - 36.0 g/dL   RDW 12.2 11.5 - 15.5 %   Platelets 211 150 - 400 K/uL   nRBC 0.0 0.0 - 0.2 %  Urinalysis, Complete w Microscopic     Status: Abnormal   Collection Time: 02/26/18  1:06 PM  Result Value Ref Range   Color, Urine COLORLESS (  A) YELLOW   APPearance CLEAR (A) CLEAR   Specific Gravity, Urine 1.003 (L) 1.005 - 1.030   pH 8.0 5.0 - 8.0   Glucose, UA NEGATIVE NEGATIVE mg/dL   Hgb urine dipstick SMALL (A) NEGATIVE   Bilirubin Urine NEGATIVE NEGATIVE   Ketones, ur 5 (A) NEGATIVE mg/dL   Protein, ur NEGATIVE NEGATIVE mg/dL   Nitrite NEGATIVE NEGATIVE   Leukocytes, UA NEGATIVE NEGATIVE   RBC / HPF 0-5 0 - 5 RBC/hpf   WBC, UA 0-5 0 - 5 WBC/hpf   Bacteria, UA RARE (A) NONE SEEN   Squamous Epithelial / LPF 0-5 0 - 5   ____________________________________________  EKG My review and personal interpretation at Time: 13:14   Indication: lightheadedness  Rate: 75  Rhythm: sinus Axis: normal Other: norma intervals, no stemi ____________________________________________  RADIOLOGY  I personally reviewed all radiographic images ordered to evaluate for the above acute complaints and reviewed radiology reports and findings.  These findings were personally discussed with the patient.  Please see medical record for radiology report.  ____________________________________________   PROCEDURES  Procedure(s) performed:  Procedures    Critical Care performed: no ____________________________________________   INITIAL IMPRESSION / ASSESSMENT AND PLAN / ED COURSE  Pertinent labs & imaging results that were available during my care of the patient were reviewed by me and considered in my medical decision making  (see chart for details).   DDX: dehydration, electrolyte abn, hematoma, contusion  Norma Wall is a 48 y.o. who presents to the ED with symptoms as described above she is well-appearing in no acute distress.  EKG shows no evidence of dysrhythmia or acute ischemia.  No focal neuro deficits.  No evidence of airway involvement.  Will provide IV fluids as well as IV antibiotics.  Clinical Course as of Feb 27 1520  Sun Feb 26, 2018  1406 Patient reassessed.  And states that she is feeling some nausea still.  Will give a different additional IV antiemetics.   [PR]  1518 I discussed the case with Dr. Ellene Route of neurosurgery.  Plan will be to order soft tissue neck plain film with normal will give dose of Decadron for likely edema and swelling postsurgical.  If abnormal will order CT imaging and patient may require observation in the hospital.  Patient will be signed out to oncoming physician, anticipate patient will be stable and appropriate for outpatient follow-up.   [PR]    Clinical Course User Index [PR] Merlyn Lot, MD     As part of my medical decision making, I reviewed the following data within the [facility] notes reviewed and incorporated, Labs reviewed, notes from prior ED visits and [place] Controlled Substance Database   ____________________________________________   FINAL CLINICAL IMPRESSION(S) / ED DIAGNOSES  Final diagnoses:  Near syncope      NEW MEDICATIONS STARTED DURING THIS VISIT:  New Prescriptions   No medications on file     Note:  This document was prepared using Dragon voice recognition software and may include unintentional dictation errors.    Merlyn Lot, MD 02/26/18 [phone]

## 2018-02-26 NOTE — ED Notes (Signed)
Pt drank ginger ale. No distress noted.

## 2018-02-26 NOTE — ED Triage Notes (Addendum)
Pt arrives [facility] from home for nausea. Had C6, C7 replacement in [place] 4 days ago. States has been limiting pain medication at home. Normal BM's. A&O, ambulatory. C/o of feeling like something is caught in throat where incision was done. Has bandage over incision. No bleeding or redness noted around incision. Speaking in complete sentences. Arrives 20 L AC. Denies blood thinner use. CBG 100, VSS with EMS.

## 2018-02-26 NOTE — ED Notes (Addendum)
Pt ambulatory to and from toilet with steady gait noted. This RN stayed with pt d/t pt stating she was dizzy this AM and thinks she possible passed out. Denied dizziness while walking to toilet.

## 2018-02-26 NOTE — ED Notes (Signed)
Pt taken to xray, will administer medication when pt returns.

## 2018-02-28 ENCOUNTER — Encounter: Payer: Self-pay | Admitting: Physician Assistant

## 2018-03-22 DIAGNOSIS — F4322 Adjustment disorder with anxiety: Secondary | ICD-10-CM | POA: Diagnosis not present

## 2018-03-29 DIAGNOSIS — L578 Other skin changes due to chronic exposure to nonionizing radiation: Secondary | ICD-10-CM | POA: Diagnosis not present

## 2018-03-29 DIAGNOSIS — Z85828 Personal history of other malignant neoplasm of skin: Secondary | ICD-10-CM | POA: Diagnosis not present

## 2018-03-29 DIAGNOSIS — Z86018 Personal history of other benign neoplasm: Secondary | ICD-10-CM | POA: Diagnosis not present

## 2018-03-30 DIAGNOSIS — M542 Cervicalgia: Secondary | ICD-10-CM | POA: Diagnosis not present

## 2018-04-03 ENCOUNTER — Telehealth: Payer: Self-pay | Admitting: Physician Assistant

## 2018-04-05 ENCOUNTER — Encounter: Payer: Self-pay | Admitting: Physician Assistant

## 2018-04-05 ENCOUNTER — Ambulatory Visit: Payer: Federal, State, Local not specified - PPO | Admitting: Physician Assistant

## 2018-04-05 ENCOUNTER — Other Ambulatory Visit: Payer: Self-pay | Admitting: Physician Assistant

## 2018-04-05 VITALS — BP 106/77 | HR 90 | Temp 97.8°F | Resp 16 | Wt 225.0 lb

## 2018-04-05 DIAGNOSIS — E559 Vitamin D deficiency, unspecified: Secondary | ICD-10-CM

## 2018-04-05 DIAGNOSIS — R748 Abnormal levels of other serum enzymes: Secondary | ICD-10-CM | POA: Diagnosis not present

## 2018-04-05 DIAGNOSIS — Z111 Encounter for screening for respiratory tuberculosis: Secondary | ICD-10-CM

## 2018-04-05 NOTE — Progress Notes (Signed)
Patient: Norma Wall Female    DOB: Jun 17, 1970   48 y.o.   MRN: [id number] Visit Date: 04/05/2018  Today's Provider: Mar Daring, PA-C   Chief Complaint  Patient presents with  . Labs Only   Subjective:    I, Sulibeya S. Dimas, CMA, am acting as a Education administrator for [employer].   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 Patient also wants recheck liver enzymes that were slightly elevated on 10/19/17. Patient reports stopping medications that may have caused enzymes to be elevated.   Also of note her mother passed from a massive CVA since last seen. She reports she is doing well. Also has quit her job at this time due to her acute issues and dealing with her mother. That has helped her stress. 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   Allergies  Allergen Reactions  . Other     Itching to cantaloupe and carrots     Current Outpatient Medications:  .  clonazePAM (KLONOPIN) 0.5 MG tablet, TAKE 1 TABLET BY MOUTH TWICE DAILY AS NEEDED FOR ANXIETY OR PALPITATIONS, Disp: 60 tablet, Rfl: 5 .  SUMAtriptan (IMITREX) 50 MG tablet, TAKE 1 TABLET BY MOUTH IMMEDIATELY, MAY REPEAT AFTER 2 HOURS AS NEEDED, Disp: 10 tablet, Rfl: 5  Review of Systems  Constitutional: Negative.   Respiratory: Negative.   Cardiovascular: Negative.   Gastrointestinal: Negative.   Musculoskeletal: Negative.   Neurological: Negative.   Psychiatric/Behavioral: Negative.     Social History   Tobacco Use  . Smoking status: Former Smoker    Types: Cigarettes    Last attempt to quit: 02/01/1997    Years since quitting: 21.1  . Smokeless tobacco: Never Used  Substance Use Topics  . Alcohol use: Yes    Comment: occassional       Objective:   BP 106/77 (BP Location: Left Arm, Patient Position: Sitting, Cuff Size: Large)   Pulse 90   Temp 97.8 F (36.6  C)   Resp 16   Wt 225 lb (102.1 kg)   BMI 35.24 kg/m  Vitals:   04/05/18 0827  BP: 106/77  Pulse: 90  Resp: 16  Temp: 97.8 F (36.6 C)  Weight: 225 lb (102.1 kg)     Physical Exam Vitals signs reviewed.  Constitutional:      General: She is not in acute distress.    Appearance: Normal appearance. She is well-developed. She is not diaphoretic.  Neck:     Musculoskeletal: Normal range of motion and neck supple.  Cardiovascular:     Rate and Rhythm: Normal rate and regular rhythm.     Heart sounds: Normal heart sounds. No murmur. No friction rub. No gallop.   Pulmonary:     Effort: Pulmonary effort is normal. No respiratory distress.     Breath sounds: Normal breath sounds. No wheezing or rales.  Neurological:     Mental Status: She is alert.         Assessment & Plan    1. Elevated liver enzymes Has made dietary changes. Will check labs as below and f/u pending results. - Comprehensive metabolic panel  2. Tuberculosis screening For employment. Will check labs as below and f/u pending results. - QuantiFERON-TB Gold Plus  3. Vitamin D deficiency Stable but recently stopped OTC supplement. Will check labs  as below and f/u pending results. - Vitamin D (25 hydroxy)     Mar Daring, PA-C  [facility]

## 2018-04-06 ENCOUNTER — Telehealth: Payer: Self-pay

## 2018-04-06 DIAGNOSIS — F4322 Adjustment disorder with anxiety: Secondary | ICD-10-CM | POA: Diagnosis not present

## 2018-04-06 NOTE — Telephone Encounter (Signed)
advised

## 2018-04-06 NOTE — Telephone Encounter (Signed)
-----   Message from Mar Daring, [place] sent at 04/06/2018 10:23 AM EST ----- Liver enzymes, sugar, kidney function, protein levels all back to normal. Vit D is low at 23.4. Would recommend to restart Vit D supplement. TB screen still pending.

## 2018-04-06 NOTE — Telephone Encounter (Signed)
LMTCB ED 

## 2018-04-07 ENCOUNTER — Telehealth: Payer: Self-pay

## 2018-04-07 LAB — COMPREHENSIVE METABOLIC PANEL
ALT: 28 IU/L (ref 0–32)
AST: 24 IU/L (ref 0–40)
Albumin/Globulin Ratio: 1.7 (ref 1.2–2.2)
Albumin: 4.4 g/dL (ref 3.8–4.8)
Alkaline Phosphatase: 57 IU/L (ref 39–117)
BILIRUBIN TOTAL: 0.5 mg/dL (ref 0.0–1.2)
BUN/Creatinine Ratio: 10 (ref 9–23)
BUN: 9 mg/dL (ref 6–24)
CHLORIDE: 101 mmol/L (ref 96–106)
CO2: 22 mmol/L (ref 20–29)
Calcium: 9.7 mg/dL (ref 8.7–10.2)
Creatinine, Ser: 0.9 mg/dL (ref 0.57–1.00)
GFR calc Af Amer: 88 mL/min/{1.73_m2} (ref >59)
GFR calc non Af Amer: 76 mL/min/{1.73_m2} (ref >59)
Globulin, Total: 2.6 g/dL (ref 1.5–4.5)
Glucose: 88 mg/dL (ref 65–99)
Potassium: 4.2 mmol/L (ref 3.5–5.2)
Sodium: 138 mmol/L (ref 134–144)
Total Protein: 7 g/dL (ref 6.0–8.5)

## 2018-04-07 LAB — QUANTIFERON-TB GOLD PLUS
QUANTIFERON-TB GOLD PLUS: NEGATIVE
QuantiFERON Mitogen Value: >10 IU/mL
QuantiFERON Nil Value: 0.05 IU/mL
QuantiFERON TB1 Ag Value: 0.05 IU/mL
QuantiFERON TB2 Ag Value: 0.06 IU/mL

## 2018-04-07 LAB — VITAMIN D 25 HYDROXY (VIT D DEFICIENCY, FRACTURES): Vit D, 25-Hydroxy: 23.4 ng/mL — ABNORMAL LOW (ref 30.0–100.0)

## 2018-04-07 NOTE — Telephone Encounter (Signed)
Viewed by Adria Dill on 04/07/2018 5:40 PM

## 2018-04-07 NOTE — Telephone Encounter (Signed)
-----   Message from Mar Daring, PA-C sent at 04/07/2018  4:39 PM EST ----- TB screen negative.

## 2018-04-18 ENCOUNTER — Other Ambulatory Visit: Payer: Self-pay | Admitting: Registered Nurse

## 2018-04-18 ENCOUNTER — Encounter: Payer: Self-pay | Admitting: Registered Nurse

## 2018-04-21 ENCOUNTER — Ambulatory Visit: Payer: BC Managed Care – PPO | Admitting: Obstetrics and Gynecology

## 2018-04-27 DIAGNOSIS — F4322 Adjustment disorder with anxiety: Secondary | ICD-10-CM | POA: Diagnosis not present

## 2018-04-28 ENCOUNTER — Ambulatory Visit: Payer: BC Managed Care – PPO | Admitting: Obstetrics and Gynecology

## 2018-05-04 DIAGNOSIS — F4322 Adjustment disorder with anxiety: Secondary | ICD-10-CM | POA: Diagnosis not present

## 2018-05-18 DIAGNOSIS — F4322 Adjustment disorder with anxiety: Secondary | ICD-10-CM | POA: Diagnosis not present

## 2018-05-30 ENCOUNTER — Ambulatory Visit: Payer: BC Managed Care – PPO | Admitting: Obstetrics and Gynecology

## 2018-06-08 DIAGNOSIS — F4322 Adjustment disorder with anxiety: Secondary | ICD-10-CM | POA: Diagnosis not present

## 2018-06-22 DIAGNOSIS — F4322 Adjustment disorder with anxiety: Secondary | ICD-10-CM | POA: Diagnosis not present

## 2018-06-29 DIAGNOSIS — F4322 Adjustment disorder with anxiety: Secondary | ICD-10-CM | POA: Diagnosis not present

## 2018-07-05 NOTE — Progress Notes (Signed)
Gynecology Annual Exam  PCP: Mar Daring, PA-C  Chief Complaint:  Chief Complaint  Patient presents with  . Gynecologic Exam    Spotting x 3 months    History of Present Illness: Patient is a 48 y.o. G2P2 presents for annual exam. The patient has no complaints today.   LMP: No LMP recorded. Patient has had an ablation. Having some occasional spotting now 9 years out from [facility] endometrial ablation.   The patient is sexually active. She currently uses vasectomy for contraception. She denies dyspareunia.  The patient does perform self breast exams.  There is no notable family history of breast or ovarian cancer in her family.  The patient wears seatbelts: yes.   The patient has regular exercise: not asked.    The patient denies current symptoms of depression.    Review of Systems: Review of Systems  Constitutional: Negative for chills and fever.  HENT: Negative for congestion.   Respiratory: Negative for cough and shortness of breath.   Cardiovascular: Negative for chest pain and palpitations.  Gastrointestinal: Negative for abdominal pain, constipation, diarrhea, heartburn, nausea and vomiting.  Genitourinary: Negative for dysuria, frequency and urgency.  Skin: Negative for itching and rash.  Neurological: Negative for dizziness and headaches.  Endo/Heme/Allergies: Negative for polydipsia.  Psychiatric/Behavioral: Negative for depression.    Past Medical History:  Past Medical History:  Diagnosis Date  . Abdominal pain, right upper quadrant 2013  . Endometriosis 01/2013  . Gallbladder polyp   . Ovarian cyst 2014   right ovary    Past Surgical History:  Past Surgical History:  Procedure Laterality Date  . ABLATION  2012   cyst removal  . NECK SURGERY  02/22/2018   Artificial Disc replacement     Gynecologic History:  No LMP recorded. Patient has had an ablation. Contraception: vasectomy Last Pap: Results were: 02/12/2016 NIL and HR HPV negative    Last mammogram: 09/12/2017 Results were: BI-RAD I Novasure endometrial ablation 07/01/2009  Obstetric History: G2P2  Family History:  Family History  Problem Relation Age of Onset  . Lung cancer Father   . Diabetes Mother   . Diabetes Maternal Grandmother   . Colon cancer Neg Hx     Social History:  Social History   Socioeconomic History  . Marital status: Married    Spouse name: Not on file  . Number of children: Not on file  . Years of education: Not on file  . Highest education level: Not on file  Occupational History  . Not on file  Social Needs  . Financial resource strain: Not on file  . Food insecurity:    Worry: Not on file    Inability: Not on file  . Transportation needs:    Medical: Not on file    Non-medical: Not on file  Tobacco Use  . Smoking status: Former Smoker    Types: Cigarettes    Last attempt to quit: 02/01/1997    Years since quitting: 21.4  . Smokeless tobacco: Never Used  Substance and Sexual Activity  . Alcohol use: Yes    Comment: occassional   . Drug use: No  . Sexual activity: Yes    Birth control/protection: None  Lifestyle  . Physical activity:    Days per week: 0 days    Minutes per session: 0 min  . Stress: Rather much  Relationships  . Social connections:    Talks on phone: More than three times a week    Gets  together: More than three times a week    Attends religious service: More than 4 times per year    Active member of club or organization: No    Attends meetings of clubs or organizations: Never    Relationship status: Married  . Intimate partner violence:    Fear of current or ex partner: No    Emotionally abused: No    Physically abused: No    Forced sexual activity: No  Other Topics Concern  . Not on file  Social History Narrative  . Not on file    Allergies:  Allergies  Allergen Reactions  . Other     Itching to cantaloupe and carrots    Medications: Prior to Admission medications   Medication Sig  Start Date End Date Taking? Authorizing Provider  clonazePAM (KLONOPIN) 0.5 MG tablet TAKE 1 TABLET BY MOUTH TWICE DAILY AS NEEDED FOR ANXIETY OR PALPITATIONS 10/25/17   Mar Daring, PA-C  fluticasone [facility]) 50 MCG/ACT nasal spray USE 1 SPRAY IN EACH NOSTRIL TWICE DAILY 04/18/18   Betancourt, Aura Fey, NP  SUMAtriptan (IMITREX) 50 MG tablet TAKE 1 TABLET BY MOUTH IMMEDIATELY, MAY REPEAT AFTER 2 HOURS AS NEEDED 02/13/18   Mar Daring, PA-C    Physical Exam Vitals: Blood pressure 104/70, pulse 77, height 5\' 7"  (1.702 m), weight 227 lb (103 kg).   General: NAD HEENT: normocephalic, anicteric Thyroid: no enlargement, no palpable nodules Pulmonary: No increased work of breathing, CTAB Cardiovascular: RRR, distal pulses 2+ Breast: Breast symmetrical, no tenderness, no palpable nodules or masses, no skin or nipple retraction present, no nipple discharge.  No axillary or supraclavicular lymphadenopathy. Abdomen: NABS, soft, non-tender, non-distended.  Umbilicus without lesions.  No hepatomegaly, splenomegaly or masses palpable. No evidence of hernia  Genitourinary:  External: Normal external female genitalia.  Normal urethral meatus, normal Bartholin's and Skene's glands.    Vagina: Normal vaginal mucosa, no evidence of prolapse.    Cervix: Grossly normal in appearance, no bleeding  Uterus: Non-enlarged, mobile, normal contour.  No CMT  Adnexa: ovaries non-enlarged, no adnexal masses  Rectal: deferred  Lymphatic: no evidence of inguinal lymphadenopathy Extremities: no edema, erythema, or tenderness Neurologic: Grossly intact Psychiatric: mood appropriate, affect full  Female chaperone present for pelvic and breast  portions of the physical exam      Assessment: 48 y.o. G2P2 routine annual exam  Plan: Problem List Items Addressed This Visit      Other   Endometriosis determined by laparoscopy    Other Visit Diagnoses    Encounter for gynecological examination  without abnormal finding    -  Primary   Breast screening       Relevant Orders   MM 3D SCREEN BREAST BILATERAL   Abnormal uterine bleeding       Relevant Orders   US Transvaginal Non-OB   Screening for malignant neoplasm of cervix       Relevant Orders   Cytology - PAP      1) Mammogram - recommend yearly screening mammogram.  Mammogram Was ordered today   2) STI screening  was notoffered and therefore not obtained  3) ASCCP guidelines and rational discussed.  Patient opts for every 3 years screening interval - obtained early because of spotting  4) Contraception - the patient is currently using  vasectomy.  She is happy with her current form of contraception and plans to continue  5) Colonoscopy - due at 69  6) Routine healthcare maintenance including cholesterol, diabetes screening  discussed managed by PCP  7) AUB - TVUS to evaluate endometrial lining  8) Return in about 1 week (around 07/13/2018) for TVUS and follow up.   Malachy Mood, MD, [facility], [facility] 07/06/2018, 9:15 AM

## 2018-07-06 ENCOUNTER — Ambulatory Visit (INDEPENDENT_AMBULATORY_CARE_PROVIDER_SITE_OTHER): Payer: Federal, State, Local not specified - PPO | Admitting: Obstetrics and Gynecology

## 2018-07-06 ENCOUNTER — Other Ambulatory Visit (HOSPITAL_COMMUNITY)
Admission: RE | Admit: 2018-07-06 | Discharge: 2018-07-06 | Disposition: A | Discharge status: Home / Self Care | Payer: Federal, State, Local not specified - PPO | Source: Ambulatory Visit | Attending: Obstetrics and Gynecology | Admitting: Obstetrics and Gynecology

## 2018-07-06 ENCOUNTER — Encounter: Payer: Self-pay | Admitting: Obstetrics and Gynecology

## 2018-07-06 ENCOUNTER — Other Ambulatory Visit: Payer: Self-pay

## 2018-07-06 VITALS — BP 104/70 | HR 77 | Ht 67.0 in | Wt 227.0 lb

## 2018-07-06 DIAGNOSIS — Z01419 Encounter for gynecological examination (general) (routine) without abnormal findings: Secondary | ICD-10-CM | POA: Diagnosis not present

## 2018-07-06 DIAGNOSIS — Z1239 Encounter for other screening for malignant neoplasm of breast: Secondary | ICD-10-CM

## 2018-07-06 DIAGNOSIS — N809 Endometriosis, unspecified: Secondary | ICD-10-CM | POA: Insufficient documentation

## 2018-07-06 DIAGNOSIS — Z124 Encounter for screening for malignant neoplasm of cervix: Secondary | ICD-10-CM | POA: Diagnosis not present

## 2018-07-06 DIAGNOSIS — N939 Abnormal uterine and vaginal bleeding, unspecified: Secondary | ICD-10-CM

## 2018-07-06 NOTE — Patient Instructions (Signed)
[facility] [address] [place] [address]  [facility]  [address] [place] [address]  Phone: [phone]  

## 2018-07-07 LAB — CYTOLOGY - PAP
Diagnosis: NEGATIVE
HPV: NOT DETECTED

## 2018-07-13 DIAGNOSIS — F4322 Adjustment disorder with anxiety: Secondary | ICD-10-CM | POA: Diagnosis not present

## 2018-07-14 ENCOUNTER — Telehealth: Payer: Self-pay | Admitting: *Deleted

## 2018-07-14 ENCOUNTER — Other Ambulatory Visit: Payer: Federal, State, Local not specified - PPO

## 2018-07-14 ENCOUNTER — Ambulatory Visit: Payer: Federal, State, Local not specified - PPO | Admitting: Obstetrics and Gynecology

## 2018-07-14 DIAGNOSIS — R42 Dizziness and giddiness: Secondary | ICD-10-CM

## 2018-07-14 MED ORDER — MECLIZINE HCL 25 MG PO TABS: 25.0000 mg | ORAL_TABLET | Freq: Three times a day (TID) | ORAL | 0 refills | Status: DC | PRN

## 2018-07-14 NOTE — Telephone Encounter (Signed)
Patient called stating she has had persistent vertigo sensation for 2-3 days. Patient states she has had vertigo in the past and this feels worse. Patient states she has been having dizziness, nausea, and chills. Patient called after hours nurse at lunch time today and was advised to hydrate and lay flat with her feet up. Patient states this not helping her. Please advise?

## 2018-07-14 NOTE — Telephone Encounter (Signed)
I can send in some meclizine for her but if she gets worse she may need to go to the ER.

## 2018-07-14 NOTE — Telephone Encounter (Signed)
Patient advised as directed below. 

## 2018-07-27 DIAGNOSIS — F4322 Adjustment disorder with anxiety: Secondary | ICD-10-CM | POA: Diagnosis not present

## 2018-08-01 ENCOUNTER — Other Ambulatory Visit: Payer: Self-pay

## 2018-08-01 ENCOUNTER — Ambulatory Visit (INDEPENDENT_AMBULATORY_CARE_PROVIDER_SITE_OTHER): Payer: Federal, State, Local not specified - PPO | Admitting: Obstetrics and Gynecology

## 2018-08-01 ENCOUNTER — Ambulatory Visit (INDEPENDENT_AMBULATORY_CARE_PROVIDER_SITE_OTHER): Payer: Federal, State, Local not specified - PPO

## 2018-08-01 ENCOUNTER — Encounter: Payer: Self-pay | Admitting: Obstetrics and Gynecology

## 2018-08-01 VITALS — BP 120/78 | HR 80 | Ht 67.0 in | Wt 229.0 lb

## 2018-08-01 DIAGNOSIS — N83201 Unspecified ovarian cyst, right side: Secondary | ICD-10-CM | POA: Diagnosis not present

## 2018-08-01 DIAGNOSIS — R102 Pelvic and perineal pain: Secondary | ICD-10-CM | POA: Diagnosis not present

## 2018-08-01 DIAGNOSIS — N809 Endometriosis, unspecified: Secondary | ICD-10-CM | POA: Diagnosis not present

## 2018-08-01 DIAGNOSIS — N939 Abnormal uterine and vaginal bleeding, unspecified: Secondary | ICD-10-CM | POA: Diagnosis not present

## 2018-08-01 NOTE — Progress Notes (Signed)
Gynecology Ultrasound Follow Up  Chief Complaint:  Chief Complaint  Patient presents with  . Follow-up    GYN ultrasound     History of Present Illness: Patient is a 48 y.o. female who presents today for ultrasound evaluation of AUB and pelvic pain .  Ultrasound demonstrates the following findgins Adnexa: 1.3 x 1.2 x 1.1 cm right ovarian cyst complex in appearance consistent with hemorrhagic cyst but favor endometrioma given history Uterus: Non-enlarged with endometrial stripe  Indistinct consistent with prior history of ablation, no focal thickening Additional: no free fluid  Review of Systems: Review of Systems  Constitutional: Negative for chills and fever.  Respiratory: Negative for shortness of breath.   Gastrointestinal: Positive for abdominal pain. Negative for constipation, diarrhea, heartburn, nausea and vomiting.  Genitourinary: Negative for dysuria, frequency and urgency.    Past Medical History:  Past Medical History:  Diagnosis Date  . Abdominal pain, right upper quadrant 2013  . Endometriosis 01/2013  . Gallbladder polyp   . Ovarian cyst 2014   right ovary    Past Surgical History:  Past Surgical History:  Procedure Laterality Date  . ABLATION  2012   cyst removal  . NECK SURGERY  02/22/2018   Artificial Disc replacement     Gynecologic History:  No LMP recorded. Patient has had an ablation.  Family History:  Family History  Problem Relation Age of Onset  . Lung cancer Father   . Diabetes Mother   . Diabetes Maternal Grandmother   . Colon cancer Neg Hx     Social History:  Social History   Socioeconomic History  . Marital status: Married    Spouse name: Not on file  . Number of children: Not on file  . Years of education: Not on file  . Highest education level: Not on file  Occupational History  . Not on file  Social Needs  . Financial resource strain: Not on file  . Food insecurity    Worry: Not on file    Inability: Not on  file  . Transportation needs    Medical: Not on file    Non-medical: Not on file  Tobacco Use  . Smoking status: Former Smoker    Types: Cigarettes    Quit date: 02/01/1997    Years since quitting: 21.5  . Smokeless tobacco: Never Used  Substance and Sexual Activity  . Alcohol use: Yes    Comment: occassional   . Drug use: No  . Sexual activity: Yes    Birth control/protection: None  Lifestyle  . Physical activity    Days per week: 0 days    Minutes per session: 0 min  . Stress: Rather much  Relationships  . Social connections    Talks on phone: More than three times a week    Gets together: More than three times a week    Attends religious service: More than 4 times per year    Active member of club or organization: No    Attends meetings of clubs or organizations: Never    Relationship status: Married  . Intimate partner violence    Fear of current or ex partner: No    Emotionally abused: No    Physically abused: No    Forced sexual activity: No  Other Topics Concern  . Not on file  Social History Narrative  . Not on file    Allergies:  Allergies  Allergen Reactions  . Other     Itching to  cantaloupe and carrots    Medications: Prior to Admission medications   Medication Sig Start Date End Date Taking? Authorizing Provider  clonazePAM (KLONOPIN) 0.5 MG tablet TAKE 1 TABLET BY MOUTH TWICE DAILY AS NEEDED FOR ANXIETY OR PALPITATIONS 10/25/17  Yes Kamica, Florance M, PA-C  fluticasone (FLONASE) 50 MCG/ACT nasal spray USE 1 SPRAY IN EACH NOSTRIL TWICE DAILY 04/18/18  Yes Betancourt, Aura Fey, NP  meclizine (ANTIVERT) 25 MG tablet Take 1 tablet (25 mg total) by mouth 3 (three) times daily as needed for dizziness. 07/14/18  Yes Burnette, Clearnce Sorrel, PA-C  SUMAtriptan (IMITREX) 50 MG tablet TAKE 1 TABLET BY MOUTH IMMEDIATELY, MAY REPEAT AFTER 2 HOURS AS NEEDED 02/13/18  Yes Mar Daring, PA-C    Physical Exam Vitals: Blood pressure 120/78, pulse 80, height 5\' 7"   (1.702 m), weight 229 lb (103.9 kg).  General: NAD HEENT: normocephalic, anicteric Pulmonary: No increased work of breathing Extremities: no edema, erythema, or tenderness Neurologic: Grossly intact, normal gait Psychiatric: mood appropriate, affect full  US Transvaginal Non-ob  Result Date: 08/01/2018 Patient Name: Norma Wall DOB: 14-May-1970 MRN: [id number] ULTRASOUND REPORT Location: [facility] Date of Service: 08/01/2018 Indications:Abnormal Uterine Bleeding Findings: The uterus is retroverted and measures 7.5 x 5.5 x 3.9 cm. Echo texture is heterogenous without evidence of focal masses. The Endometrium measures 0.8 mm. It is ill-defined. History of ablation. Right Ovary measures 3.3 x 1.4 x 1.8 cm. It is not normal in appearance. There is a small complex cyst in the right ovary. No blood flow is seen within it. It measures 1.3 x 1.2 x 1.1 cm. Left Ovary measures 2.6 x 1.3 x 1.2 cm. It is normal in appearance. Survey of the adnexa demonstrates no adnexal masses. There is no free fluid in the cul de sac. Impression: 1. Normal appearing uterus and cervix. 2. There is a small complex cyst in the right ovary. 3. Normal left ovary. Recommendations: 1.Clinical correlation with the patient's History and Physical Exam. Gweneth Dimitri, RT Images reviewed.  Normal GYN study without visualized pathology. Small hemorrhagic cyst although endometrioma on differential given history of endometriosis. Malachy Mood, MD, FACOG [facility], [facility]    Assessment: 48 y.o. G2P2 [place] AUB and pelvic pain  Plan: Problem List Items Addressed This Visit      Other   Endometriosis determined by laparoscopy    Other Visit Diagnoses    Abnormal uterine bleeding (AUB)    -  Primary   Pelvic pain          1) AUB - ultrasound showing normal endometrial stripe with postablation changes, no leiomyomata.  She reports no further spotting in the past two months.  2) Hemorrhagiac  ovarian cyst vs endometrioma - high suspicion for small endometrioma given history of endometriosis.  We discussed  Expectant management, orilissa, depo, norethindrone, diagnostic laparoscopy, oophorectomy, or TLH.  Will consider options.  3) A total of 15 minutes were spent in face-to-face contact with the patient during this encounter with over half of that time devoted to counseling and coordination of care.  4) Return if symptoms worsen or fail to improve.    Malachy Mood, MD, [facility], [place]

## 2018-08-18 DIAGNOSIS — F4322 Adjustment disorder with anxiety: Secondary | ICD-10-CM | POA: Diagnosis not present

## 2018-08-23 DIAGNOSIS — F4322 Adjustment disorder with anxiety: Secondary | ICD-10-CM | POA: Diagnosis not present

## 2018-08-30 ENCOUNTER — Encounter: Payer: Self-pay | Admitting: Surgery

## 2018-08-30 ENCOUNTER — Other Ambulatory Visit: Payer: Self-pay

## 2018-08-30 ENCOUNTER — Ambulatory Visit: Payer: Federal, State, Local not specified - PPO | Admitting: Surgery

## 2018-08-30 ENCOUNTER — Other Ambulatory Visit
Admission: RE | Admit: 2018-08-30 | Discharge: 2018-08-30 | Disposition: A | Discharge status: Home / Self Care | Payer: Federal, State, Local not specified - PPO | Source: Ambulatory Visit | Attending: Surgery | Admitting: Surgery

## 2018-08-30 VITALS — BP 109/78 | HR 73 | Temp 97.9°F | Resp 16 | Ht 67.0 in | Wt 226.8 lb

## 2018-08-30 DIAGNOSIS — R1011 Right upper quadrant pain: Secondary | ICD-10-CM | POA: Diagnosis not present

## 2018-08-30 LAB — COMPREHENSIVE METABOLIC PANEL
ALT: 27 U/L (ref 0–44)
AST: 33 U/L (ref 15–41)
Albumin: 4.2 g/dL (ref 3.5–5.0)
Alkaline Phosphatase: 45 U/L (ref 38–126)
Anion gap: 8 (ref 5–15)
BUN: 11 mg/dL (ref 6–20)
CO2: 27 mmol/L (ref 22–32)
Calcium: 9.2 mg/dL (ref 8.9–10.3)
Chloride: 104 mmol/L (ref 98–111)
Creatinine, Ser: 0.81 mg/dL (ref 0.44–1.00)
GFR calc Af Amer: >60 mL/min (ref >60)
GFR calc non Af Amer: >60 mL/min (ref >60)
Glucose, Bld: 112 mg/dL — ABNORMAL HIGH (ref 70–99)
Potassium: 3.8 mmol/L (ref 3.5–5.1)
Sodium: 139 mmol/L (ref 135–145)
Total Bilirubin: 0.7 mg/dL (ref 0.3–1.2)
Total Protein: 7.3 g/dL (ref 6.5–8.1)

## 2018-08-30 NOTE — Patient Instructions (Addendum)
Please call our office if you have any questions or concerns.   Ultrasound scheduled 09/08/2018 9:15 [facility]. Nothing to eat or drink after midnight.   Please go to the [facility] today for lab work.  Please see your follow up appointment listed below.

## 2018-08-31 ENCOUNTER — Encounter: Payer: Self-pay | Admitting: Surgery

## 2018-08-31 NOTE — Progress Notes (Addendum)
Patient ID: Norma Wall, female   DOB: [birth year], 48 y.o.   MRN: [id number]  HPI Norma Wall is a 48 y.o. female in consultation at the request of Dr. Georgianne Wall for abdominal pain.  She does have a history of endometriosis and ovarian cyst that has been followed by GYN. Reports that she has had intermittent abdominal pain for several months now located mainly in the right side of her abdomen.  The pain is dull, intermittent and moderate to mild in intensity.  There is no specific alleviating or aggravating factors.  Sometimes she reports that she has diarrhea especially after eating salads.  No fevers no chills no evidence of biliary obstruction.  Did have an ultrasound 4 years ago that I have personally reviewed showing evidence of a 3.6 mm gallbladder polyp.  There was no evidence of biliary dilation.  Recent CMP and CBC was completely normal.  HPI  Past Medical History:  Diagnosis Date  . Abdominal pain, right upper quadrant 2013  . Endometriosis 01/2013  . Gallbladder polyp   . Ovarian cyst 2014   right ovary    Past Surgical History:  Procedure Laterality Date  . ABLATION  2012   cyst removal  . NECK SURGERY  02/22/2018   Artificial Disc replacement     Family History  Problem Relation Age of Onset  . Lung cancer Father   . Diabetes Mother   . Stroke Mother   . Diabetes Maternal Grandmother   . Colon cancer Neg Hx     Social History Social History   Tobacco Use  . Smoking status: Former Smoker    Types: Cigarettes    Quit date: 02/01/1997    Years since quitting: 21.5  . Smokeless tobacco: Never Used  Substance Use Topics  . Alcohol use: Yes    Comment: occassional   . Drug use: No    Allergies  Allergen Reactions  . Other     Itching to cantaloupe and carrots    Current Outpatient Medications  Medication Sig Dispense Refill  . clonazePAM (KLONOPIN) 0.5 MG tablet TAKE 1 TABLET BY MOUTH TWICE DAILY AS NEEDED FOR ANXIETY OR PALPITATIONS 60  tablet 5  . fluticasone (FLONASE) 50 MCG/ACT nasal spray USE 1 SPRAY IN EACH NOSTRIL TWICE DAILY 16 g 1  . meclizine (ANTIVERT) 25 MG tablet Take 1 tablet (25 mg total) by mouth 3 (three) times daily as needed for dizziness. 30 tablet 0  . SUMAtriptan (IMITREX) 50 MG tablet TAKE 1 TABLET BY MOUTH IMMEDIATELY, MAY REPEAT AFTER 2 HOURS AS NEEDED 10 tablet 5   No current facility-administered medications for this visit.      Review of Systems Full ROS  was asked and was negative except for the information on the HPI  Physical Exam Blood pressure 109/78, pulse 73, temperature 97.9 F (36.6 C), temperature source Temporal, resp. rate 16, height 5\' 7"  (1.702 m), weight 226 lb 12.8 oz (102.9 kg), SpO2 97 %. CONSTITUTIONAL: NAD EYES: Pupils are equal, round, and reactive to light, Sclera are non-icteric. EARS, NOSE, MOUTH AND THROAT: The oropharynx is clear. The oral mucosa is pink and moist. Hearing is intact to voice. LYMPH NODES:  Lymph nodes in the neck are normal. RESPIRATORY:  Lungs are clear. There is normal respiratory effort, with equal breath sounds bilaterally, and without pathologic use of accessory muscles. CARDIOVASCULAR: Heart is regular without murmurs, gallops, or rubs. GI: The abdomen is soft,  JNo tenderness, no Murphy and no peritonitis. [name]  There are no palpable masses. There is no hepatosplenomegaly. There are normal bowel sounds in all quadrants. GU: Rectal deferred.   MUSCULOSKELETAL: Normal muscle strength and tone. No cyanosis or edema.   SKIN: Turgor is good and there are no pathologic skin lesions or ulcers. NEUROLOGIC: Motor and sensation is grossly normal. Cranial nerves are grossly intact. PSYCH:  Oriented to person, place and time. Affect is normal.  Data Reviewed  I have personally reviewed the patient's imaging, laboratory findings and medical records.    Assessment/Plan 48 year old female with non specific abdominal pain gallbladder polyp.  I would like to  interrogate the gallbladder polyp again with a right upper quadrant ultrasound.  Pain is not classic for biliary source.  It might be more related to endometriosis.  I have had an extensive discussion with patient regarding my thought process.  I really would like to wait until GYN that is a diagnostic laparoscopy and evaluate that ovarian cyst.  I would probably advised against doing a gallbladder and GYN surgery during the same operative setting if possible. D/W her in detail and she understands . We will see her after w/u is completed. We will also recheck cbc and cmp. Copy of this report was sent to the referring provider  Norma Hamman, MD FACS General Surgeon 08/31/2018, 2:10 PM

## 2018-09-01 ENCOUNTER — Telehealth: Payer: Self-pay | Admitting: *Deleted

## 2018-09-01 NOTE — Telephone Encounter (Signed)
Need to advised patient Labs was normal .

## 2018-09-01 NOTE — Telephone Encounter (Signed)
Patient notified of lab results

## 2018-09-05 ENCOUNTER — Telehealth: Payer: Federal, State, Local not specified - PPO | Admitting: Family

## 2018-09-05 DIAGNOSIS — R112 Nausea with vomiting, unspecified: Secondary | ICD-10-CM | POA: Diagnosis not present

## 2018-09-05 MED ORDER — PROMETHAZINE HCL 25 MG PO TABS: 25.0000 mg | ORAL_TABLET | Freq: Three times a day (TID) | ORAL | 0 refills | Status: DC | PRN

## 2018-09-05 NOTE — Progress Notes (Signed)
We are sorry that you are not feeling well. Here is how we plan to help!  Based on what you have shared with me it looks like you have a Virus that is irritating your GI tract.  Vomiting is the forceful emptying of a portion of the stomach's content through the mouth.  Although nausea and vomiting can make you feel miserable, it's important to remember that these are not diseases, but rather symptoms of an underlying illness.  When we treat short term symptoms, we always caution that any symptoms that persist should be fully evaluated in a medical office.  I have prescribed a medication that will help alleviate your symptoms and allow you to stay hydrated:  Promethazine 25 mg take 1 tablet twice daily. Continue your headache medicine   HOME CARE:  Drink clear liquids.  This is very important! Dehydration (the lack of fluid) can lead to a serious complication.  Start off with 1 tablespoon every 5 minutes for 8 hours.  You may begin eating bland foods after 8 hours without vomiting.  Start with saltine crackers, white bread, rice, mashed potatoes, applesauce.  After 48 hours on a bland diet, you may resume a normal diet.  Try to go to sleep.  Sleep often empties the stomach and relieves the need to vomit.  GET HELP RIGHT AWAY IF:   Your symptoms do not improve or worsen within 2 days after treatment.  You have a fever for over 3 days.  You cannot keep down fluids after trying the medication.  MAKE SURE YOU:   Understand these instructions.  Will watch your condition.  Will get help right away if you are not doing well or get worse.   Thank you for choosing an e-visit. Your e-visit answers were reviewed by a board certified advanced clinical practitioner to complete your personal care plan. Depending upon the condition, your plan could have included both over the counter or prescription medications. Please review your pharmacy choice. Be sure that the pharmacy you have chosen is  open so that you can pick up your prescription now.  If there is a problem you may message your provider in [place] to have the prescription routed to another pharmacy. Your safety is important to [place]. If you have drug allergies check your prescription carefully.  For the next 24 hours, you can use MyChart to ask questions about today's visit, request a non-urgent call back, or ask for a work or school excuse from your e-visit provider. You will get an e-mail in the next two days asking about your experience. I hope that your e-visit has been valuable and will speed your recovery.   Greater than 5 minutes, yet less than 10 minutes of time have been spent researching, coordinating, and implementing care for this patient today.  Thank you for the details you included in the comment boxes. Those details are very helpful in determining the best course of treatment for you and help [place] to provide the best care.

## 2018-09-08 ENCOUNTER — Telehealth: Payer: Self-pay

## 2018-09-08 ENCOUNTER — Encounter: Payer: Self-pay | Admitting: Physician Assistant

## 2018-09-08 ENCOUNTER — Ambulatory Visit
Admission: RE | Admit: 2018-09-08 | Discharge: 2018-09-08 | Disposition: A | Discharge status: Home / Self Care | Payer: Federal, State, Local not specified - PPO | Source: Ambulatory Visit | Attending: Surgery | Admitting: Surgery

## 2018-09-08 ENCOUNTER — Telehealth (INDEPENDENT_AMBULATORY_CARE_PROVIDER_SITE_OTHER): Payer: Federal, State, Local not specified - PPO | Admitting: Physician Assistant

## 2018-09-08 ENCOUNTER — Other Ambulatory Visit: Payer: Self-pay

## 2018-09-08 VITALS — Wt 220.0 lb

## 2018-09-08 DIAGNOSIS — R1011 Right upper quadrant pain: Secondary | ICD-10-CM | POA: Diagnosis not present

## 2018-09-08 DIAGNOSIS — K8020 Calculus of gallbladder without cholecystitis without obstruction: Secondary | ICD-10-CM | POA: Diagnosis not present

## 2018-09-08 DIAGNOSIS — H01001 Unspecified blepharitis right upper eyelid: Secondary | ICD-10-CM

## 2018-09-08 MED ORDER — ERYTHROMYCIN 5 MG/GM OP OINT: 1.0000 "application " | TOPICAL_OINTMENT | Freq: Every day | OPHTHALMIC | 0 refills | Status: DC

## 2018-09-08 NOTE — Patient Instructions (Signed)
Blepharitis Blepharitis is inflammation of the eyelids. Blepharitis may happen with:  Reddish, scaly skin around the scalp and eyebrows.  Burning or itching of the eyelids.  Eye discharge at night that causes the eyelashes to stick together in the morning.  Eyelashes that fall out.  Sensitivity to light. Follow these instructions at home: Pay attention to any changes in how your eyes look or feel. Tell your health care provider about any changes. Follow these instructions to help with your condition. Keeping Clean   Wash your hands often.  Wash your eyelids with warm water or with warm water that is mixed with a small amount of baby shampoo. Do this two times per day or as often as needed.  Wash your face and eyebrows at least once a day.  Use a clean towel each time you dry your eyelids. Do not use this towel to clean or dry other areas of your body. Do not share your towel with anyone. General instructions  Avoid wearing makeup until you get better. Do not share makeup with anyone.  Avoid rubbing your eyes.  Apply warm compresses to your eyes 2 times per day for 10 minutes at a time, or as told by your health care provider.  If you were prescribed an antibiotic ointment or steroid drops, apply or use the medicine as told by your health care provider. Do not stop using the medicine even if you feel better.  Keep all follow-up visits as told by your health care provider. This is important. Contact a health care provider if:  Your eyelids feel hot.  You have blisters or a rash on your eyelids.  The condition does not go away in 2-4 days.  The inflammation gets worse. Get help right away if:  You have pain or redness that gets worse or spreads to other parts of your face.  Your vision changes.  You have pain when looking at lights or moving objects.  You have a fever. Summary  Blepharitis is inflammation of the eyelids.  Pay attention to any changes in how your  eyes look or feel. Tell your health care provider about any changes.  Follow home care instructions as told by your health care provider. Wash your hands often. Avoid wearing makeup. Do not rub your eyes.  To treat this condition, use warm compresses and prescription ointments or eye drops.  Let your health care provider know if you have vision changes, blisters or rash on eyelids, pain that spreads to your face, or warmth on your eyelids. This information is not intended to replace advice given to you by your health care provider. Make sure you discuss any questions you have with your health care provider. Document Released: 01/16/2000 Document Revised: 07/11/2017 Document Reviewed: 07/11/2017 Elsevier Patient Education  2020 Elsevier Inc.  

## 2018-09-08 NOTE — Progress Notes (Signed)
Patient: Norma Wall Female    DOB: [birth year]   48 y.o.   MRN: [id number] Visit Date: 09/08/2018  Today's Provider: Mar Daring, PA-C   No chief complaint on file.  Subjective:    I,Joseline E. Rosas,RMA am acting as a Education administrator for [employer], PA-C.   Virtual Visit via Video Note  I connected with Noelene Gang on 09/08/18 at  2:20 PM EDT by a video enabled telemedicine application and verified that I am speaking with the correct person using two identifiers.  Location: Patient: stopped safely in car Provider: [facility]   I discussed the limitations of evaluation and management by telemedicine and the availability of in person appointments. The patient expressed understanding and agreed to proceed.   HPI  Patient c/o swelling/puffiness on her right eye. She reports that she notice it yesterday morning that her eye lid was puffy and today the puffiness is under her eye and on top of her eye. Reports that she put some warm compresses. No redness inside the eye or discharge.No fever  Allergies  Allergen Reactions  . Other     Itching to cantaloupe and carrots     Current Outpatient Medications:  .  clonazePAM (KLONOPIN) 0.5 MG tablet, TAKE 1 TABLET BY MOUTH TWICE DAILY AS NEEDED FOR ANXIETY OR PALPITATIONS, Disp: 60 tablet, Rfl: 5 .  fluticasone (FLONASE) 50 MCG/ACT nasal spray, USE 1 SPRAY IN EACH NOSTRIL TWICE DAILY, Disp: 16 g, Rfl: 1 .  meclizine (ANTIVERT) 25 MG tablet, Take 1 tablet (25 mg total) by mouth 3 (three) times daily as needed for dizziness., Disp: 30 tablet, Rfl: 0 .  SUMAtriptan (IMITREX) 50 MG tablet, TAKE 1 TABLET BY MOUTH IMMEDIATELY, MAY REPEAT AFTER 2 HOURS AS NEEDED, Disp: 10 tablet, Rfl: 5 .  promethazine (PHENERGAN) 25 MG tablet, Take 1 tablet (25 mg total) by mouth every 8 (eight) hours as needed for nausea or vomiting. (Patient not taking: Reported on 09/08/2018), Disp: 20 tablet, Rfl: 0  Review of Systems   Constitutional: Negative.   HENT: Negative.   Eyes: Positive for pain, discharge and visual disturbance.  Respiratory: Negative.   Cardiovascular: Negative.   Gastrointestinal: Negative.   Neurological: Negative.     Social History   Tobacco Use  . Smoking status: Former Smoker    Types: Cigarettes    Quit date: 02/01/1997    Years since quitting: 21.6  . Smokeless tobacco: Never Used  Substance Use Topics  . Alcohol use: Yes    Comment: occassional       Objective:   There were no vitals taken for this visit. There were no vitals filed for this visit.   Physical Exam Vitals signs reviewed.  Constitutional:      Appearance: Normal appearance. She is well-developed.  HENT:     Head: Normocephalic and atraumatic.  Eyes:     General:        Right eye: No foreign body, discharge or hordeolum.        Left eye: No foreign body, discharge or hordeolum.     Extraocular Movements: Extraocular movements intact.     Conjunctiva/sclera: Conjunctivae normal.   Neck:     Musculoskeletal: Normal range of motion and neck supple.  Pulmonary:     Effort: Pulmonary effort is normal. No respiratory distress.  Neurological:     Mental Status: She is alert.  Psychiatric:        Behavior: Behavior normal.  Thought Content: Thought content normal.        Judgment: Judgment normal.      No results found for any visits on 09/08/18.     Assessment & Plan     1. Blepharitis of right upper eyelid, unspecified type Advised to use warm compresses and erythromycin ointment as below. Call if worsening or not responding to treatment and may require ophthalmologic evaluation.  - erythromycin ophthalmic ointment; Place 1 application into the right eye at bedtime.  Dispense: 3.5 g; Refill: 0   I discussed the assessment and treatment plan with the patient. The patient was provided an opportunity to ask questions and all were answered. The patient agreed with the plan and demonstrated  an understanding of the instructions.   The patient was advised to call back or seek an in-person evaluation if the symptoms worsen or if the condition fails to improve as anticipated.  I provided 15 minutes of non-face-to-face time during this encounter.    Mar Daring, PA-C  [facility]

## 2018-09-08 NOTE — Telephone Encounter (Signed)
Right eye  swollen

## 2018-09-11 ENCOUNTER — Telehealth: Payer: Self-pay | Admitting: Obstetrics and Gynecology

## 2018-09-11 NOTE — Telephone Encounter (Signed)
Patient is aware of H&P at [facility] on 10/13/18 @ 9:10am w/ Dr. Georgianne Fick, Pre-admit testing and Covid testing to be scheduled, and OR on 10/19/18. Patient is aware she will be asked to quarantine after Covid testing. Patient is aware she may receive calls from the [facility] and Pre-service center. Patient would like to know expected recovery time.

## 2018-09-13 ENCOUNTER — Encounter: Payer: Self-pay | Admitting: Physician Assistant

## 2018-09-13 ENCOUNTER — Ambulatory Visit: Payer: Federal, State, Local not specified - PPO | Admitting: Surgery

## 2018-09-13 ENCOUNTER — Other Ambulatory Visit: Payer: Self-pay

## 2018-09-13 ENCOUNTER — Encounter: Payer: Self-pay | Admitting: Surgery

## 2018-09-13 ENCOUNTER — Encounter: Payer: Self-pay | Admitting: *Deleted

## 2018-09-13 VITALS — BP 117/82 | HR 93 | Temp 97.5°F | Ht 67.0 in | Wt 225.0 lb

## 2018-09-13 DIAGNOSIS — R42 Dizziness and giddiness: Secondary | ICD-10-CM

## 2018-09-13 DIAGNOSIS — K8020 Calculus of gallbladder without cholecystitis without obstruction: Secondary | ICD-10-CM | POA: Diagnosis not present

## 2018-09-13 NOTE — Patient Instructions (Signed)

## 2018-09-13 NOTE — Progress Notes (Signed)
Patient to be scheduled for gallbladder surgery with Dr. Dahlia Byes at [facility] for 10-19-18. This will be a combined case with Dr. Georgianne Fick at [facility] as the patient is already scheduled for surgery with him that day.   The patient has already been scheduled for a phone interview and COVID testing through [facility]. She will follow all instructions given by their office.   An in basket message was sent to [facility] surgery scheduler, Izora Gala to inform of the above plans.

## 2018-09-14 ENCOUNTER — Encounter: Payer: Self-pay | Admitting: Surgery

## 2018-09-14 NOTE — Progress Notes (Signed)
Outpatient Surgical Follow Up  09/14/2018  Norma Wall is an 48 y.o. female.   Chief Complaint  Patient presents with  . Abdominal Pain    HPI: Norma Wall is following up after work-up of right upper quadrant pain.  She reports having intermittent pain that is dull and moderate to mild intensity.  I did order an ultrasound and I have personally reviewed showing evidence of gallstones measuring up to 1.5 cm.  I did compared to an ultrasound where she had polyps and now there is clear evidence that she has stones.  There is no evidence of biliary dilation.  There is no evidence of acute cholecystitis.  She is been seen by Dr. Georgianne Fick and is scheduled for a right ovarian cystectomy in September.  He is interested in a combined GYN and general surgery procedure.  He is able to perform more than 4 METS of activity without any shortness of breath or chest pain  Past Medical History:  Diagnosis Date  . Abdominal pain, right upper quadrant 2013  . Endometriosis 01/2013  . Gallbladder polyp   . Ovarian cyst 2014   right ovary    Past Surgical History:  Procedure Laterality Date  . ABLATION  2012   cyst removal  . NECK SURGERY  02/22/2018   Artificial Disc replacement     Family History  Problem Relation Age of Onset  . Lung cancer Father   . Diabetes Mother   . Stroke Mother   . Diabetes Maternal Grandmother   . Colon cancer Neg Hx     Social History:  reports that she quit smoking about 21 years ago. Her smoking use included cigarettes. She has never used smokeless tobacco. She reports current alcohol use. She reports that she does not use drugs.  Allergies:  Allergies  Allergen Reactions  . Other     Itching to cantaloupe and carrots    Medications reviewed.    ROS Full ROS performed and is otherwise negative other than what is stated in HPI   BP 117/82   Pulse 93   Temp (!) 97.5 F (36.4 C) (Skin)   Ht 5\' 7"  (1.702 m)   Wt 225 lb (102.1 kg)   SpO2 98%    BMI 35.24 kg/m   Physical Exam Vitals signs and nursing note reviewed. Exam conducted with a chaperone present.  Constitutional:      General: She is not in acute distress.    Appearance: Normal appearance.  Eyes:     General: No scleral icterus.       Right eye: No discharge.        Left eye: No discharge.  Neck:     Musculoskeletal: Normal range of motion and neck supple. No muscular tenderness.  Cardiovascular:     Rate and Rhythm: Normal rate.     Heart sounds: Normal heart sounds. No murmur.  Pulmonary:     Effort: Pulmonary effort is normal. No respiratory distress.     Breath sounds: No stridor.  Abdominal:     General: Abdomen is flat. There is no distension.     Palpations: There is no mass.     Tenderness: There is no abdominal tenderness. There is no guarding or rebound.     Hernia: No hernia is present.  Lymphadenopathy:     Cervical: No cervical adenopathy.  Skin:    General: Skin is warm and dry.     Coloration: Skin is not cyanotic or jaundiced.  Neurological:  General: No focal deficit present.     Mental Status: She is alert and oriented to person, place, and time.  Psychiatric:        Mood and Affect: Mood normal.        Behavior: Behavior normal.        Thought Content: Thought content normal.        Judgment: Judgment normal.         Assessment/Plan: 48 year old female with abdominal pain consistent with biliary colic and symptomatic cholelithiasis.  She does have also history of endometriosis and a right ovarian cyst.  GYN is planning to perform a cystectomy and she is very interested in having a dual procedure.  I had an extensive discussion with her regarding pros and cons of a combined procedure and prolonged anesthetic.  She is extremely interested in having both done since there is a significant recovery from each procedure and she also like to take advantage of using by access ports for both procedures.  I have also discussed the case in  detail with Dr. Georgianne Fick and he is okay with performing both procedures the same time. I discussed the procedure in detail.  The patient was given Neurosurgeon.  We discussed the risks and benefits of a laparoscopic cholecystectomy and possible cholangiogram including, but not limited to bleeding, infection, injury to surrounding structures such as the intestine or liver, bile leak, retained gallstones, need to convert to an open procedure, prolonged diarrhea, blood clots such as  DVT, common bile duct injury, anesthesia risks, and possible need for additional procedures.  The likelihood of improvement in symptoms and return to the patient's normal status is good. We discussed the typical post-operative recovery course. We will coordinate with GYN and Dr. stable for a combined laparoscopic cholecystectomy as well as a ovarian cystectomy  Greater than 50% of the 40 minutes  visit was spent in counseling/coordination of care   Caroleen Hamman, MD [place] [facility]

## 2018-09-15 ENCOUNTER — Telehealth: Payer: Federal, State, Local not specified - PPO | Admitting: Physician Assistant

## 2018-09-15 NOTE — Telephone Encounter (Signed)
Norma Wall,  Just sending as FYI patient requesting Dr. Kathyrn Sheriff.

## 2018-09-26 DIAGNOSIS — H8110 Benign paroxysmal vertigo, unspecified ear: Secondary | ICD-10-CM | POA: Diagnosis not present

## 2018-09-26 DIAGNOSIS — K1120 Sialoadenitis, unspecified: Secondary | ICD-10-CM | POA: Diagnosis not present

## 2018-09-26 DIAGNOSIS — H8109 Meniere's disease, unspecified ear: Secondary | ICD-10-CM | POA: Diagnosis not present

## 2018-09-26 DIAGNOSIS — H93293 Other abnormal auditory perceptions, bilateral: Secondary | ICD-10-CM | POA: Diagnosis not present

## 2018-09-26 DIAGNOSIS — H814 Vertigo of central origin: Secondary | ICD-10-CM | POA: Diagnosis not present

## 2018-09-27 ENCOUNTER — Other Ambulatory Visit: Payer: Self-pay | Admitting: Surgery

## 2018-09-27 ENCOUNTER — Other Ambulatory Visit: Payer: Self-pay | Admitting: Otolaryngology

## 2018-09-27 ENCOUNTER — Other Ambulatory Visit: Payer: Self-pay

## 2018-09-27 ENCOUNTER — Ambulatory Visit
Admission: RE | Admit: 2018-09-27 | Discharge: 2018-09-27 | Disposition: A | Discharge status: Home / Self Care | Payer: Federal, State, Local not specified - PPO | Source: Ambulatory Visit | Attending: Obstetrics and Gynecology | Admitting: Obstetrics and Gynecology

## 2018-09-27 DIAGNOSIS — Z1231 Encounter for screening mammogram for malignant neoplasm of breast: Secondary | ICD-10-CM | POA: Diagnosis not present

## 2018-09-27 DIAGNOSIS — K1120 Sialoadenitis, unspecified: Secondary | ICD-10-CM

## 2018-09-27 DIAGNOSIS — Z1239 Encounter for other screening for malignant neoplasm of breast: Secondary | ICD-10-CM

## 2018-10-02 ENCOUNTER — Ambulatory Visit
Admission: RE | Admit: 2018-10-02 | Discharge: 2018-10-02 | Disposition: A | Discharge status: Home / Self Care | Payer: Federal, State, Local not specified - PPO | Source: Ambulatory Visit | Attending: Otolaryngology | Admitting: Otolaryngology

## 2018-10-02 ENCOUNTER — Other Ambulatory Visit: Payer: Self-pay

## 2018-10-02 DIAGNOSIS — R221 Localized swelling, mass and lump, neck: Secondary | ICD-10-CM | POA: Diagnosis not present

## 2018-10-02 DIAGNOSIS — K1120 Sialoadenitis, unspecified: Secondary | ICD-10-CM | POA: Insufficient documentation

## 2018-10-02 MED ORDER — IOHEXOL 300 MG/ML  SOLN
75.0000 mL | Freq: Once | INTRAMUSCULAR | Status: AC | PRN
  Administered 2018-10-02: 75 mL via INTRAVENOUS

## 2018-10-06 ENCOUNTER — Telehealth: Payer: Self-pay | Admitting: Obstetrics and Gynecology

## 2018-10-06 DIAGNOSIS — R42 Dizziness and giddiness: Secondary | ICD-10-CM | POA: Diagnosis not present

## 2018-10-06 NOTE — Telephone Encounter (Signed)
Patient needed to reschedule H&P due to her work schedule. Consents day of surgery approved by Dr. Georgianne Fick. Patient is aware.

## 2018-10-13 ENCOUNTER — Encounter: Payer: Federal, State, Local not specified - PPO | Admitting: Obstetrics and Gynecology

## 2018-10-13 ENCOUNTER — Other Ambulatory Visit: Payer: Self-pay

## 2018-10-13 ENCOUNTER — Encounter
Admission: RE | Admit: 2018-10-13 | Discharge: 2018-10-13 | Disposition: A | Discharge status: Home / Self Care | Payer: Federal, State, Local not specified - PPO | Source: Ambulatory Visit | Attending: Obstetrics and Gynecology | Admitting: Obstetrics and Gynecology

## 2018-10-13 HISTORY — DX: Anxiety disorder, unspecified: F41.9

## 2018-10-13 NOTE — Patient Instructions (Addendum)
Your procedure is scheduled on: Thursday 10/19/18 Report to [place]. To find out your arrival time please call [phone] between 1PM - 3PM on Wednesday 10/18/18.  Remember: Instructions that are not followed completely may result in serious medical risk, up to and including death, or upon the discretion of your surgeon and anesthesiologist your surgery may need to be rescheduled.     _X__ 1. Do not eat food after midnight the night before your procedure.                 No gum chewing or hard candies. You may drink clear liquids up to 2 hours                 before you are scheduled to arrive for your surgery- DO not drink clear                 liquids within 2 hours of the start of your surgery.                 Clear Liquids include:  water, apple juice without pulp, clear carbohydrate                 drink such as Clearfast or Gatorade, Black Coffee or Tea (Do not add                 anything to coffee or tea). Diabetics water only  __X__2.  On the morning of surgery brush your teeth with toothpaste and water, you                 may rinse your mouth with mouthwash if you wish.  Do not swallow any              toothpaste of mouthwash.     _X__ 3.  No Alcohol for 24 hours before or after surgery.   _X__ 4.  Do Not Smoke or use e-cigarettes For 24 Hours Prior to Your Surgery.                 Do not use any chewable tobacco products for at least 6 hours prior to                 surgery.  ____  5.  Bring all medications with you on the day of surgery if instructed.   __X__  6.  Notify your doctor if there is any change in your medical condition      (cold, fever, infections).     Do not wear jewelry, make-up, hairpins, clips or nail polish. Do not wear lotions, powders, or perfumes.  Do not shave 48 hours prior to surgery. Men may shave face and neck. Do not bring valuables to the hospital.    [facility] is not responsible  for any belongings or valuables.  Contacts, dentures/partials or body piercings may not be worn into surgery. Bring a case for your contacts, glasses or hearing aids, a denture cup will be supplied. Leave your suitcase in the car. After surgery it may be brought to your room. For patients admitted to the hospital, discharge time is determined by your treatment team.   Patients discharged the day of surgery will not be allowed to drive home.   Please read over the following fact sheets that you were given:   MRSA Information  __X__ Take these medicines the morning of surgery with A SIP OF WATER:  1. May take clonazepam if needed for anxiety  2.   3.   4.  5.  6.  ____ Fleet Enema (as directed)   __X__ Use CHG Soap/SAGE wipes as directed  ____ Use inhalers on the day of surgery  ____ Stop metformin/Janumet/Farxiga 2 days prior to surgery    ____ Take 1/2 of usual insulin dose the night before surgery. No insulin the morning          of surgery.   ____ Stop Blood Thinners Coumadin/Plavix/Xarelto/Pleta/Pradaxa/Eliquis/Effient/Aspirin  on   Or contact your Surgeon, Cardiologist or Medical Doctor regarding  ability to stop your blood thinners  __X__ Stop Anti-inflammatories 7 days before surgery such as Advil, Ibuprofen, Motrin,  BC or Goodies Powder, Naprosyn, Naproxen, Aleve, Aspirin    __X__ Stop all herbal supplements, fish oil or vitamin E until after surgery.    ____ Bring C-Pap to the hospital.      Telephone interview. Patient verbalized understanding of instructions.

## 2018-10-16 ENCOUNTER — Other Ambulatory Visit: Payer: Self-pay

## 2018-10-16 ENCOUNTER — Other Ambulatory Visit
Admission: RE | Admit: 2018-10-16 | Discharge: 2018-10-16 | Disposition: A | Discharge status: Home / Self Care | Payer: Federal, State, Local not specified - PPO | Source: Ambulatory Visit | Attending: Obstetrics and Gynecology | Admitting: Obstetrics and Gynecology

## 2018-10-16 DIAGNOSIS — Z01812 Encounter for preprocedural laboratory examination: Secondary | ICD-10-CM | POA: Diagnosis not present

## 2018-10-16 DIAGNOSIS — Z20828 Contact with and (suspected) exposure to other viral communicable diseases: Secondary | ICD-10-CM | POA: Insufficient documentation

## 2018-10-16 LAB — SARS CORONAVIRUS 2 (TAT 6-24 HRS): SARS Coronavirus 2: NEGATIVE

## 2018-10-18 MED ORDER — SODIUM CHLORIDE 0.9 % IV SOLN
2.0000 g | INTRAVENOUS | Status: AC
  Administered 2018-10-19: 2 g via INTRAVENOUS
  Filled 2018-10-18: qty 2

## 2018-10-19 ENCOUNTER — Encounter
Admission: RE | Disposition: A | Discharge status: Home / Self Care | Payer: Self-pay | Source: Home / Self Care | Attending: Obstetrics and Gynecology

## 2018-10-19 ENCOUNTER — Ambulatory Visit: Payer: Federal, State, Local not specified - PPO | Admitting: Anesthesiology

## 2018-10-19 ENCOUNTER — Encounter: Payer: Self-pay | Admitting: Emergency Medicine

## 2018-10-19 ENCOUNTER — Ambulatory Visit
Admission: RE | Admit: 2018-10-19 | Discharge: 2018-10-19 | Disposition: A | Discharge status: Home / Self Care | Payer: Federal, State, Local not specified - PPO | Attending: Obstetrics and Gynecology | Admitting: Obstetrics and Gynecology

## 2018-10-19 ENCOUNTER — Other Ambulatory Visit: Payer: Self-pay

## 2018-10-19 DIAGNOSIS — F419 Anxiety disorder, unspecified: Secondary | ICD-10-CM | POA: Insufficient documentation

## 2018-10-19 DIAGNOSIS — N83201 Unspecified ovarian cyst, right side: Secondary | ICD-10-CM | POA: Diagnosis not present

## 2018-10-19 DIAGNOSIS — Z8742 Personal history of other diseases of the female genital tract: Secondary | ICD-10-CM | POA: Diagnosis not present

## 2018-10-19 DIAGNOSIS — Z87891 Personal history of nicotine dependence: Secondary | ICD-10-CM | POA: Insufficient documentation

## 2018-10-19 DIAGNOSIS — N809 Endometriosis, unspecified: Secondary | ICD-10-CM | POA: Diagnosis not present

## 2018-10-19 DIAGNOSIS — N736 Female pelvic peritoneal adhesions (postinfective): Secondary | ICD-10-CM | POA: Diagnosis not present

## 2018-10-19 DIAGNOSIS — K8010 Calculus of gallbladder with chronic cholecystitis without obstruction: Secondary | ICD-10-CM | POA: Insufficient documentation

## 2018-10-19 DIAGNOSIS — R102 Pelvic and perineal pain: Secondary | ICD-10-CM | POA: Diagnosis not present

## 2018-10-19 DIAGNOSIS — Z79899 Other long term (current) drug therapy: Secondary | ICD-10-CM | POA: Insufficient documentation

## 2018-10-19 DIAGNOSIS — K8020 Calculus of gallbladder without cholecystitis without obstruction: Secondary | ICD-10-CM | POA: Diagnosis not present

## 2018-10-19 DIAGNOSIS — G43909 Migraine, unspecified, not intractable, without status migrainosus: Secondary | ICD-10-CM | POA: Insufficient documentation

## 2018-10-19 DIAGNOSIS — K811 Chronic cholecystitis: Secondary | ICD-10-CM | POA: Diagnosis not present

## 2018-10-19 HISTORY — PX: CHOLECYSTECTOMY: SHX55

## 2018-10-19 HISTORY — PX: LAPAROSCOPIC OVARIAN CYSTECTOMY: SHX6248

## 2018-10-19 SURGERY — EXCISION, CYST, OVARY, LAPAROSCOPIC
Anesthesia: General | Laterality: Right

## 2018-10-19 MED ORDER — FENTANYL CITRATE (PF) 100 MCG/2ML IJ SOLN
25.0000 ug | INTRAMUSCULAR | Status: DC | PRN
  Administered 2018-10-19 (×2): 25 ug via INTRAVENOUS

## 2018-10-19 MED ORDER — ONDANSETRON HCL 4 MG/2ML IJ SOLN
INTRAMUSCULAR | Status: AC
  Filled 2018-10-19: qty 2

## 2018-10-19 MED ORDER — BUPIVACAINE-EPINEPHRINE 0.25% -1:200000 IJ SOLN
INTRAMUSCULAR | Status: DC | PRN
  Administered 2018-10-19: 30 mL

## 2018-10-19 MED ORDER — GABAPENTIN 300 MG PO CAPS
300.0000 mg | ORAL_CAPSULE | ORAL | Status: AC
  Administered 2018-10-19: 11:00:00 300 mg via ORAL

## 2018-10-19 MED ORDER — CELECOXIB 200 MG PO CAPS
200.0000 mg | ORAL_CAPSULE | ORAL | Status: AC
  Administered 2018-10-19: 11:00:00 200 mg via ORAL

## 2018-10-19 MED ORDER — CELECOXIB 200 MG PO CAPS
ORAL_CAPSULE | ORAL | Status: AC
  Administered 2018-10-19: 200 mg via ORAL
  Filled 2018-10-19: qty 1

## 2018-10-19 MED ORDER — LACTATED RINGERS IV SOLN
INTRAVENOUS | Status: DC
  Administered 2018-10-19: 12:00:00 via INTRAVENOUS

## 2018-10-19 MED ORDER — CHLORHEXIDINE GLUCONATE CLOTH 2 % EX PADS: 6.0000 | MEDICATED_PAD | Freq: Once | CUTANEOUS | Status: DC

## 2018-10-19 MED ORDER — PROPOFOL 500 MG/50ML IV EMUL
INTRAVENOUS | Status: AC
  Filled 2018-10-19: qty 50

## 2018-10-19 MED ORDER — MIDAZOLAM HCL 2 MG/2ML IJ SOLN
INTRAMUSCULAR | Status: AC
  Filled 2018-10-19: qty 2

## 2018-10-19 MED ORDER — ACETAMINOPHEN 500 MG PO TABS
1000.0000 mg | ORAL_TABLET | ORAL | Status: AC
  Administered 2018-10-19: 11:00:00 1000 mg via ORAL

## 2018-10-19 MED ORDER — FAMOTIDINE 20 MG PO TABS
ORAL_TABLET | ORAL | Status: AC
  Administered 2018-10-19: 11:00:00 20 mg via ORAL
  Filled 2018-10-19: qty 1

## 2018-10-19 MED ORDER — BUPIVACAINE-EPINEPHRINE (PF) 0.25% -1:200000 IJ SOLN
INTRAMUSCULAR | Status: AC
  Filled 2018-10-19: qty 30

## 2018-10-19 MED ORDER — HYDROCODONE-ACETAMINOPHEN 5-325 MG PO TABS: 1.0000 | ORAL_TABLET | Freq: Four times a day (QID) | ORAL | 0 refills | Status: DC | PRN

## 2018-10-19 MED ORDER — DEXAMETHASONE SODIUM PHOSPHATE 10 MG/ML IJ SOLN
INTRAMUSCULAR | Status: AC
  Filled 2018-10-19: qty 1

## 2018-10-19 MED ORDER — OXYCODONE HCL 5 MG/5ML PO SOLN: 5.0000 mg | Freq: Once | ORAL | Status: AC | PRN

## 2018-10-19 MED ORDER — SODIUM CHLORIDE 0.9 % IV SOLN
INTRAVENOUS | Status: DC | PRN
  Administered 2018-10-19: 40 ug/min via INTRAVENOUS

## 2018-10-19 MED ORDER — MIDAZOLAM HCL 2 MG/2ML IJ SOLN
INTRAMUSCULAR | Status: DC | PRN
  Administered 2018-10-19: 2 mg via INTRAVENOUS

## 2018-10-19 MED ORDER — SODIUM CHLORIDE (PF) 0.9 % IJ SOLN
INTRAMUSCULAR | Status: AC
  Filled 2018-10-19: qty 50

## 2018-10-19 MED ORDER — FENTANYL CITRATE (PF) 100 MCG/2ML IJ SOLN
INTRAMUSCULAR | Status: DC | PRN
  Administered 2018-10-19: 100 ug via INTRAVENOUS
  Administered 2018-10-19: 50 ug via INTRAVENOUS
  Administered 2018-10-19: 100 ug via INTRAVENOUS

## 2018-10-19 MED ORDER — LIDOCAINE HCL (CARDIAC) PF 100 MG/5ML IV SOSY
PREFILLED_SYRINGE | INTRAVENOUS | Status: DC | PRN
  Administered 2018-10-19: 100 mg via INTRAVENOUS

## 2018-10-19 MED ORDER — FENTANYL CITRATE (PF) 100 MCG/2ML IJ SOLN
INTRAMUSCULAR | Status: AC
  Administered 2018-10-19: 14:00:00 25 ug via INTRAVENOUS
  Filled 2018-10-19: qty 2

## 2018-10-19 MED ORDER — FENTANYL CITRATE (PF) 250 MCG/5ML IJ SOLN
INTRAMUSCULAR | Status: AC
  Filled 2018-10-19: qty 5

## 2018-10-19 MED ORDER — BUPIVACAINE HCL (PF) 0.5 % IJ SOLN
INTRAMUSCULAR | Status: AC
  Filled 2018-10-19: qty 30

## 2018-10-19 MED ORDER — LIDOCAINE HCL 4 % MT SOLN
OROMUCOSAL | Status: DC | PRN
  Administered 2018-10-19: 4 mL via TOPICAL

## 2018-10-19 MED ORDER — FAMOTIDINE 20 MG PO TABS
20.0000 mg | ORAL_TABLET | Freq: Once | ORAL | Status: AC
  Administered 2018-10-19: 11:00:00 20 mg via ORAL

## 2018-10-19 MED ORDER — ONDANSETRON HCL 4 MG/2ML IJ SOLN
INTRAMUSCULAR | Status: DC | PRN
  Administered 2018-10-19: 4 mg via INTRAVENOUS

## 2018-10-19 MED ORDER — ROCURONIUM BROMIDE 50 MG/5ML IV SOLN
INTRAVENOUS | Status: AC
  Filled 2018-10-19: qty 1

## 2018-10-19 MED ORDER — SCOPOLAMINE 1 MG/3DAYS TD PT72
MEDICATED_PATCH | TRANSDERMAL | Status: AC
  Filled 2018-10-19: qty 1

## 2018-10-19 MED ORDER — PHENYLEPHRINE HCL (PRESSORS) 10 MG/ML IV SOLN
INTRAVENOUS | Status: DC | PRN
  Administered 2018-10-19: 100 ug via INTRAVENOUS
  Administered 2018-10-19: 200 ug via INTRAVENOUS
  Administered 2018-10-19: 100 ug via INTRAVENOUS

## 2018-10-19 MED ORDER — PROPOFOL 10 MG/ML IV BOLUS
INTRAVENOUS | Status: DC | PRN
  Administered 2018-10-19: 150 mg via INTRAVENOUS

## 2018-10-19 MED ORDER — OXYCODONE HCL 5 MG PO TABS
ORAL_TABLET | ORAL | Status: AC
  Filled 2018-10-19: qty 1

## 2018-10-19 MED ORDER — PROPOFOL 10 MG/ML IV BOLUS
INTRAVENOUS | Status: AC
  Filled 2018-10-19: qty 20

## 2018-10-19 MED ORDER — OXYCODONE HCL 5 MG PO TABS
5.0000 mg | ORAL_TABLET | Freq: Once | ORAL | Status: AC | PRN
  Administered 2018-10-19: 15:00:00 5 mg via ORAL

## 2018-10-19 MED ORDER — GABAPENTIN 300 MG PO CAPS
ORAL_CAPSULE | ORAL | Status: AC
  Administered 2018-10-19: 300 mg via ORAL
  Filled 2018-10-19: qty 1

## 2018-10-19 MED ORDER — PROMETHAZINE HCL 25 MG/ML IJ SOLN: 6.2500 mg | INTRAMUSCULAR | Status: DC | PRN

## 2018-10-19 MED ORDER — ROCURONIUM BROMIDE 100 MG/10ML IV SOLN
INTRAVENOUS | Status: DC | PRN
  Administered 2018-10-19: 20 mg via INTRAVENOUS
  Administered 2018-10-19: 50 mg via INTRAVENOUS

## 2018-10-19 MED ORDER — LIDOCAINE HCL (PF) 2 % IJ SOLN
INTRAMUSCULAR | Status: AC
  Filled 2018-10-19: qty 10

## 2018-10-19 MED ORDER — PROPOFOL 500 MG/50ML IV EMUL
INTRAVENOUS | Status: DC | PRN
  Administered 2018-10-19: 130 ug/kg/min via INTRAVENOUS

## 2018-10-19 MED ORDER — DEXAMETHASONE SODIUM PHOSPHATE 10 MG/ML IJ SOLN
INTRAMUSCULAR | Status: DC | PRN
  Administered 2018-10-19: 10 mg via INTRAVENOUS

## 2018-10-19 MED ORDER — SUGAMMADEX SODIUM 200 MG/2ML IV SOLN
INTRAVENOUS | Status: DC | PRN
  Administered 2018-10-19: 200 mg via INTRAVENOUS

## 2018-10-19 MED ORDER — SUGAMMADEX SODIUM 200 MG/2ML IV SOLN
INTRAVENOUS | Status: AC
  Filled 2018-10-19: qty 2

## 2018-10-19 MED ORDER — ACETAMINOPHEN 500 MG PO TABS
ORAL_TABLET | ORAL | Status: AC
  Administered 2018-10-19: 11:00:00 1000 mg via ORAL
  Filled 2018-10-19: qty 2

## 2018-10-19 SURGICAL SUPPLY — 65 items
ANCHOR TIS RET SYS 235ML (MISCELLANEOUS) ×4 IMPLANT
APPLICATOR COTTON TIP 6 STRL (MISCELLANEOUS) ×2 IMPLANT
APPLICATOR COTTON TIP 6IN STRL (MISCELLANEOUS) ×4
APPLIER CLIP 5 13 M/L LIGAMAX5 (MISCELLANEOUS) ×4
BAG URINE DRAINAGE (UROLOGICAL SUPPLIES) ×2 IMPLANT
BLADE SURG SZ11 CARB STEEL (BLADE) ×4 IMPLANT
CANISTER SUCT 1200ML W/VALVE (MISCELLANEOUS) ×8 IMPLANT
CATH FOLEY 2WAY  5CC 16FR (CATHETERS) ×2
CATH URTH 16FR FL 2W BLN LF (CATHETERS) ×2 IMPLANT
CHLORAPREP W/TINT 26 (MISCELLANEOUS) ×8 IMPLANT
CHOLANGIOGRAM CATH TAUT (CATHETERS) IMPLANT
CLIP APPLIE 5 13 M/L LIGAMAX5 (MISCELLANEOUS) ×2 IMPLANT
COVER WAND RF STERILE (DRAPES) ×8 IMPLANT
DECANTER SPIKE VIAL GLASS SM (MISCELLANEOUS) ×4 IMPLANT
DERMABOND ADVANCED (GAUZE/BANDAGES/DRESSINGS) ×4
DERMABOND ADVANCED .7 DNX12 (GAUZE/BANDAGES/DRESSINGS) ×4 IMPLANT
DRAPE 3/4 80X56 (DRAPES) ×4 IMPLANT
DRAPE C-ARM XRAY 36X54 (DRAPES) ×8 IMPLANT
DRAPE UNDER BUTTOCK W/FLU (DRAPES) ×4 IMPLANT
ELECT CAUTERY BLADE 6.4 (BLADE) ×4 IMPLANT
ELECT REM PT RETURN 9FT ADLT (ELECTROSURGICAL) ×8
ELECTRODE REM PT RTRN 9FT ADLT (ELECTROSURGICAL) ×4 IMPLANT
GLOVE BIO SURGEON STRL SZ7 (GLOVE) ×16 IMPLANT
GLOVE INDICATOR 7.5 STRL GRN (GLOVE) ×10 IMPLANT
GOWN STRL REUS W/ TWL LRG LVL3 (GOWN DISPOSABLE) ×10 IMPLANT
GOWN STRL REUS W/TWL LRG LVL3 (GOWN DISPOSABLE) ×6
GRASPER SUT TROCAR 14GX15 (MISCELLANEOUS) ×4 IMPLANT
IRRIGATION STRYKERFLOW (MISCELLANEOUS) ×2 IMPLANT
IRRIGATOR STRYKERFLOW (MISCELLANEOUS) ×4
IV CATH ANGIO 12GX3 LT BLUE (NEEDLE) IMPLANT
IV LACTATED RINGERS 1000ML (IV SOLUTION) IMPLANT
IV NS 1000ML (IV SOLUTION) ×2
IV NS 1000ML BAXH (IV SOLUTION) ×2 IMPLANT
KIT PINK PAD W/HEAD ARE REST (MISCELLANEOUS) ×4
KIT PINK PAD W/HEAD ARM REST (MISCELLANEOUS) ×2 IMPLANT
KIT TURNOVER CYSTO (KITS) ×4 IMPLANT
L-HOOK LAP DISP 36CM (ELECTROSURGICAL) ×4
LABEL OR SOLS (LABEL) ×4 IMPLANT
LHOOK LAP DISP 36CM (ELECTROSURGICAL) ×2 IMPLANT
NEEDLE HYPO 22GX1.5 SAFETY (NEEDLE) ×8 IMPLANT
NS IRRIG 500ML POUR BTL (IV SOLUTION) ×8 IMPLANT
PACK GYN LAPAROSCOPIC (MISCELLANEOUS) ×4 IMPLANT
PACK LAP CHOLECYSTECTOMY (MISCELLANEOUS) ×4 IMPLANT
PAD OB MATERNITY 4.3X12.25 (PERSONAL CARE ITEMS) ×4 IMPLANT
PAD PREP 24X41 OB/GYN DISP (PERSONAL CARE ITEMS) ×4 IMPLANT
PENCIL ELECTRO HAND CTR (MISCELLANEOUS) ×4 IMPLANT
POUCH SPECIMEN RETRIEVAL 10MM (ENDOMECHANICALS) ×4 IMPLANT
SCISSORS METZENBAUM CVD 33 (INSTRUMENTS) ×4 IMPLANT
SET TUBE SMOKE EVAC HIGH FLOW (TUBING) ×8 IMPLANT
SHEARS HARMONIC ACE PLUS 36CM (ENDOMECHANICALS) IMPLANT
SLEEVE ENDOPATH XCEL 5M (ENDOMECHANICALS) ×12 IMPLANT
SOL PREP PVP 2OZ (MISCELLANEOUS) ×4
SOLUTION PREP PVP 2OZ (MISCELLANEOUS) ×2 IMPLANT
SPONGE LAP 18X18 RF (DISPOSABLE) ×4 IMPLANT
STOPCOCK 4 WAY LG BORE MALE ST (IV SETS) IMPLANT
SURGILUBE 2OZ TUBE FLIPTOP (MISCELLANEOUS) ×4 IMPLANT
SUT ETHIBOND 0 MO6 C/R (SUTURE) IMPLANT
SUT MNCRL AB 4-0 PS2 18 (SUTURE) ×4 IMPLANT
SUT VIC AB 2-0 UR6 27 (SUTURE) IMPLANT
SUT VICRYL 0 AB UR-6 (SUTURE) ×8 IMPLANT
SYR 20ML LL LF (SYRINGE) ×4 IMPLANT
TROCAR ENDO BLADELESS 11MM (ENDOMECHANICALS) ×2 IMPLANT
TROCAR XCEL BLUNT TIP 100MML (ENDOMECHANICALS) ×4 IMPLANT
TROCAR XCEL NON-BLD 5MMX100MML (ENDOMECHANICALS) ×8 IMPLANT
TROCAR XCEL UNIV SLVE 11M 100M (ENDOMECHANICALS) ×2 IMPLANT

## 2018-10-19 NOTE — Progress Notes (Signed)
Preoperative Review   Patient is met in the preoperative holding area. The history is reviewed in the chart and with the patient. I personally reviewed the options and rationale as well as the risks of this procedure that have been previously discussed with the patient. All questions asked by the patient and/or family were answered to their satisfaction.  Patient agrees to proceed with this procedure at this time.  Janeisha Ryle M.D. FACS   

## 2018-10-19 NOTE — Transfer of Care (Signed)
Immediate Anesthesia Transfer of Care Note  Patient: Norma Wall  Procedure(s) Performed: LAPAROSCOPIC RIGHT OVARIAN CYSTECTOMY (Right ) LAPAROSCOPIC CHOLECYSTECTOMY (N/A )  Patient Location: PACU  Anesthesia Type:General  Level of Consciousness: drowsy  Airway & Oxygen Therapy: Patient Spontanous Breathing and Patient connected to nasal cannula oxygen  Post-op Assessment: Report given to RN and Post -op Vital signs reviewed and stable  Post vital signs: Reviewed and stable  Last Vitals:  Vitals Value Taken Time  BP 104/75 (86) 10/19/18 1353  Temp 96.9   Pulse 68 10/19/18 1353  Resp 15 10/19/18 1353  SpO2 98 % 10/19/18 1353  Vitals shown include unvalidated device data.  Last Pain:  Vitals:   10/19/18 1022  TempSrc: Tympanic  PainSc: 2          Complications: No apparent anesthesia complications

## 2018-10-19 NOTE — Anesthesia Preprocedure Evaluation (Signed)
Anesthesia Evaluation  Patient identified by MRN, date of birth, ID band Patient awake    Reviewed: Allergy & Precautions, H&P , NPO status , Patient's Chart, lab work & pertinent test results  History of Anesthesia Complications (+) PONV and history of anesthetic complications (h/o PONV despite scopolamine)  Airway Mallampati: II  TM Distance: >3 FB Neck ROM: full    Dental  (+) Teeth Intact   Pulmonary neg pulmonary ROS, former smoker,           Cardiovascular (-) hypertensionnegative cardio ROS       Neuro/Psych  Headaches, PSYCHIATRIC DISORDERS Anxiety    GI/Hepatic negative GI ROS, Neg liver ROS,   Endo/Other  negative endocrine ROS  Renal/GU      Musculoskeletal   Abdominal   Peds  Hematology negative hematology ROS (+)   Anesthesia Other Findings Past Medical History: 2013: Abdominal pain, right upper quadrant No date: Anxiety 01/2013: Endometriosis No date: Gallbladder polyp 2014: Ovarian cyst     Comment:  right ovary  Past Surgical History: 2012: ABLATION     Comment:  cyst removal 02/22/2018: NECK SURGERY     Comment:  Artificial Disc replacement  No date: OVARIAN CYST REMOVAL  BMI    Body Mass Index: 35.24 kg/m      Reproductive/Obstetrics negative OB ROS                             Anesthesia Physical Anesthesia Plan  ASA: II  Anesthesia Plan: General ETT   Post-op Pain Management:    Induction:   PONV Risk Score and Plan: Ondansetron, Dexamethasone, Midazolam, TIVA, Propofol infusion and Scopolamine patch - Pre-op  Airway Management Planned:   Additional Equipment:   Intra-op Plan:   Post-operative Plan:   Informed Consent: I have reviewed the patients History and Physical, chart, labs and discussed the procedure including the risks, benefits and alternatives for the proposed anesthesia with the patient or authorized representative who has  indicated his/her understanding and acceptance.     Dental Advisory Given  Plan Discussed with: Anesthesiologist and CRNA  Anesthesia Plan Comments:         Anesthesia Quick Evaluation

## 2018-10-19 NOTE — Anesthesia Procedure Notes (Signed)
Procedure Name: Intubation Date/Time: 10/19/2018 12:21 PM Performed by: Eben Burow, CRNA Pre-anesthesia Checklist: Patient identified, Emergency Drugs available, Suction available and Patient being monitored Patient Re-evaluated:Patient Re-evaluated prior to induction Oxygen Delivery Method: Circle system utilized Preoxygenation: Pre-oxygenation with 100% oxygen Induction Type: IV induction Ventilation: Mask ventilation without difficulty Laryngoscope Size: Miller and 2 Grade View: Grade I Tube type: Oral Tube size: 7.0 mm Number of attempts: 1 Airway Equipment and Method: Stylet,  Oral airway and LTA kit utilized Placement Confirmation: ETT inserted through vocal cords under direct vision,  positive ETCO2 and breath sounds checked- equal and bilateral Secured at: 21 cm Tube secured with: Tape Dental Injury: Teeth and Oropharynx as per pre-operative assessment

## 2018-10-19 NOTE — H&P (Signed)
Obstetrics & Gynecology Surgery H&P    Chief Complaint: Scheduled Surgery   History of Present Illness: Patient is a 48 y.o. G2P2 presenting for scheduled  right ovarian cystectomy, for the treatment or further evaluation of right ovarian endometrioma in the setting of known stage II endometriosis with last laparoscopy 01/28/2013..   Prior Treatments prior to proceeding with surgery include: ultrasound evaluation   Preoperative Pap: 07/06/2018 Results: NIL HPV negative  Preoperative Endometrial biopsy: N/A status post prior ablation Preoperative Ultrasound: 08/01/2018 Findings: Normal uterus, ill defined endometriom 0.63mm consistent with history of prior ablation, and right ovarian cyst complex in appearance consistent with hemorrhagic cyst vs endometrioma 1.3 x 1.2 x 1.1cm   Review of Systems:10 point review of systems  Past Medical History:  Past Medical History:  Diagnosis Date   Abdominal pain, right upper quadrant 2013   Anxiety    Endometriosis 01/2013   Gallbladder polyp    Ovarian cyst 2014   right ovary    Past Surgical History:  Past Surgical History:  Procedure Laterality Date   ABLATION  2012   cyst removal   NECK SURGERY  02/22/2018   Artificial Disc replacement    OVARIAN CYST REMOVAL      Family History:  Family History  Problem Relation Age of Onset   Lung cancer Father    Diabetes Mother    Stroke Mother    Diabetes Maternal Grandmother    Colon cancer Neg Hx    Breast cancer Neg Hx     Social History:  Social History   Socioeconomic History   Marital status: Married    Spouse name: Not on file   Number of children: Not on file   Years of education: Not on file   Highest education level: Not on file  Occupational History   Not on file  Social Needs   Financial resource strain: Not on file   Food insecurity    Worry: Not on file    Inability: Not on file   Transportation needs    Medical: Not on file   Non-medical: Not on file  Tobacco Use   Smoking status: Former Smoker    Types: Cigarettes    Quit date: 02/01/1997    Years since quitting: 21.7   Smokeless tobacco: Never Used  Substance and Sexual Activity   Alcohol use: Yes    Comment: occassional    Drug use: No   Sexual activity: Yes    Birth control/protection: None  Lifestyle   Physical activity    Days per week: 0 days    Minutes per session: 0 min   Stress: Rather much  Relationships   Social connections    Talks on phone: More than three times a week    Gets together: More than three times a week    Attends religious service: More than 4 times per year    Active member of club or organization: No    Attends meetings of clubs or organizations: Never    Relationship status: Married   Intimate partner violence    Fear of current or ex partner: No    Emotionally abused: No    Physically abused: No    Forced sexual activity: No  Other Topics Concern   Not on file  Social History Narrative   Not on file    Allergies:  No Known Allergies  Medications: Prior to Admission medications   Medication Sig Start Date End Date Taking? Authorizing Provider  cholecalciferol (VITAMIN D3) 25 MCG (1000 UT) tablet Take 1,000 Units by mouth daily.   Yes [provider]  clonazePAM (KLONOPIN) 0.5 MG tablet TAKE 1 TABLET BY MOUTH TWICE DAILY AS NEEDED FOR ANXIETY OR PALPITATIONS Patient taking differently: Take 0.25-0.5 mg by mouth 2 (two) times daily as needed for anxiety.  10/25/17  Yes Burnette, Anderson Malta M, PA-C  fluticasone (FLONASE) 50 MCG/ACT nasal spray USE 1 SPRAY IN EACH NOSTRIL TWICE DAILY Patient taking differently: Place 2 sprays into both nostrils daily as needed for allergies or rhinitis.  04/18/18  Yes Betancourt, Aura Fey, NP  meclizine (ANTIVERT) 25 MG tablet Take 1 tablet (25 mg total) by mouth 3 (three) times daily as needed for dizziness. 07/14/18  Yes Mar Daring, PA-C  Multiple Vitamin  (MULTIVITAMIN WITH MINERALS) TABS tablet Take 1 tablet by mouth daily.   Yes [provider]  promethazine (PHENERGAN) 25 MG tablet Take 1 tablet (25 mg total) by mouth every 8 (eight) hours as needed for nausea or vomiting. 09/05/18  Yes Dutch Quint B, FNP  SUMAtriptan (IMITREX) 50 MG tablet TAKE 1 TABLET BY MOUTH IMMEDIATELY, MAY REPEAT AFTER 2 HOURS AS NEEDED Patient taking differently: Take 50 mg by mouth every 2 (two) hours as needed for migraine.  02/13/18  Yes Mar Daring, PA-C  erythromycin ophthalmic ointment Place 1 application into the right eye at bedtime. Patient not taking: Reported on 10/11/2018 09/08/18   Mar Daring, PA-C    Physical Exam Vitals: Blood pressure 116/73, pulse 63, temperature (P) 98 F (36.7 C), temperature source (P) Temporal, resp. rate 18, height 5\' 7"  (1.702 m), weight 225 lb (102.1 kg), SpO2 100 %.    General: NAD HEENT: normocephalic, anicteric Pulmonary: CTAB, No increased work of breathing Cardiovascular: RRR, distal pulses 2+ Abdomen: soft, non-tender, non-distended Genitourinary: deferred Extremities: no edema, erythema, or tenderness Neurologic: Grossly intact Psychiatric: mood appropriate, affect full  Imaging Ct Soft Tissue Neck W Contrast  Result Date: 10/02/2018 CLINICAL DATA:  Left submandibular swelling which is intermittent. EXAM: CT NECK WITH CONTRAST TECHNIQUE: Multidetector CT imaging of the neck was performed using the standard protocol following the bolus administration of intravenous contrast. CONTRAST:  1mL OMNIPAQUE IOHEXOL 300 MG/ML  SOLN COMPARISON:  None. FINDINGS: Pharynx and larynx: No mucosal or submucosal lesion. Salivary glands: Parotid glands are normal. Submandibular glands are normal. No submandibular mass or stone. No ductal dilatation. Left submandibular gland does have a slightly longer morphology, cephalo caudal measurement being 2.8 cm compared with 2.1 on the right. Therefore, it could be  palpable as a larger structure. Thyroid: Normal Lymph nodes: No adenopathy on the right. On the left, there is a level 2 node with short axis dimension of 9 mm, upper limits of normal and somewhat asymmetric to the right. This could contribute to the sensation of a left submandibular region mass. No other nodal finding of significance. Vascular: Normal Limited intracranial: Normal Visualized orbits: Normal Mastoids and visualized paranasal sinuses: Clear Skeleton: Left stylohyoid ligament is completely calcified, which also could contribute to the left-sided symptoms. This is consistent with unilateral Eagle syndrome. Upper chest: Negative Other: None IMPRESSION: Calcification of the stylohyoid ligament complex on the left. Normal on the right. 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 Left submandibular gland itself is intrinsically normal. It is slightly larger than the right, but this is not felt to be pathologic. There is a slightly asymmetrically larger level 2 lymph node on the left that  could also contribute fullness in this region, though it is at the upper limits of normal in size and does not have any other worrisome features. Electronically Signed   By: Nelson Chimes M.D.   On: 10/02/2018 13:33   Mm 3d Screen Breast Bilateral  Result Date: 09/28/2018 CLINICAL DATA:  Screening. EXAM: DIGITAL SCREENING BILATERAL MAMMOGRAM WITH TOMO AND CAD COMPARISON:  Previous exam(s). ACR Breast Density Category c: The breast tissue is heterogeneously dense, which may obscure small masses. FINDINGS: There are no findings suspicious for malignancy. Images were processed with CAD. IMPRESSION: No mammographic evidence of malignancy. A result letter of this screening mammogram will be mailed directly to the patient. RECOMMENDATION: Screening mammogram in one year. (Code:SM-B-01Y) BI-RADS CATEGORY  1: Negative. Electronically Signed   By: Nolon Nations M.D.   On: 09/28/2018 14:44     Assessment: 48 y.o. G2P2 presenting for scheduled cholecystectomy by general surgery and laparoscopic right ovarian cystectomy  Plan: 1) I have had a careful discussion with this patient about all the options available and the risk/benefits of each. I have fully informed this patient that a laparoscopy may subject her to a variety of discomforts and risks: She understands that most patients have surgery with little difficulty, but problems can happen ranging from minor to fatal. These include nausea, vomiting, pain, bleeding, infection, poor healing, hernia, or formation of adhesions. Unexpected reactions may occur from any drug or anesthetic given. Unintended injury may occur to other pelvic or abdominal structures such as Fallopian tubes, ovaries, bladder, ureter (tube from kidney to bladder), or bowel. Nerves going from the pelvis to the legs may be injured. Any such injury may require immediate or later additional surgery to correct the problem. Excessive blood loss requiring transfusion is very unlikely but possible. Dangerous blood clots may form in the legs or lungs. Physical and sexual activity will be restricted in varying degrees for an indeterminate period of time but most often 2-4 weeks. She understands that the plan is to do this laparoscopically, however, there is a chance that this will need to be performed via a larger incision. Finally, she understands that it is impossible to list every possible undesirable effect and that the condition for which surgery is done is not always cured or significantly improved, and in rare cases may be even worsen. Ample time was given to answer all questions.   2) Routine postoperative instructions were reviewed with the patient and her family in detail today including the expected length of recovery and likely postoperative course.  The patient concurred with the proposed plan, giving informed written consent for the surgery today.  Patient instructed  on the importance of being NPO after midnight prior to her procedure.  If warranted preoperative prophylactic antibiotics and SCDs ordered on call to the OR to meet SCIP guidelines and adhere to recommendation laid forth in [place] Number 104 May 2009  "Antibiotic Prophylaxis for Gynecologic Procedures".     Malachy Mood, MD, [facility], [facility] 10/19/2018, 10:18 AM

## 2018-10-19 NOTE — Op Note (Signed)
Laparoscopic Cholecystectomy  Pre-operative Diagnosis: biliary colic  Post-operative Diagnosis: same  Procedure: lap cholecystectomy  Surgeon: Caroleen Hamman, MD FACS  Anesthesia: Gen. with endotracheal tube  Findings: Chronic Cholecystitis   Estimated Blood Loss: 20cc         Drains: none         Specimens: Gallbladder           Complications: none   Procedure Details  The patient was seen again in the Holding Room. The benefits, complications, treatment options, and expected outcomes were discussed with the patient. The risks of bleeding, infection, recurrence of symptoms, failure to resolve symptoms, bile duct damage, bile duct leak, retained common bile duct stone, bowel injury, any of which could require further surgery and/or ERCP, stent, or papillotomy were reviewed with the patient. The likelihood of improving the patient's symptoms with return to their baseline status is good.  The patient and/or family concurred with the proposed plan, giving informed consent.  The patient was taken to Operating Room, identified as Karaline Wiebke and the procedure verified as Laparoscopic Cholecystectomy.  A Time Out was held and the above information confirmed.  Prior to the induction of general anesthesia, antibiotic prophylaxis was administered. VTE prophylaxis was in place. General endotracheal anesthesia was then administered and tolerated well. After the induction, the abdomen was prepped with Chloraprep and draped in the sterile fashion. The patient was positioned in the supine position.  Cut down technique was used to enter the abdominal cavity and a Hasson trochar was placed after two vicryl stitches were anchored to the fascia. Pneumoperitoneum was then created with CO2 and tolerated well without any adverse changes in the patient's vital signs.  Three 5-mm ports were placed in the right upper quadrant all under direct vision. All skin incisions  were infiltrated with a local  anesthetic agent before making the incision and placing the trocars.   The patient was positioned  in reverse Trendelenburg, tilted slightly to the patient's left.  The gallbladder was identified, the fundus grasped and retracted cephalad. Adhesions were lysed bluntly. The infundibulum was grasped and retracted laterally, exposing the peritoneum overlying the triangle of Calot. This was then divided and exposed in a blunt fashion. An extended critical view of the cystic duct and cystic artery was obtained.  The cystic duct was clearly identified and bluntly dissected.   Artery and duct were double clipped and divided.  The gallbladder was taken from the gallbladder fossa in a retrograde fashion with the electrocautery. The gallbladder was removed and placed in an Endocatch bag. The liver bed was irrigated and inspected. Hemostasis was achieved with the electrocautery. Copious irrigation was utilized and was repeatedly aspirated until clear.  The gallbladder and Endocatch sac were then removed through a port site.    Inspection of the right upper quadrant was performed. No bleeding, bile duct injury or leak, or bowel injury was noted. Pneumoperitoneum was released.  The periumbilical port site was closed with interrumpted 0 Vicryl sutures. 4-0 subcuticular Monocryl was used to close the skin. Dermabond was  applied.  The patient was then extubated and brought to the recovery room in stable condition. Sponge, lap, and needle counts were correct at closure and at the conclusion of the case.               Caroleen Hamman, MD, FACS

## 2018-10-19 NOTE — Anesthesia Post-op Follow-up Note (Signed)
Anesthesia QCDR form completed.        

## 2018-10-19 NOTE — Op Note (Signed)
Preoperative Diagnosis: 1) 48 y.o. with right ovarian cyst 2) Laparoscopically proven stage II endometriosis in 2014  Postoperative Diagnosis: 1) 48 y.o. normal right ovary with no evidence of ovarian cyst 2) Quiescent endometriosis   Operation Performed: Diastnostic laparoscopy, see Dr. Candis Schatz note for cholecystectomy portion of the case  Indication: 48 y.o. G2P2 with complex right ovarian cyst hemorrhagic vs endometrioma  Surgeon: Malachy Mood, MD  Estimated Blood Loss: 20 mL  Drains or Tubes: none  Implants: none  Specimens Removed: none  Complications: none  Intraoperative Findings: Normal tubes, ovaries, and uterus.  The right ovary was normal in appearance.  The left ovary had some adhesive disease to the left pelvic sidewall.  There was one thicker adhesion of the uterus to the sigmoid colon.  No evidence of active disease in the cule de sac or ovarian fossa.  Clear anterior cul de sac.  Patient Condition: stable  Procedure in Detail:  Following cholecystectomy attention was turned to the patient's pelvis.  An indewelling foley catheter had been placed preoperatively to decompress the bladder, uterine manipulator was not required for visualization.  The uterus appeared normal in contour, both fallopian tubes were visualized and appeared normal.  The right ovary was elevated and not to be free of cystic changes.  The left ovary was likewise normal in appearance although is was adhered to the pelvic sidewall.  Posterior cul de sac was inspected and free of active endoemtriotic implants.  There was one thicker tissue bridge between the posterior uterus and sigmoid colon.  Following inspection of the pelvis that case was turned back over to Dr. Dahlia Byes for closure.

## 2018-10-20 NOTE — Anesthesia Postprocedure Evaluation (Signed)
Anesthesia Post Note  Patient: Norma Wall  Procedure(s) Performed: LAPAROSCOPIC RIGHT OVARIAN CYSTECTOMY (Right ) LAPAROSCOPIC CHOLECYSTECTOMY (N/A )  Patient location during evaluation: PACU Anesthesia Type: General Level of consciousness: awake and alert Pain management: pain level controlled Vital Signs Assessment: post-procedure vital signs reviewed and stable Respiratory status: spontaneous breathing, nonlabored ventilation, respiratory function stable and patient connected to nasal cannula oxygen Cardiovascular status: blood pressure returned to baseline and stable Postop Assessment: no apparent nausea or vomiting Anesthetic complications: no     Last Vitals:  Vitals:   10/19/18 1508 10/19/18 1558  BP: (P) 92/64 116/73  Pulse: (P) 60 63  Resp: (P) 18 18  Temp: (P) 36.7 C   SpO2: (P) 95% 100%    Last Pain:  Vitals:   10/19/18 1558  TempSrc:   PainSc: 3                  Ravin Denardo S

## 2018-10-23 LAB — SURGICAL PATHOLOGY

## 2018-10-26 ENCOUNTER — Telehealth: Payer: Self-pay | Admitting: Obstetrics and Gynecology

## 2018-10-26 NOTE — Telephone Encounter (Signed)
Patient is calling to see if she needs to come in today to be seen. Patient had surgery last Thursday and has developed a rash. Please advise work in ?

## 2018-10-26 NOTE — Telephone Encounter (Signed)
She can send a picture on my chart, also majority of the procedure like initial port placement and closure was done by Dr. Perrin Maltese

## 2018-10-26 NOTE — Telephone Encounter (Signed)
Called and notified patient.

## 2018-10-31 ENCOUNTER — Ambulatory Visit (INDEPENDENT_AMBULATORY_CARE_PROVIDER_SITE_OTHER): Payer: Federal, State, Local not specified - PPO | Admitting: Obstetrics and Gynecology

## 2018-10-31 ENCOUNTER — Other Ambulatory Visit: Payer: Self-pay

## 2018-10-31 ENCOUNTER — Encounter: Payer: Self-pay | Admitting: Obstetrics and Gynecology

## 2018-10-31 VITALS — BP 100/66 | HR 76 | Ht 67.0 in | Wt 227.0 lb

## 2018-10-31 DIAGNOSIS — Z4889 Encounter for other specified surgical aftercare: Secondary | ICD-10-CM

## 2018-10-31 NOTE — Progress Notes (Signed)
      Postoperative Follow-up Patient presents post op from laparoscopic cholecystectomy and diagnostic laparoscopy 2weeks ago for history of endometrioisis and right sided ovarian cyst.  Subjective: Patient reports some improvement in her preop symptoms. Eating a regular diet without difficulty. Pain is controlled without any medications.  Activity: normal activities of daily living.  Objective: Blood pressure 100/66, pulse 76, height 5\' 7"  (1.702 m), weight 227 lb (103 kg).  General: NAD Pulmonary: no increased work of breathing Abdomen: soft, non-tender, non-distended, incision(s) D/C/I.  The supraumbilical port site is slightly opened, no evidence of induration, erythema, or discharge.  Fine miliary rash at sites of chlorhexidine application Extremities: no edema Neurologic: normal gait   Admission on 10/19/2018, Discharged on 10/19/2018  Component Date Value Ref Range Status  . SURGICAL PATHOLOGY 10/19/2018    Final-Edited                   Value:SURGICAL PATHOLOGY CASE: [id number] PATIENT: [facility] Surgical Pathology Report     Specimen Submitted: A. Gallbladder  Clinical History: R10.2 pelvic pian, N83.201 right ovarian cyst, K80.20 cholelithiasis    DIAGNOSIS: A. GALLBLADDER; CHOLECYSTECTOMY: - CHRONIC CHOLECYSTITIS WITH CHOLELITHIASIS. - NEGATIVE FOR ATYPIA AND MALIGNANCY.  GROSS DESCRIPTION: A. Labeled: Gallbladder in formalin Received: In formalin Size of specimen: 8.5 x 3.0 x 2.5 cm Specimen integrity: Intact External surface: Green smooth and shiny Wall thickness: 0.3 cm Mucosa: Green soft and trabeculated Cystic duct: Open, 0.2 cm in diameter Bile present: Green and viscous Stones present: 3 yellow firm stones with granular surface ranging from 1.0 to 1.4 cm in greatest dimension Other findings: Not identified  Block summary: 1 -representative sections including cystic duct   Final Diagnosis performed by Allena Napoleon, MD.    Electronically signed 10/23/2018 2:13:35PM The el                         ectronic signature indicates that the named Attending Pathologist has evaluated the specimen Technical component performed at [facility], [address] [place], [place] [address] Lab: [phone] Dir: Rush Farmer, MD, MMM  Professional component performed at [facility], [facility], [place], [place], [place] [address] Lab: [phone] Dir: Dellia Nims. Reuel Derby, MD     Assessment: 48 y.o. s/p diagnostic laparoscopy stable  Plan: Patient has done well after surgery with no apparent complications.  I have discussed the post-operative course to date, and the expected progress moving forward.  The patient understands what complications to be concerned about.  I will see the patient in routine follow up, or sooner if needed.    Activity plan: No restriction.  No evidence of active endometriosis.  Discussed expectant management, northindrone, or orilissa.     Malachy Mood, MD, [facility], [facility] 10/31/2018, 11:00 AM

## 2018-11-03 ENCOUNTER — Other Ambulatory Visit: Payer: Self-pay

## 2018-11-03 ENCOUNTER — Ambulatory Visit (INDEPENDENT_AMBULATORY_CARE_PROVIDER_SITE_OTHER): Payer: Federal, State, Local not specified - PPO | Admitting: Physician Assistant

## 2018-11-03 ENCOUNTER — Encounter: Payer: Self-pay | Admitting: Physician Assistant

## 2018-11-03 VITALS — BP 109/78 | HR 81 | Temp 97.9°F | Ht 67.0 in | Wt 226.0 lb

## 2018-11-03 DIAGNOSIS — Z09 Encounter for follow-up examination after completed treatment for conditions other than malignant neoplasm: Secondary | ICD-10-CM

## 2018-11-03 DIAGNOSIS — K8020 Calculus of gallbladder without cholecystitis without obstruction: Secondary | ICD-10-CM

## 2018-11-03 NOTE — Patient Instructions (Signed)

## 2018-11-03 NOTE — Progress Notes (Signed)
[facility] POST-OP OFFICE VISIT  11/03/2018  HPI: Norma Wall is a 48 y.o. female 15 days s/p laparoscopic cholecystectomy with Dr Dahlia Byes.   Overall, she reports that she is doping well. She had some issues with RUQ discomfort after the procedure but this has subsided. Her umbilical incision dehisced some and she was using steri-strips at home but stopped yesterday secondary to skin irritation. The wound has closed now. No erythema or drainage. Otherwise no fever, chills, nausea, or emesis. Tolerating PO intake. Looser stools but not bothersome. No other complaints.   Vital signs: BP 109/78   Pulse 81   Temp 97.9 F (36.6 C) (Temporal)   Ht 5\' 7"  (1.702 m)   Wt 226 lb (102.5 kg)   SpO2 97%   BMI 35.40 kg/m    Physical Exam: Constitutional: Well appearing female, NAD Abdomen: Soft, non-distended, non-tender, no rebound/guarding.  Skin: Laparoscopic incisions are healing well. The umbilical incision is scabbed over, no drainage. Ecchymosis near epigastric incision. No erythema or drainage.   Assessment/Plan: This is a 48 y.o. female 15 days s/p laparoscopic cholecystectomy   - pain control prn  - okay to shower  - complete at least 4 weeks lifting restrictions; gradually increase activity  - imodium if develops bothersome diarrhea  - reviewed pathology: chronic cholecystitis, negative for malignancy  - rtc prn  -- Edison Simon, PA-C [facility] 11/03/2018, 9:14 AM [phone] M-F: 7am - 4pm

## 2018-11-07 DIAGNOSIS — F4322 Adjustment disorder with anxiety: Secondary | ICD-10-CM | POA: Diagnosis not present

## 2018-11-17 NOTE — Progress Notes (Deleted)
Patient: Norma Wall, Female    DOB: 29-Apr-1970, 47 y.o.   MRN: [id number] Visit Date: 11/17/2018  Today's Provider: Mar Daring, PA-C   No chief complaint on file.  Subjective:     Annual physical exam Denim Maillard is a 48 y.o. female who presents today for health maintenance and complete physical. She feels {DESC; WELL/FAIRLY WELL/POORLY:18703}. She reports exercising ***. She reports she is sleeping {DESC; WELL/FAIRLY WELL/POORLY:18703}.  -----------------------------------------------------------------   Review of Systems  Social History      She  reports that she quit smoking about 21 years ago. Her smoking use included cigarettes. She has never used smokeless tobacco. She reports current alcohol use. She reports that she does not use drugs.       Social History   Socioeconomic History  . Marital status: Married    Spouse name: Not on file  . Number of children: Not on file  . Years of education: Not on file  . Highest education level: Not on file  Occupational History  . Not on file  Social Needs  . Financial resource strain: Not on file  . Food insecurity    Worry: Not on file    Inability: Not on file  . Transportation needs    Medical: Not on file    Non-medical: Not on file  Tobacco Use  . Smoking status: Former Smoker    Types: Cigarettes    Quit date: 02/01/1997    Years since quitting: 21.8  . Smokeless tobacco: Never Used  Substance and Sexual Activity  . Alcohol use: Yes    Comment: occassional   . Drug use: No  . Sexual activity: Yes    Birth control/protection: None  Lifestyle  . Physical activity    Days per week: 0 days    Minutes per session: 0 min  . Stress: Rather much  Relationships  . Social connections    Talks on phone: More than three times a week    Gets together: More than three times a week    Attends religious service: More than 4 times per year    Active member of club or organization: No     Attends meetings of clubs or organizations: Never    Relationship status: Married  Other Topics Concern  . Not on file  Social History Narrative  . Not on file    Past Medical History:  Diagnosis Date  . Abdominal pain, right upper quadrant 2013  . Anxiety   . Endometriosis 01/2013  . Gallbladder polyp   . Ovarian cyst 2014   right ovary     Patient Active Problem List   Diagnosis Date Noted  . Endometriosis determined by laparoscopy 07/06/2018  . Anxiety, generalized 09/19/2017  . Atypical chest pain 08/26/2017  . Pruritus ani 08/24/2016  . History of rectal bleeding 08/24/2016  . Obesity (BMI 30-39.9) 11/20/2014  . Dermatitis, eczematoid 11/20/2014  . External hemorrhoid 11/20/2014  . High potassium 11/20/2014  . Bursitis 11/20/2014  . Migraine 08/28/2014  . Gallbladder polyp 09/13/2012  . Avitaminosis D 05/19/2009  . Bloodgood disease 04/29/2009    Past Surgical History:  Procedure Laterality Date  . ABLATION  2012   cyst removal  . CHOLECYSTECTOMY N/A 10/19/2018   Procedure: LAPAROSCOPIC CHOLECYSTECTOMY;  Surgeon: Jules Husbands, MD;  Location: [facility] ORS;  Service: General;  Laterality: N/A;  . LAPAROSCOPIC OVARIAN CYSTECTOMY Right 10/19/2018   Procedure: LAPAROSCOPIC RIGHT OVARIAN CYSTECTOMY;  Surgeon:  Malachy Mood, MD;  Location: [facility] ORS;  Service: Gynecology;  Laterality: Right;  . NECK SURGERY  02/22/2018   Artificial Disc replacement   . OVARIAN CYST REMOVAL      Family History        Family Status  Relation Name Status  . Father  Deceased  . Mother  Deceased  . Brother  Alive  . MGM  (Not Specified)  . Neg Hx  (Not Specified)        Her family history includes Diabetes in her maternal grandmother and mother; Lung cancer in her father; Stroke in her mother. There is no history of Colon cancer or Breast cancer.      No Known Allergies   Current Outpatient Medications:  .  cholecalciferol (VITAMIN D3) 25 MCG (1000 UT) tablet, Take 1,000  Units by mouth daily., Disp: , Rfl:  .  clonazePAM (KLONOPIN) 0.5 MG tablet, TAKE 1 TABLET BY MOUTH TWICE DAILY AS NEEDED FOR ANXIETY OR PALPITATIONS (Patient taking differently: Take 0.25-0.5 mg by mouth 2 (two) times daily as needed for anxiety. ), Disp: 60 tablet, Rfl: 5 .  fluticasone (FLONASE) 50 MCG/ACT nasal spray, USE 1 SPRAY IN EACH NOSTRIL TWICE DAILY (Patient taking differently: Place 2 sprays into both nostrils daily as needed for allergies or rhinitis. ), Disp: 16 g, Rfl: 1 .  meclizine (ANTIVERT) 25 MG tablet, Take 1 tablet (25 mg total) by mouth 3 (three) times daily as needed for dizziness., Disp: 30 tablet, Rfl: 0 .  Multiple Vitamin (MULTIVITAMIN WITH MINERALS) TABS tablet, Take 1 tablet by mouth daily., Disp: , Rfl:  .  promethazine (PHENERGAN) 25 MG tablet, Take 1 tablet (25 mg total) by mouth every 8 (eight) hours as needed for nausea or vomiting., Disp: 20 tablet, Rfl: 0 .  SUMAtriptan (IMITREX) 50 MG tablet, TAKE 1 TABLET BY MOUTH IMMEDIATELY, MAY REPEAT AFTER 2 HOURS AS NEEDED (Patient taking differently: Take 50 mg by mouth every 2 (two) hours as needed for migraine. ), Disp: 10 tablet, Rfl: 5   Patient Care Team: Rubye Beach as PCP - General (Family Medicine) Bary Castilla, Forest Gleason, MD (General Surgery) Margarita Rana, MD as Referring Physician (Family Medicine) Mar Daring, PA-C as Physician Assistant (Family Medicine)    Objective:    Vitals: There were no vitals taken for this visit.  There were no vitals filed for this visit.   Physical Exam   Depression Screen PHQ 2/9 Scores 09/27/2017 11/08/2016 09/13/2016  PHQ - 2 Score 0 0 0       Assessment & Plan:     Routine Health Maintenance and Physical Exam  Exercise Activities and Dietary recommendations Goals   None     Immunization History  Administered Date(s) Administered  . Influenza Split 11/15/2016  . Influenza,inj,Quad PF,6+ Mos 11/15/2016  . Tdap 04/06/2010    Health  Maintenance  Topic Date Due  . INFLUENZA VACCINE  09/02/2018  . TETANUS/TDAP  04/05/2020  . PAP SMEAR-Modifier  07/05/2021  . HIV Screening  Completed     Discussed health benefits of physical activity, and encouraged her to engage in regular exercise appropriate for her age and condition.    --------------------------------------------------------------------    Mar Daring, PA-C  [place]

## 2018-11-20 ENCOUNTER — Telehealth: Payer: Self-pay | Admitting: Physician Assistant

## 2018-11-20 ENCOUNTER — Other Ambulatory Visit: Payer: Self-pay

## 2018-11-20 ENCOUNTER — Encounter: Payer: Federal, State, Local not specified - PPO | Admitting: Physician Assistant

## 2018-11-20 DIAGNOSIS — Z20822 Contact with and (suspected) exposure to covid-19: Secondary | ICD-10-CM

## 2018-11-20 NOTE — Telephone Encounter (Signed)
Pt has an appt today at 10:00 for CPE.  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  She wants to know if she can come in the office,  [place]

## 2018-11-20 NOTE — Telephone Encounter (Signed)
Patient was advised not to come in and we can see her in another time until her quarantine is done.

## 2018-11-21 ENCOUNTER — Encounter: Payer: Self-pay | Admitting: Physician Assistant

## 2018-11-22 LAB — NOVEL CORONAVIRUS, NAA: SARS-CoV-2, NAA: NOT DETECTED

## 2018-11-24 DIAGNOSIS — F4322 Adjustment disorder with anxiety: Secondary | ICD-10-CM | POA: Diagnosis not present

## 2018-12-08 ENCOUNTER — Ambulatory Visit: Payer: Federal, State, Local not specified - PPO | Admitting: Obstetrics and Gynecology

## 2018-12-18 ENCOUNTER — Other Ambulatory Visit: Payer: Self-pay

## 2018-12-18 ENCOUNTER — Encounter: Payer: Self-pay | Admitting: Obstetrics and Gynecology

## 2018-12-18 ENCOUNTER — Ambulatory Visit (INDEPENDENT_AMBULATORY_CARE_PROVIDER_SITE_OTHER): Payer: Federal, State, Local not specified - PPO | Admitting: Obstetrics and Gynecology

## 2018-12-18 VITALS — BP 112/72 | HR 73 | Ht 67.0 in | Wt 232.0 lb

## 2018-12-18 DIAGNOSIS — Z4889 Encounter for other specified surgical aftercare: Secondary | ICD-10-CM

## 2018-12-19 ENCOUNTER — Other Ambulatory Visit: Payer: Self-pay | Admitting: Obstetrics and Gynecology

## 2018-12-19 DIAGNOSIS — N939 Abnormal uterine and vaginal bleeding, unspecified: Secondary | ICD-10-CM

## 2018-12-19 NOTE — Telephone Encounter (Signed)
Lab appointment Thursday order is in

## 2018-12-20 NOTE — Telephone Encounter (Signed)
Patient is schedule Thursday, 12/21/18

## 2018-12-21 ENCOUNTER — Other Ambulatory Visit: Payer: Federal, State, Local not specified - PPO

## 2018-12-21 ENCOUNTER — Other Ambulatory Visit: Payer: Self-pay

## 2018-12-21 DIAGNOSIS — N939 Abnormal uterine and vaginal bleeding, unspecified: Secondary | ICD-10-CM

## 2018-12-21 NOTE — Progress Notes (Signed)
      Postoperative Follow-up Patient presents post op from diagnostic laparoscopy at the time of cholecystectomy 6weeks ago for cholelithiasis and pelvic pain.  Subjective: Patient reports marked improvement in her preop symptoms. Eating a regular diet without difficulty. The patient is not having any pain.  Activity: normal activities of daily living.  She is status post prior endometrial ablation still has some intermittent spotting  Objective: Blood pressure 112/72, pulse 73, height 5\' 7"  (1.702 m), weight 232 lb (105.2 kg).  General: NAD Pulmonary: no increased work of breathing Abdomen: soft, non-tender, non-distended, incision(s) D/C/I Extremities: no edema Neurologic: normal gait    Orders Only on 11/20/2018  Component Date Value Ref Range Status  . SARS-CoV-2, NAA 11/20/2018 Not Detected  Not Detected Final   Comment: This nucleic acid amplification test was developed and its performance characteristics determined by [employer]. Nucleic acid amplification tests include PCR and TMA. This test has not been FDA cleared or approved. This test has been authorized by FDA under an Emergency Use Authorization (EUA). This test is only authorized for the duration of time the declaration that circumstances exist justifying the authorization of the emergency use of in vitro diagnostic tests for detection of SARS-CoV-2 virus and/or diagnosis of COVID-19 infection under section 564(b)(1) of the Act, 21 U.S.C. PT:2852782) (1), unless the authorization is terminated or revoked sooner. When diagnostic testing is negative, the possibility of a false negative result should be considered in the context of a patient's recent exposures and the presence of clinical signs and symptoms consistent with COVID-19. An individual without symptoms of COVID-19 and who is not shedding SARS-CoV-2 virus would                           expect to have a negative (not detected) result in this  assay.     Assessment: 48 y.o. s/p diagnostic laparoscopy at the time of cholecystectomy stable  Plan: Patient has done well after surgery with no apparent complications.  I have discussed the post-operative course to date, and the expected progress moving forward.  The patient understands what complications to be concerned about.  I will see the patient in routine follow up, or sooner if needed.    Activity plan: No restriction.  Will check FSH/Estradiol level with next cycle suspect not postmenopausal to date   Malachy Mood, MD, [place], [place]

## 2018-12-22 LAB — FOLLICLE STIMULATING HORMONE: FSH: 6.7 m[IU]/mL

## 2018-12-22 LAB — ESTRADIOL: Estradiol: 191 pg/mL

## 2019-01-02 DIAGNOSIS — I890 Lymphedema, not elsewhere classified: Secondary | ICD-10-CM

## 2019-01-02 HISTORY — DX: Lymphedema, not elsewhere classified: I89.0

## 2019-01-05 ENCOUNTER — Ambulatory Visit (INDEPENDENT_AMBULATORY_CARE_PROVIDER_SITE_OTHER): Payer: Federal, State, Local not specified - PPO | Admitting: Physician Assistant

## 2019-01-05 ENCOUNTER — Encounter: Payer: Self-pay | Admitting: Physician Assistant

## 2019-01-05 ENCOUNTER — Other Ambulatory Visit: Payer: Self-pay

## 2019-01-05 VITALS — BP 132/74 | HR 78 | Temp 97.3°F | Resp 16 | Ht 67.0 in | Wt 232.4 lb

## 2019-01-05 DIAGNOSIS — Z23 Encounter for immunization: Secondary | ICD-10-CM

## 2019-01-05 DIAGNOSIS — Z0000 Encounter for general adult medical examination without abnormal findings: Secondary | ICD-10-CM | POA: Diagnosis not present

## 2019-01-05 DIAGNOSIS — R609 Edema, unspecified: Secondary | ICD-10-CM | POA: Diagnosis not present

## 2019-01-05 DIAGNOSIS — E875 Hyperkalemia: Secondary | ICD-10-CM

## 2019-01-05 DIAGNOSIS — M2420 Disorder of ligament, unspecified site: Secondary | ICD-10-CM

## 2019-01-05 DIAGNOSIS — Z6836 Body mass index (BMI) 36.0-36.9, adult: Secondary | ICD-10-CM

## 2019-01-05 DIAGNOSIS — E559 Vitamin D deficiency, unspecified: Secondary | ICD-10-CM

## 2019-01-05 NOTE — Patient Instructions (Signed)
Health Maintenance, Female Adopting a healthy lifestyle and getting preventive care are important in promoting health and wellness. Ask your health care provider about:  The right schedule for you to have regular tests and exams.  Things you can do on your own to prevent diseases and keep yourself healthy. What should I know about diet, weight, and exercise? Eat a healthy diet   Eat a diet that includes plenty of vegetables, fruits, low-fat dairy products, and lean protein.  Do not eat a lot of foods that are high in solid fats, added sugars, or sodium. Maintain a healthy weight Body mass index (BMI) is used to identify weight problems. It estimates body fat based on height and weight. Your health care provider can help determine your BMI and help you achieve or maintain a healthy weight. Get regular exercise Get regular exercise. This is one of the most important things you can do for your health. Most adults should:  Exercise for at least 150 minutes each week. The exercise should increase your heart rate and make you sweat (moderate-intensity exercise).  Do strengthening exercises at least twice a week. This is in addition to the moderate-intensity exercise.  Spend less time sitting. Even light physical activity can be beneficial. Watch cholesterol and blood lipids Have your blood tested for lipids and cholesterol at 48 years of age, then have this test every 5 years. Have your cholesterol levels checked more often if:  Your lipid or cholesterol levels are high.  You are older than 48 years of age.  You are at high risk for heart disease. What should I know about cancer screening? Depending on your health history and family history, you may need to have cancer screening at various ages. This may include screening for:  Breast cancer.  Cervical cancer.  Colorectal cancer.  Skin cancer.  Lung cancer. What should I know about heart disease, diabetes, and high blood  pressure? Blood pressure and heart disease  High blood pressure causes heart disease and increases the risk of stroke. This is more likely to develop in people who have high blood pressure readings, are of African descent, or are overweight.  Have your blood pressure checked: ? Every 3-5 years if you are 18-39 years of age. ? Every year if you are 40 years old or older. Diabetes Have regular diabetes screenings. This checks your fasting blood sugar level. Have the screening done:  Once every three years after age 40 if you are at a normal weight and have a low risk for diabetes.  More often and at a younger age if you are overweight or have a high risk for diabetes. What should I know about preventing infection? Hepatitis B If you have a higher risk for hepatitis B, you should be screened for this virus. Talk with your health care provider to find out if you are at risk for hepatitis B infection. Hepatitis C Testing is recommended for:  Everyone born from 1945 through 1965.  Anyone with known risk factors for hepatitis C. Sexually transmitted infections (STIs)  Get screened for STIs, including gonorrhea and chlamydia, if: ? You are sexually active and are younger than 48 years of age. ? You are older than 48 years of age and your health care provider tells you that you are at risk for this type of infection. ? Your sexual activity has changed since you were last screened, and you are at increased risk for chlamydia or gonorrhea. Ask your health care provider if   you are at risk.  Ask your health care provider about whether you are at high risk for HIV. Your health care provider may recommend a prescription medicine to help prevent HIV infection. If you choose to take medicine to prevent HIV, you should first get tested for HIV. You should then be tested every 3 months for as long as you are taking the medicine. Pregnancy  If you are about to stop having your period (premenopausal) and  you may become pregnant, seek counseling before you get pregnant.  Take 400 to 800 micrograms (mcg) of folic acid every day if you become pregnant.  Ask for birth control (contraception) if you want to prevent pregnancy. Osteoporosis and menopause Osteoporosis is a disease in which the bones lose minerals and strength with aging. This can result in bone fractures. If you are 65 years old or older, or if you are at risk for osteoporosis and fractures, ask your health care provider if you should:  Be screened for bone loss.  Take a calcium or vitamin D supplement to lower your risk of fractures.  Be given hormone replacement therapy (HRT) to treat symptoms of menopause. Follow these instructions at home: Lifestyle  Do not use any products that contain nicotine or tobacco, such as cigarettes, e-cigarettes, and chewing tobacco. If you need help quitting, ask your health care provider.  Do not use street drugs.  Do not share needles.  Ask your health care provider for help if you need support or information about quitting drugs. Alcohol use  Do not drink alcohol if: ? Your health care provider tells you not to drink. ? You are pregnant, may be pregnant, or are planning to become pregnant.  If you drink alcohol: ? Limit how much you use to 0-1 drink a day. ? Limit intake if you are breastfeeding.  Be aware of how much alcohol is in your drink. In the [place] one drink equals one 12 oz bottle of beer (355 mL), one 5 oz glass of wine (148 mL), or one 1 oz glass of hard liquor (44 mL). General instructions  Schedule regular health, dental, and eye exams.  Stay current with your vaccines.  Tell your health care provider if: ? You often feel depressed. ? You have ever been abused or do not feel safe at home. Summary  Adopting a healthy lifestyle and getting preventive care are important in promoting health and wellness.  Follow your health care provider's instructions about healthy  diet, exercising, and getting tested or screened for diseases.  Follow your health care provider's instructions on monitoring your cholesterol and blood pressure. This information is not intended to replace advice given to you by your health care provider. Make sure you discuss any questions you have with your health care provider. Document Released: 08/03/2010 Document Revised: 01/11/2018 Document Reviewed: 01/11/2018 Elsevier Patient Education  2020 [employer] Inc[employer]  

## 2019-01-05 NOTE — Progress Notes (Signed)
Patient: Norma Wall, Female    DOB: [birth year], 48 y.o.   MRN: [id number] Visit Date: 01/05/2019  Today's Provider: Mar Daring, PA-C   Chief Complaint  Patient presents with  . Annual Exam   Subjective:     Annual physical exam [name] is a 48 y.o. female who presents today for health maintenance and complete physical. She feels well. She reports exercising none. She reports she is sleeping well. ----------------------------------------------------------------- Pap:07/06/2018 Negative and HPV negative Mammogram:09/27/18-Normal  Patient would like to talk to provider about Lipedema and her symptoms like easy bruising and lumps on her legs.  She is also with c/o irregular pain on right side of neck. Mother passed from CVA. Worried about vascular issues. Does have h/o herniated disc that caused radicular symptoms on the right and had to undergo surgical fixation.   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 Kathyrn Sheriff.   Review of Systems  Constitutional: Positive for unexpected weight change ("eagle syndrome").  HENT: Negative.   Eyes: Negative.   Respiratory: Negative.   Cardiovascular: Positive for leg swelling ("bruising/lumps").  Gastrointestinal: Negative.   Endocrine: Negative.   Genitourinary: Negative.   Musculoskeletal: Positive for arthralgias.  Skin: Negative.   Allergic/Immunologic: Negative.   Neurological: Negative.   Hematological: Bruises/bleeds easily.  Psychiatric/Behavioral: Negative.     Social History      She  reports that she quit smoking about 21 years ago. Her smoking use included cigarettes. She has never used smokeless tobacco. She reports current alcohol use. She reports that she does not use drugs.       Social History   Socioeconomic History  . Marital status: Married    Spouse name: Not on file  . Number of children: Not on file  . Years of education: Not on file  . Highest  education level: Not on file  Occupational History  . Not on file  Social Needs  . Financial resource strain: Not on file  . Food insecurity    Worry: Not on file    Inability: Not on file  . Transportation needs    Medical: Not on file    Non-medical: Not on file  Tobacco Use  . Smoking status: Former Smoker    Types: Cigarettes    Quit date: 02/01/1997    Years since quitting: 21.9  . Smokeless tobacco: Never Used  Substance and Sexual Activity  . Alcohol use: Yes    Comment: occassional   . Drug use: No  . Sexual activity: Yes    Birth control/protection: None  Lifestyle  . Physical activity    Days per week: 0 days    Minutes per session: 0 min  . Stress: Rather much  Relationships  . Social connections    Talks on phone: More than three times a week    Gets together: More than three times a week    Attends religious service: More than 4 times per year    Active member of club or organization: No    Attends meetings of clubs or organizations: Never    Relationship status: Married  Other Topics Concern  . Not on file  Social History Narrative  . Not on file    Past Medical History:  Diagnosis Date  . Abdominal pain, right upper quadrant 2013  . Anxiety   . Endometriosis 01/2013  . Gallbladder polyp   . Ovarian cyst 2014   right ovary  Patient Active Problem List   Diagnosis Date Noted  . Endometriosis determined by laparoscopy 07/06/2018  . Anxiety, generalized 09/19/2017  . Atypical chest pain 08/26/2017  . Pruritus ani 08/24/2016  . History of rectal bleeding 08/24/2016  . Obesity (BMI 30-39.9) 11/20/2014  . Dermatitis, eczematoid 11/20/2014  . External hemorrhoid 11/20/2014  . High potassium 11/20/2014  . Bursitis 11/20/2014  . Migraine 08/28/2014  . Gallbladder polyp 09/13/2012  . Avitaminosis D 05/19/2009  . Bloodgood disease 04/29/2009    Past Surgical History:  Procedure Laterality Date  . ABLATION  2012   cyst removal  .  CHOLECYSTECTOMY N/A 10/19/2018   Procedure: LAPAROSCOPIC CHOLECYSTECTOMY;  Surgeon: Jules Husbands, MD;  Location: [facility] ORS;  Service: General;  Laterality: N/A;  . LAPAROSCOPIC OVARIAN CYSTECTOMY Right 10/19/2018   Procedure: LAPAROSCOPIC RIGHT OVARIAN CYSTECTOMY;  Surgeon: Malachy Mood, MD;  Location: [facility] ORS;  Service: Gynecology;  Laterality: Right;  . NECK SURGERY  02/22/2018   Artificial Disc replacement   . OVARIAN CYST REMOVAL      Family History        Family Status  Relation Name Status  . Father  Deceased  . Mother  Deceased  . Brother  Alive  . MGM  (Not Specified)  . Neg Hx  (Not Specified)        Her family history includes Diabetes in her maternal grandmother and mother; Lung cancer in her father; Stroke in her mother. There is no history of Colon cancer or Breast cancer.      No Known Allergies   Current Outpatient Medications:  .  cholecalciferol (VITAMIN D3) 25 MCG (1000 UT) tablet, Take 1,000 Units by mouth daily., Disp: , Rfl:  .  clonazePAM (KLONOPIN) 0.5 MG tablet, TAKE 1 TABLET BY MOUTH TWICE DAILY AS NEEDED FOR ANXIETY OR PALPITATIONS (Patient taking differently: Take 0.25-0.5 mg by mouth 2 (two) times daily as needed for anxiety. ), Disp: 60 tablet, Rfl: 5 .  fluticasone (FLONASE) 50 MCG/ACT nasal spray, USE 1 SPRAY IN EACH NOSTRIL TWICE DAILY (Patient taking differently: Place 2 sprays into both nostrils daily as needed for allergies or rhinitis. ), Disp: 16 g, Rfl: 1 .  meclizine (ANTIVERT) 25 MG tablet, Take 1 tablet (25 mg total) by mouth 3 (three) times daily as needed for dizziness., Disp: 30 tablet, Rfl: 0 .  Multiple Vitamin (MULTIVITAMIN WITH MINERALS) TABS tablet, Take 1 tablet by mouth daily., Disp: , Rfl:  .  promethazine (PHENERGAN) 25 MG tablet, Take 1 tablet (25 mg total) by mouth every 8 (eight) hours as needed for nausea or vomiting., Disp: 20 tablet, Rfl: 0 .  SUMAtriptan (IMITREX) 50 MG tablet, TAKE 1 TABLET BY MOUTH IMMEDIATELY, MAY  REPEAT AFTER 2 HOURS AS NEEDED (Patient taking differently: Take 50 mg by mouth every 2 (two) hours as needed for migraine. ), Disp: 10 tablet, Rfl: 5   Patient Care Team: Mar Daring, PA-C as PCP - General (Family Medicine) Bary Castilla, Forest Gleason, MD (General Surgery) Margarita Rana, MD as Referring Physician (Family Medicine) Rubye Beach as Physician Assistant (Family Medicine)    Objective:    Vitals: BP 132/74 (BP Location: Left Arm, Patient Position: Sitting, Cuff Size: Large)   Pulse 78   Temp (!) 97.3 F (36.3 C) (Temporal)   Resp 16   Ht 5\' 7"  (1.702 m)   Wt 232 lb 6.4 oz (105.4 kg)   BMI 36.40 kg/m    Vitals:   01/05/19 1420  BP:  132/74  Pulse: 78  Resp: 16  Temp: (!) 97.3 F (36.3 C)  TempSrc: Temporal  Weight: 232 lb 6.4 oz (105.4 kg)  Height: 5\' 7"  (1.702 m)     Physical Exam Vitals signs reviewed.  Constitutional:      General: She is not in acute distress.    Appearance: Normal appearance. She is well-developed. She is obese. She is not ill-appearing or diaphoretic.  HENT:     Head: Normocephalic and atraumatic.     Right Ear: Tympanic membrane, ear canal and external ear normal.     Left Ear: Tympanic membrane, ear canal and external ear normal.  Eyes:     General: No scleral icterus.       Right eye: No discharge.        Left eye: No discharge.     Extraocular Movements: Extraocular movements intact.     Conjunctiva/sclera: Conjunctivae normal.     Pupils: Pupils are equal, round, and reactive to light.  Neck:     Musculoskeletal: Normal range of motion and neck supple.     Thyroid: No thyromegaly.     Vascular: No carotid bruit or JVD.     Trachea: No tracheal deviation.  Cardiovascular:     Rate and Rhythm: Normal rate and regular rhythm.     Pulses: Normal pulses.     Heart sounds: Normal heart sounds. No murmur. No friction rub. No gallop.   Pulmonary:     Effort: Pulmonary effort is normal. No respiratory distress.      Breath sounds: Normal breath sounds. No wheezing or rales.  Chest:     Chest wall: No tenderness.  Abdominal:     General: Abdomen is flat. Bowel sounds are normal. There is no distension.     Palpations: Abdomen is soft. There is no mass.     Tenderness: There is no abdominal tenderness. There is no guarding or rebound.  Genitourinary:    Comments: Followed by GYN Musculoskeletal: Normal range of motion.        General: No tenderness.     Right lower leg: No edema.     Left lower leg: No edema.  Lymphadenopathy:     Cervical: No cervical adenopathy.  Skin:    General: Skin is warm and dry.     Capillary Refill: Capillary refill takes less than 2 seconds.     Findings: No rash.  Neurological:     General: No focal deficit present.     Mental Status: She is alert and oriented to person, place, and time. Mental status is at baseline.  Psychiatric:        Mood and Affect: Mood normal.        Behavior: Behavior normal.        Thought Content: Thought content normal.        Judgment: Judgment normal.      Depression Screen PHQ 2/9 Scores 01/05/2019 09/27/2017 11/08/2016 09/13/2016  PHQ - 2 Score 0 0 0 0  PHQ- 9 Score 3 - - -       Assessment & Plan:     Routine Health Maintenance and Physical Exam  Exercise Activities and Dietary recommendations Goals   None     Immunization History  Administered Date(s) Administered  . Influenza Split 11/15/2016  . Influenza,inj,Quad PF,6+ Mos 11/15/2016  . Tdap 04/06/2010    Health Maintenance  Topic Date Due  . INFLUENZA VACCINE  09/02/2018  . TETANUS/TDAP  04/05/2020  . PAP  SMEAR-Modifier  07/05/2021  . HIV Screening  Completed     Discussed health benefits of physical activity, and encouraged her to engage in regular exercise appropriate for her age and condition.    1. Annual physical exam Normal physical exam today. Will check labs as below and f/u pending lab results. If labs are stable and WNL she will not need  to have these rechecked for one year at her next annual physical exam. She is to call the office in the meantime if she has any acute issue, questions or concerns. - CBC w/Diff - Comprehensive Metabolic Panel (CMET) - HgB A1c - Lipid Profile - TSH  2. Class 2 severe obesity due to excess calories with serious comorbidity and body mass index (BMI) of 36.0 to 36.9 in adult [facility] [place]) Counseled patient on healthy lifestyle modifications including dieting and exercise.  - CBC w/Diff - Comprehensive Metabolic Panel (CMET) - HgB A1c - Lipid Profile  3. Lipedema Suspected to have lipedema in the lower extremities. Possible also has some venous insuff. Will refer to vascular surgery as below for further evaluation and see if patient may be a candidate or benefit from a lymphedema pump or have referrals for physical therapist that specialize in this. She agrees.  - Ambulatory referral to Vascular Surgery  4. Avitaminosis D H/O this. Will check labs as below and f/u pending results. - Vitamin D (25 hydroxy)  5. High potassium H/O this. Will check labs as below and f/u pending results. - Comprehensive Metabolic Panel (CMET)  6. 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 Will check PTH to see if possible secondary cause to why she has medical problems that are associated with hypercalcemia without having abnormal calcium labs.  - PTH, Intact and Calcium  7. Need for influenza vaccination Flu vaccine given today without complication. Patient sat upright for 15 minutes to check for adverse reaction before being released. - Flu Vaccine QUAD 36+ mos PF IM (Fluarix & Fluzone Quad PF)  --------------------------------------------------------------------    Mar Daring, PA-C  [facility]

## 2019-01-09 DIAGNOSIS — E559 Vitamin D deficiency, unspecified: Secondary | ICD-10-CM | POA: Diagnosis not present

## 2019-01-09 DIAGNOSIS — Z6836 Body mass index (BMI) 36.0-36.9, adult: Secondary | ICD-10-CM | POA: Diagnosis not present

## 2019-01-09 DIAGNOSIS — E875 Hyperkalemia: Secondary | ICD-10-CM | POA: Diagnosis not present

## 2019-01-09 DIAGNOSIS — Z0000 Encounter for general adult medical examination without abnormal findings: Secondary | ICD-10-CM | POA: Diagnosis not present

## 2019-01-10 ENCOUNTER — Telehealth: Payer: Self-pay

## 2019-01-10 DIAGNOSIS — R609 Edema, unspecified: Secondary | ICD-10-CM | POA: Insufficient documentation

## 2019-01-10 DIAGNOSIS — M2420 Disorder of ligament, unspecified site: Secondary | ICD-10-CM | POA: Insufficient documentation

## 2019-01-10 LAB — COMPREHENSIVE METABOLIC PANEL
ALT: 23 IU/L (ref 0–32)
AST: 21 IU/L (ref 0–40)
Albumin/Globulin Ratio: 1.7 (ref 1.2–2.2)
Albumin: 4 g/dL (ref 3.8–4.8)
Alkaline Phosphatase: 51 IU/L (ref 39–117)
BUN/Creatinine Ratio: 14 (ref 9–23)
BUN: 12 mg/dL (ref 6–24)
Bilirubin Total: 0.4 mg/dL (ref 0.0–1.2)
CO2: 22 mmol/L (ref 20–29)
Calcium: 8.9 mg/dL (ref 8.7–10.2)
Chloride: 107 mmol/L — ABNORMAL HIGH (ref 96–106)
Creatinine, Ser: 0.85 mg/dL (ref 0.57–1.00)
GFR calc Af Amer: 94 mL/min/{1.73_m2} (ref >59)
GFR calc non Af Amer: 82 mL/min/{1.73_m2} (ref >59)
Globulin, Total: 2.3 g/dL (ref 1.5–4.5)
Glucose: 81 mg/dL (ref 65–99)
Potassium: 4.4 mmol/L (ref 3.5–5.2)
Sodium: 141 mmol/L (ref 134–144)
Total Protein: 6.3 g/dL (ref 6.0–8.5)

## 2019-01-10 LAB — PTH, INTACT AND CALCIUM: PTH: 38 pg/mL (ref 15–65)

## 2019-01-10 LAB — CBC WITH DIFFERENTIAL/PLATELET
Basophils Absolute: 0 10*3/uL (ref 0.0–0.2)
Basos: 1 %
EOS (ABSOLUTE): 0.1 10*3/uL (ref 0.0–0.4)
Eos: 1 %
Hematocrit: 41.5 % (ref 34.0–46.6)
Hemoglobin: 13.5 g/dL (ref 11.1–15.9)
Immature Grans (Abs): 0 10*3/uL (ref 0.0–0.1)
Immature Granulocytes: 0 %
Lymphocytes Absolute: 1.4 10*3/uL (ref 0.7–3.1)
Lymphs: 40 %
MCH: 29.5 pg (ref 26.6–33.0)
MCHC: 32.5 g/dL (ref 31.5–35.7)
MCV: 91 fL (ref 79–97)
Monocytes Absolute: 0.4 10*3/uL (ref 0.1–0.9)
Monocytes: 11 %
Neutrophils Absolute: 1.7 10*3/uL (ref 1.4–7.0)
Neutrophils: 47 %
Platelets: 200 10*3/uL (ref 150–450)
RBC: 4.57 x10E6/uL (ref 3.77–5.28)
RDW: 11.9 % (ref 11.7–15.4)
WBC: 3.6 10*3/uL (ref 3.4–10.8)

## 2019-01-10 LAB — HEMOGLOBIN A1C
Est. average glucose Bld gHb Est-mCnc: 100 mg/dL
Hgb A1c MFr Bld: 5.1 % (ref 4.8–5.6)

## 2019-01-10 LAB — LIPID PANEL
Chol/HDL Ratio: 2.9 ratio (ref 0.0–4.4)
Cholesterol, Total: 155 mg/dL (ref 100–199)
HDL: 54 mg/dL (ref >39)
LDL Chol Calc (NIH): 85 mg/dL (ref 0–99)
Triglycerides: 82 mg/dL (ref 0–149)
VLDL Cholesterol Cal: 16 mg/dL (ref 5–40)

## 2019-01-10 LAB — TSH: TSH: 2.14 u[IU]/mL (ref 0.450–4.500)

## 2019-01-10 LAB — VITAMIN D 25 HYDROXY (VIT D DEFICIENCY, FRACTURES): Vit D, 25-Hydroxy: 25.6 ng/mL — ABNORMAL LOW (ref 30.0–100.0)

## 2019-01-10 NOTE — Telephone Encounter (Signed)
Patient advised as directed below. 

## 2019-01-10 NOTE — Telephone Encounter (Signed)
-----   Message from Mar Daring, PA-C sent at 01/10/2019  9:50 AM EST ----- Blood count is normal. Kidney and liver function are normal. Sodium, potassium and calcium are normal. A1c/sugar is normal. Cholesterol is normal. Thyroid is normal. Parathyroid hormone level is normal. Vit D is borderline low. Continue OTC Vit D supplement of 2000 IU daily (increase current dose).

## 2019-01-11 ENCOUNTER — Other Ambulatory Visit: Payer: Self-pay

## 2019-01-11 ENCOUNTER — Ambulatory Visit (INDEPENDENT_AMBULATORY_CARE_PROVIDER_SITE_OTHER): Payer: Federal, State, Local not specified - PPO | Admitting: Vascular Surgery

## 2019-01-11 ENCOUNTER — Encounter (INDEPENDENT_AMBULATORY_CARE_PROVIDER_SITE_OTHER): Payer: Self-pay | Admitting: Vascular Surgery

## 2019-01-11 VITALS — BP 110/79 | HR 67 | Resp 16 | Wt 231.0 lb

## 2019-01-11 DIAGNOSIS — M79605 Pain in left leg: Secondary | ICD-10-CM | POA: Diagnosis not present

## 2019-01-11 DIAGNOSIS — I890 Lymphedema, not elsewhere classified: Secondary | ICD-10-CM | POA: Diagnosis not present

## 2019-01-11 DIAGNOSIS — M79604 Pain in right leg: Secondary | ICD-10-CM

## 2019-01-11 DIAGNOSIS — R609 Edema, unspecified: Secondary | ICD-10-CM

## 2019-01-12 ENCOUNTER — Other Ambulatory Visit: Payer: Self-pay

## 2019-01-12 DIAGNOSIS — Z20822 Contact with and (suspected) exposure to covid-19: Secondary | ICD-10-CM

## 2019-01-13 LAB — NOVEL CORONAVIRUS, NAA: SARS-CoV-2, NAA: NOT DETECTED

## 2019-01-18 ENCOUNTER — Other Ambulatory Visit (INDEPENDENT_AMBULATORY_CARE_PROVIDER_SITE_OTHER): Payer: Self-pay | Admitting: Nurse Practitioner

## 2019-01-18 DIAGNOSIS — R609 Edema, unspecified: Secondary | ICD-10-CM

## 2019-01-19 ENCOUNTER — Other Ambulatory Visit: Payer: Self-pay

## 2019-01-19 ENCOUNTER — Ambulatory Visit (INDEPENDENT_AMBULATORY_CARE_PROVIDER_SITE_OTHER): Payer: Federal, State, Local not specified - PPO | Admitting: Nurse Practitioner

## 2019-01-19 ENCOUNTER — Encounter (INDEPENDENT_AMBULATORY_CARE_PROVIDER_SITE_OTHER): Payer: Self-pay | Admitting: Nurse Practitioner

## 2019-01-19 ENCOUNTER — Ambulatory Visit (INDEPENDENT_AMBULATORY_CARE_PROVIDER_SITE_OTHER): Payer: Federal, State, Local not specified - PPO

## 2019-01-19 VITALS — BP 125/83 | HR 74 | Resp 17 | Ht 67.0 in | Wt 234.0 lb

## 2019-01-19 DIAGNOSIS — R609 Edema, unspecified: Secondary | ICD-10-CM

## 2019-01-19 DIAGNOSIS — I83892 Varicose veins of left lower extremities with other complications: Secondary | ICD-10-CM

## 2019-01-19 DIAGNOSIS — I890 Lymphedema, not elsewhere classified: Secondary | ICD-10-CM

## 2019-01-20 ENCOUNTER — Encounter (INDEPENDENT_AMBULATORY_CARE_PROVIDER_SITE_OTHER): Payer: Self-pay | Admitting: Nurse Practitioner

## 2019-01-20 DIAGNOSIS — I890 Lymphedema, not elsewhere classified: Secondary | ICD-10-CM | POA: Insufficient documentation

## 2019-01-20 NOTE — Progress Notes (Signed)
SUBJECTIVE:  Patient ID: Norma Wall, female    DOB: 1970-03-16, 48 y.o.   MRN: [id number] Chief Complaint  Patient presents with  . Follow-up    ultrasound    HPI  Norma Wall is a 48 y.o. female presents today to follow-up for some noninvasive testing.  The patient is suspected to have lipedema in the bilateral lower extremities.  The patient also has swelling in the bilateral lower extremities as well as some scattered varicosities.  There is also concerned about 2 nodules on the left lower extremity that appeared after an accident and some bruising.  She denies any open wounds.  Today noninvasive studies show no evidence of DVT bilaterally.  No evidence of superficial venous thrombosis bilaterally.  The left lower extremity has reflux in the left great saphenous vein at the proximal thigh.  The diameter is 0.74 cm.  There are also 2 nodules seen on the left shin knee and the distal calf areas.  Past Medical History:  Diagnosis Date  . Abdominal pain, right upper quadrant 2013  . Anxiety   . Endometriosis 01/2013  . Gallbladder polyp   . Ovarian cyst 2014   right ovary    Past Surgical History:  Procedure Laterality Date  . ABLATION  2012   cyst removal  . CHOLECYSTECTOMY N/A 10/19/2018   Procedure: LAPAROSCOPIC CHOLECYSTECTOMY;  Surgeon: Jules Husbands, MD;  Location: [facility] ORS;  Service: General;  Laterality: N/A;  . LAPAROSCOPIC OVARIAN CYSTECTOMY Right 10/19/2018   Procedure: LAPAROSCOPIC RIGHT OVARIAN CYSTECTOMY;  Surgeon: Malachy Mood, MD;  Location: [facility] OR[facility];  Service: Gynecology;  Laterality: Right;  . NECK SURGERY  02/22/2018   Artificial Disc replacement   . OVARIAN CYST REMOVAL      Social History   Socioeconomic History  . Marital status: Married    Spouse name: Not on file  . Number of children: Not on file  . Years of education: Not on file  . Highest education level: Not on file  Occupational History  . Not on file  Tobacco  Use  . Smoking status: Former Smoker    Types: Cigarettes    Quit date: 02/01/1997    Years since quitting: 21.9  . Smokeless tobacco: Never Used  Substance and Sexual Activity  . Alcohol use: Yes    Comment: occassional   . Drug use: No  . Sexual activity: Yes    Birth control/protection: None  Other Topics Concern  . Not on file  Social History Narrative  . Not on file   Social Determinants of Health   Financial Resource Strain:   . Difficulty of Paying Living Expenses: Not on file  Food Insecurity:   . Worried About Charity fundraiser in the Last Year: Not on file  . Ran Out of Food in the Last Year: Not on file  Transportation Needs:   . Lack of Transportation (Medical): Not on file  . Lack of Transportation (Non-Medical): Not on file  Physical Activity:   . Days of Exercise per Week: Not on file  . Minutes of Exercise per Session: Not on file  Stress:   . Feeling of Stress : Not on file  Social Connections:   . Frequency of Communication with Friends and Family: Not on file  . Frequency of Social Gatherings with Friends and Family: Not on file  . Attends Religious Services: Not on file  . Active Member of Clubs or Organizations: Not on file  . Attends  Club or Organization Meetings: Not on file  . Marital Status: Not on file  Intimate Partner Violence:   . Fear of Current or Ex-Partner: Not on file  . Emotionally Abused: Not on file  . Physically Abused: Not on file  . Sexually Abused: Not on file    Family History  Problem Relation Age of Onset  . Lung cancer Father   . Diabetes Mother   . Stroke Mother   . Diabetes Maternal Grandmother   . Colon cancer Neg Hx   . Breast cancer Neg Hx     No Known Allergies   Review of Systems   Review of Systems: Negative Unless Checked Constitutional: [] Weight loss  [] Fever  [] Chills Cardiac: [] Chest pain   []  Atrial Fibrillation  [] Palpitations   [] Shortness of breath when laying flat   [] Shortness of breath with  exertion. [] Shortness of breath at rest Vascular:  [] Pain in legs with walking   [] Pain in legs with standing [] Pain in legs when laying flat   [] Claudication    [] Pain in feet when laying flat    [] History of DVT   [] Phlebitis   [x] Swelling in legs   [x] Varicose veins   [] Non-healing ulcers Pulmonary:   [] Uses home oxygen   [] Productive cough   [] Hemoptysis   [] Wheeze  [] COPD   [] Asthma Neurologic:  [] Dizziness   [] Seizures  [] Blackouts [] History of stroke   [] History of TIA  [] Aphasia   [] Temporary Blindness   [] Weakness or numbness in arm   [] Weakness or numbness in leg Musculoskeletal:   [] Joint swelling   [] Joint pain   [] Low back pain  []  History of Knee Replacement [] Arthritis [] back Surgeries  []  Spinal Stenosis    Hematologic:  [] Easy bruising  [] Easy bleeding   [] Hypercoagulable state   [] Anemic Gastrointestinal:  [] Diarrhea   [] Vomiting  [] Gastroesophageal reflux/heartburn   [] Difficulty swallowing. [] Abdominal pain Genitourinary:  [] Chronic kidney disease   [] Difficult urination  [] Anuric   [] Blood in urine [] Frequent urination  [] Burning with urination   [] Hematuria Skin:  [] Rashes   [] Ulcers [] Wounds Psychological:  [x] History of anxiety   []  History of major depression  []  Memory Difficulties      OBJECTIVE:   Physical Exam  BP 125/83 (BP Location: Right Arm)   Pulse 74   Resp 17   Ht 5\' 7"  (1.702 m)   Wt 234 lb (106.1 kg)   BMI 36.65 kg/m   Gen: WD/WN, NAD Head: [place]/AT, No temporalis wasting.  Ear/Nose/Throat: Hearing grossly intact, nares w/o erythema or drainage Eyes: PER, EOMI, sclera nonicteric.  Neck: Supple, no masses.  No JVD.  Pulmonary:  Good air movement, no use of accessory muscles.  Cardiac: RRR Vascular:  Scattered varicosities bilaterally, 1+ soft edema with mild stasis dermatitis Vessel Right Left  Radial Palpable Palpable   Gastrointestinal: soft, non-distended. No guarding/no peritoneal signs.  Musculoskeletal: M/S 5/5 throughout.  No deformity or  atrophy.  Neurologic: Pain and light touch intact in extremities.  Symmetrical.  Speech is fluent. Motor exam as listed above. Psychiatric: Judgment intact, Mood & affect appropriate for pt's clinical situation. Dermatologic:  No Ulcers Noted.  No changes consistent with cellulitis. Lymph : No Cervical lymphadenopathy       ASSESSMENT AND PLAN:  1. Varicose veins of left lower extremity with other complications  Recommend:  The patient has large symptomatic varicose veins that are painful and associated with swelling.  I have had a long discussion with the patient regarding  varicose veins  and why they cause symptoms.  Patient will begin wearing graduated compression stockings class 1 on a daily basis, beginning first thing in the morning and removing them in the evening. The patient is instructed specifically not to sleep in the stockings.    The patient  will also begin using over-the-counter analgesics such as Motrin 600 mg po TID to help control the symptoms.    In addition, behavioral modification including elevation during the day will be initiated.    Pending the results of these changes the  patient will be reevaluated in three months.     Further plans will be based on the ultrasound results and whether conservative therapies are successful at eliminating the pain and swelling.   2. Lipedema Many of the conservative therapies used for lymphedema are also helpful for leg edema.  This includes medical grade 1 compression stockings, elevation as well as at least 30 minutes of exercise on a daily basis.  Additionally, utilization of a lymphedema pump can also be helpful in treatment of lipedema.  In addition, manual massage may be useful.  After discussion with the patient we will proceed with a 77-month Of conservative therapy to determine if this is effective.  However if it is not we can consider other therapies including referral for lymphatic massage or lymph pump therapy.  3.  Lymphedema Patient will continue to follow the conservative therapy as outlined above.  Depending upon the effectiveness of conservative therapy, we may consider a lymphedema pump for patient for additional therapy.   Current Outpatient Medications on File Prior to Visit  Medication Sig Dispense Refill  . cholecalciferol (VITAMIN D3) 25 MCG (1000 UT) tablet Take 1,000 Units by mouth daily.    . clonazePAM (KLONOPIN) 0.5 MG tablet TAKE 1 TABLET BY MOUTH TWICE DAILY AS NEEDED FOR ANXIETY OR PALPITATIONS (Patient taking differently: Take 0.25-0.5 mg by mouth 2 (two) times daily as needed for anxiety. ) 60 tablet 5  . fluticasone (FLONASE) 50 MCG/ACT nasal spray USE 1 SPRAY IN EACH NOSTRIL TWICE DAILY (Patient taking differently: Place 2 sprays into both nostrils daily as needed for allergies or rhinitis. ) 16 g 1  . meclizine (ANTIVERT) 25 MG tablet Take 1 tablet (25 mg total) by mouth 3 (three) times daily as needed for dizziness. 30 tablet 0  . Multiple Vitamin (MULTIVITAMIN WITH MINERALS) TABS tablet Take 1 tablet by mouth daily.    . promethazine (PHENERGAN) 25 MG tablet Take 1 tablet (25 mg total) by mouth every 8 (eight) hours as needed for nausea or vomiting. 20 tablet 0  . SUMAtriptan (IMITREX) 50 MG tablet TAKE 1 TABLET BY MOUTH IMMEDIATELY, MAY REPEAT AFTER 2 HOURS AS NEEDED (Patient taking differently: Take 50 mg by mouth every 2 (two) hours as needed for migraine. ) 10 tablet 5   No current facility-administered medications on file prior to visit.    There are no Patient Instructions on file for this visit. No follow-ups on file.   Kris Hartmann, NP  This note was completed with Sales executive.  Any errors are purely unintentional.

## 2019-01-31 ENCOUNTER — Encounter (INDEPENDENT_AMBULATORY_CARE_PROVIDER_SITE_OTHER): Payer: Self-pay | Admitting: Vascular Surgery

## 2019-01-31 DIAGNOSIS — M79606 Pain in leg, unspecified: Secondary | ICD-10-CM | POA: Insufficient documentation

## 2019-01-31 NOTE — Progress Notes (Signed)
MRN : [id number]  Norma Wall is a 48 y.o. (1970/07/26) female who presents with chief complaint of  Chief Complaint  Patient presents with  . New Patient (Initial Visit)    ref [place] lipedema  .  History of Present Illness:   Patient is seen for evaluation of leg pain and leg swelling. The patient first noticed the swelling remotely. The swelling is associated with pain and discoloration. The pain and swelling worsens with prolonged dependency and improves with elevation. The pain is unrelated to activity.  The patient notes that in the morning the legs are significantly improved but they steadily worsened throughout the course of the day. The patient also notes a steady worsening of the discoloration in the ankle and shin area.   The patient denies claudication symptoms.  The patient denies symptoms consistent with rest pain.  The patient denies and extensive history of DJD and LS spine disease.  The patient has no had any past angiography, interventions or vascular surgery.  Elevation makes the leg symptoms better, dependency makes them much worse. There is no history of ulcerations. The patient denies any recent changes in medications.  The patient has not been wearing graduated compression.  The patient denies a history of DVT or PE. There is no prior history of phlebitis. There is no history of primary lymphedema.  No history of malignancies. No history of trauma or groin or pelvic surgery. There is no history of radiation treatment to the groin or pelvis  The patient denies amaurosis fugax or recent TIA symptoms. There are no recent neurological changes noted. The patient denies recent episodes of angina or shortness of breath  Current Meds  Medication Sig  . cholecalciferol (VITAMIN D3) 25 MCG (1000 UT) tablet Take 1,000 Units by mouth daily.  . clonazePAM (KLONOPIN) 0.5 MG tablet TAKE 1 TABLET BY MOUTH TWICE DAILY AS NEEDED FOR ANXIETY OR PALPITATIONS  (Patient taking differently: Take 0.25-0.5 mg by mouth 2 (two) times daily as needed for anxiety. )  . fluticasone (FLONASE) 50 MCG/ACT nasal spray USE 1 SPRAY IN EACH NOSTRIL TWICE DAILY (Patient taking differently: Place 2 sprays into both nostrils daily as needed for allergies or rhinitis. )  . meclizine (ANTIVERT) 25 MG tablet Take 1 tablet (25 mg total) by mouth 3 (three) times daily as needed for dizziness.  . Multiple Vitamin (MULTIVITAMIN WITH MINERALS) TABS tablet Take 1 tablet by mouth daily.  . promethazine (PHENERGAN) 25 MG tablet Take 1 tablet (25 mg total) by mouth every 8 (eight) hours as needed for nausea or vomiting.  . SUMAtriptan (IMITREX) 50 MG tablet TAKE 1 TABLET BY MOUTH IMMEDIATELY, MAY REPEAT AFTER 2 HOURS AS NEEDED (Patient taking differently: Take 50 mg by mouth every 2 (two) hours as needed for migraine. )    Past Medical History:  Diagnosis Date  . Abdominal pain, right upper quadrant 2013  . Anxiety   . Endometriosis 01/2013  . Gallbladder polyp   . Ovarian cyst 2014   right ovary    Past Surgical History:  Procedure Laterality Date  . ABLATION  2012   cyst removal  . CHOLECYSTECTOMY N/A 10/19/2018   Procedure: LAPAROSCOPIC CHOLECYSTECTOMY;  Surgeon: Jules Husbands, MD;  Location: [facility] ORS;  Service: General;  Laterality: N/A;  . LAPAROSCOPIC OVARIAN CYSTECTOMY Right 10/19/2018   Procedure: LAPAROSCOPIC RIGHT OVARIAN CYSTECTOMY;  Surgeon: Malachy Mood, MD;  Location: [facility] OR[facility];  Service: Gynecology;  Laterality: Right;  . NECK SURGERY  02/22/2018  Artificial Disc replacement   . OVARIAN CYST REMOVAL      Social History Social History   Tobacco Use  . Smoking status: Former Smoker    Types: Cigarettes    Quit date: 02/01/1997    Years since quitting: 22.0  . Smokeless tobacco: Never Used  Substance Use Topics  . Alcohol use: Yes    Comment: occassional   . Drug use: No    Family History Family History  Problem Relation Age of Onset  .  Lung cancer Father   . Diabetes Mother   . Stroke Mother   . Diabetes Maternal Grandmother   . Colon cancer Neg Hx   . Breast cancer Neg Hx   No family history of bleeding/clotting disorders, porphyria or autoimmune disease   No Known Allergies   REVIEW OF SYSTEMS (Negative unless checked)  Constitutional: [] Weight loss  [] Fever  [] Chills Cardiac: [] Chest pain   [] Chest pressure   [] Palpitations   [] Shortness of breath when laying flat   [] Shortness of breath with exertion. Vascular:  [] Pain in legs with walking   [x] Pain in legs at rest  [] History of DVT   [] Phlebitis   [x] Swelling in legs   [] Varicose veins   [] Non-healing ulcers Pulmonary:   [] Uses home oxygen   [] Productive cough   [] Hemoptysis   [] Wheeze  [] COPD   [] Asthma Neurologic:  [] Dizziness   [] Seizures   [] History of stroke   [] History of TIA  [] Aphasia   [] Vissual changes   [] Weakness or numbness in arm   [] Weakness or numbness in leg Musculoskeletal:   [] Joint swelling   [x] Joint pain   [] Low back pain Hematologic:  [] Easy bruising  [] Easy bleeding   [] Hypercoagulable state   [] Anemic Gastrointestinal:  [] Diarrhea   [] Vomiting  [] Gastroesophageal reflux/heartburn   [] Difficulty swallowing. Genitourinary:  [] Chronic kidney disease   [] Difficult urination  [] Frequent urination   [] Blood in urine Skin:  [] Rashes   [] Ulcers  Psychological:  [] History of anxiety   []  History of major depression.  Physical Examination  Vitals:   01/11/19 1516  BP: 110/79  Pulse: 67  Resp: 16  Weight: 231 lb (104.8 kg)   Body mass index is 36.18 kg/m. Gen: WD/WN, NAD Head: [place]/AT, No temporalis wasting.  Ear/Nose/Throat: Hearing grossly intact, nares w/o erythema or drainage, poor dentition Eyes: PER, EOMI, sclera nonicteric.  Neck: Supple, no masses.  No bruit or JVD.  Pulmonary:  Good air movement, clear to auscultation bilaterally, no use of accessory muscles.  Cardiac: RRR, normal S1, S2, no Murmurs. Vascular: scattered  varicosities present bilaterally.  Moderate venous stasis changes to the legs bilaterally.  3-4+ hard minimally pitting edema Vessel Right Left  PT Palpable Palpable  DP Palpable Palpable  Gastrointestinal: soft, non-distended. No guarding/no peritoneal signs.  Musculoskeletal: M/S 5/5 throughout.  No deformity or atrophy.  Neurologic: CN 2-12 intact. Pain and light touch intact in extremities.  Symmetrical.  Speech is fluent. Motor exam as listed above. Psychiatric: Judgment intact, Mood & affect appropriate for pt's clinical situation. Dermatologic: Venous rashes no ulcers noted.  No changes consistent with cellulitis. Lymph : No Cervical lymphadenopathy, no lichenification or skin changes of chronic lymphedema.  CBC Lab Results  Component Value Date   WBC 3.6 01/09/2019   HGB 13.5 01/09/2019   HCT 41.5 01/09/2019   MCV 91 01/09/2019   PLT 200 01/09/2019    BMET    Component Value Date/Time   NA 141 01/09/2019 0859   NA 139 01/09/2013  1613   K 4.4 01/09/2019 0859   K 3.8 01/09/2013 1613   CL 107 (H) 01/09/2019 0859   CL 108 (H) 01/09/2013 1613   CO2 22 01/09/2019 0859   CO2 27 01/09/2013 1613   GLUCOSE 81 01/09/2019 0859   GLUCOSE 112 (H) 08/30/2018 1701   GLUCOSE 88 01/09/2013 1613   BUN 12 01/09/2019 0859   BUN 15 01/09/2013 1613   CREATININE 0.85 01/09/2019 0859   CREATININE 0.64 01/09/2013 1613   CALCIUM 8.9 01/09/2019 0859   CALCIUM 8.7 01/09/2013 1613   GFRNONAA 82 01/09/2019 0859   GFRNONAA >60 01/09/2013 1613   GFRAA 94 01/09/2019 0859   GFRAA >60 01/09/2013 1613   CrCl cannot be calculated (Patient's most recent lab result is older than the maximum 21 days allowed.).  COAG No results found for: INR, PROTIME  Radiology VAS [place] LOWER EXTREMITY VENOUS REFLUX  Result Date: 01/22/2019  Lower Venous Reflux Study Indications: Edema.  Performing Technologist: Almira Coaster RVS  Examination Guidelines: A complete evaluation includes B-mode imaging, spectral  Doppler, color Doppler, and power Doppler as needed of all accessible portions of each vessel. Bilateral testing is considered an integral part of a complete examination. Limited examinations for reoccurring indications may be performed as noted. The reflux portion of the exam is performed with the patient in reverse Trendelenburg.  +---------+---------------+---------+-----------+----------+--------------+ RIGHT    CompressibilityPhasicitySpontaneityPropertiesThrombus Aging +---------+---------------+---------+-----------+----------+--------------+ CFV      Full           Yes      Yes                                 +---------+---------------+---------+-----------+----------+--------------+ SFJ      Full           Yes      Yes                                 +---------+---------------+---------+-----------+----------+--------------+ FV Prox  Full           Yes      Yes                                 +---------+---------------+---------+-----------+----------+--------------+ FV Mid   Full           Yes      Yes                                 +---------+---------------+---------+-----------+----------+--------------+ FV DistalFull           Yes      Yes                                 +---------+---------------+---------+-----------+----------+--------------+ PFV      Full           Yes      Yes                                 +---------+---------------+---------+-----------+----------+--------------+ POP      Full           Yes      Yes                                 +---------+---------------+---------+-----------+----------+--------------+  GSV      Full           Yes      Yes                                 +---------+---------------+---------+-----------+----------+--------------+ SSV      Full           Yes      Yes                                 +---------+---------------+---------+-----------+----------+--------------+    +---------+---------------+---------+-----------+----------+--------------+ LEFT     CompressibilityPhasicitySpontaneityPropertiesThrombus Aging +---------+---------------+---------+-----------+----------+--------------+ CFV      Full           Yes      Yes                                 +---------+---------------+---------+-----------+----------+--------------+ SFJ      Full           Yes      Yes                                 +---------+---------------+---------+-----------+----------+--------------+ FV Prox  Full           Yes      Yes                                 +---------+---------------+---------+-----------+----------+--------------+ FV Mid   Full           Yes      Yes                                 +---------+---------------+---------+-----------+----------+--------------+ FV DistalFull           Yes      Yes                                 +---------+---------------+---------+-----------+----------+--------------+ PFV      Full           Yes      Yes                                 +---------+---------------+---------+-----------+----------+--------------+ POP      Full           Yes      Yes                                 +---------+---------------+---------+-----------+----------+--------------+ GSV      Full           Yes      Yes                                 +---------+---------------+---------+-----------+----------+--------------+ SSV      Full           Yes      Yes                                 +---------+---------------+---------+-----------+----------+--------------+  Venous Reflux Times +--------------+------+-----------+------------+--------+ RIGHT         RefluxReflux TimeDiameter cmsComments                Yes                                  +--------------+------+-----------+------------+--------+ GSV at SFJ                         .62               +--------------+------+-----------+------------+--------+ GSV prox thigh                     .70              +--------------+------+-----------+------------+--------+ GSV mid thigh                      .48              +--------------+------+-----------+------------+--------+ GSV dist thigh                     .59              +--------------+------+-----------+------------+--------+ GSV at knee                        .52              +--------------+------+-----------+------------+--------+ GSV prox calf                      .39              +--------------+------+-----------+------------+--------+ SSV Pop Fossa                      .85              +--------------+------+-----------+------------+--------+  +--------------+------+-----------+------------+--------+ LEFT          RefluxReflux TimeDiameter cmsComments                Yes                                  +--------------+------+-----------+------------+--------+ GSV at SFJ                         .79              +--------------+------+-----------+------------+--------+ GSV prox thigh yes    3440 ms      .74              +--------------+------+-----------+------------+--------+ GSV mid thigh                      .64              +--------------+------+-----------+------------+--------+ GSV dist thigh                     .60              +--------------+------+-----------+------------+--------+ GSV at knee                        .44              +--------------+------+-----------+------------+--------+ GSV prox calf                      .  37              +--------------+------+-----------+------------+--------+   Summary: Right: There is no evidence of deep vein thrombosis in the lower extremity.There is no evidence of superficial venous thrombosis.There is no evidence of chronic venous insufficiency. Left: Evidence of chronic venous insufficiency is detected in the great  saphenous vein. There is no evidence of deep vein thrombosis in the lower extremity.There is no evidence of superficial venous thrombosis. Nodules seen on the Left Shin Mid and Distal Calf area.  *See table(s) above for measurements and observations. Electronically signed by Hortencia Pilar MD on 01/22/2019 at 5:26:02 PM.    Final      Assessment/Plan 1. Pain in both lower extremities I have had a long discussion with the patient regarding swelling and why it  causes symptoms.  Patient will begin wearing graduated compression stockings class 1 (20-30 mmHg) on a daily basis a prescription was given. The patient will  beginning wearing the stockings first thing in the morning and removing them in the evening. The patient is instructed specifically not to sleep in the stockings.   In addition, behavioral modification will be initiated.  This will include frequent elevation, use of over the counter pain medications and exercise such as walking.  I have reviewed systemic causes for chronic edema such as liver, kidney and cardiac etiologies.  The patient denies problems with these organ systems.    Consideration for a lymph pump will also be made based upon the effectiveness of conservative therapy.  This would help to improve the edema control and prevent sequela such as ulcers and infections   Patient should undergo duplex ultrasound of the venous system to ensure that DVT or reflux is not present.  The patient will follow-up with me after the ultrasound.   - VENOUS REFLUX; Future  2. Lymphedema I have had a long discussion with the patient regarding swelling and why it  causes symptoms.  Patient will begin wearing graduated compression stockings class 1 (20-30 mmHg) on a daily basis a prescription was given. The patient will  beginning wearing the stockings first thing in the morning and removing them in the evening. The patient is instructed specifically not to sleep in the stockings.   In  addition, behavioral modification will be initiated.  This will include frequent elevation, use of over the counter pain medications and exercise such as walking.  I have reviewed systemic causes for chronic edema such as liver, kidney and cardiac etiologies.  The patient denies problems with these organ systems.    Consideration for a lymph pump will also be made based upon the effectiveness of conservative therapy.  This would help to improve the edema control and prevent sequela such as ulcers and infections   Patient should undergo duplex ultrasound of the venous system to ensure that DVT or reflux is not present.  The patient will follow-up with me after the ultrasound.    3. Lipedema We will focus on maximizing the treatment of the lymphedema.      Hortencia Pilar, MD  01/31/2019 3:33 PM

## 2019-02-02 DIAGNOSIS — U071 COVID-19: Secondary | ICD-10-CM

## 2019-02-02 HISTORY — DX: COVID-19: U07.1

## 2019-02-09 ENCOUNTER — Ambulatory Visit: Payer: Federal, State, Local not specified - PPO | Attending: Internal Medicine

## 2019-02-09 ENCOUNTER — Other Ambulatory Visit: Payer: Self-pay | Admitting: Physician Assistant

## 2019-02-09 DIAGNOSIS — G43709 Chronic migraine without aura, not intractable, without status migrainosus: Secondary | ICD-10-CM

## 2019-02-09 DIAGNOSIS — Z20822 Contact with and (suspected) exposure to covid-19: Secondary | ICD-10-CM

## 2019-02-11 LAB — NOVEL CORONAVIRUS, NAA: SARS-CoV-2, NAA: NOT DETECTED

## 2019-03-06 ENCOUNTER — Encounter: Payer: Self-pay | Admitting: Physician Assistant

## 2019-03-08 ENCOUNTER — Ambulatory Visit: Payer: Federal, State, Local not specified - PPO | Attending: Internal Medicine

## 2019-03-08 DIAGNOSIS — Z20822 Contact with and (suspected) exposure to covid-19: Secondary | ICD-10-CM

## 2019-03-09 LAB — NOVEL CORONAVIRUS, NAA: SARS-CoV-2, NAA: NOT DETECTED

## 2019-03-09 LAB — SPECIMEN STATUS REPORT

## 2019-04-16 ENCOUNTER — Other Ambulatory Visit: Payer: Self-pay | Admitting: Physician Assistant

## 2019-04-16 DIAGNOSIS — F411 Generalized anxiety disorder: Secondary | ICD-10-CM

## 2019-04-16 NOTE — Telephone Encounter (Signed)
Requested medication (s) are due for refill today: yes  Requested medication (s) are on the active medication list: yes  Last refill:  10/25/17  Future visit scheduled: no  Notes to clinic:  medication not delegated to NT to refill   Requested Prescriptions  Pending Prescriptions Disp Refills   clonazePAM (KLONOPIN) 0.5 MG tablet [Pharmacy Med Name: CLONAZEPAM 0.5MG  TABLETS] 60 tablet     Sig: TAKE 1 TABLET BY MOUTH TWICE DAILY AS NEEDED FOR ANXIETY OR PALPITATIONS      Not Delegated - Psychiatry:  Anxiolytics/Hypnotics Failed - 04/16/2019  3:20 PM      Failed - This refill cannot be delegated      Failed - Urine Drug Screen completed in last 360 days.      Failed - Valid encounter within last 6 months    Recent Outpatient Visits           3 months ago Annual physical exam   [place], [place]   7 months ago Blepharitis of right upper eyelid, unspecified type   [place], [place], [place]   1 year ago Elevated liver enzymes   [facility] [place], [place], [place]   1 year ago Annual physical exam   [place], [place]   1 year ago Impingement syndrome of left shoulder   [facility] [place], [place], [place]

## 2019-04-16 NOTE — Telephone Encounter (Signed)
LOV 01/05/19 CPE LRF 10/25/17  # 60 + 5

## 2019-04-19 ENCOUNTER — Encounter (INDEPENDENT_AMBULATORY_CARE_PROVIDER_SITE_OTHER): Payer: Self-pay | Admitting: Vascular Surgery

## 2019-04-19 ENCOUNTER — Other Ambulatory Visit: Payer: Self-pay

## 2019-04-19 ENCOUNTER — Ambulatory Visit (INDEPENDENT_AMBULATORY_CARE_PROVIDER_SITE_OTHER): Payer: Federal, State, Local not specified - PPO | Admitting: Vascular Surgery

## 2019-04-19 VITALS — BP 111/77 | HR 76 | Resp 16 | Wt 233.8 lb

## 2019-04-19 DIAGNOSIS — I890 Lymphedema, not elsewhere classified: Secondary | ICD-10-CM

## 2019-04-19 DIAGNOSIS — R609 Edema, unspecified: Secondary | ICD-10-CM | POA: Diagnosis not present

## 2019-04-19 DIAGNOSIS — M79604 Pain in right leg: Secondary | ICD-10-CM | POA: Diagnosis not present

## 2019-04-19 DIAGNOSIS — M79605 Pain in left leg: Secondary | ICD-10-CM | POA: Diagnosis not present

## 2019-04-19 NOTE — Progress Notes (Signed)
MRN : [id number]  Norma Wall is a 49 y.o. (03-04-1970) female who presents with chief complaint of  Chief Complaint  Patient presents with  . Follow-up    27month follow up  .  History of Present Illness:   The patient returns to the office for followup evaluation regarding leg swelling.  The swelling has persisted and the pain associated with swelling continues. There have not been any interval development of a ulcerations or wounds.  Since the previous visit the patient has been wearing graduated compression stockings and has noted little if any improvement in the lymphedema. The patient has been using compression routinely morning until night.  The patient also states elevation during the day and exercise is being done too.  Duplex ultrasound of the venous system bilaterally demonstrates patent deep venous system.  No significant deep venous insufficiency noted bilaterally.  On the right the great saphenous vein is competent.  On the left the great saphenous vein demonstrates venous insufficiency in the thigh.  Subcutaneous nodules are again noted in the calf area of the left leg   Current Meds  Medication Sig  . cholecalciferol (VITAMIN D3) 25 MCG (1000 UT) tablet Take 1,000 Units by mouth daily.  . clonazePAM (KLONOPIN) 0.5 MG tablet TAKE 1 TABLET BY MOUTH TWICE DAILY AS NEEDED FOR ANXIETY OR PALPITATIONS  . fluticasone (FLONASE) 50 MCG/ACT nasal spray USE 1 SPRAY IN EACH NOSTRIL TWICE DAILY (Patient taking differently: Place 2 sprays into both nostrils daily as needed for allergies or rhinitis. )  . meclizine (ANTIVERT) 25 MG tablet Take 1 tablet (25 mg total) by mouth 3 (three) times daily as needed for dizziness.  . Multiple Vitamin (MULTIVITAMIN WITH MINERALS) TABS tablet Take 1 tablet by mouth daily.  . promethazine (PHENERGAN) 25 MG tablet Take 1 tablet (25 mg total) by mouth every 8 (eight) hours as needed for nausea or vomiting.  . SUMAtriptan (IMITREX) 50 MG  tablet TAKE 1 TABLET BY MOUTH IMMEDIATELY, MAY REPEAT AFTER 2 HOURS AS NEEDED    Past Medical History:  Diagnosis Date  . Abdominal pain, right upper quadrant 2013  . Anxiety   . Endometriosis 01/2013  . Gallbladder polyp   . Ovarian cyst 2014   right ovary    Past Surgical History:  Procedure Laterality Date  . ABLATION  2012   cyst removal  . CHOLECYSTECTOMY N/A 10/19/2018   Procedure: LAPAROSCOPIC CHOLECYSTECTOMY;  Surgeon: Jules Husbands, MD;  Location: [facility] ORS;  Service: General;  Laterality: N/A;  . LAPAROSCOPIC OVARIAN CYSTECTOMY Right 10/19/2018   Procedure: LAPAROSCOPIC RIGHT OVARIAN CYSTECTOMY;  Surgeon: Malachy Mood, MD;  Location: [facility] ORS;  Service: Gynecology;  Laterality: Right;  . NECK SURGERY  02/22/2018   Artificial Disc replacement   . OVARIAN CYST REMOVAL      Social History Social History   Tobacco Use  . Smoking status: Former Smoker    Types: Cigarettes    Quit date: 02/01/1997    Years since quitting: 22.2  . Smokeless tobacco: Never Used  Substance Use Topics  . Alcohol use: Yes    Comment: occassional   . Drug use: No    Family History Family History  Problem Relation Age of Onset  . Lung cancer Father   . Diabetes Mother   . Stroke Mother   . Diabetes Maternal Grandmother   . Colon cancer Neg Hx   . Breast cancer Neg Hx     No Known Allergies   REVIEW  OF SYSTEMS (Negative unless checked)  Constitutional: [] Weight loss  [] Fever  [] Chills Cardiac: [] Chest pain   [] Chest pressure   [] Palpitations   [] Shortness of breath when laying flat   [] Shortness of breath with exertion. Vascular:  [] Pain in legs with walking   [x] Pain in legs at rest  [] History of DVT   [] Phlebitis   [x] Swelling in legs   [] Varicose veins   [] Non-healing ulcers Pulmonary:   [] Uses home oxygen   [] Productive cough   [] Hemoptysis   [] Wheeze  [] COPD   [] Asthma Neurologic:  [] Dizziness   [] Seizures   [] History of stroke   [] History of TIA  [] Aphasia   [] Vissual  changes   [] Weakness or numbness in arm   [] Weakness or numbness in leg Musculoskeletal:   [] Joint swelling   [] Joint pain   [] Low back pain Hematologic:  [] Easy bruising  [] Easy bleeding   [] Hypercoagulable state   [] Anemic Gastrointestinal:  [] Diarrhea   [] Vomiting  [] Gastroesophageal reflux/heartburn   [] Difficulty swallowing. Genitourinary:  [] Chronic kidney disease   [] Difficult urination  [] Frequent urination   [] Blood in urine Skin:  [] Rashes   [] Ulcers  Psychological:  [] History of anxiety   []  History of major depression.  Physical Examination  Vitals:   04/19/19 1338  BP: 111/77  Pulse: 76  Resp: 16  Weight: 233 lb 12.8 oz (106.1 kg)   Body mass index is 36.62 kg/m. Gen: WD/WN, NAD Head: [place]/AT, No temporalis wasting.  Ear/Nose/Throat: Hearing grossly intact, nares w/o erythema or drainage Eyes: PER, EOMI, sclera nonicteric.  Neck: Supple, no large masses.   Pulmonary:  Good air movement, no audible wheezing bilaterally, no use of accessory muscles.  Cardiac: RRR, no JVD Vascular: scattered varicosities present bilaterally.  Mild venous stasis changes to the legs bilaterally.  3+ soft pitting edema Vessel Right Left  Radial Palpable Palpable  Gastrointestinal: Non-distended. No guarding/no peritoneal signs.  Musculoskeletal: M/S 5/5 throughout.  No deformity or atrophy.  Neurologic: CN 2-12 intact. Symmetrical.  Speech is fluent. Motor exam as listed above. Psychiatric: Judgment intact, Mood & affect appropriate for pt's clinical situation. Dermatologic: No rashes or ulcers noted.  No changes consistent with cellulitis.  CBC Lab Results  Component Value Date   WBC 3.6 01/09/2019   HGB 13.5 01/09/2019   HCT 41.5 01/09/2019   MCV 91 01/09/2019   PLT 200 01/09/2019    BMET    Component Value Date/Time   NA 141 01/09/2019 0859   NA 139 01/09/2013 1613   K 4.4 01/09/2019 0859   K 3.8 01/09/2013 1613   CL 107 (H) 01/09/2019 0859   CL 108 (H) 01/09/2013 1613    CO2 22 01/09/2019 0859   CO2 27 01/09/2013 1613   GLUCOSE 81 01/09/2019 0859   GLUCOSE 112 (H) 08/30/2018 1701   GLUCOSE 88 01/09/2013 1613   BUN 12 01/09/2019 0859   BUN 15 01/09/2013 1613   CREATININE 0.85 01/09/2019 0859   CREATININE 0.64 01/09/2013 1613   CALCIUM 8.9 01/09/2019 0859   CALCIUM 8.7 01/09/2013 1613   GFRNONAA 82 01/09/2019 0859   GFRNONAA >60 01/09/2013 1613   GFRAA 94 01/09/2019 0859   GFRAA >60 01/09/2013 1613   CrCl cannot be calculated (Patient's most recent lab result is older than the maximum 21 days allowed.).  COAG No results found for: INR, PROTIME  Radiology No results found.   Assessment/Plan 1. Lymphedema Recommend:  No surgery or intervention at this point in time.    I have reviewed my  previous discussion with the patient regarding swelling and why it causes symptoms.  Patient will continue wearing graduated compression stockings class 1 (20-30 mmHg) on a daily basis. The patient will  beginning wearing the stockings first thing in the morning and removing them in the evening. The patient is instructed specifically not to sleep in the stockings.    In addition, behavioral modification including several periods of elevation of the lower extremities during the day will be continued.  This was reviewed with the patient during the initial visit.  The patient will also continue routine exercise, especially walking on a daily basis as was discussed during the initial visit.    Despite conservative treatments including graduated compression therapy class 1 and behavioral modification including exercise and elevation the patient  has not obtained adequate control of the lymphedema.  The patient still has stage 3 lymphedema and therefore, I believe that a lymph pump should be added to improve the control of the patient's lymphedema.  Additionally, a lymph pump is warranted because it will reduce the risk of cellulitis and ulceration in the  future.  Patient should follow-up in six months    2. Pain in both lower extremities See #1  3. Lipedema She will continue to research this particular problem. We will maximize the treatment of her lymphedema.  I would defer any surgical intervention to plastic surgery.   Hortencia Pilar, MD  04/19/2019 1:49 PM

## 2019-04-25 ENCOUNTER — Encounter (INDEPENDENT_AMBULATORY_CARE_PROVIDER_SITE_OTHER): Payer: Self-pay | Admitting: Vascular Surgery

## 2019-05-08 DIAGNOSIS — Z20822 Contact with and (suspected) exposure to covid-19: Secondary | ICD-10-CM | POA: Diagnosis not present

## 2019-05-08 DIAGNOSIS — Z20828 Contact with and (suspected) exposure to other viral communicable diseases: Secondary | ICD-10-CM | POA: Diagnosis not present

## 2019-05-31 DIAGNOSIS — L578 Other skin changes due to chronic exposure to nonionizing radiation: Secondary | ICD-10-CM | POA: Diagnosis not present

## 2019-05-31 DIAGNOSIS — Z85828 Personal history of other malignant neoplasm of skin: Secondary | ICD-10-CM | POA: Diagnosis not present

## 2019-05-31 DIAGNOSIS — Z86018 Personal history of other benign neoplasm: Secondary | ICD-10-CM | POA: Diagnosis not present

## 2019-05-31 DIAGNOSIS — L820 Inflamed seborrheic keratosis: Secondary | ICD-10-CM | POA: Diagnosis not present

## 2019-07-13 ENCOUNTER — Ambulatory Visit (INDEPENDENT_AMBULATORY_CARE_PROVIDER_SITE_OTHER): Payer: Federal, State, Local not specified - PPO | Admitting: Obstetrics and Gynecology

## 2019-07-13 ENCOUNTER — Other Ambulatory Visit: Payer: Self-pay

## 2019-07-13 ENCOUNTER — Encounter: Payer: Self-pay | Admitting: Obstetrics and Gynecology

## 2019-07-13 VITALS — BP 104/66 | HR 86 | Ht 67.0 in | Wt 232.0 lb

## 2019-07-13 DIAGNOSIS — N946 Dysmenorrhea, unspecified: Secondary | ICD-10-CM

## 2019-07-13 DIAGNOSIS — Z01419 Encounter for gynecological examination (general) (routine) without abnormal findings: Secondary | ICD-10-CM | POA: Diagnosis not present

## 2019-07-13 DIAGNOSIS — Z1239 Encounter for other screening for malignant neoplasm of breast: Secondary | ICD-10-CM | POA: Diagnosis not present

## 2019-07-13 NOTE — Patient Instructions (Signed)
[facility] [address] [place] [address]  [facility] [place]  [address] [place] [address]  Phone: [phone]  

## 2019-07-13 NOTE — Progress Notes (Signed)
Gynecology Annual Exam  PCP: Mar Daring, PA-C  Chief Complaint:  Chief Complaint  Patient presents with  . Gynecologic Exam    pain and bleeding within the last year    History of Present Illness: Patient is a 49 y.o. G2P2 presents for annual exam. The patient has no complaints today.   LMP: Patient's last menstrual period was 07/03/2019.   Is having regular monthly menses first day significant dysmenorrhea. Duration 4 day.  She has a prior ablation procedure about 10 years ago.  The patient is sexually active. She currently uses vasectomy for contraception. She denies dyspareunia.  The patient does perform self breast exams.  There is no notable family history of breast or ovarian cancer in her family.  The patient wears seatbelts: yes.   The patient has regular exercise: not asked.    The patient denies current symptoms of depression.    Review of Systems: Review of Systems  Constitutional: Negative for chills and fever.  HENT: Negative for congestion.   Respiratory: Negative for cough and shortness of breath.   Cardiovascular: Negative for chest pain and palpitations.  Gastrointestinal: Negative for abdominal pain, constipation, diarrhea, heartburn, nausea and vomiting.  Genitourinary: Negative for dysuria, frequency and urgency.  Skin: Negative for itching and rash.  Neurological: Negative for dizziness and headaches.  Endo/Heme/Allergies: Negative for polydipsia.  Psychiatric/Behavioral: Negative for depression.    Past Medical History:  Patient Active Problem List   Diagnosis Date Noted  . Leg pain 01/31/2019  . Lymphedema 01/20/2019  . 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 Lipedema 01/10/2019  . Endometriosis determined by laparoscopy 07/06/2018    Stage II 01/26/2013 with both ovarian fossas involved, posterior cul de sac peritoneal window   . Anxiety, generalized 09/19/2017  . Atypical chest pain 08/26/2017  . Pruritus ani 08/24/2016  . History of  rectal bleeding 08/24/2016  . Obesity (BMI 30-39.9) 11/20/2014  . Dermatitis, eczematoid 11/20/2014  . External hemorrhoid 11/20/2014  . High potassium 11/20/2014  . Bursitis 11/20/2014  . Migraine 08/28/2014  . Gallbladder polyp 09/13/2012  . Avitaminosis D 05/19/2009  . Bloodgood disease 04/29/2009    Past Surgical History:  Past Surgical History:  Procedure Laterality Date  . ABLATION  2012   cyst removal  . CHOLECYSTECTOMY N/A 10/19/2018   Procedure: LAPAROSCOPIC CHOLECYSTECTOMY;  Surgeon: Jules Husbands, MD;  Location: [facility] ORS;  Service: General;  Laterality: N/A;  . LAPAROSCOPIC OVARIAN CYSTECTOMY Right 10/19/2018   Procedure: LAPAROSCOPIC RIGHT OVARIAN CYSTECTOMY;  Surgeon: Malachy Mood, MD;  Location: [facility] ORS;  Service: Gynecology;  Laterality: Right;  . NECK SURGERY  02/22/2018   Artificial Disc replacement   . OVARIAN CYST REMOVAL      Gynecologic History:  Patient's last menstrual period was 07/03/2019. Contraception: vasectomy Last Pap: Results were: 07/06/2018 NIL and HR HPV negative  Last mammogram: 09/27/2018 Results were: BI-RAD I  Obstetric History: G2P2  Family History:  Family History  Problem Relation Age of Onset  . Lung cancer Father   . Diabetes Mother   . Stroke Mother   . Diabetes Maternal Grandmother   . Colon cancer Neg Hx   . Breast cancer Neg Hx     Social History:  Social History   Socioeconomic History  . Marital status: Married    Spouse name: Not on file  . Number of children: Not on file  . Years of education: Not on file  . Highest education level: Not on file  Occupational History  .  Not on file  Tobacco Use  . Smoking status: Former Smoker    Types: Cigarettes    Quit date: 02/01/1997    Years since quitting: 22.4  . Smokeless tobacco: Never Used  Vaping Use  . Vaping Use: Never used  Substance and Sexual Activity  . Alcohol use: Yes    Comment: occassional   . Drug use: No  . Sexual activity: Yes    Birth  control/protection: None  Other Topics Concern  . Not on file  Social History Narrative  . Not on file   Social Determinants of Health   Financial Resource Strain:   . Difficulty of Paying Living Expenses:   Food Insecurity:   . Worried About Charity fundraiser in the Last Year:   . Arboriculturist in the Last Year:   Transportation Needs:   . Film/video editor (Medical):   [name] Lack of Transportation (Non-Medical):   Physical Activity:   . Days of Exercise per Week:   . Minutes of Exercise per Session:   Stress:   . Feeling of Stress :   Social Connections:   . Frequency of Communication with Friends and Family:   . Frequency of Social Gatherings with Friends and Family:   . Attends Religious Services:   . Active Member of Clubs or Organizations:   . Attends Archivist Meetings:   [name] Marital Status:   Intimate Partner Violence:   . Fear of Current or Ex-Partner:   . Emotionally Abused:   [name] Physically Abused:   . Sexually Abused:     Allergies:  No Known Allergies  Medications: Prior to Admission medications   Medication Sig Start Date End Date Taking? Authorizing Provider  cholecalciferol (VITAMIN D3) 25 MCG (1000 UT) tablet Take 1,000 Units by mouth daily.    [provider]  clonazePAM (KLONOPIN) 0.5 MG tablet TAKE 1 TABLET BY MOUTH TWICE DAILY AS NEEDED FOR ANXIETY OR PALPITATIONS 04/16/19   Mar Daring, PA-C  fluticasone (FLONASE) 50 MCG/ACT nasal spray USE 1 SPRAY IN EACH NOSTRIL TWICE DAILY Patient taking differently: Place 2 sprays into both nostrils daily as needed for allergies or rhinitis.  04/18/18   Betancourt, Aura Fey, NP  meclizine (ANTIVERT) 25 MG tablet Take 1 tablet (25 mg total) by mouth 3 (three) times daily as needed for dizziness. 07/14/18   Mar Daring, PA-C  Multiple Vitamin (MULTIVITAMIN WITH MINERALS) TABS tablet Take 1 tablet by mouth daily.    [provider]  promethazine (PHENERGAN) 25 MG tablet  Take 1 tablet (25 mg total) by mouth every 8 (eight) hours as needed for nausea or vomiting. 09/05/18   Dutch Quint B, FNP  SUMAtriptan (IMITREX) 50 MG tablet TAKE 1 TABLET BY MOUTH IMMEDIATELY, MAY REPEAT AFTER 2 HOURS AS NEEDED 02/09/19   Mar Daring, PA-C    Physical Exam Vitals: Blood pressure 104/66, pulse 86, height 5\' 7"  (1.702 m), weight 232 lb (105.2 kg), last menstrual period 07/03/2019.   General: NAD HEENT: normocephalic, anicteric Thyroid: no enlargement, no palpable nodules Pulmonary: No increased work of breathing, CTAB Cardiovascular: RRR, distal pulses 2+ Breast: Breast symmetrical, no tenderness, no palpable nodules or masses, no skin or nipple retraction present, no nipple discharge.  No axillary or supraclavicular lymphadenopathy. Abdomen: NABS, soft, non-tender, non-distended.  Umbilicus without lesions.  No hepatomegaly, splenomegaly or masses palpable. No evidence of hernia  Genitourinary:  External: Normal external female genitalia.  Normal urethral meatus, normal Bartholin's  and Skene's glands.    Vagina: Normal vaginal mucosa, no evidence of prolapse.    Cervix: Grossly normal in appearance, no bleeding  Uterus: Non-enlarged, mobile, normal contour.  No CMT  Adnexa: ovaries non-enlarged, no adnexal masses  Rectal: deferred  Lymphatic: no evidence of inguinal lymphadenopathy Extremities: no edema, erythema, or tenderness Neurologic: Grossly intact Psychiatric: mood appropriate, affect full  Female chaperone present for pelvic and breast  portions of the physical exam    Assessment: 49 y.o. G2P2 routine annual exam  Plan: Problem List Items Addressed This Visit    None    Visit Diagnoses    Encounter for gynecological examination without abnormal finding    -  Primary   Breast screening       Relevant Orders   MM 3D SCREEN BREAST BILATERAL   Dysmenorrhea          1) Mammogram - recommend yearly screening mammogram.  Mammogram Is up to  date - ordered for August  2) STI screening  was notoffered and therefore not obtained  3) ASCCP guidelines and rational discussed.  Patient opts for every 3 years screening interval  4) Contraception - the patient is currently using  vasectomy.  She is happy with her current form of contraception and plans to continue  5) Colonoscopy -- Screening recommended starting at age 56 for average risk individuals, age 98 for individuals deemed at increased risk (including African Americans) and recommended to continue until age 53.  For patient age 69-85 individualized approach is recommended.  Gold standard screening is via colonoscopy, Cologuard screening is an acceptable alternative for patient unwilling or unable to undergo colonoscopy.  "Colorectal cancer screening for average?risk adults: 2018 guideline update from the American Cancer Society"CA: A Cancer Journal for Clinicians: Jun 30, 2016   6) Routine healthcare maintenance including cholesterol, diabetes screening discussed managed by PCP   7_ Dysmenorrhea - discussed options  Ponstel - prescription oral NSAID that can be used for 3 days out of the month to cover menstrual pain.  May cause GI upset or stomach ulcers  Orilissa - oral medicine once daily that suppresses circulating levels of estrogen.  Generally response in pain within the first 6 weeks of use.  Causes irregular menstrual cycles or absence of menstrual cycles as well as hot flashes  Norethindrone - oral progestin medicine.  Suppresses the menstrual cycle.  All progestin including depo provera shot which we sometime use for endometriosis may cause water weight retention so not a great choice in lymphedema  Mirena IUD - progestin containing intrauterine device would have same benefit of the other progestins without affecting the lymphedema as it is administered locally in the uterus.  Good for up to 7 years after placement.  However, this may not be feasible given scarring of the  endometrial cavity with prior ablation procedure  Hysterectomy - definitive management but the patient would like to avoid surgery if at all possible  7) Return in about 1 year (around 07/12/2020) for annual.   Malachy Mood, MD, [place] 07/13/2019, 4:27 PM

## 2019-08-07 ENCOUNTER — Encounter: Payer: Self-pay | Admitting: Physician Assistant

## 2019-08-15 ENCOUNTER — Other Ambulatory Visit: Payer: Self-pay | Admitting: Obstetrics and Gynecology

## 2019-08-15 DIAGNOSIS — Z1239 Encounter for other screening for malignant neoplasm of breast: Secondary | ICD-10-CM

## 2019-09-12 ENCOUNTER — Other Ambulatory Visit: Payer: Self-pay | Admitting: Obstetrics and Gynecology

## 2019-09-12 DIAGNOSIS — N809 Endometriosis, unspecified: Secondary | ICD-10-CM

## 2019-09-13 DIAGNOSIS — N83201 Unspecified ovarian cyst, right side: Secondary | ICD-10-CM | POA: Diagnosis not present

## 2019-09-13 DIAGNOSIS — R102 Pelvic and perineal pain: Secondary | ICD-10-CM | POA: Diagnosis not present

## 2019-09-13 NOTE — Telephone Encounter (Signed)
Patient reports abd/pelvic pain. She's having shooting pains in her lower abd that have her doubled over. The more she thinks about it, she thinks it's not her period, it just feels different. Inquired if she needs to be seen at office or go to ED. [phone]

## 2019-09-13 NOTE — Telephone Encounter (Signed)
Spoke w/patient. She states the pain has subsided somewhat. Advised she would likely need u/s for evaluation and we do not have u/s in office today. Also, if pain is doubling her over, our protocol is to send to ED. Advised she can take Ibuprofen 100mg /hr (400mg /q4, 600mg /q6, 800mg /q8) If the pain is uncontrolled with the ibuprofen, then report to ED. Very little d/c d/t ablation. Advised I will send message to AMS who is in OR today for review.

## 2019-09-28 ENCOUNTER — Ambulatory Visit: Payer: Federal, State, Local not specified - PPO

## 2019-10-04 ENCOUNTER — Ambulatory Visit
Admission: RE | Admit: 2019-10-04 | Discharge: 2019-10-04 | Disposition: A | Discharge status: Home / Self Care | Payer: Federal, State, Local not specified - PPO | Source: Ambulatory Visit | Attending: Obstetrics and Gynecology | Admitting: Obstetrics and Gynecology

## 2019-10-04 ENCOUNTER — Other Ambulatory Visit: Payer: Self-pay

## 2019-10-04 DIAGNOSIS — Z1231 Encounter for screening mammogram for malignant neoplasm of breast: Secondary | ICD-10-CM | POA: Diagnosis not present

## 2019-10-04 DIAGNOSIS — Z1239 Encounter for other screening for malignant neoplasm of breast: Secondary | ICD-10-CM

## 2019-10-09 ENCOUNTER — Telehealth: Payer: Self-pay

## 2019-10-09 NOTE — Telephone Encounter (Signed)
Pt advised.   Thanks,   -Averee Harb  

## 2019-10-09 NOTE — Telephone Encounter (Signed)
-----   Message from Mar Daring, [place] sent at 10/09/2019 12:19 PM EDT ----- Normal mammogram. Repeat screening in one year.

## 2019-10-12 ENCOUNTER — Encounter: Payer: Self-pay | Admitting: Physician Assistant

## 2019-10-22 ENCOUNTER — Encounter (INDEPENDENT_AMBULATORY_CARE_PROVIDER_SITE_OTHER): Payer: Self-pay | Admitting: Vascular Surgery

## 2019-10-22 ENCOUNTER — Other Ambulatory Visit: Payer: Self-pay

## 2019-10-22 ENCOUNTER — Ambulatory Visit (INDEPENDENT_AMBULATORY_CARE_PROVIDER_SITE_OTHER): Payer: Federal, State, Local not specified - PPO | Admitting: Vascular Surgery

## 2019-10-22 VITALS — BP 121/79 | HR 82 | Ht 67.0 in | Wt 237.0 lb

## 2019-10-22 DIAGNOSIS — I890 Lymphedema, not elsewhere classified: Secondary | ICD-10-CM | POA: Diagnosis not present

## 2019-10-22 NOTE — Progress Notes (Signed)
MRN : [id number]  Norma Wall is a 49 y.o. (February 04, 1970) female who presents with chief complaint of  Chief Complaint  Patient presents with  . Follow-up    6 mo no studies  .  History of Present Illness:   The patient returns to the office for followup evaluation regarding leg swelling.  The swelling has improved  and the pain associated with swelling has decreased. She is using her lymph pump when her swelling is increased.  There have not been any interval development of a ulcerations or wounds.  Since the previous visit the patient has been wearing graduated compression stockings and has noted some improvement in the lymphedema.   The patient also states elevation during the day and exercise is being done too.     Current Meds  Medication Sig  . cholecalciferol (VITAMIN D3) 25 MCG (1000 UT) tablet Take 1,000 Units by mouth daily.  . clonazePAM (KLONOPIN) 0.5 MG tablet TAKE 1 TABLET BY MOUTH TWICE DAILY AS NEEDED FOR ANXIETY OR PALPITATIONS  . fluticasone (FLONASE) 50 MCG/ACT nasal spray USE 1 SPRAY IN EACH NOSTRIL TWICE DAILY (Patient taking differently: Place 2 sprays into both nostrils daily as needed for allergies or rhinitis. )  . meclizine (ANTIVERT) 25 MG tablet Take 1 tablet (25 mg total) by mouth 3 (three) times daily as needed for dizziness.  . Multiple Vitamin (MULTIVITAMIN WITH MINERALS) TABS tablet Take 1 tablet by mouth daily.  . promethazine (PHENERGAN) 25 MG tablet Take 1 tablet (25 mg total) by mouth every 8 (eight) hours as needed for nausea or vomiting.  . SUMAtriptan (IMITREX) 50 MG tablet TAKE 1 TABLET BY MOUTH IMMEDIATELY, MAY REPEAT AFTER 2 HOURS AS NEEDED    Past Medical History:  Diagnosis Date  . Abdominal pain, right upper quadrant 2013  . Anxiety   . Endometriosis 01/2013  . Gallbladder polyp   . Lipidemia 2020  . Lymphedema 01/2019  . Ovarian cyst 2014   right ovary    Past Surgical History:  Procedure Laterality Date  .  ABLATION  2012   cyst removal  . CHOLECYSTECTOMY N/A 10/19/2018   Procedure: LAPAROSCOPIC CHOLECYSTECTOMY;  Surgeon: Jules Husbands, MD;  Location: [facility] ORS;  Service: General;  Laterality: N/A;  . LAPAROSCOPIC OVARIAN CYSTECTOMY Right 10/19/2018   Procedure: LAPAROSCOPIC RIGHT OVARIAN CYSTECTOMY;  Surgeon: Malachy Mood, MD;  Location: [facility] ORS;  Service: Gynecology;  Laterality: Right;  . NECK SURGERY  02/22/2018   Artificial Disc replacement   . OVARIAN CYST REMOVAL      Social History Social History   Tobacco Use  . Smoking status: Former Smoker    Types: Cigarettes    Quit date: 02/01/1997    Years since quitting: 22.7  . Smokeless tobacco: Never Used  Vaping Use  . Vaping Use: Never used  Substance Use Topics  . Alcohol use: Yes    Comment: occassional   . Drug use: No    Family History Family History  Problem Relation Age of Onset  . Lung cancer Father   . Diabetes Mother   . Stroke Mother   . Diabetes Maternal Grandmother   . Colon cancer Neg Hx   . Breast cancer Neg Hx     No Known Allergies   REVIEW OF SYSTEMS (Negative unless checked)  Constitutional: [] Weight loss  [] Fever  [] Chills Cardiac: [] Chest pain   [] Chest pressure   [] Palpitations   [] Shortness of breath when laying flat   [] Shortness of breath  with exertion. Vascular:  [] Pain in legs with walking   [] Pain in legs at rest  [] History of DVT   [] Phlebitis   [x] Swelling in legs   [] Varicose veins   [] Non-healing ulcers Pulmonary:   [] Uses home oxygen   [] Productive cough   [] Hemoptysis   [] Wheeze  [] COPD   [] Asthma Neurologic:  [] Dizziness   [] Seizures   [] History of stroke   [] History of TIA  [] Aphasia   [] Vissual changes   [] Weakness or numbness in arm   [] Weakness or numbness in leg Musculoskeletal:   [] Joint swelling   [] Joint pain   [] Low back pain Hematologic:  [] Easy bruising  [] Easy bleeding   [] Hypercoagulable state   [] Anemic Gastrointestinal:  [] Diarrhea   [] Vomiting  [] Gastroesophageal  reflux/heartburn   [] Difficulty swallowing. Genitourinary:  [] Chronic kidney disease   [] Difficult urination  [] Frequent urination   [] Blood in urine Skin:  [] Rashes   [] Ulcers  Psychological:  [] History of anxiety   []  History of major depression.  Physical Examination  Vitals:   10/22/19 1335  BP: 121/79  Pulse: 82  Weight: 237 lb (107.5 kg)  Height: 5\' 7"  (1.702 m)   Body mass index is 37.12 kg/m. Gen: WD/WN, NAD Head: [place]/AT, No temporalis wasting.  Ear/Nose/Throat: Hearing grossly intact, nares w/o erythema or drainage Eyes: PER, EOMI, sclera nonicteric.  Neck: Supple, no large masses.   Pulmonary:  Good air movement, no audible wheezing bilaterally, no use of accessory muscles.  Cardiac: RRR, no JVD Vascular: scattered varicosities present bilaterally.  Mild venous stasis changes to the legs bilaterally.  2+ soft pitting edema Vessel Right Left  Radial Palpable Palpable  Gastrointestinal: Non-distended. No guarding/no peritoneal signs.  Musculoskeletal: M/S 5/5 throughout.  No deformity or atrophy.  Neurologic: CN 2-12 intact. Symmetrical.  Speech is fluent. Motor exam as listed above. Psychiatric: Judgment intact, Mood & affect appropriate for pt's clinical situation. Dermatologic: Very mild venous rashes no ulcers noted.  No changes consistent with cellulitis. Lymph : No lichenification or skin changes of chronic lymphedema.  CBC Lab Results  Component Value Date   WBC 3.6 01/09/2019   HGB 13.5 01/09/2019   HCT 41.5 01/09/2019   MCV 91 01/09/2019   PLT 200 01/09/2019    BMET    Component Value Date/Time   NA 141 01/09/2019 0859   NA 139 01/09/2013 1613   K 4.4 01/09/2019 0859   K 3.8 01/09/2013 1613   CL 107 (H) 01/09/2019 0859   CL 108 (H) 01/09/2013 1613   CO2 22 01/09/2019 0859   CO2 27 01/09/2013 1613   GLUCOSE 81 01/09/2019 0859   GLUCOSE 112 (H) 08/30/2018 1701   GLUCOSE 88 01/09/2013 1613   BUN 12 01/09/2019 0859   BUN 15 01/09/2013 1613    CREATININE 0.85 01/09/2019 0859   CREATININE 0.64 01/09/2013 1613   CALCIUM 8.9 01/09/2019 0859   CALCIUM 8.7 01/09/2013 1613   GFRNONAA 82 01/09/2019 0859   GFRNONAA >60 01/09/2013 1613   GFRAA 94 01/09/2019 0859   GFRAA >60 01/09/2013 1613   CrCl cannot be calculated (Patient's most recent lab result is older than the maximum 21 days allowed.).  COAG No results found for: INR, PROTIME  Radiology MM 3D SCREEN BREAST BILATERAL  Result Date: 10/09/2019 CLINICAL DATA:  Screening. EXAM: DIGITAL SCREENING BILATERAL MAMMOGRAM WITH TOMO AND CAD COMPARISON:  Previous exam(s). ACR Breast Density Category b: There are scattered areas of fibroglandular density. FINDINGS: There are no findings suspicious for malignancy. Images were processed  with CAD. IMPRESSION: No mammographic evidence of malignancy. A result letter of this screening mammogram will be mailed directly to the patient. RECOMMENDATION: Screening mammogram in one year. (Code:SM-B-01Y) BI-RADS CATEGORY  1: Negative. Electronically Signed   By: Everlean Alstrom M.D.   On: 10/09/2019 08:57      Assessment/Plan 1. Lymphedema  No surgery or intervention at this point in time.    I have reviewed my discussion with the patient regarding lymphedema and why it  causes symptoms.  Patient will continue wearing graduated compression stockings class 1 (20-30 mmHg) on a daily basis a prescription was given. The patient is reminded to put the stockings on first thing in the morning and removing them in the evening. The patient is instructed specifically not to sleep in the stockings.   In addition, behavioral modification throughout the day will be continued.  This will include frequent elevation (such as in a recliner), use of over the counter pain medications as needed and exercise such as walking.  I have reviewed systemic causes for chronic edema such as liver, kidney and cardiac etiologies and there does not appear to be any significant changes  in these organ systems over the past year.  The patient is under the impression that these organ systems are all stable and unchanged.    The patient will continue aggressive use of the  lymph pump.  This will continue to improve the edema control and prevent sequela such as ulcers and infections.   The patient will follow-up with me on an annual basis.     Hortencia Pilar, MD  10/22/2019 1:51 PM

## 2019-10-24 ENCOUNTER — Encounter (INDEPENDENT_AMBULATORY_CARE_PROVIDER_SITE_OTHER): Payer: Self-pay | Admitting: Vascular Surgery

## 2019-11-12 ENCOUNTER — Encounter: Payer: Self-pay | Admitting: Physician Assistant

## 2019-11-14 ENCOUNTER — Telehealth: Payer: Self-pay

## 2019-11-14 NOTE — Telephone Encounter (Signed)
LM that Form is completed and ready for pick up.

## 2019-12-20 DIAGNOSIS — N809 Endometriosis, unspecified: Secondary | ICD-10-CM | POA: Diagnosis not present

## 2019-12-20 DIAGNOSIS — R198 Other specified symptoms and signs involving the digestive system and abdomen: Secondary | ICD-10-CM | POA: Diagnosis not present

## 2019-12-20 DIAGNOSIS — Z6835 Body mass index (BMI) 35.0-35.9, adult: Secondary | ICD-10-CM | POA: Diagnosis not present

## 2019-12-24 ENCOUNTER — Other Ambulatory Visit: Payer: Self-pay

## 2019-12-24 ENCOUNTER — Ambulatory Visit (INDEPENDENT_AMBULATORY_CARE_PROVIDER_SITE_OTHER): Payer: Federal, State, Local not specified - PPO | Admitting: Family Medicine

## 2019-12-24 DIAGNOSIS — Z23 Encounter for immunization: Secondary | ICD-10-CM

## 2019-12-31 IMAGING — CR DG CERVICAL SPINE COMPLETE 4+V
5 series · 5 of 5 positions shown · non-contrast
Comparison: Cervical spine MR dated 01/18/2018.

CLINICAL DATA: Neck pain. Status post anterior cervical discectomy
and fusion 4 days ago. Dysphagia. Nausea and dizziness.

EXAM:
CERVICAL SPINE - COMPLETE 4+ VIEW

[c-spine lat]
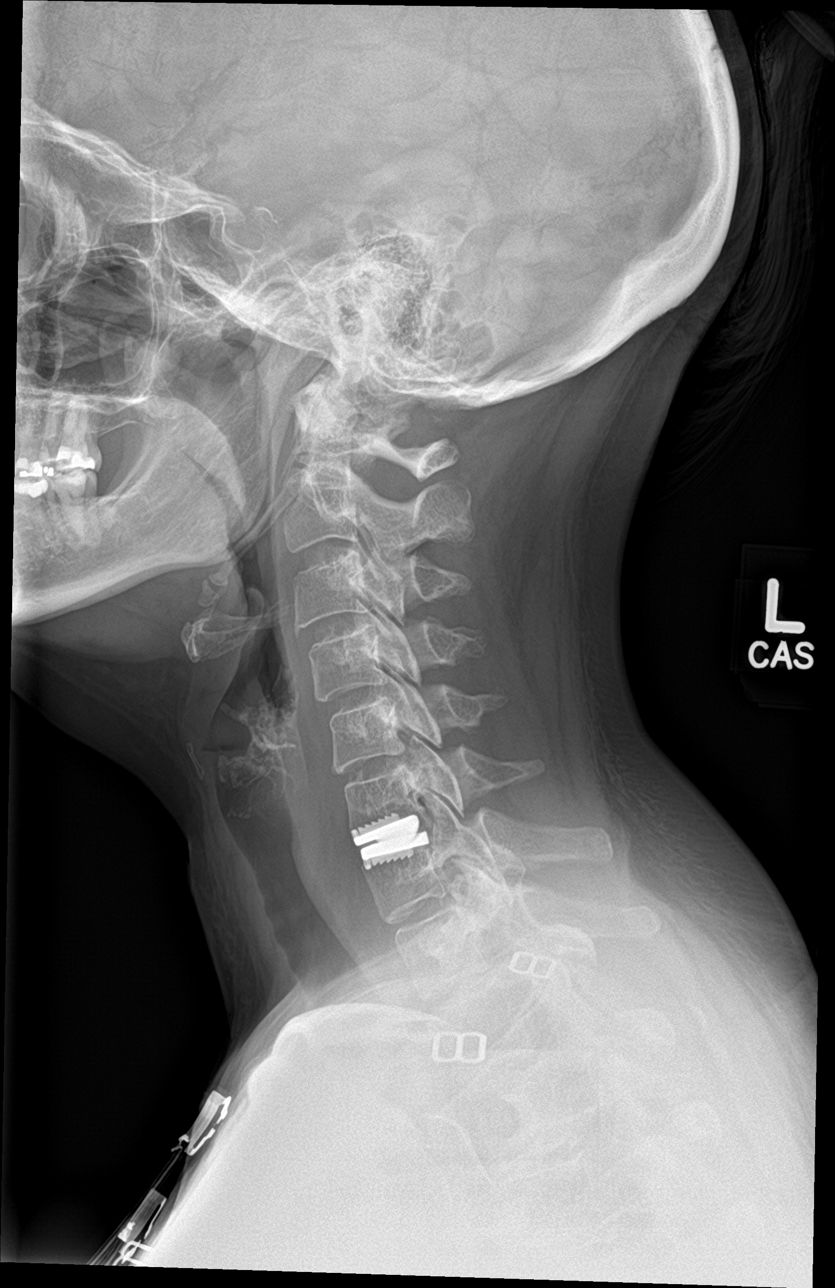

[c-spine obl (1 of 2)]
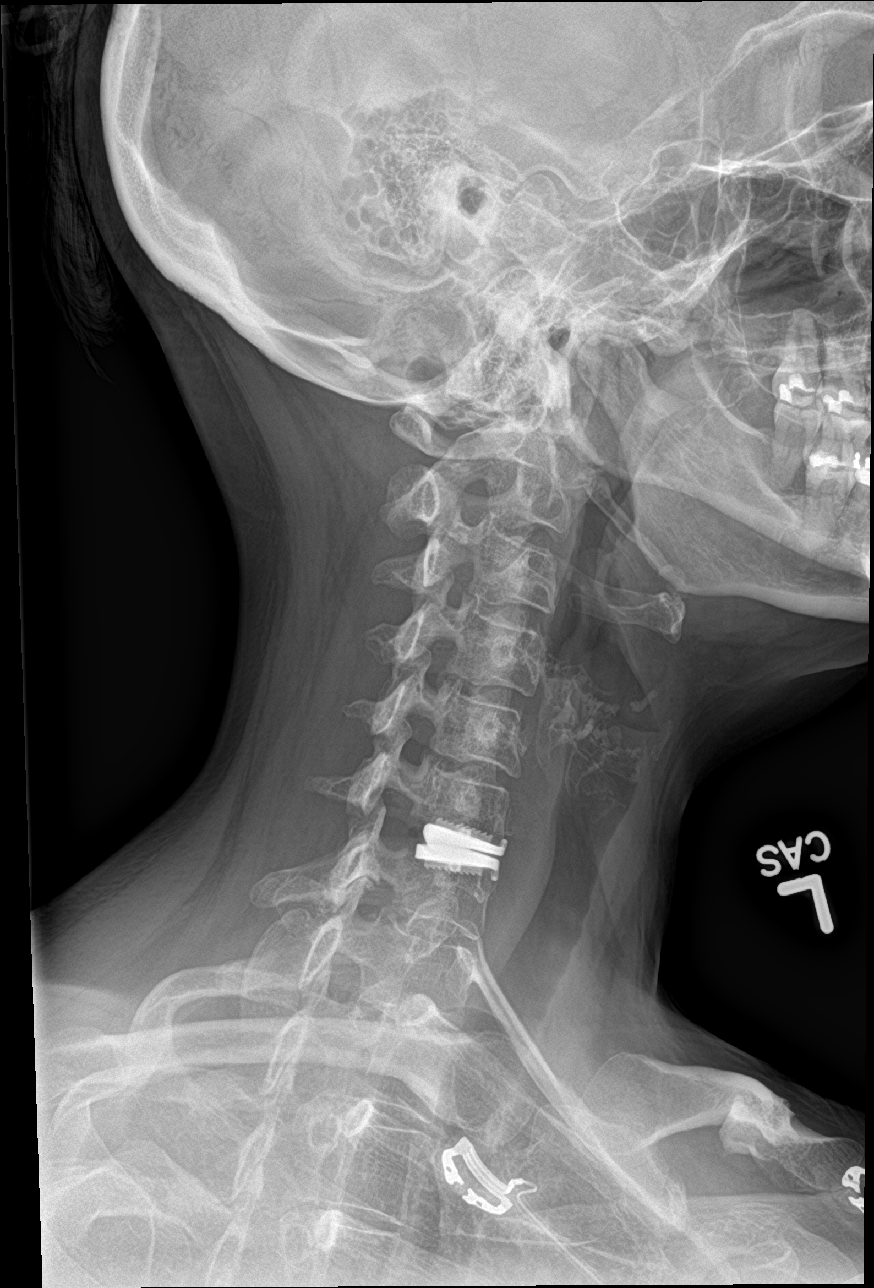

[c-spine obl (2 of 2)]
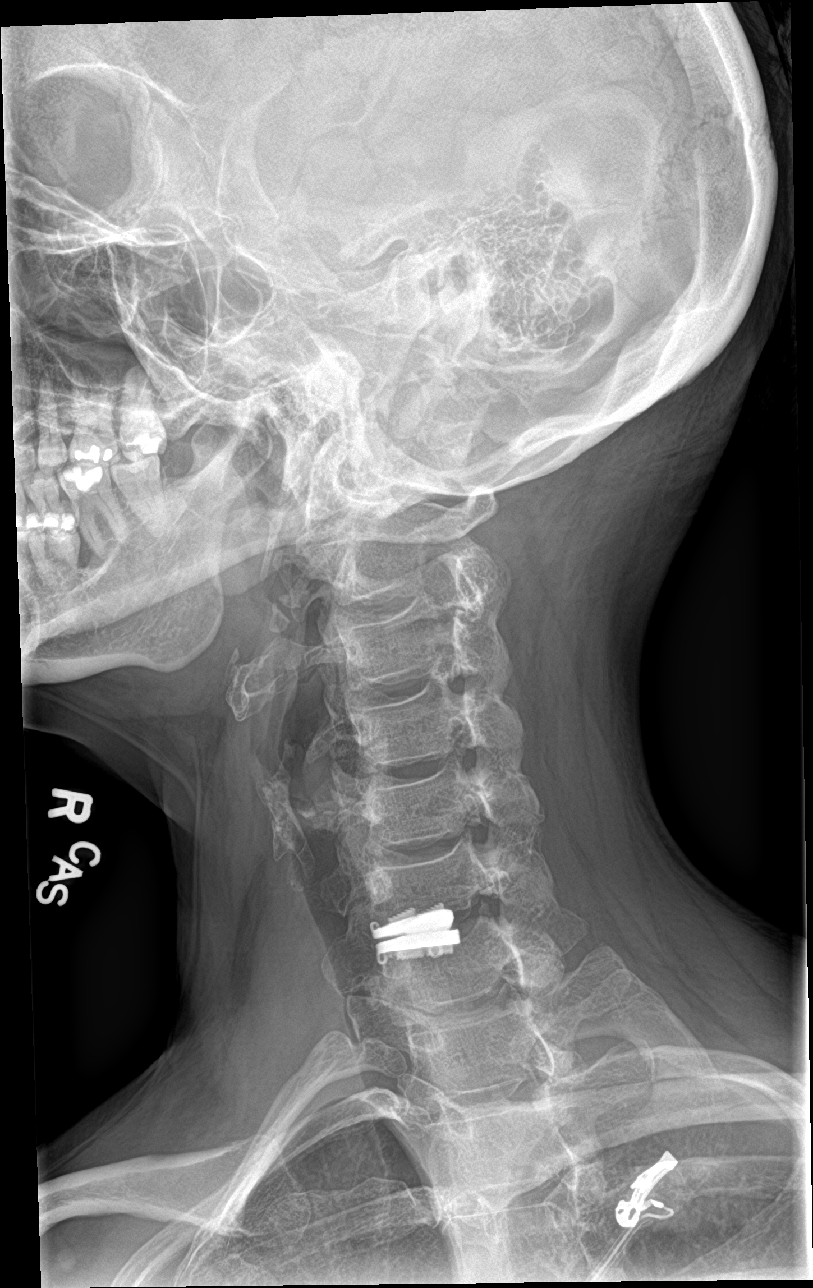

[c-spine ap]
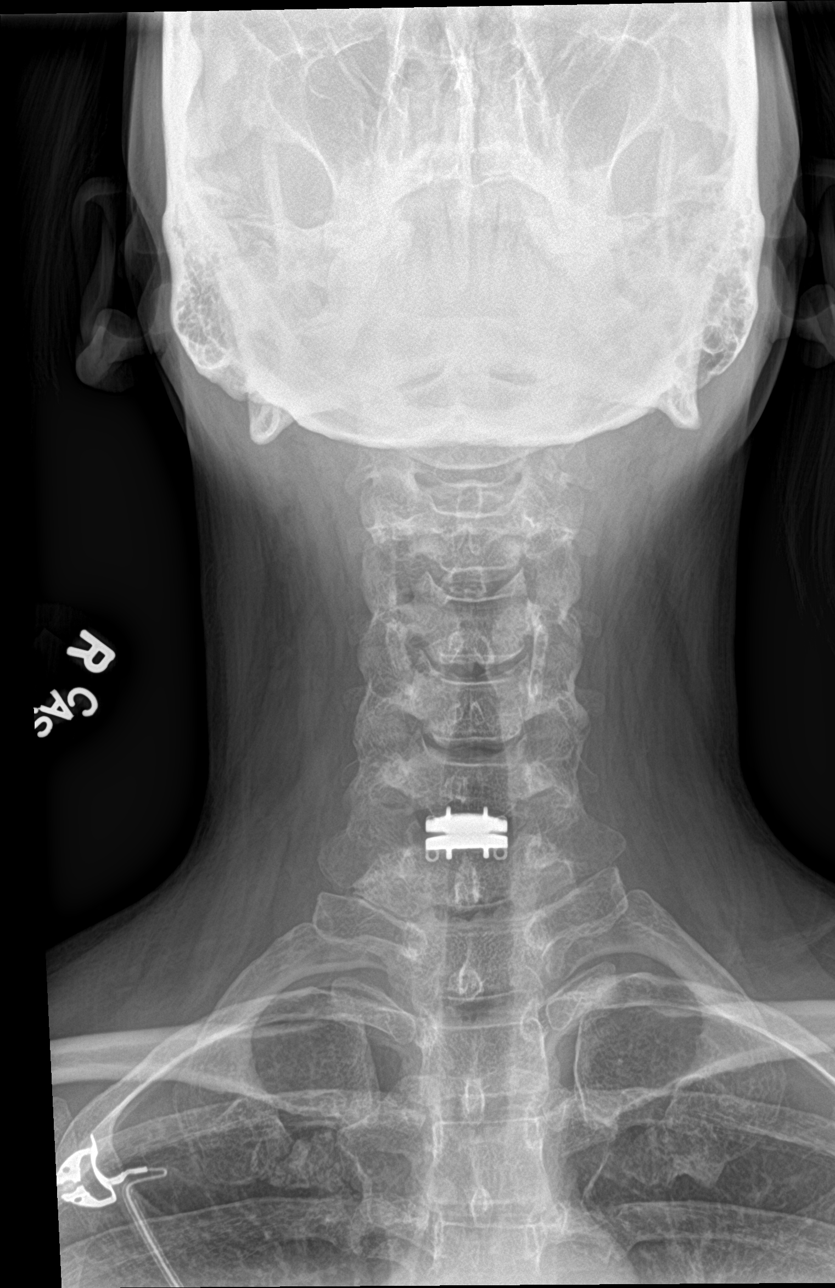

[c-spine open mouth]
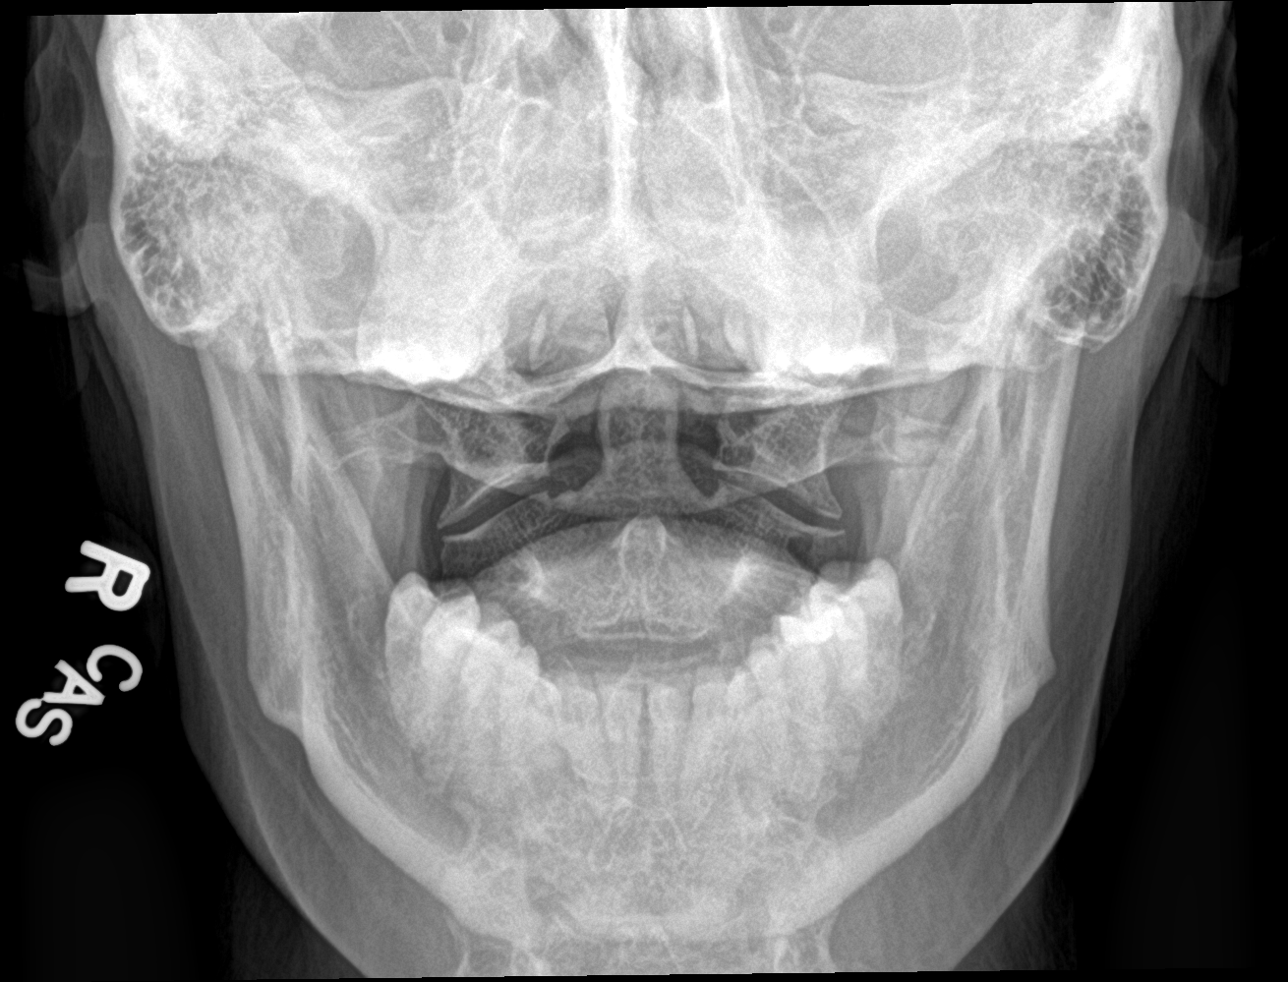

[5 of 5 positions shown; findings below may reference images not displayed]

FINDINGS: Interval interbody metallic spacer at the C6-7 level with normal
alignment. Minimal anterior spur formation at the C3-4 and C5-6
levels. Uncinate spurs producing mild foraminal stenosis on the
right at the C3-4, C4-5 and C5-6 levels. The neural foramina on the
left are more difficult to assess due to lack of sufficient
obliquity. No prevertebral soft tissue swelling or gas.
IMPRESSION: Postoperative and degenerative changes, as described above. No
prevertebral soft tissue swelling.

## 2020-01-01 ENCOUNTER — Other Ambulatory Visit: Payer: Self-pay | Admitting: Physician Assistant

## 2020-01-01 DIAGNOSIS — G43709 Chronic migraine without aura, not intractable, without status migrainosus: Secondary | ICD-10-CM

## 2020-01-01 MED ORDER — SUMATRIPTAN SUCCINATE 50 MG PO TABS: 50.0000 mg | ORAL_TABLET | ORAL | 5 refills | Status: DC | PRN

## 2020-01-01 NOTE — Telephone Encounter (Signed)
[facility] faxed refill request for the following medications:  SUMAtriptan (IMITREX) 50 MG tablet  Please advise. Thanks, [employer]

## 2020-01-08 NOTE — Progress Notes (Signed)
Complete physical exam   Patient: Norma Wall   DOB: [birth year]   49 y.o. Female  MRN: [id number] Visit Date: 01/09/2020  Today's healthcare provider: Mar Daring, PA-C   Chief Complaint  Patient presents with  . Annual Exam   Subjective    Norma Wall is a 49 y.o. female who presents today for a complete physical exam.  She reports consuming a general diet. The patient does not participate in regular exercise at present. She generally feels well. She reports sleeping well. She does not have additional problems to discuss today.  HPI  Declined Influenza vaccine. Pap-07/06/18-Normal followed by GYN 10/09/19-Normal mammogram. Repeat screening in one year  Wt Readings from Last 3 Encounters:  01/09/20 226 lb (102.5 kg)  10/22/19 237 lb (107.5 kg)  07/13/19 232 lb (105.2 kg)     Past Medical History:  Diagnosis Date  . Abdominal pain, right upper quadrant 2013  . Anxiety   . Endometriosis 01/2013  . Gallbladder polyp   . Lipidemia 2020  . Lymphedema 01/2019  . Ovarian cyst 2014   right ovary   Past Surgical History:  Procedure Laterality Date  . ABLATION  2012   cyst removal  . CHOLECYSTECTOMY N/A 10/19/2018   Procedure: LAPAROSCOPIC CHOLECYSTECTOMY;  Surgeon: Jules Husbands, MD;  Location: [facility] ORS;  Service: General;  Laterality: N/A;  . LAPAROSCOPIC OVARIAN CYSTECTOMY Right 10/19/2018   Procedure: LAPAROSCOPIC RIGHT OVARIAN CYSTECTOMY;  Surgeon: Malachy Mood, MD;  Location: [facility] ORS;  Service: Gynecology;  Laterality: Right;  . NECK SURGERY  02/22/2018   Artificial Disc replacement   . OVARIAN CYST REMOVAL     Social History   Socioeconomic History  . Marital status: Married    Spouse name: Not on file  . Number of children: Not on file  . Years of education: Not on file  . Highest education level: Not on file  Occupational History  . Not on file  Tobacco Use  . Smoking status: Former Smoker    Types: Cigarettes     Quit date: 02/01/1997    Years since quitting: 22.9  . Smokeless tobacco: Never Used  Vaping Use  . Vaping Use: Never used  Substance and Sexual Activity  . Alcohol use: Yes    Comment: occassional   . Drug use: No  . Sexual activity: Yes    Birth control/protection: None  Other Topics Concern  . Not on file  Social History Narrative  . Not on file   Social Determinants of Health   Financial Resource Strain:   . Difficulty of Paying Living Expenses: Not on file  Food Insecurity:   . Worried About Charity fundraiser in the Last Year: Not on file  . Ran Out of Food in the Last Year: Not on file  Transportation Needs:   . Lack of Transportation (Medical): Not on file  . Lack of Transportation (Non-Medical): Not on file  Physical Activity:   . Days of Exercise per Week: Not on file  . Minutes of Exercise per Session: Not on file  Stress:   . Feeling of Stress : Not on file  Social Connections:   . Frequency of Communication with Friends and Family: Not on file  . Frequency of Social Gatherings with Friends and Family: Not on file  . Attends Religious Services: Not on file  . Active Member of Clubs or Organizations: Not on file  . Attends Archivist Meetings: Not on  file  . Marital Status: Not on file  Intimate Partner Violence:   . Fear of Current or Ex-Partner: Not on file  . Emotionally Abused: Not on file  . Physically Abused: Not on file  . Sexually Abused: Not on file   Family Status  Relation Name Status  . Father  Deceased  . Mother  Deceased  . Brother  Alive  . MGM  (Not Specified)  . Neg Hx  (Not Specified)   Family History  Problem Relation Age of Onset  . Lung cancer Father   . Diabetes Mother   . Stroke Mother   . Diabetes Maternal Grandmother   . Colon cancer Neg Hx   . Breast cancer Neg Hx    No Known Allergies  Patient Care Team: Mar Daring, PA-C as PCP - General (Family Medicine) Bary Castilla, Forest Gleason, MD (General  Surgery) Margarita Rana, MD as Referring Physician (Family Medicine) Rubye Beach as Physician Assistant (Family Medicine)   Medications: Outpatient Medications Prior to Visit  Medication Sig  . cholecalciferol (VITAMIN D3) 25 MCG (1000 UT) tablet Take 1,000 Units by mouth daily.  . clonazePAM (KLONOPIN) 0.5 MG tablet TAKE 1 TABLET BY MOUTH TWICE DAILY AS NEEDED FOR ANXIETY OR PALPITATIONS  . fluticasone (FLONASE) 50 MCG/ACT nasal spray USE 1 SPRAY IN EACH NOSTRIL TWICE DAILY (Patient taking differently: Place 2 sprays into both nostrils daily as needed for allergies or rhinitis. )  . meclizine (ANTIVERT) 25 MG tablet Take 1 tablet (25 mg total) by mouth 3 (three) times daily as needed for dizziness.  . Multiple Vitamin (MULTIVITAMIN WITH MINERALS) TABS tablet Take 1 tablet by mouth daily.  . promethazine (PHENERGAN) 25 MG tablet Take 1 tablet (25 mg total) by mouth every 8 (eight) hours as needed for nausea or vomiting.  . SUMAtriptan (IMITREX) 50 MG tablet Take 1 tablet (50 mg total) by mouth every 2 (two) hours as needed for migraine. May repeat in 2 hours if headache persists or recurs.   No facility-administered medications prior to visit.    Review of Systems  Constitutional: Negative.   HENT: Negative.   Eyes: Negative.   Respiratory: Negative.   Cardiovascular: Negative.   Gastrointestinal: Negative.   Endocrine: Negative.   Genitourinary: Negative.   Musculoskeletal: Negative.   Skin: Negative.   Allergic/Immunologic: Negative.   Neurological: Negative.   Hematological: Negative.   Psychiatric/Behavioral: Negative.     Last CBC Lab Results  Component Value Date   WBC 4.3 01/11/2020   HGB 14.3 01/11/2020   HCT 42.6 01/11/2020   MCV 88 01/11/2020   MCH 29.5 01/11/2020   RDW 11.8 01/11/2020   PLT 178 71/69/6789   Last metabolic panel Lab Results  Component Value Date   GLUCOSE 83 01/11/2020   NA 137 01/11/2020   K 4.4 01/11/2020   CL 102  01/11/2020   CO2 20 01/11/2020   BUN 14 01/11/2020   CREATININE 0.89 01/11/2020   GFRNONAA 77 01/11/2020   GFRAA 89 01/11/2020   CALCIUM 9.3 01/11/2020   PROT 6.9 01/11/2020   ALBUMIN 4.3 01/11/2020   LABGLOB 2.6 01/11/2020   AGRATIO 1.7 01/11/2020   BILITOT 0.6 01/11/2020   ALKPHOS 48 01/11/2020   AST 29 01/11/2020   ALT 37 (H) 01/11/2020   ANIONGAP 8 08/30/2018      Objective    BP 112/88 (BP Location: Left Arm, Patient Position: Sitting, Cuff Size: Large)   Pulse 72   Temp 97.7 F (36.5  C) (Oral)   Resp 16   Ht 5\' 7"  (1.702 m)   Wt 226 lb (102.5 kg)   BMI 35.40 kg/m  BP Readings from Last 3 Encounters:  01/09/20 112/88  10/22/19 121/79  07/13/19 104/66   Wt Readings from Last 3 Encounters:  01/09/20 226 lb (102.5 kg)  10/22/19 237 lb (107.5 kg)  07/13/19 232 lb (105.2 kg)      Physical Exam Vitals reviewed.  Constitutional:      General: She is not in acute distress.    Appearance: Normal appearance. She is well-developed. She is obese. She is not ill-appearing or diaphoretic.  HENT:     Head: Normocephalic and atraumatic.     Right Ear: Tympanic membrane, ear canal and external ear normal.     Left Ear: Tympanic membrane, ear canal and external ear normal.  Eyes:     General: No scleral icterus.       Right eye: No discharge.        Left eye: No discharge.     Extraocular Movements: Extraocular movements intact.     Conjunctiva/sclera: Conjunctivae normal.     Pupils: Pupils are equal, round, and reactive to light.  Neck:     Thyroid: No thyromegaly.     Vascular: No JVD.     Trachea: No tracheal deviation.  Cardiovascular:     Rate and Rhythm: Normal rate and regular rhythm.     Pulses: Normal pulses.     Heart sounds: Normal heart sounds. No murmur heard.  No friction rub. No gallop.   Pulmonary:     Effort: Pulmonary effort is normal. No respiratory distress.     Breath sounds: Normal breath sounds. No wheezing or rales.  Chest:     Chest  wall: No tenderness.  Abdominal:     General: Abdomen is flat. Bowel sounds are normal. There is no distension.     Palpations: Abdomen is soft. There is no mass.     Tenderness: There is no abdominal tenderness. There is no guarding or rebound.  Musculoskeletal:        General: No tenderness. Normal range of motion.     Cervical back: Normal range of motion and neck supple. No tenderness.     Right lower leg: No edema.     Left lower leg: No edema.  Lymphadenopathy:     Cervical: No cervical adenopathy.  Skin:    General: Skin is warm and dry.     Capillary Refill: Capillary refill takes less than 2 seconds.     Findings: No rash.  Neurological:     General: No focal deficit present.     Mental Status: She is alert and oriented to person, place, and time. Mental status is at baseline.  Psychiatric:        Mood and Affect: Mood normal.        Behavior: Behavior normal.        Thought Content: Thought content normal.        Judgment: Judgment normal.      Last depression screening scores PHQ 2/9 Scores 01/09/2020 01/05/2019 09/27/2017  PHQ - 2 Score 0 0 0  PHQ- 9 Score - 3 -   Last fall risk screening Fall Risk  11/03/2018  Falls in the past year? 0  Number falls in past yr: 0  Injury with Fall? 0   Last Audit-C alcohol use screening Alcohol Use Disorder Test (AUDIT) 01/09/2020  1. How often do you  have a drink containing alcohol? 1  2. How many drinks containing alcohol do you have on a typical day when you are drinking? 0  3. How often do you have six or more drinks on one occasion? 0  AUDIT-C Score 1  Alcohol Brief Interventions/Follow-up AUDIT Score <7 follow-up not indicated   A score of 3 or more in women, and 4 or more in men indicates increased risk for alcohol abuse, EXCEPT if all of the points are from question 1   No results found for any visits on 01/09/20.  Assessment & Plan    Routine Health Maintenance and Physical Exam  Exercise Activities and Dietary  recommendations Goals   None     Immunization History  Administered Date(s) Administered  . Influenza Split 11/15/2016  . Influenza,inj,Quad PF,6+ Mos 11/15/2016, 01/05/2019  . PFIZER SARS-COV-2 Vaccination 02/21/2019, 03/12/2019, 12/24/2019  . Tdap 04/06/2010    Health Maintenance  Topic Date Due  . Hepatitis C Screening  Never done  . INFLUENZA VACCINE  05/01/2020 (Originally 09/02/2019)  . TETANUS/TDAP  04/05/2020  . PAP SMEAR-Modifier  07/05/2021  . COVID-19 Vaccine  Completed  . HIV Screening  Completed    Discussed health benefits of physical activity, and encouraged her to engage in regular exercise appropriate for her age and condition.  1. Annual physical exam Normal physical exam today. Will check labs as below and f/u pending lab results. If labs are stable and WNL she will not need to have these rechecked for one year at her next annual physical exam. She is to call the office in the meantime if she has any acute issue, questions or concerns. - CBC with Differential/Platelet - Comprehensive metabolic panel - Lipid panel - TSH - Hemoglobin A1c - Vitamin D (25 hydroxy)  2. Class 2 severe obesity due to excess calories with serious comorbidity and body mass index (BMI) of 35.0 to 35.9 in adult [facility]) Counseled patient on healthy lifestyle modifications including dieting and exercise. Will check labs as below and f/u pending results. - CBC with Differential/Platelet - Comprehensive metabolic panel - Lipid panel - TSH - Hemoglobin A1c  3. Avitaminosis D H/O this. Will check labs as below and f/u pending results. - CBC with Differential/Platelet - Vitamin D (25 hydroxy)  4. High potassium H/O this. Will check labs as below and f/u pending results. - CBC with Differential/Platelet - Comprehensive metabolic panel  5. Encounter for hepatitis C screening test for low risk patient Will check labs as below and f/u pending results. - Hepatitis C Antibody   Return  in about 1 year (around 01/08/2021) for CPE.     Reynolds Bowl, PA-C, have reviewed all documentation for this visit. The documentation on 01/15/20 for the exam, diagnosis, procedures, and orders are all accurate and complete.   Rubye Beach  [facility] [phone] (phone) [phone] (fax)  [place]

## 2020-01-09 ENCOUNTER — Encounter: Payer: Self-pay | Admitting: Physician Assistant

## 2020-01-09 ENCOUNTER — Other Ambulatory Visit: Payer: Self-pay

## 2020-01-09 ENCOUNTER — Ambulatory Visit (INDEPENDENT_AMBULATORY_CARE_PROVIDER_SITE_OTHER): Payer: Federal, State, Local not specified - PPO | Admitting: Physician Assistant

## 2020-01-09 VITALS — BP 112/88 | HR 72 | Temp 97.7°F | Resp 16 | Ht 67.0 in | Wt 226.0 lb

## 2020-01-09 DIAGNOSIS — Z0000 Encounter for general adult medical examination without abnormal findings: Secondary | ICD-10-CM | POA: Diagnosis not present

## 2020-01-09 DIAGNOSIS — E559 Vitamin D deficiency, unspecified: Secondary | ICD-10-CM

## 2020-01-09 DIAGNOSIS — Z6835 Body mass index (BMI) 35.0-35.9, adult: Secondary | ICD-10-CM

## 2020-01-09 DIAGNOSIS — E875 Hyperkalemia: Secondary | ICD-10-CM

## 2020-01-09 DIAGNOSIS — Z1159 Encounter for screening for other viral diseases: Secondary | ICD-10-CM

## 2020-01-09 NOTE — Patient Instructions (Signed)

## 2020-01-11 DIAGNOSIS — Z6835 Body mass index (BMI) 35.0-35.9, adult: Secondary | ICD-10-CM | POA: Diagnosis not present

## 2020-01-11 DIAGNOSIS — E559 Vitamin D deficiency, unspecified: Secondary | ICD-10-CM | POA: Diagnosis not present

## 2020-01-11 DIAGNOSIS — Z0000 Encounter for general adult medical examination without abnormal findings: Secondary | ICD-10-CM | POA: Diagnosis not present

## 2020-01-12 LAB — CBC WITH DIFFERENTIAL/PLATELET
Basophils Absolute: 0.1 10*3/uL (ref 0.0–0.2)
Basos: 1 %
EOS (ABSOLUTE): 0.1 10*3/uL (ref 0.0–0.4)
Eos: 1 %
Hematocrit: 42.6 % (ref 34.0–46.6)
Hemoglobin: 14.3 g/dL (ref 11.1–15.9)
Immature Grans (Abs): 0 10*3/uL (ref 0.0–0.1)
Immature Granulocytes: 0 %
Lymphocytes Absolute: 1.4 10*3/uL (ref 0.7–3.1)
Lymphs: 33 %
MCH: 29.5 pg (ref 26.6–33.0)
MCHC: 33.6 g/dL (ref 31.5–35.7)
MCV: 88 fL (ref 79–97)
Monocytes Absolute: 0.5 10*3/uL (ref 0.1–0.9)
Monocytes: 11 %
Neutrophils Absolute: 2.3 10*3/uL (ref 1.4–7.0)
Neutrophils: 54 %
Platelets: 178 10*3/uL (ref 150–450)
RBC: 4.84 x10E6/uL (ref 3.77–5.28)
RDW: 11.8 % (ref 11.7–15.4)
WBC: 4.3 10*3/uL (ref 3.4–10.8)

## 2020-01-12 LAB — HEMOGLOBIN A1C
Est. average glucose Bld gHb Est-mCnc: 94 mg/dL
Hgb A1c MFr Bld: 4.9 % (ref 4.8–5.6)

## 2020-01-12 LAB — VITAMIN D 25 HYDROXY (VIT D DEFICIENCY, FRACTURES): Vit D, 25-Hydroxy: 46.7 ng/mL (ref 30.0–100.0)

## 2020-01-12 LAB — COMPREHENSIVE METABOLIC PANEL
ALT: 37 IU/L — ABNORMAL HIGH (ref 0–32)
AST: 29 IU/L (ref 0–40)
Albumin/Globulin Ratio: 1.7 (ref 1.2–2.2)
Albumin: 4.3 g/dL (ref 3.8–4.8)
Alkaline Phosphatase: 48 IU/L (ref 44–121)
BUN/Creatinine Ratio: 16 (ref 9–23)
BUN: 14 mg/dL (ref 6–24)
Bilirubin Total: 0.6 mg/dL (ref 0.0–1.2)
CO2: 20 mmol/L (ref 20–29)
Calcium: 9.3 mg/dL (ref 8.7–10.2)
Chloride: 102 mmol/L (ref 96–106)
Creatinine, Ser: 0.89 mg/dL (ref 0.57–1.00)
GFR calc Af Amer: 89 mL/min/{1.73_m2} (ref >59)
GFR calc non Af Amer: 77 mL/min/{1.73_m2} (ref >59)
Globulin, Total: 2.6 g/dL (ref 1.5–4.5)
Glucose: 83 mg/dL (ref 65–99)
Potassium: 4.4 mmol/L (ref 3.5–5.2)
Sodium: 137 mmol/L (ref 134–144)
Total Protein: 6.9 g/dL (ref 6.0–8.5)

## 2020-01-12 LAB — LIPID PANEL
Chol/HDL Ratio: 3.1 ratio (ref 0.0–4.4)
Cholesterol, Total: 150 mg/dL (ref 100–199)
HDL: 49 mg/dL (ref >39)
LDL Chol Calc (NIH): 87 mg/dL (ref 0–99)
Triglycerides: 69 mg/dL (ref 0–149)
VLDL Cholesterol Cal: 14 mg/dL (ref 5–40)

## 2020-01-12 LAB — TSH: TSH: 2.76 u[IU]/mL (ref 0.450–4.500)

## 2020-01-12 LAB — HEPATITIS C ANTIBODY: Hep C Virus Ab: <0.1 s/co ratio (ref 0.0–0.9)

## 2020-01-15 ENCOUNTER — Encounter: Payer: Self-pay | Admitting: Physician Assistant

## 2020-02-19 ENCOUNTER — Encounter: Payer: Self-pay | Admitting: Physician Assistant

## 2020-03-10 ENCOUNTER — Encounter: Payer: Self-pay | Admitting: Physician Assistant

## 2020-03-10 DIAGNOSIS — E559 Vitamin D deficiency, unspecified: Secondary | ICD-10-CM

## 2020-03-10 DIAGNOSIS — R7401 Elevation of levels of liver transaminase levels: Secondary | ICD-10-CM

## 2020-03-10 MED ORDER — PROCTOFOAM HC 1-1 % EX FOAM: 1.0000 | Freq: Two times a day (BID) | CUTANEOUS | 0 refills | Status: DC

## 2020-03-17 DIAGNOSIS — E559 Vitamin D deficiency, unspecified: Secondary | ICD-10-CM | POA: Diagnosis not present

## 2020-03-17 DIAGNOSIS — R7401 Elevation of levels of liver transaminase levels: Secondary | ICD-10-CM | POA: Diagnosis not present

## 2020-03-18 LAB — VITAMIN D 25 HYDROXY (VIT D DEFICIENCY, FRACTURES): Vit D, 25-Hydroxy: 42.6 ng/mL (ref 30.0–100.0)

## 2020-03-18 LAB — HEPATIC FUNCTION PANEL
ALT: 28 IU/L (ref 0–32)
AST: 26 IU/L (ref 0–40)
Albumin: 4.2 g/dL (ref 3.8–4.8)
Alkaline Phosphatase: 53 IU/L (ref 44–121)
Bilirubin Total: 0.3 mg/dL (ref 0.0–1.2)
Bilirubin, Direct: <0.1 mg/dL (ref 0.00–0.40)
Total Protein: 6.9 g/dL (ref 6.0–8.5)

## 2020-03-20 ENCOUNTER — Ambulatory Visit: Payer: Self-pay | Admitting: *Deleted

## 2020-03-20 NOTE — Telephone Encounter (Signed)
Pt calling on covid line for information related to covid exposure. States her son has visit with father planned, fathers GF tested positive for covid. Does not know time frame, when resulted positive. States she will find out additional information and CB if needed.   Answer Assessment - Initial Assessment Questions 1. REASON FOR CALL or QUESTION: "What is your reason for calling today?" or "How can I best help you?" or "What question do you have that I can help answer?"     Covid exposure  Protocols used: INFORMATION ONLY CALL - NO TRIAGE-A-AH

## 2020-06-02 DIAGNOSIS — D485 Neoplasm of uncertain behavior of skin: Secondary | ICD-10-CM | POA: Diagnosis not present

## 2020-06-02 DIAGNOSIS — Z85828 Personal history of other malignant neoplasm of skin: Secondary | ICD-10-CM | POA: Diagnosis not present

## 2020-06-02 DIAGNOSIS — D225 Melanocytic nevi of trunk: Secondary | ICD-10-CM | POA: Diagnosis not present

## 2020-06-02 DIAGNOSIS — L578 Other skin changes due to chronic exposure to nonionizing radiation: Secondary | ICD-10-CM | POA: Diagnosis not present

## 2020-06-02 DIAGNOSIS — Z86018 Personal history of other benign neoplasm: Secondary | ICD-10-CM | POA: Diagnosis not present

## 2020-06-24 DIAGNOSIS — D225 Melanocytic nevi of trunk: Secondary | ICD-10-CM | POA: Diagnosis not present

## 2020-06-24 DIAGNOSIS — D235 Other benign neoplasm of skin of trunk: Secondary | ICD-10-CM | POA: Diagnosis not present

## 2020-07-02 ENCOUNTER — Encounter: Payer: Self-pay | Admitting: Obstetrics and Gynecology

## 2020-07-02 ENCOUNTER — Other Ambulatory Visit: Payer: Self-pay

## 2020-07-02 ENCOUNTER — Ambulatory Visit: Payer: Federal, State, Local not specified - PPO | Admitting: Obstetrics and Gynecology

## 2020-07-02 VITALS — BP 108/71 | HR 86 | Ht 67.0 in | Wt 190.0 lb

## 2020-07-02 DIAGNOSIS — R102 Pelvic and perineal pain: Secondary | ICD-10-CM

## 2020-07-02 DIAGNOSIS — N939 Abnormal uterine and vaginal bleeding, unspecified: Secondary | ICD-10-CM

## 2020-07-02 DIAGNOSIS — N809 Endometriosis, unspecified: Secondary | ICD-10-CM | POA: Diagnosis not present

## 2020-07-02 MED ORDER — OXYCODONE-ACETAMINOPHEN 5-325 MG PO TABS: 1.0000 | ORAL_TABLET | ORAL | 0 refills | Status: DC | PRN

## 2020-07-02 NOTE — Progress Notes (Signed)
Gynecology Pelvic Pain Evaluation   Chief Complaint:  Chief Complaint  Patient presents with  . Abdominal Pain    X4 days. Started Sunday am. Woke up w/pain. Right side. Now radiates across stomach. H/O ruptured cyst.    History of Present Illness:   Patient is a 50 y.o. G2P2 who LMP was No LMP recorded. Patient has had an ablation., presents today for a problem visit.  She complains of pelvic pain.   Her pain is localized to the midline to lower right quadrant area, described as constant, sharp and stabbing, began several days ago and its severity is described as severe. The pain radiates to the  Non-radiating. She has these associated symptoms which include vaginal bleeding. Patient has these modifiers which include relaxation that make it better and moving that make it worse.  Has had prior cystectomy with mild stage II endometriosis noted at that time.  History of prior ablation with failure and resumption of menstruation and dysmenorrhea.  Last evaluation 10/19/2018 at time of cholecystectomy showing some adhesive disease left lower quadrant as well as a thicker adhesion of the sigmoid colon to the uterus.    Review of Systems: Review of Systems  Constitutional: Negative.   Gastrointestinal: Positive for abdominal pain. Negative for blood in stool, constipation, diarrhea, melena, nausea and vomiting.  Genitourinary: Negative.     Past Medical History:  Patient Active Problem List   Diagnosis Date Noted  . Leg pain 01/31/2019  . Lymphedema 01/20/2019  . 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 Lipedema 01/10/2019  . Endometriosis determined by laparoscopy 07/06/2018    Stage II 01/26/2013 with both ovarian fossas involved, posterior cul de sac peritoneal window   . Atypical chest pain 08/26/2017  . Pruritus ani 08/24/2016  . History of rectal bleeding 08/24/2016  . Obesity (BMI 30-39.9) 11/20/2014  . Dermatitis, eczematoid 11/20/2014  . External hemorrhoid 11/20/2014  . High  potassium 11/20/2014  . Bursitis 11/20/2014  . Migraine 08/28/2014  . Gallbladder polyp 09/13/2012  . Avitaminosis D 05/19/2009    Past Surgical History:  Past Surgical History:  Procedure Laterality Date  . ABLATION  2012   cyst removal  . CHOLECYSTECTOMY N/A 10/19/2018   Procedure: LAPAROSCOPIC CHOLECYSTECTOMY;  Surgeon: Jules Husbands, MD;  Location: [facility] ORS;  Service: General;  Laterality: N/A;  . LAPAROSCOPIC OVARIAN CYSTECTOMY Right 10/19/2018   Procedure: LAPAROSCOPIC RIGHT OVARIAN CYSTECTOMY;  Surgeon: Malachy Mood, MD;  Location: [facility] ORS;  Service: Gynecology;  Laterality: Right;  . NECK SURGERY  02/22/2018   Artificial Disc replacement   . OVARIAN CYST REMOVAL      Gynecologic History:  No LMP recorded. Patient has had an ablation.   Obstetric History: G2P2  Family History:  Family History  Problem Relation Age of Onset  . Lung cancer Father   . Diabetes Mother   . Stroke Mother   . Diabetes Maternal Grandmother   . Colon cancer Neg Hx   . Breast cancer Neg Hx     Social History:  Social History   Socioeconomic History  . Marital status: Married    Spouse name: Not on file  . Number of children: Not on file  . Years of education: Not on file  . Highest education level: Not on file  Occupational History  . Not on file  Tobacco Use  . Smoking status: Former Smoker    Types: Cigarettes    Quit date: 02/01/1997    Years since quitting: 23.4  . Smokeless  tobacco: Never Used  Vaping Use  . Vaping Use: Never used  Substance and Sexual Activity  . Alcohol use: Yes    Comment: occassional   . Drug use: No  . Sexual activity: Yes    Birth control/protection: None  Other Topics Concern  . Not on file  Social History Narrative  . Not on file   Social Determinants of Health   Financial Resource Strain: Not on file  Food Insecurity: Not on file  Transportation Needs: Not on file  Physical Activity: Not on file  Stress: Not on file  Social  Connections: Not on file  Intimate Partner Violence: Not on file    Allergies:  No Known Allergies  Medications: Prior to Admission medications   Medication Sig Start Date End Date Taking? Authorizing Provider  clonazePAM (KLONOPIN) 0.5 MG tablet TAKE 1 TABLET BY MOUTH TWICE DAILY AS NEEDED FOR ANXIETY OR PALPITATIONS 04/16/19  Yes Burnette, Anderson Malta M, PA-C  hydrocortisone-pramoxine (PROCTOFOAM HC) rectal foam Place 1 applicator rectally 2 (two) times daily. 03/10/20  Yes Mar Daring, PA-C  meclizine (ANTIVERT) 25 MG tablet Take 1 tablet (25 mg total) by mouth 3 (three) times daily as needed for dizziness. 07/14/18  Yes Mar Daring, PA-C  SUMAtriptan (IMITREX) 50 MG tablet Take 1 tablet (50 mg total) by mouth every 2 (two) hours as needed for migraine. May repeat in 2 hours if headache persists or recurs. 01/01/20  Yes Mar Daring, PA-C  cholecalciferol (VITAMIN D3) 25 MCG (1000 UT) tablet Take 1,000 Units by mouth daily. Patient not taking: Reported on 07/02/2020    [provider]  fluticasone (FLONASE) 50 MCG/ACT nasal spray USE 1 SPRAY IN EACH NOSTRIL TWICE DAILY Patient not taking: Reported on 07/02/2020 04/18/18   Olen Cordial, NP  Multiple Vitamin (MULTIVITAMIN WITH MINERALS) TABS tablet Take 1 tablet by mouth daily. Patient not taking: Reported on 07/02/2020    [provider]  promethazine (PHENERGAN) 25 MG tablet Take 1 tablet (25 mg total) by mouth every 8 (eight) hours as needed for nausea or vomiting. Patient not taking: Reported on 07/02/2020 09/05/18   Kennyth Arnold, FNP    Physical Exam Vitals: Blood pressure 108/71, pulse 86, height 5\' 7"  (1.702 m), weight 190 lb (86.2 kg).  General: NAD HEENT: normocephalic, anicteric Pulmonary: No increased work of breathing Abdomen: soft, non-tender, non-distended.  Umbilicus without lesions.  No hepatomegaly, splenomegaly or masses palpable. No evidence of hernia  Genitourinary:  External:  Normal external female genitalia.  Normal urethral meatus, normal  Bartholin's and Skene's glands.    Vagina: Normal vaginal mucosa, no evidence of prolapse.    Cervix: Grossly normal in appearance, mild bleeding  Uterus: Non-enlarged, mobile, normal contour.  No CMT, mild tenderness midline and RLQ  Adnexa: ovaries non-enlarged, no adnexal masses  Rectal: deferred  Lymphatic: no evidence of inguinal lymphadenopathy Extremities: no edema, erythema, or tenderness Neurologic: Grossly intact Psychiatric: mood appropriate, affect full  Female chaperone present for pelvic portion of the physical exam  Assessment: 50 y.o. G2P2 with chronic pelvic pain, acute exacerbation suspected secondary to obstructed menstruation from failed ablation and stage II endometriosis   Problem List Items Addressed This Visit      Other   Endometriosis determined by laparoscopy    Other Visit Diagnoses    Abnormal uterine bleeding    -  Primary   Relevant Orders   US PELVIS TRANSVAGINAL NON-OB (TV ONLY)   CBC (Completed)   Pelvic pain  Relevant Orders   US PELVIS TRANSVAGINAL NON-OB (TV ONLY)   CBC (Completed)       1) We discussed the possible etiologies for pelvic pain in women.  Gynecologic causes may include endometriosis, adenomyosis, pelvic inflammatory disease (PID), ovarian cysts, ovarian or tubal torsion, and in rare case gynecologic malignancy such as cervical, uterine, or ovarian cancer.  In addition thee possibility of non-gynecologic etiologies such as urinary or GI tract pathology or disordered, as well as musculoskeletal problems.  The goal is to complete a basic work up in hopes of identifying the underlying cause which in turn will dictate treatment.  In the meantime supportive measures such as localized heat, and NSAIDs are reasonable first steps.     - Prescription drug database was reviewed, UDS was not ordered - Transvaginal ultrasound ordered - Blood work obtained today Yes  -  Cervical cultures No  Biggest concern is for possible hematometria secondary to failed ablation.  Will evaluate for that and endometrioma with [place].  If hematometria consider hysterectomy.  Only other option would be trial of orilissa.  Percocet Rx for acute pain.    2) Return follow up after [place] imaging results.   Malachy Mood, MD, [facility], [facility] 07/02/2020, 2:44 PM

## 2020-07-03 LAB — CBC
Hematocrit: 40.5 % (ref 34.0–46.6)
Hemoglobin: 13.3 g/dL (ref 11.1–15.9)
MCH: 29.5 pg (ref 26.6–33.0)
MCHC: 32.8 g/dL (ref 31.5–35.7)
MCV: 90 fL (ref 79–97)
Platelets: 179 10*3/uL (ref 150–450)
RBC: 4.51 x10E6/uL (ref 3.77–5.28)
RDW: 11.8 % (ref 11.7–15.4)
WBC: 8.6 10*3/uL (ref 3.4–10.8)

## 2020-07-14 ENCOUNTER — Ambulatory Visit (INDEPENDENT_AMBULATORY_CARE_PROVIDER_SITE_OTHER): Payer: Federal, State, Local not specified - PPO | Admitting: Obstetrics and Gynecology

## 2020-07-14 ENCOUNTER — Other Ambulatory Visit (HOSPITAL_COMMUNITY)
Admission: RE | Admit: 2020-07-14 | Discharge: 2020-07-14 | Disposition: A | Discharge status: Home / Self Care | Payer: Federal, State, Local not specified - PPO | Source: Ambulatory Visit | Attending: Obstetrics and Gynecology | Admitting: Obstetrics and Gynecology

## 2020-07-14 ENCOUNTER — Other Ambulatory Visit: Payer: Self-pay

## 2020-07-14 ENCOUNTER — Encounter: Payer: Self-pay | Admitting: Obstetrics and Gynecology

## 2020-07-14 VITALS — BP 122/78 | HR 67 | Ht 67.0 in | Wt 191.0 lb

## 2020-07-14 DIAGNOSIS — Z01419 Encounter for gynecological examination (general) (routine) without abnormal findings: Secondary | ICD-10-CM

## 2020-07-14 DIAGNOSIS — Z124 Encounter for screening for malignant neoplasm of cervix: Secondary | ICD-10-CM | POA: Insufficient documentation

## 2020-07-14 DIAGNOSIS — Z1239 Encounter for other screening for malignant neoplasm of breast: Secondary | ICD-10-CM | POA: Diagnosis not present

## 2020-07-14 DIAGNOSIS — Z1211 Encounter for screening for malignant neoplasm of colon: Secondary | ICD-10-CM

## 2020-07-14 NOTE — Patient Instructions (Signed)
[facility] [address] [place] [address]  [facility]  [address] [place] [address]  Phone: [phone]  

## 2020-07-14 NOTE — Progress Notes (Signed)
Gynecology Annual Exam  PCP: Mar Daring, PA-C  Chief Complaint:  Chief Complaint  Patient presents with   Gynecologic Exam    Annual - RM 5    History of Present Illness: Patient is a 50 y.o. G2P2 presents for annual exam. The patient has no complaints today.   LMP: No LMP recorded. Patient has had an ablation. Still having irregular breakthrough bleeding and pelvic pain with recent acute exacerbation of pelvic pain beyond previously noted dysmenorrhea.  Ultrasound normal with no evidence of hematometra but some fundal fluid collection with EMS of 0.17cm, ovaries imaged normally other than a 4.3 x 3.0 x 3.7cm complex right ovarian mass which may represent a hemorrhagic corpus luteum cyst vs endometrioma.  This was evaluated at time of cholecystomy 10/19/2018 (The right ovary was normal in appearance.  The left ovary had some adhesive disease to the left pelvic sidewall.  There was one thicker adhesion of the uterus to the sigmoid colon.  No evidence of active disease in the cule de sac or ovarian fossa.  Clear anterior cul de sac.) with previously noted stage II endometriosis at time of diagnostic laparoscopy 01/28/2013.  If the current cyst is the same cyst it has increased in dimension from 1.3 x 1.2 x 1.1 cm on 08/01/2018 imaging.  Pain has improved since last week.   The patient is sexually active. She admits to dyspareunia.  The patient does perform self breast exams.  There is no notable family history of breast or ovarian cancer in her family.  The patient wears seatbelts: yes.   The patient has regular exercise: not asked.    The patient denies current symptoms of depression.    Review of Systems: Review of Systems  Constitutional: Negative.   Respiratory: Negative.    Cardiovascular: Negative.   Gastrointestinal:  Positive for abdominal pain. Negative for blood in stool, constipation, diarrhea, heartburn, melena, nausea and vomiting.  Genitourinary: Negative.    Neurological: Negative.   Endo/Heme/Allergies: Negative.   Psychiatric/Behavioral: Negative.     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   Lipedema 01/10/2019   Endometriosis determined by laparoscopy 07/06/2018    Stage II 01/26/2013 with both ovarian fossas involved, posterior cul de sac peritoneal window     Atypical chest pain 08/26/2017   Pruritus ani 08/24/2016   History of rectal bleeding 08/24/2016   Obesity (BMI 30-39.9) 11/20/2014   Dermatitis, eczematoid 11/20/2014   External hemorrhoid 11/20/2014   High potassium 11/20/2014   Bursitis 11/20/2014   Migraine 08/28/2014   Gallbladder polyp 09/13/2012   Avitaminosis D 05/19/2009    Past Surgical History:  Past Surgical History:  Procedure Laterality Date   ABLATION  2012   cyst removal   CHOLECYSTECTOMY N/A 10/19/2018   Procedure: LAPAROSCOPIC CHOLECYSTECTOMY;  Surgeon: Jules Husbands, MD;  Location: [facility] ORS;  Service: General;  Laterality: N/A;   LAPAROSCOPIC OVARIAN CYSTECTOMY Right 10/19/2018   Procedure: LAPAROSCOPIC RIGHT OVARIAN CYSTECTOMY;  Surgeon: Malachy Mood, MD;  Location: [facility] ORS;  Service: Gynecology;  Laterality: Right;   NECK SURGERY  02/22/2018   Artificial Disc replacement    OVARIAN CYST REMOVAL      Gynecologic History:  No LMP recorded. Patient has had an ablation. Last Pap: Results were: 07/06/2018 NIL and HR HPV negative  Last mammogram: 10/04/2019 Results were: BI-RAD I  Obstetric History: G2P2  Family History:  Family History  Problem Relation Age of Onset   Lung cancer Father    Diabetes Mother    Stroke Mother    Diabetes Maternal Grandmother    Colon cancer Neg Hx    Breast cancer Neg Hx     Social History:  Social History   Socioeconomic History   Marital status: Married    Spouse name: Not on file   Number of children: Not on file   Years of education:  Not on file   Highest education level: Not on file  Occupational History   Not on file  Tobacco Use   Smoking status: Former    Pack years: 0.00    Types: Cigarettes    Quit date: 02/01/1997    Years since quitting: 23.4   Smokeless tobacco: Never  Vaping Use   Vaping Use: Never used  Substance and Sexual Activity   Alcohol use: Yes    Comment: occassional    Drug use: No   Sexual activity: Yes    Birth control/protection: None  Other Topics Concern   Not on file  Social History Narrative   Not on file   Social Determinants of Health   Financial Resource Strain: Not on file  Food Insecurity: Not on file  Transportation Needs: Not on file  Physical Activity: Not on file  Stress: Not on file  Social Connections: Not on file  Intimate Partner Violence: Not on file    Allergies:  No Known Allergies  Medications: Prior to Admission medications   Medication Sig Start Date End Date Taking? Authorizing Provider  SUMAtriptan (IMITREX) 50 MG tablet Take 1 tablet (50 mg total) by mouth every 2 (two) hours as needed for migraine. May repeat in 2 hours if headache persists or recurs. 01/01/20  Yes Mar Daring, PA-C  cholecalciferol (VITAMIN D3) 25 MCG (1000 UT) tablet Take 1,000 Units by mouth daily. Patient not taking: Reported on 07/02/2020    [provider]  clonazePAM (KLONOPIN) 0.5 MG tablet TAKE 1 TABLET BY MOUTH TWICE DAILY AS NEEDED FOR ANXIETY OR PALPITATIONS 04/16/19   Fenton Malling M, PA-C  fluticasone [facility]) 50 MCG/ACT nasal spray USE 1 SPRAY IN EACH NOSTRIL TWICE DAILY Patient not taking: Reported on 07/02/2020 04/18/18   Betancourt, Aura Fey, NP  hydrocortisone-pramoxine (PROCTOFOAM HC) rectal foam Place 1 applicator rectally 2 (two) times daily. Patient not taking: Reported on 07/14/2020 03/10/20   Mar Daring, PA-C  meclizine (ANTIVERT) 25 MG tablet Take 1 tablet (25 mg total) by mouth 3 (three) times daily as needed for dizziness. 07/14/18    Mar Daring, PA-C  Multiple Vitamin (MULTIVITAMIN WITH MINERALS) TABS tablet Take 1 tablet by mouth daily. Patient not taking: Reported on 07/02/2020    [provider]  oxyCODONE-acetaminophen (PERCOCET/ROXICET) 5-325 MG tablet Take 1 tablet by mouth every 4 (four) hours as needed for severe pain. Patient not taking: Reported on 07/14/2020 07/02/20   Malachy Mood, MD  promethazine (PHENERGAN) 25 MG tablet Take 1 tablet (25 mg total) by mouth every 8 (eight) hours as needed for nausea or vomiting. Patient not taking: Reported on 07/02/2020 09/05/18   Kennyth Arnold, FNP    Physical Exam Vitals: Blood pressure 122/78, pulse 67, height 5\' 7"  (1.702 m), weight 191 lb (86.6 kg).  General: NAD HEENT: normocephalic, anicteric Thyroid: no enlargement, no palpable nodules Pulmonary: No increased work of breathing, CTAB Cardiovascular: RRR, distal pulses 2+ Breast: Breast symmetrical, no tenderness, no palpable  nodules or masses, no skin or nipple retraction present, no nipple discharge.  No axillary or supraclavicular lymphadenopathy. Abdomen: NABS, soft, non-tender, non-distended.  Umbilicus without lesions.  No hepatomegaly, splenomegaly or masses palpable. No evidence of hernia  Genitourinary:  External: Normal external female genitalia.  Normal urethral meatus, normal Bartholin's and Skene's glands.    Vagina: Normal vaginal mucosa, no evidence of prolapse.    Cervix: Grossly normal in appearance, no bleeding  Uterus: Non-enlarged, mobile, normal contour.  No CMT  Adnexa: ovaries non-enlarged, no adnexal masses  Rectal: deferred  Lymphatic: no evidence of inguinal lymphadenopathy Extremities: no edema, erythema, or tenderness Neurologic: Grossly intact Psychiatric: mood appropriate, affect full  Female chaperone present for pelvic and breast  portions of the physical exam    Assessment: 50 y.o. G2P2 routine annual exam  Plan: Problem List Items Addressed This Visit        Other   Colon cancer screening   Relevant Orders   Ambulatory referral to Gastroenterology   Other Visit Diagnoses     Encounter for gynecological examination without abnormal finding    -  Primary   Screening for malignant neoplasm of cervix       Relevant Orders   Cytology - PAP (Completed)   Breast screening       Relevant Orders   MM 3D SCREEN BREAST BILATERAL       1) Mammogram - recommend yearly screening mammogram.  Mammogram Was ordered today   2) STI screening  was notoffered and therefore not obtained  3) ASCCP guidelines and rational discussed.  Patient opts for every 3 years screening interval  4) Contraception - the patient is currently using  vasectomy.  She is happy with her current form of contraception and plans to continue  5) Colonoscopy -- Screening recommended starting at age 31 for average risk individuals, age 22 for individuals deemed at increased risk (including African Americans) and recommended to continue until age 58.  For patient age 7-85 individualized approach is recommended.  Gold standard screening is via colonoscopy, Cologuard screening is an acceptable alternative for patient unwilling or unable to undergo colonoscopy.  "Colorectal cancer screening for average?risk adults: 2018 guideline update from the [place]: A Cancer Journal for Clinicians: Jun 30, 2016   6) Routine healthcare maintenance including cholesterol, diabetes screening discussed managed by PCPg  7) Return follow up phone visit after [place] imaging results (6/15).   Malachy Mood, MD, [facility], [facility] 07/14/2020, 3:53 PM

## 2020-07-15 ENCOUNTER — Other Ambulatory Visit: Payer: Self-pay

## 2020-07-16 ENCOUNTER — Encounter: Payer: Self-pay | Admitting: Gastroenterology

## 2020-07-16 ENCOUNTER — Other Ambulatory Visit: Payer: Self-pay

## 2020-07-16 DIAGNOSIS — D251 Intramural leiomyoma of uterus: Secondary | ICD-10-CM | POA: Diagnosis not present

## 2020-07-16 DIAGNOSIS — Z1211 Encounter for screening for malignant neoplasm of colon: Secondary | ICD-10-CM

## 2020-07-16 DIAGNOSIS — N939 Abnormal uterine and vaginal bleeding, unspecified: Secondary | ICD-10-CM | POA: Diagnosis not present

## 2020-07-16 DIAGNOSIS — R102 Pelvic and perineal pain: Secondary | ICD-10-CM | POA: Diagnosis not present

## 2020-07-16 LAB — CYTOLOGY - PAP
Adequacy: ABSENT
Comment: NEGATIVE
Diagnosis: NEGATIVE
High risk HPV: NEGATIVE

## 2020-07-16 MED ORDER — PEG 3350-KCL-NA BICARB-NACL 420 G PO SOLR: ORAL | 0 refills | Status: DC

## 2020-07-21 ENCOUNTER — Ambulatory Visit: Payer: Federal, State, Local not specified - PPO | Admitting: Anesthesiology

## 2020-07-21 ENCOUNTER — Encounter: Payer: Self-pay | Admitting: Gastroenterology

## 2020-07-21 ENCOUNTER — Other Ambulatory Visit: Payer: Self-pay

## 2020-07-21 ENCOUNTER — Encounter
Admission: RE | Disposition: A | Discharge status: Home / Self Care | Payer: Self-pay | Source: Home / Self Care | Attending: Gastroenterology

## 2020-07-21 ENCOUNTER — Ambulatory Visit
Admission: RE | Admit: 2020-07-21 | Discharge: 2020-07-21 | Disposition: A | Discharge status: Home / Self Care | Payer: Federal, State, Local not specified - PPO | Attending: Gastroenterology | Admitting: Gastroenterology

## 2020-07-21 DIAGNOSIS — Z87891 Personal history of nicotine dependence: Secondary | ICD-10-CM | POA: Insufficient documentation

## 2020-07-21 DIAGNOSIS — D124 Benign neoplasm of descending colon: Secondary | ICD-10-CM | POA: Diagnosis not present

## 2020-07-21 DIAGNOSIS — Z823 Family history of stroke: Secondary | ICD-10-CM | POA: Insufficient documentation

## 2020-07-21 DIAGNOSIS — D120 Benign neoplasm of cecum: Secondary | ICD-10-CM | POA: Diagnosis not present

## 2020-07-21 DIAGNOSIS — D126 Benign neoplasm of colon, unspecified: Secondary | ICD-10-CM | POA: Diagnosis not present

## 2020-07-21 DIAGNOSIS — K641 Second degree hemorrhoids: Secondary | ICD-10-CM | POA: Insufficient documentation

## 2020-07-21 DIAGNOSIS — Z79899 Other long term (current) drug therapy: Secondary | ICD-10-CM | POA: Diagnosis not present

## 2020-07-21 DIAGNOSIS — Z1211 Encounter for screening for malignant neoplasm of colon: Secondary | ICD-10-CM | POA: Diagnosis not present

## 2020-07-21 DIAGNOSIS — Z801 Family history of malignant neoplasm of trachea, bronchus and lung: Secondary | ICD-10-CM | POA: Diagnosis not present

## 2020-07-21 DIAGNOSIS — K635 Polyp of colon: Secondary | ICD-10-CM

## 2020-07-21 DIAGNOSIS — Z833 Family history of diabetes mellitus: Secondary | ICD-10-CM | POA: Insufficient documentation

## 2020-07-21 HISTORY — DX: Other specified postprocedural states: R11.2

## 2020-07-21 HISTORY — DX: Nausea with vomiting, unspecified: R11.2

## 2020-07-21 HISTORY — DX: Migraine, unspecified, not intractable, without status migrainosus: G43.909

## 2020-07-21 HISTORY — DX: Other specified postprocedural states: Z98.890

## 2020-07-21 HISTORY — PX: COLONOSCOPY WITH PROPOFOL: SHX5780

## 2020-07-21 HISTORY — PX: POLYPECTOMY: SHX5525

## 2020-07-21 HISTORY — DX: Dizziness and giddiness: R42

## 2020-07-21 LAB — POCT PREGNANCY, URINE: Preg Test, Ur: NEGATIVE

## 2020-07-21 SURGERY — COLONOSCOPY WITH PROPOFOL
Anesthesia: General | Site: Rectum

## 2020-07-21 MED ORDER — PROPOFOL 10 MG/ML IV BOLUS: INTRAVENOUS | Status: DC | PRN

## 2020-07-21 MED ORDER — LIDOCAINE HCL (CARDIAC) PF 100 MG/5ML IV SOSY
PREFILLED_SYRINGE | INTRAVENOUS | Status: DC | PRN
  Administered 2020-07-21: 60 mg via INTRAVENOUS

## 2020-07-21 MED ORDER — STERILE WATER FOR IRRIGATION IR SOLN
Status: DC | PRN
  Administered 2020-07-21: 150 mL

## 2020-07-21 MED ORDER — PROPOFOL 10 MG/ML IV BOLUS
INTRAVENOUS | Status: DC | PRN
  Administered 2020-07-21: 20 mg via INTRAVENOUS
  Administered 2020-07-21 (×3): 30 mg via INTRAVENOUS
  Administered 2020-07-21: 80 mg via INTRAVENOUS
  Administered 2020-07-21 (×2): 30 mg via INTRAVENOUS

## 2020-07-21 MED ORDER — SODIUM CHLORIDE 0.9 % IV SOLN: INTRAVENOUS | Status: DC

## 2020-07-21 MED ORDER — LACTATED RINGERS IV SOLN: INTRAVENOUS | Status: DC

## 2020-07-21 SURGICAL SUPPLY — 22 items
CLIP HMST 235XBRD CATH ROT (MISCELLANEOUS) IMPLANT
CLIP RESOLUTION 360 11X235 (MISCELLANEOUS)
ELECT REM PT RETURN 9FT ADLT (ELECTROSURGICAL)
ELECTRODE REM PT RTRN 9FT ADLT (ELECTROSURGICAL) IMPLANT
FORCEPS BIOP RAD 4 LRG CAP 4 (CUTTING FORCEPS) ×2 IMPLANT
GOWN CVR UNV OPN BCK APRN NK (MISCELLANEOUS) ×2 IMPLANT
GOWN ISOL THUMB LOOP REG UNIV (MISCELLANEOUS) ×4
INJECTOR VARIJECT VIN23 (MISCELLANEOUS) IMPLANT
KIT DEFENDO VALVE AND CONN (KITS) IMPLANT
KIT PRC NS LF DISP ENDO (KITS) ×1 IMPLANT
KIT PROCEDURE OLYMPUS (KITS) ×2
MANIFOLD NEPTUNE II (INSTRUMENTS) ×2 IMPLANT
MARKER SPOT ENDO TATTOO 5ML (MISCELLANEOUS) IMPLANT
PROBE APC STR FIRE (PROBE) IMPLANT
RETRIEVER NET ROTH 2.5X230 LF (MISCELLANEOUS) IMPLANT
SNARE COLD EXACTO (MISCELLANEOUS) ×2 IMPLANT
SNARE SHORT THROW 13M SML OVAL (MISCELLANEOUS) IMPLANT
SNARE SNG USE RND 15MM (INSTRUMENTS) IMPLANT
SPOT EX ENDOSCOPIC TATTOO (MISCELLANEOUS)
TRAP ETRAP POLY (MISCELLANEOUS) ×2 IMPLANT
VARIJECT INJECTOR VIN23 (MISCELLANEOUS)
WATER STERILE IRR 250ML POUR (IV SOLUTION) ×2 IMPLANT

## 2020-07-21 NOTE — H&P (Signed)
Norma Lame, MD [facility] [address], [place], [place] [address] Phone: [phone] Fax : [phone]  Primary Care Physician:  [place] Primary Gastroenterologist:  Dr. Allen Norris  Pre-Procedure History & Physical: HPI:  Norma Wall is a 50 y.o. female is here for a screening colonoscopy.   Past Medical History:  Diagnosis Date   Abdominal pain, right upper quadrant 2013   Anxiety    Endometriosis 01/2013   Gallbladder polyp    Lipidemia 2020   Lymphedema 01/2019   Migraine headache    3-4x/yr   Ovarian cyst 2014   right ovary   PONV (postoperative nausea and vomiting)    Vertigo    resolves quickly    Past Surgical History:  Procedure Laterality Date   ABLATION  2012   cyst removal   CHOLECYSTECTOMY N/A 10/19/2018   Procedure: LAPAROSCOPIC CHOLECYSTECTOMY;  Surgeon: Jules Husbands, MD;  Location: [facility] ORS;  Service: General;  Laterality: N/A;   LAPAROSCOPIC OVARIAN CYSTECTOMY Right 10/19/2018   Procedure: LAPAROSCOPIC RIGHT OVARIAN CYSTECTOMY;  Surgeon: Malachy Mood, MD;  Location: [facility] ORS;  Service: Gynecology;  Laterality: Right;   NECK SURGERY  02/22/2018   Artificial Disc replacement    OVARIAN CYST REMOVAL      Prior to Admission medications   Medication Sig Start Date End Date Taking? Authorizing Provider  clonazePAM (KLONOPIN) 0.5 MG tablet TAKE 1 TABLET BY MOUTH TWICE DAILY AS NEEDED FOR ANXIETY OR PALPITATIONS 04/16/19  Yes Burnette, Anderson Malta M, PA-C  polyethylene glycol-electrolytes (GAVILYTE-N WITH FLAVOR PACK) 420 g solution Drink one 8 oz glass every 20 mins until entire container is finished starting at 5:00pm on 07/20/20 07/16/20  Yes Norma Lame, MD  SUMAtriptan (IMITREX) 50 MG tablet Take 1 tablet (50 mg total) by mouth every 2 (two) hours as needed for migraine. May repeat in 2 hours if headache persists or recurs. 01/01/20  Yes Mar Daring, PA-C  oxyCODONE-acetaminophen (PERCOCET/ROXICET) 5-325 MG tablet  Take 1 tablet by mouth every 4 (four) hours as needed for severe pain. Patient not taking: No sig reported 07/02/20   Malachy Mood, MD    Allergies as of 07/16/2020   (No Known Allergies)    Family History  Problem Relation Age of Onset   Lung cancer Father    Diabetes Mother    Stroke Mother    Diabetes Maternal Grandmother    Colon cancer Neg Hx    Breast cancer Neg Hx     Social History   Socioeconomic History   Marital status: Married    Spouse name: Not on file   Number of children: Not on file   Years of education: Not on file   Highest education level: Not on file  Occupational History   Not on file  Tobacco Use   Smoking status: Former    Pack years: 0.00    Types: Cigarettes    Quit date: 02/01/1997    Years since quitting: 23.4   Smokeless tobacco: Never  Vaping Use   Vaping Use: Never used  Substance and Sexual Activity   Alcohol use: Yes    Comment: occassional    Drug use: No   Sexual activity: Yes    Birth control/protection: None  Other Topics Concern   Not on file  Social History Narrative   Not on file   Social Determinants of Health   Financial Resource Strain: Not on file  Food Insecurity: Not on file  Transportation Needs: Not on  file  Physical Activity: Not on file  Stress: Not on file  Social Connections: Not on file  Intimate Partner Violence: Not on file    Review of Systems: See HPI, otherwise negative ROS  Physical Exam: BP 107/69   Pulse 70   Temp (!) 97 F (36.1 C) (Temporal)   Resp 20   Ht 5\' 7"  (1.702 m)   Wt 85.5 kg   LMP 07/02/2020 (Exact Date)   SpO2 96%   BMI 29.52 kg/m  General:   Alert,  pleasant and cooperative in NAD Head:  Normocephalic and atraumatic. Neck:  Supple; no masses or thyromegaly. Lungs:  Clear throughout to auscultation.    Heart:  Regular rate and rhythm. Abdomen:  Soft, nontender and nondistended. Normal bowel sounds, without guarding, and without rebound.   Neurologic:  Alert and   oriented x4;  grossly normal neurologically.  Impression/Plan: Norma Wall is now here to undergo a screening colonoscopy.  Risks, benefits, and alternatives regarding colonoscopy have been reviewed with the patient.  Questions have been answered.  All parties agreeable.

## 2020-07-21 NOTE — Op Note (Signed)
[facility] Patient Name: Norma Wall Procedure Date: 07/21/2020 11:12 AM MRN: [id number] Account #: [id number] Date of Birth: May 16, 1970 Admit Type: Outpatient Age: 50 Room: [facility] OR ROOM 01 Gender: Female Note Status: Finalized Procedure:             Colonoscopy Indications:           Screening for colorectal malignant neoplasm Providers:             [name] MD, MD Medicines:             Propofol per Anesthesia Complications:         No immediate complications. Procedure:             Pre-Anesthesia Assessment:                        - Prior to the procedure, a History and Physical was                         performed, and patient medications and allergies were                         reviewed. The patient's tolerance of previous                         anesthesia was also reviewed. The risks and benefits                         of the procedure and the sedation options and risks                         were discussed with the patient. All questions were                         answered, and informed consent was obtained. Prior                         Anticoagulants: The patient has taken no previous                         anticoagulant or antiplatelet agents. ASA Grade                         Assessment: II - A patient with mild systemic disease.                         After reviewing the risks and benefits, the patient                         was deemed in satisfactory condition to undergo the                         procedure.                        After obtaining informed consent, the colonoscope was                         passed under direct vision. Throughout the procedure,  the patient's blood pressure, pulse, and oxygen                         saturations were monitored continuously. The                         Colonoscope was introduced through the anus and                         advanced to the the  cecum, identified by appendiceal                         orifice and ileocecal valve. The colonoscopy was                         performed without difficulty. The patient tolerated                         the procedure well. The quality of the bowel                         preparation was fair. Findings:      The perianal and digital rectal examinations were normal.      A 5 mm polyp was found in the cecum. The polyp was sessile. The polyp       was removed with a cold snare. Resection and retrieval were complete.      A 3 mm polyp was found in the descending colon. The polyp was sessile.       The polyp was removed with a cold biopsy forceps. Resection and       retrieval were complete.      Non-bleeding internal hemorrhoids were found during retroflexion. The       hemorrhoids were Grade II (internal hemorrhoids that prolapse but reduce       spontaneously). Impression:            - Preparation of the colon was fair.                        - One 5 mm polyp in the cecum, removed with a cold                         snare. Resected and retrieved.                        - One 3 mm polyp in the descending colon, removed with                         a cold biopsy forceps. Resected and retrieved.                        - Non-bleeding internal hemorrhoids. Recommendation:        - Discharge patient to home.                        - Resume previous diet.                        - Continue present medications.                        -  Await pathology results.                        - Repeat colonoscopy in 5 years for surveillance if                         adenomatous and 10 years if hyperplastic. [name] MD, MD 07/21/2020 11:44:03 AM This report has been signed electronically. Number of Addenda: 0 Note Initiated On: 07/21/2020 11:12 AM Scope Withdrawal Time: 0 hours 9 minutes 24 seconds  Total Procedure Duration: 0 hours 15 minutes 45 seconds  Estimated Blood Loss:  Estimated blood loss:  none.      [facility]

## 2020-07-21 NOTE — Anesthesia Preprocedure Evaluation (Signed)
Anesthesia Evaluation  Patient identified by MRN, date of birth, ID band Patient awake    History of Anesthesia Complications (+) PONV  Airway Mallampati: II  TM Distance: >3 FB Neck ROM: Full    Dental no notable dental hx.    Pulmonary former smoker,    Pulmonary exam normal        Cardiovascular negative cardio ROS Normal cardiovascular exam     Neuro/Psych  Headaches, Anxiety    GI/Hepatic negative GI ROS, Neg liver ROS,   Endo/Other  negative endocrine ROS  Renal/GU negative Renal ROS  negative genitourinary   Musculoskeletal negative musculoskeletal ROS (+)   Abdominal Normal abdominal exam  (+)   Peds  Hematology negative hematology ROS (+)   Anesthesia Other Findings   Reproductive/Obstetrics negative OB ROS                             Anesthesia Physical Anesthesia Plan  ASA: 2  Anesthesia Plan: General   Post-op Pain Management:    Induction: Intravenous  PONV Risk Score and Plan: 4 or greater and TIVA, Treatment may vary due to age or medical condition and Propofol infusion  Airway Management Planned: Natural Airway and Nasal Cannula  Additional Equipment:   Intra-op Plan:   Post-operative Plan:   Informed Consent: I have reviewed the patients History and Physical, chart, labs and discussed the procedure including the risks, benefits and alternatives for the proposed anesthesia with the patient or authorized representative who has indicated his/her understanding and acceptance.     Dental advisory given  Plan Discussed with: CRNA  Anesthesia Plan Comments:         Anesthesia Quick Evaluation

## 2020-07-21 NOTE — Transfer of Care (Signed)
Immediate Anesthesia Transfer of Care Note  Patient: Norma Wall  Procedure(s) Performed: COLONOSCOPY WITH PROPOFOL (Rectum) POLYPECTOMY (Rectum)  Patient Location: PACU  Anesthesia Type: General  Level of Consciousness: awake, alert  and patient cooperative  Airway and Oxygen Therapy: Patient Spontanous Breathing and Patient connected to supplemental oxygen  Post-op Assessment: Post-op Vital signs reviewed, Patient's Cardiovascular Status Stable, Respiratory Function Stable, Patent Airway and No signs of Nausea or vomiting  Post-op Vital Signs: Reviewed and stable  Complications: No notable events documented.

## 2020-07-21 NOTE — Anesthesia Procedure Notes (Signed)
Date/Time: 07/21/2020 11:23 AM Performed by: Dionne Bucy, CRNA Pre-anesthesia Checklist: Patient identified, Emergency Drugs available, Suction available, Patient being monitored and Timeout performed Patient Re-evaluated:Patient Re-evaluated prior to induction Oxygen Delivery Method: Nasal cannula Induction Type: IV induction Placement Confirmation: positive ETCO2

## 2020-07-21 NOTE — Anesthesia Postprocedure Evaluation (Signed)
Anesthesia Post Note  Patient: Norma Wall  Procedure(s) Performed: COLONOSCOPY WITH PROPOFOL (Rectum) POLYPECTOMY (Rectum)     Patient location during evaluation: PACU Anesthesia Type: General Level of consciousness: awake and alert Pain management: pain level controlled Vital Signs Assessment: post-procedure vital signs reviewed and stable Respiratory status: spontaneous breathing and nonlabored ventilation Cardiovascular status: blood pressure returned to baseline Postop Assessment: no apparent nausea or vomiting Anesthetic complications: no   No notable events documented.  Waldron Gerry [employer]

## 2020-07-22 ENCOUNTER — Encounter: Payer: Self-pay | Admitting: Gastroenterology

## 2020-07-23 LAB — SURGICAL PATHOLOGY

## 2020-07-24 ENCOUNTER — Encounter: Payer: Self-pay | Admitting: Gastroenterology

## 2020-07-28 ENCOUNTER — Other Ambulatory Visit: Payer: Self-pay

## 2020-07-28 ENCOUNTER — Encounter: Payer: Self-pay | Admitting: Obstetrics and Gynecology

## 2020-07-28 ENCOUNTER — Ambulatory Visit (INDEPENDENT_AMBULATORY_CARE_PROVIDER_SITE_OTHER): Payer: Federal, State, Local not specified - PPO | Admitting: Obstetrics and Gynecology

## 2020-07-28 DIAGNOSIS — N83201 Unspecified ovarian cyst, right side: Secondary | ICD-10-CM

## 2020-07-28 DIAGNOSIS — N939 Abnormal uterine and vaginal bleeding, unspecified: Secondary | ICD-10-CM | POA: Diagnosis not present

## 2020-07-28 DIAGNOSIS — G8929 Other chronic pain: Secondary | ICD-10-CM

## 2020-07-28 DIAGNOSIS — R102 Pelvic and perineal pain: Secondary | ICD-10-CM | POA: Diagnosis not present

## 2020-07-28 DIAGNOSIS — N809 Endometriosis, unspecified: Secondary | ICD-10-CM | POA: Diagnosis not present

## 2020-07-28 DIAGNOSIS — N9985 Post endometrial ablation syndrome: Secondary | ICD-10-CM

## 2020-07-28 NOTE — Progress Notes (Signed)
Gynecology Ultrasound Follow Up  Chief Complaint:  Chief Complaint  Patient presents with   Follow-up    Phone visit - F/U ultrasound BIBC, no concerns.     History of Present Illness: Patient is a 50 y.o. female who presents today for ultrasound evaluation of acute exacerbation chronic pelvic pain in setting of prior endometrial ablation (failed) as well as known stage II endometriosis.  Ultrasound demonstrates the following findgins Adnexa: 3cm right ovarian cyst hemorrhagic vs endometrium Uterus: Non-enlarged with endometrial stripe  without focal abnormalities but small fluid collection at the fundal endometrium Additional: no free fluid  Review of Systems: ROS  Past Medical History:  Past Medical History:  Diagnosis Date   Abdominal pain, right upper quadrant 2013   Anxiety    Endometriosis 01/2013   Gallbladder polyp    Lipidemia 2020   Lymphedema 01/2019   Migraine headache    3-4x/yr   Ovarian cyst 2014   right ovary   PONV (postoperative nausea and vomiting)    Vertigo    resolves quickly    Past Surgical History:  Past Surgical History:  Procedure Laterality Date   ABLATION  2012   cyst removal   CHOLECYSTECTOMY N/A 10/19/2018   Procedure: LAPAROSCOPIC CHOLECYSTECTOMY;  Surgeon: Jules Husbands, MD;  Location: [facility];  Service: General;  Laterality: N/A;   COLONOSCOPY WITH PROPOFOL N/A 07/21/2020   Procedure: COLONOSCOPY WITH PROPOFOL;  Surgeon: [name], MD;  Location: [place];  Service: Endoscopy;  Laterality: N/A;   LAPAROSCOPIC OVARIAN CYSTECTOMY Right 10/19/2018   Procedure: LAPAROSCOPIC RIGHT OVARIAN CYSTECTOMY;  Surgeon: Malachy Mood, MD;  Location: [facility];  Service: Gynecology;  Laterality: Right;   NECK SURGERY  02/22/2018   Artificial Disc replacement    OVARIAN CYST REMOVAL     POLYPECTOMY N/A 07/21/2020   Procedure: POLYPECTOMY;  Surgeon: [name], MD;  Location: [place];  Service: Endoscopy;   Laterality: N/A;    Gynecologic History:  Patient's last menstrual period was 07/02/2020 (exact date).   Family History:  Family History  Problem Relation Age of Onset   Lung cancer Father    Diabetes Mother    Stroke Mother    Diabetes Maternal Grandmother    Colon cancer Neg Hx    Breast cancer Neg Hx     Social History:  Social History   Socioeconomic History   Marital status: Married    Spouse name: Not on file   Number of children: Not on file   Years of education: Not on file   Highest education level: Not on file  Occupational History   Not on file  Tobacco Use   Smoking status: Former    Pack years: 0.00    Types: Cigarettes    Quit date: 02/01/1997    Years since quitting: 23.5   Smokeless tobacco: Never  Vaping Use   Vaping Use: Never used  Substance and Sexual Activity   Alcohol use: Yes    Comment: occassional    Drug use: No   Sexual activity: Yes    Birth control/protection: None  Other Topics Concern   Not on file  Social History Narrative   Not on file   Social Determinants of Health   Financial Resource Strain: Not on file  Food Insecurity: Not on file  Transportation Needs: Not on file  Physical Activity: Not on file  Stress: Not on file  Social Connections: Not on file  Intimate Partner Violence: Not on  file    Allergies:  No Known Allergies  Medications: Prior to Admission medications   Medication Sig Start Date End Date Taking? Authorizing Provider  clonazePAM (KLONOPIN) 0.5 MG tablet TAKE 1 TABLET BY MOUTH TWICE DAILY AS NEEDED FOR ANXIETY OR PALPITATIONS 04/16/19  Yes Fenton Malling M, PA-C  oxyCODONE-acetaminophen (PERCOCET/ROXICET) 5-325 MG tablet Take 1 tablet by mouth every 4 (four) hours as needed for severe pain. Patient not taking: No sig reported 07/02/20   Malachy Mood, MD  polyethylene glycol-electrolytes (GAVILYTE-N WITH FLAVOR PACK) 420 g solution Drink one 8 oz glass every 20 mins until entire container is  finished starting at 5:00pm on 07/20/20 07/16/20   [name], MD  SUMAtriptan (IMITREX) 50 MG tablet Take 1 tablet (50 mg total) by mouth every 2 (two) hours as needed for migraine. May repeat in 2 hours if headache persists or recurs. 01/01/20   Mar Daring, PA-C    Physical Exam Vitals: Last menstrual period 07/02/2020.  No physical exam as this was a remote telephone visit to promote social distancing during the current COVID-19 Pandemic  TVUS 07/16/20  CLINICAL INDICATION: 50 years old Female with aub, pelvic pain  - N93.9 - Abnormal uterine bleeding (AUB) - R10.2 - Pelvic pain in female     TECHNIQUE: Ultrasound views of the pelvis were obtained endovaginally and transabdominally using gray scale and color Doppler imaging. Spectral Doppler imaging was also performed.   COMPARISON: Ultrasound 09/13/2019   FINDINGS:   UTERUS/CERVIX: The uterus is retroverted. Small hypoechoic focus along the posterior lower uterine wall which may represent a small intramural fibroid measuring 0.9 x 0.7 x 1.3 cm.  The endometrium demonstrates a fluid collection at the level of the fundus measuring 2.2 x 1.8 x 2.0 cm.   OVARIES: The ovaries were seen well transvaginally. A 4.3 x 3.0 x 3.7 cm hypoechoic lesion is visualized within the right ovary with homogenous internal echoes. An additional smaller similar cystic lesion is also visualized in the right ovary. Left ovarian follicle is visualized. Normal flow is seen in the ovaries bilaterally.   OTHER: No abnormal pelvic free fluid.   IMPRESSION:  --Right ovarian complex lesions without abnormal flow as described above most suggestive of underlying endometrioma. If further evaluation for endometriomas is required, may consider contrast-enhanced dedicated pelvic MRI.   --Normal Doppler color-flow within the bilateral ovaries.   --Nonspecific focal fluid collection in the fundal endometrium.      Assessment: 50 y.o. G2P2 ultrasound follow  up   Plan: Problem List Items Addressed This Visit       Other   Endometriosis determined by laparoscopy   Other Visit Diagnoses     Chronic pelvic pain in female    -  Primary   Abnormal uterine bleeding       Post endometrial ablation syndrome       Right ovarian cyst           1) Chronic pelvic pain - improved since initial acute exacerbation.  Ultrasound today with right ovarian cyst 3cm as well as some fluid noted at the fundal endometrium in setting of prior ablation. - post for TLH, BS, cystoscopy possible RSO - discussed expectant management, right cystectomy, oophorectomy as alternatives  2) Telephone 12:20  3) Return if symptoms worsen or fail to improve.    Malachy Mood, MD, [facility], [place]

## 2020-08-01 DIAGNOSIS — H0015 Chalazion left lower eyelid: Secondary | ICD-10-CM | POA: Diagnosis not present

## 2020-08-13 ENCOUNTER — Telehealth: Payer: Self-pay

## 2020-08-13 NOTE — Telephone Encounter (Signed)
Left a message for the patient to return the call.  

## 2020-08-13 NOTE — Telephone Encounter (Signed)
Called patient to schedule TLH/BS and cystoscopy, possible right oophorectomy vs cystectomy w Georgianne Fick  DOS 10/09/20  H&P 9/2 @ 1:50  Post Op 9/16 @ 1:50   Pre-admit phone call appointment to be requested - date and time will be included on H&P paper work. Also all appointments will be updated on pt MyChart. Explained that this appointment has a call window. Based on the time scheduled will indicate if the call will be received within a 4 hour window before 1:00 or after.  Advised that pt may also receive calls from the hospital pharmacy and pre-service center.  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 No secondary insurance.   Patient did ask about driving restrictions and about how long should her husband plan to assist her at home.

## 2020-08-13 NOTE — Telephone Encounter (Signed)
-----   Message from Malachy Mood, MD sent at 07/31/2020  9:21 PM EDT ----- Regarding: Surgery Surgery Booking Request Patient Full Name:  Norma Wall  MRN: [id number]  DOB: Dec 14, 1970  Surgeon: Malachy Mood, MD  Requested Surgery Date and Time: patient preference Primary Diagnosis AND Code: Chronic pelvic pain female R10.2 G89.29 Secondary Diagnosis and Code: AUB N93.9, Endometriosis N80.9, Post ablation syndrome N99.85, Right ovarian cyst N83.201 Surgical Procedure: TLH/BS and cystoscopy, possible right oophorectomy vs cystectomy RNFA Requested?: No L&D Notification: No Admission Status: observation Length of Surgery: 125 min Special Case Needs: No H&P: Yes Phone Interview???:  No Interpreter: No Medical Clearance:  No Special Scheduling Instructions: No Any known health/anesthesia issues, diabetes, sleep apnea, latex allergy, defibrillator/pacemaker?: No Acuity: P3   (P1 highest, P2 delay may cause harm, P3 low, elective gyn, P4 lowest)

## 2020-09-08 ENCOUNTER — Emergency Department
Admission: EM | Admit: 2020-09-08 | Discharge: 2020-09-08 | Disposition: A | Discharge status: Home / Self Care | Payer: Federal, State, Local not specified - PPO | Attending: Emergency Medicine | Admitting: Emergency Medicine

## 2020-09-08 ENCOUNTER — Emergency Department: Payer: Federal, State, Local not specified - PPO

## 2020-09-08 ENCOUNTER — Other Ambulatory Visit: Payer: Self-pay

## 2020-09-08 ENCOUNTER — Encounter: Payer: Self-pay | Admitting: Emergency Medicine

## 2020-09-08 DIAGNOSIS — R1031 Right lower quadrant pain: Secondary | ICD-10-CM | POA: Diagnosis not present

## 2020-09-08 DIAGNOSIS — R102 Pelvic and perineal pain: Secondary | ICD-10-CM | POA: Diagnosis not present

## 2020-09-08 DIAGNOSIS — N83201 Unspecified ovarian cyst, right side: Secondary | ICD-10-CM | POA: Diagnosis not present

## 2020-09-08 DIAGNOSIS — N836 Hematosalpinx: Secondary | ICD-10-CM

## 2020-09-08 DIAGNOSIS — Z87891 Personal history of nicotine dependence: Secondary | ICD-10-CM | POA: Diagnosis not present

## 2020-09-08 DIAGNOSIS — N888 Other specified noninflammatory disorders of cervix uteri: Secondary | ICD-10-CM | POA: Diagnosis not present

## 2020-09-08 LAB — URINALYSIS, COMPLETE (UACMP) WITH MICROSCOPIC
Bilirubin Urine: NEGATIVE
Glucose, UA: NEGATIVE mg/dL
Ketones, ur: 80 mg/dL — AB
Leukocytes,Ua: NEGATIVE
Nitrite: NEGATIVE
Protein, ur: NEGATIVE mg/dL
Specific Gravity, Urine: 1.025 (ref 1.005–1.030)
pH: 6 (ref 5.0–8.0)

## 2020-09-08 LAB — PREGNANCY, URINE: Preg Test, Ur: NEGATIVE

## 2020-09-08 LAB — COMPREHENSIVE METABOLIC PANEL
ALT: 31 U/L (ref 0–44)
AST: 28 U/L (ref 15–41)
Albumin: 4.2 g/dL (ref 3.5–5.0)
Alkaline Phosphatase: 43 U/L (ref 38–126)
Anion gap: 6 (ref 5–15)
BUN: 13 mg/dL (ref 6–20)
CO2: 22 mmol/L (ref 22–32)
Calcium: 9.2 mg/dL (ref 8.9–10.3)
Chloride: 106 mmol/L (ref 98–111)
Creatinine, Ser: 0.69 mg/dL (ref 0.44–1.00)
GFR, Estimated: >60 mL/min (ref >60)
Glucose, Bld: 99 mg/dL (ref 70–99)
Potassium: 4.1 mmol/L (ref 3.5–5.1)
Sodium: 134 mmol/L — ABNORMAL LOW (ref 135–145)
Total Bilirubin: 0.9 mg/dL (ref 0.3–1.2)
Total Protein: 7.4 g/dL (ref 6.5–8.1)

## 2020-09-08 LAB — CBC
HCT: 41.9 % (ref 36.0–46.0)
Hemoglobin: 13.9 g/dL (ref 12.0–15.0)
MCH: 30.3 pg (ref 26.0–34.0)
MCHC: 33.2 g/dL (ref 30.0–36.0)
MCV: 91.3 fL (ref 80.0–100.0)
Platelets: 180 10*3/uL (ref 150–400)
RBC: 4.59 MIL/uL (ref 3.87–5.11)
RDW: 12.8 % (ref 11.5–15.5)
WBC: 7.5 10*3/uL (ref 4.0–10.5)
nRBC: 0 % (ref 0.0–0.2)

## 2020-09-08 LAB — LIPASE, BLOOD: Lipase: 31 U/L (ref 11–51)

## 2020-09-08 MED ORDER — OXYCODONE-ACETAMINOPHEN 5-325 MG PO TABS: 1.0000 | ORAL_TABLET | Freq: Four times a day (QID) | ORAL | 0 refills | Status: DC | PRN

## 2020-09-08 MED ORDER — ONDANSETRON HCL 4 MG/2ML IJ SOLN
4.0000 mg | Freq: Once | INTRAMUSCULAR | Status: AC
  Administered 2020-09-08: 4 mg via INTRAVENOUS
  Filled 2020-09-08: qty 2

## 2020-09-08 MED ORDER — IOHEXOL 350 MG/ML SOLN
80.0000 mL | Freq: Once | INTRAVENOUS | Status: AC | PRN
  Administered 2020-09-08: 80 mL via INTRAVENOUS
  Filled 2020-09-08: qty 80

## 2020-09-08 MED ORDER — MORPHINE SULFATE (PF) 4 MG/ML IV SOLN
4.0000 mg | Freq: Once | INTRAVENOUS | Status: AC
  Administered 2020-09-08: 4 mg via INTRAVENOUS
  Filled 2020-09-08: qty 1

## 2020-09-08 MED ORDER — OXYCODONE-ACETAMINOPHEN 5-325 MG PO TABS
1.0000 | ORAL_TABLET | Freq: Once | ORAL | Status: AC
Start: 2020-09-08 — End: 2020-09-08
  Administered 2020-09-08: 1 via ORAL
  Filled 2020-09-08: qty 1

## 2020-09-08 MED ORDER — MORPHINE SULFATE (PF) 4 MG/ML IV SOLN
4.0000 mg | Freq: Once | INTRAVENOUS | Status: AC
Start: 2020-09-08 — End: 2020-09-08
  Administered 2020-09-08: 4 mg via INTRAVENOUS
  Filled 2020-09-08: qty 1

## 2020-09-08 MED ORDER — SODIUM CHLORIDE 0.9 % IV BOLUS
1000.0000 mL | Freq: Once | INTRAVENOUS | Status: AC
  Administered 2020-09-08: 1000 mL via INTRAVENOUS

## 2020-09-08 NOTE — ED Provider Notes (Signed)
[facility] Emergency Department Provider Note  ____________________________________________  Time seen: Approximately 4:28 PM  I have reviewed the triage vital signs and the nursing notes.   HISTORY  Chief Complaint Abdominal Pain    HPI Norma Wall is a 50 y.o. female who presents emergency department complaining of right flank and right lower abdominal pain for 2 days.  Patient states that she developed some right flank pain yesterday.  She took some medications, related and her hot tub and symptoms improved.  She states that she does have dysmenorrhea, scheduled to have a hysterectomy and oophorectomy in a month due to this ongoing severe dysmenorrhea.  She also has endometriosis and right ovarian cyst.  She states that the pain is in a different location in a different severity from normal.  She states that while this still may be attributed to her underlying conditions due to the significant change in location and level of pain she was concern for other etiology.  No history of nephrolithiasis.  She has had a cholecystectomy but still has her appendix.  She denied any dysuria, polyuria, hematuria.  No diarrhea or constipation.  Positive for nausea with the pain but no emesis.  Patient had tried over-the-counter medications earlier today with no relief of symptoms.       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   Lipedema 01/10/2019   Endometriosis determined by laparoscopy 07/06/2018    Atypical chest pain 08/26/2017   Pruritus ani 08/24/2016   History of rectal bleeding 08/24/2016   Obesity (BMI 30-39.9) 11/20/2014   Dermatitis, eczematoid 11/20/2014   External hemorrhoid 11/20/2014   High potassium 11/20/2014   Bursitis 11/20/2014   Migraine 08/28/2014   Gallbladder polyp 09/13/2012   Avitaminosis D 05/19/2009    Past Surgical History:  Procedure Laterality Date   ABLATION  2012   cyst removal   CHOLECYSTECTOMY N/A 10/19/2018   Procedure: LAPAROSCOPIC CHOLECYSTECTOMY;  Surgeon: Jules Husbands, MD;  Location: [facility];  Service: General;  Laterality: N/A;   COLONOSCOPY WITH PROPOFOL N/A 07/21/2020   Procedure: COLONOSCOPY WITH PROPOFOL;  Surgeon: [name], MD;  Location: [place];  Service: Endoscopy;  Laterality: N/A;   LAPAROSCOPIC OVARIAN CYSTECTOMY Right 10/19/2018   Procedure: LAPAROSCOPIC RIGHT OVARIAN CYSTECTOMY;  Surgeon: Malachy Mood, MD;  Location: [facility];  Service: Gynecology;  Laterality: Right;   NECK SURGERY  02/22/2018   Artificial Disc replacement    OVARIAN CYST REMOVAL     POLYPECTOMY N/A 07/21/2020   Procedure: POLYPECTOMY;  Surgeon: [name], MD;  Location: [place];  Service: Endoscopy;  Laterality: N/A;    Prior to Admission medications   Medication Sig Start Date End Date Taking? Authorizing Provider  clonazePAM (KLONOPIN) 0.5 MG tablet TAKE 1 TABLET BY MOUTH TWICE DAILY AS NEEDED FOR ANXIETY OR PALPITATIONS 04/16/19   Mar Daring, PA-C  oxyCODONE-acetaminophen (PERCOCET/ROXICET) 5-325 MG tablet Take  1 tablet by mouth every 4 (four) hours as needed for severe pain. Patient not taking: No sig reported 07/02/20   Malachy Mood, MD  polyethylene glycol-electrolytes (GAVILYTE-N WITH FLAVOR PACK) 420 g solution Drink one 8 oz glass every 20 mins until entire container is finished starting at 5:00pm on 07/20/20 07/16/20   [name], MD  SUMAtriptan (IMITREX) 50 MG tablet Take 1 tablet (50 mg total)  by mouth every 2 (two) hours as needed for migraine. May repeat in 2 hours if headache persists or recurs. 01/01/20   Mar Daring, PA-C    Allergies Patient has no known allergies.  Family History  Problem Relation Age of Onset   Lung cancer Father    Diabetes Mother    Stroke Mother    Diabetes Maternal Grandmother    Colon cancer Neg Hx    Breast cancer Neg Hx     Social History Social History   Tobacco Use   Smoking status: Former    Types: Cigarettes    Quit date: 02/01/1997    Years since quitting: 23.6   Smokeless tobacco: Never  Vaping Use   Vaping Use: Never used  Substance Use Topics   Alcohol use: Yes    Comment: occassional    Drug use: No     Review of Systems  Constitutional: No fever/chills Eyes: No visual changes. No discharge ENT: No upper respiratory complaints. Cardiovascular: no chest pain. Respiratory: no cough. No SOB. Gastrointestinal: Right flank radiating into right lower abdominal pain.  Positive for nausea with pain but no vomiting.  No diarrhea.  No constipation. Genitourinary: Negative for dysuria. No hematuria.  Currently menstruating. Musculoskeletal: Negative for musculoskeletal pain. Skin: Negative for rash, abrasions, lacerations, ecchymosis. Neurological: Negative for headaches, focal weakness or numbness.  10 System ROS otherwise negative.  ____________________________________________   PHYSICAL EXAM:  VITAL SIGNS: ED Triage Vitals  Enc Vitals Group     BP 09/08/20 1429 121/82     Pulse Rate 09/08/20 1429 72     Resp 09/08/20 1429 20     Temp 09/08/20 1429 98.4 F (36.9 C)     Temp Source 09/08/20 1429 Oral     SpO2 09/08/20 1429 99 %     Weight 09/08/20 1434 185 lb (83.9 kg)     Height 09/08/20 1434 '5\' 7"'$  (1.702 m)     Head Circumference --      Peak Flow --      Pain Score 09/08/20 1434 9     Pain Loc --      Pain Edu? --      Excl. in [place]? --      Constitutional: Alert and oriented. Well appearing  and in no acute distress. Eyes: Conjunctivae are normal. PERRL. EOMI. Head: Atraumatic. ENT:      Ears:       Nose: No congestion/rhinnorhea.      Mouth/Throat: Mucous membranes are moist.  Neck: No stridor.   Hematological/Lymphatic/Immunilogical: No cervical lymphadenopathy. Cardiovascular: Normal rate, regular rhythm. Normal S1 and S2.  Good peripheral circulation. Respiratory: Normal respiratory effort without tachypnea or retractions. Lungs CTAB. Good air entry to the bases with no decreased or absent breath sounds. Gastrointestinal: No external abdominal wall findings.  Bowel sounds 4 quadrants.  Soft to palpation all quadrants.  Patient is tender in the right lower quadrant extending into the right lateral abdominal wall.  Mild guarding in this region.  No other significant tenderness in the other quadrants or suprapubic region.  No other guarding and no rigidity. No palpable masses. No distention.  Mild right-sided CVA tenderness Musculoskeletal: Full range of motion to all extremities. No gross deformities appreciated. Neurologic:  Normal speech and language. No gross focal neurologic deficits are appreciated.  Skin:  Skin is warm, dry and intact. No rash noted. Psychiatric: Mood and affect are normal. Speech and behavior are normal. Patient exhibits appropriate insight and judgement.   ____________________________________________   LABS (all labs ordered are listed, but only abnormal results are displayed)  Labs Reviewed  COMPREHENSIVE METABOLIC PANEL - Abnormal; Notable for the following components:      Result Value   Sodium 134 (*)    All other components within normal limits  URINALYSIS, COMPLETE (UACMP) WITH MICROSCOPIC - Abnormal; Notable for the following components:   Color, Urine YELLOW (*)    APPearance CLEAR (*)    Hgb urine dipstick LARGE (*)    Ketones, ur 80 (*)    Bacteria, UA RARE (*)    All other components within normal limits  LIPASE, BLOOD  CBC   PREGNANCY, URINE   ____________________________________________  EKG   ____________________________________________  RADIOLOGY I personally viewed and evaluated these images as part of my medical decision making, as well as reviewing the written report by the radiologist.  ED Provider Interpretation: Findings on CT scan revealed what appeared to be a complex cystic structure in the right adnexa.  It was recommended that ultrasound be ordered for further evaluation.  I discussed the results with the radiologist about the ultrasound.  Noted.  That this was likely hydrosalpinx versus pyosalpinx versus hematosalpinx.  Given the reassuring white blood cell count with really no concerns for STD or infection on physical exam, it was felt that this was more likely hydro versus hematosalpinx.  I discussed these results with on-call OB/GYN who feels that based off of the patient's history this is likely hematosalpinx.  CT ABDOMEN PELVIS W CONTRAST  Result Date: 09/08/2020 CLINICAL DATA:  Right lower quadrant pain EXAM: CT ABDOMEN AND PELVIS WITH CONTRAST TECHNIQUE: Multidetector CT imaging of the abdomen and pelvis was performed using the standard protocol following bolus administration of intravenous contrast. CONTRAST:  63m OMNIPAQUE IOHEXOL 350 MG/ML SOLN COMPARISON:  CT 11/22/2011 FINDINGS: Lower chest: No acute abnormality. Hepatobiliary: No focal liver abnormality is seen. Status post cholecystectomy. No biliary dilatation. Pancreas: Unremarkable. No pancreatic ductal dilatation or surrounding inflammatory changes. Spleen: Normal in size without focal abnormality. Adrenals/Urinary Tract: Adrenal glands are unremarkable. Kidneys are normal, without renal calculi, focal lesion, or hydronephrosis. Bladder is unremarkable. Stomach/Bowel: Stomach is within normal limits. Appendix appears normal. No evidence of bowel wall thickening, distention, or inflammatory changes. Diverticular disease of the colon  without acute inflammatory change. Vascular/Lymphatic: No significant vascular findings are present. No enlarged abdominal or pelvic lymph nodes. Reproductive: Endometrial thickening at the fundus up to 20 mm. Multi loculated cystic mass in the right adnexa measuring 7.2 by 5.1 by 6 cm. Cervical enlargement. Other: Trace free fluid. No free air. Small fat containing umbilical and supraumbilical ventral hernia Musculoskeletal: No acute or significant osseous findings. IMPRESSION: 1. Negative for acute appendicitis. 2. 7.2 cm multi loculated cystic mass in the right adnexa, incompletely evaluated. Recommend pelvic ultrasound for further evaluation. 3. Endometrial thickening up to 20 mm, this may also be evaluated at pelvic ultrasound 4. Trace fluid in the pelvis 5. Diverticular disease of the colon without acute inflammation Electronically Signed   By: KDonavan FoilM.D.   On: 09/08/2020 17:15  US PELVIC COMPLETE W TRANSVAGINAL AND TORSION R/O  Result Date: 09/08/2020 CLINICAL DATA:  Pelvic pain for 1 day. EXAM: TRANSABDOMINAL ULTRASOUND OF PELVIS DOPPLER ULTRASOUND OF OVARIES TECHNIQUE: Transabdominal ultrasound examination of the pelvis was performed including evaluation of the uterus, ovaries, adnexal regions, and pelvic cul-de-sac. Color and duplex Doppler ultrasound was utilized to evaluate blood flow to the ovaries. COMPARISON:  Pelvic ultrasound 08/31/2018 FINDINGS: Uterus Measurements: 12.3 x 5.1 x 5.6 cm = volume: 183 mL. Uterus is retroverted. Minimal fluid is present in the endometrial canal. No parenchymal lesions are present. Nabothian cysts noted. Endometrium Thickness: 15.5 mm.  No focal abnormality visualized. Right ovary Measurements: 7.4 x 5.5 x 6.2 cm = volume: 132 mL. Multiloculated cystic lesion in the right adnexa present. This appears to be mostly tubular nature. Normal color Doppler flow and waveforms in the parenchymal components. Tubular components are separate from the area with flow.  Left ovary Measurements: 2.9 x 1.8 x 2.3 cm = volume: 6 mL. Normal appearance/no adnexal mass. Pulsed Doppler evaluation demonstrates normal low-resistance arterial and venous waveforms in both ovaries. Other: No free fluid. IMPRESSION: 1. Tubular structure adjacent the right adnexa with debris. This is concerning for hydrosalpinx or hematosalpinx. Infection is not excluded although normal white count noted. Please correlate with exam. Consider gyn consult. Follow-up nonemergent MRI could be used for further evaluation if clinically indicated. 2. This normal color Doppler analysis of the right adnexa with normal arterial and venous waveforms. No evidence for torsion. 3. Normal sonographic appearance of the uterus and left adnexa. These results were called by telephone at the time of interpretation on 09/08/2020 at 7:11 pm to provider [facility] , who verbally acknowledged these results. Electronically Signed   By: San Morelle M.D.   On: 09/08/2020 19:17    ____________________________________________    PROCEDURES  Procedure(s) performed:    Procedures    Medications  morphine 4 MG/ML injection 4 mg (has no administration in time range)  ondansetron (ZOFRAN) injection 4 mg (has no administration in time range)  oxyCODONE-acetaminophen (PERCOCET/ROXICET) 5-325 MG per tablet 1 tablet (1 tablet Oral Given 09/08/20 1443)  sodium chloride 0.9 % bolus 1,000 mL (0 mLs Intravenous Stopped 09/08/20 1720)  ondansetron (ZOFRAN) injection 4 mg (4 mg Intravenous Given 09/08/20 1624)  morphine 4 MG/ML injection 4 mg (4 mg Intravenous Given 09/08/20 1623)  iohexol (OMNIPAQUE) 350 MG/ML injection 80 mL (80 mLs Intravenous Contrast Given 09/08/20 1640)     ____________________________________________   INITIAL IMPRESSION / ASSESSMENT AND PLAN / ED COURSE  Pertinent labs & imaging results that were available during my care of the patient were reviewed by me and considered in my medical decision  making (see chart for details).  Review of the [place] CSRS was performed in accordance of the [place] prior to dispensing any controlled drugs.           Patient's diagnosis is consistent with hematosalpinx.  Patient presented to the emergency department with right flank and right lower abdominal pain.  She has a history of dysmenorrhea and is currently scheduled with her OB/GYN to have a total hysterectomy in 1 months time.  Patient presented with intense pain as well as pain out of the normal pattern for her dysmenorrhea though she is currently menstruating.  Given this finding differential included appendicitis, enteritis, colitis, nephrolithiasis, pyelonephritis, UTI, hemorrhagic cyst, ovarian torsion, pyosalpinx.  Initial CT scan revealed a complex structure in the right adnexa but was indeterminate on the exact origin.  It  was recommended that patient have ultrasound.  This was performed with findings consistent with hydrosalpinx versus pyosalpinx versus hematosalpinx.  I discussed the patient with the patient's OB/GYN, Dr. Georgianne Fick who given the patient's presentation, labs, imaging and presentation feels that this is likely hematosalpinx.  This has occurred in the past for the patient.  She is scheduled for hysterectomy in 1 months time.  OB/GYN recommends pain control for the patient, return precautions and following up with the OB/GYN.  He does state that the scheduled time for the patient's hysterectomy was around the patient's schedule and if she would like to talk about moving up this surgery she may contact the office.  I discussed the findings with the patient who will follow up with her OB/GYN.  I will prescribe pain medication for the patient.  Return precautions discussed with the patient at this time. Patient is given ED precautions to return to the ED for any worsening or new symptoms.     ____________________________________________  FINAL CLINICAL IMPRESSION(S) / ED DIAGNOSES  Final  diagnoses:  Pelvic pain      NEW MEDICATIONS STARTED DURING THIS VISIT:  ED Discharge Orders     None           This chart was dictated using voice recognition software/Dragon. Despite best efforts to proofread, errors can occur which can change the meaning. Any change was purely unintentional.    Brynda Peon 09/08/20 2033    Nance Pear, MD 09/08/20 2052

## 2020-09-08 NOTE — ED Notes (Signed)
See triage note - pt c/o right lower back/flank pain and rlq abdominal pain that started yesterday, uncontrolled at home today. Reports hx of ovarian cysts.

## 2020-09-08 NOTE — ED Triage Notes (Signed)
Pt reports that she developed RLQ pain that began yesterday that radiates to her back. She reports that she applied heat and took ibuprofen it helped. Today she took Ibuprofen but because the pain came back. Denies N/V. Is on her period so unsure if there is blood in her urine.

## 2020-09-09 NOTE — Telephone Encounter (Signed)
Pt called wanting to see if her DOS could be moved up. She was in ER yesterday with pain and wanted to see if changing it would be a possibility.  updated  DOS 8/25  H&P 8/18 @ 4:30   Covid testing 8/23 @ 8am - 12pm, [place], [address]. Advised pt to mask until DOS.  Pre-admit phone call appointment 8/19 @ 8am - 1 pm  Advised that pt may also receive calls from the hospital pharmacy and pre-service center.  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 No secondary insurance.

## 2020-09-15 ENCOUNTER — Other Ambulatory Visit: Payer: Federal, State, Local not specified - PPO

## 2020-09-18 ENCOUNTER — Encounter: Payer: Self-pay | Admitting: Obstetrics and Gynecology

## 2020-09-18 ENCOUNTER — Ambulatory Visit (INDEPENDENT_AMBULATORY_CARE_PROVIDER_SITE_OTHER): Payer: Federal, State, Local not specified - PPO | Admitting: Obstetrics and Gynecology

## 2020-09-18 ENCOUNTER — Other Ambulatory Visit: Payer: Self-pay

## 2020-09-18 VITALS — BP 112/60 | Ht 67.0 in | Wt 190.0 lb

## 2020-09-18 DIAGNOSIS — Z01818 Encounter for other preprocedural examination: Secondary | ICD-10-CM

## 2020-09-19 ENCOUNTER — Other Ambulatory Visit
Admission: RE | Admit: 2020-09-19 | Discharge: 2020-09-19 | Disposition: A | Discharge status: Home / Self Care | Payer: Federal, State, Local not specified - PPO | Source: Ambulatory Visit | Attending: Obstetrics and Gynecology | Admitting: Obstetrics and Gynecology

## 2020-09-19 HISTORY — DX: Malignant (primary) neoplasm, unspecified: C80.1

## 2020-09-19 NOTE — Patient Instructions (Signed)
Your procedure is scheduled on: 09/25/20 - Thursday Report to the Registration Desk on the 1st floor of the [facility]. To find out your arrival time, please call [phone] between 1PM - 3PM on: 09/24/20 - Wednesday  REMEMBER: Instructions that are not followed completely may result in serious medical risk, up to and including death; or upon the discretion of your surgeon and anesthesiologist your surgery may need to be rescheduled.  Do not eat food after midnight the night before surgery.  No gum chewing, lozengers or hard candies.  You may however, drink CLEAR liquids up to 2 hours before you are scheduled to arrive for your surgery. Do not drink anything within 2 hours of your scheduled arrival time.  Clear liquids include: - water  - apple juice without pulp - gatorade (not RED, PURPLE, OR BLUE) - black coffee or tea (Do NOT add milk or creamers to the coffee or tea) Do NOT drink anything that is not on this list.  TAKE THESE MEDICATIONS THE MORNING OF SURGERY WITH A SIP OF WATER: NONE  One week prior to surgery: Stop Anti-inflammatories (NSAIDS) such as Advil, Aleve, Ibuprofen, Motrin, Naproxen, Naprosyn and Aspirin based products such as Excedrin, Goodys Powder, BC Powder.  Stop ANY OVER THE COUNTER supplements until after surgery.  You may however, continue to take Tylenol if needed for pain up until the day of surgery.  No Alcohol for 24 hours before or after surgery.  No Smoking including e-cigarettes for 24 hours prior to surgery.  No chewable tobacco products for at least 6 hours prior to surgery.  No nicotine patches on the day of surgery.  Do not use any "recreational" drugs for at least a week prior to your surgery.  Please be advised that the combination of cocaine and anesthesia may have negative outcomes, up to and including death. If you test positive for cocaine, your surgery will be cancelled.  On the morning of surgery brush your teeth with toothpaste  and water, you may rinse your mouth with mouthwash if you wish. Do not swallow any toothpaste or mouthwash.  Do not wear jewelry, make-up, hairpins, clips or nail polish.  Do not wear lotions, powders, or perfumes.   Do not shave body from the neck down 48 hours prior to surgery just in case you cut yourself which could leave a site for infection.  Also, freshly shaved skin may become irritated if using the CHG soap.  Contact lenses, hearing aids and dentures may not be worn into surgery.  Do not bring valuables to the hospital. [facility] is not responsible for any missing/lost belongings or valuables.   Use CHG Soap or wipes as directed on instruction sheet.  Notify your doctor if there is any change in your medical condition (cold, fever, infection).  Wear comfortable clothing (specific to your surgery type) to the hospital.  After surgery, you can help prevent lung complications by doing breathing exercises.  Take deep breaths and cough every 1-2 hours. Your doctor may order a device called an Incentive Spirometer to help you take deep breaths. When coughing or sneezing, hold a pillow firmly against your incision with both hands. This is called "splinting." Doing this helps protect your incision. It also decreases belly discomfort.  If you are being admitted to the hospital overnight, leave your suitcase in the car. After surgery it may be brought to your room.  If you are being discharged the day of surgery, you will not be allowed  to drive home. You will need a responsible adult (18 years or older) to drive you home and stay with you that night.   If you are taking public transportation, you will need to have a responsible adult (18 years or older) with you. Please confirm with your physician that it is acceptable to use public transportation.   Please call the [facility]. at [phone] if you have any questions about these instructions.  Surgery  Visitation Policy:  Patients undergoing a surgery or procedure may have one family member or support person with them as long as that person is not COVID-19 positive or experiencing its symptoms.  That person may remain in the waiting area during the procedure.  Inpatient Visitation:    Visiting hours are 7 a.m. to 8 p.m. Inpatients will be allowed two visitors daily. The visitors may change each day during the patient's stay. No visitors under the age of 35. Any visitor under the age of 27 must be accompanied by an adult. The visitor must pass COVID-19 screenings, use hand sanitizer when entering and exiting the patient's room and wear a mask at all times, including in the patient's room. Patients must also wear a mask when staff or their visitor are in the room. Masking is required regardless of vaccination status.

## 2020-09-19 NOTE — Progress Notes (Signed)
Obstetrics & Gynecology Surgery H&P    Chief Complaint: Scheduled Surgery   History of Present Illness: Patient is a 50 y.o. G2P2 presenting for scheduled TLH, BS, cystoscopy, for the treatment or further evaluation of postablation tubal syndrome.   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   Lipedema 01/10/2019   Endometriosis determined by laparoscopy 07/06/2018    Stage II 01/26/2013 with both ovarian fossas involved, posterior cul de sac peritoneal window    Atypical chest pain 08/26/2017   Pruritus ani 08/24/2016   History of rectal bleeding 08/24/2016   Obesity (BMI 30-39.9) 11/20/2014   Dermatitis, eczematoid 11/20/2014   External hemorrhoid 11/20/2014   High potassium 11/20/2014   Bursitis 11/20/2014   Migraine 08/28/2014   Gallbladder polyp 09/13/2012   Avitaminosis D 05/19/2009    Past Surgical History:  Past Surgical History:  Procedure Laterality Date   ABLATION  2012   cyst removal   CHOLECYSTECTOMY N/A 10/19/2018   Procedure: LAPAROSCOPIC CHOLECYSTECTOMY;  Surgeon: Jules Husbands, MD;  Location: [facility];  Service: General;  Laterality: N/A;   COLONOSCOPY WITH PROPOFOL N/A 07/21/2020   Procedure: COLONOSCOPY WITH PROPOFOL;  Surgeon: [name], MD;  Location: [place];  Service: Endoscopy;  Laterality: N/A;   LAPAROSCOPIC OVARIAN CYSTECTOMY Right 10/19/2018   Procedure: LAPAROSCOPIC RIGHT OVARIAN CYSTECTOMY;   Surgeon: Malachy Mood, MD;  Location: [facility];  Service: Gynecology;  Laterality: Right;   NECK SURGERY  02/22/2018   Artificial Disc replacement    OVARIAN CYST REMOVAL     POLYPECTOMY N/A 07/21/2020   Procedure: POLYPECTOMY;  Surgeon: [name], MD;  Location: [place];  Service: Endoscopy;  Laterality: N/A;    Family History:  Family History  Problem Relation Age of Onset   Lung cancer Father    Diabetes Mother    Stroke Mother    Diabetes Maternal Grandmother    Colon cancer Neg Hx    Breast cancer Neg Hx     Social History:  Social History   Socioeconomic History   Marital status: Married    Spouse name: Not on file   Number of children: Not on file   Years of education: Not on file   Highest education level: Not on file  Occupational History   Not on file  Tobacco Use   Smoking status: Former    Types: Cigarettes    Quit date: 02/01/1997    Years since quitting: 23.6   Smokeless tobacco: Never  Vaping Use   Vaping Use: Never used  Substance and Sexual Activity   Alcohol use: Yes    Comment: occassional    Drug use: No   Sexual activity: Yes    Birth control/protection: None  Other Topics Concern   Not on file  Social History Narrative   Not on file   Social Determinants of Health   Financial Resource Strain: Not on file  Food Insecurity: Not on file  Transportation Needs: Not on file  Physical  Activity: Not on file  Stress: Not on file  Social Connections: Not on file  Intimate Partner Violence: Not on file    Allergies:  No Known Allergies  Medications: Prior to Admission medications   Medication Sig Start Date End Date Taking? Authorizing Provider  acetaminophen (TYLENOL) 500 MG tablet Take 1,000 mg by mouth every 6 (six) hours as needed for moderate pain, fever or headache.    [provider]  clonazePAM (KLONOPIN) 0.5 MG tablet TAKE 1 TABLET BY MOUTH TWICE DAILY AS NEEDED FOR ANXIETY OR PALPITATIONS 04/16/19    Mar Daring, PA-C  ibuprofen (ADVIL) 200 MG tablet Take 400-800 mg by mouth every 6 (six) hours as needed for mild pain or moderate pain.    [provider]  oxyCODONE-acetaminophen (PERCOCET/ROXICET) 5-325 MG tablet Take 1 tablet by mouth every 6 (six) hours as needed for severe pain. Patient not taking: Reported on 09/15/2020 09/08/20   Cuthriell, Charline Bills, PA-C  phenylephrine (SUDAFED PE) 10 MG TABS tablet Take 10 mg by mouth every 4 (four) hours as needed (nasal congestion).    [provider]  polyethylene glycol-electrolytes (GAVILYTE-N WITH FLAVOR PACK) 420 g solution Drink one 8 oz glass every 20 mins until entire container is finished starting at 5:00pm on 07/20/20 Patient not taking: Reported on 09/15/2020 07/16/20   [name], MD  SUMAtriptan (IMITREX) 50 MG tablet Take 1 tablet (50 mg total) by mouth every 2 (two) hours as needed for migraine. May repeat in 2 hours if headache persists or recurs. 01/01/20   Mar Daring, PA-C    Physical Exam Vitals: Blood pressure 112/60, height '5\' 7"'$  (1.702 m), weight 190 lb (86.2 kg), last menstrual period 09/08/2020. General: NAD HEENT: normocephalic, anicteric Pulmonary: No increased work of breathing Cardiovascular: RRR, distal pulses 2+ Extremities: no edema, erythema, or tenderness Neurologic: Grossly intact Psychiatric: mood appropriate, affect full  Imaging CT ABDOMEN PELVIS W CONTRAST  Result Date: 09/08/2020 CLINICAL DATA:  Right lower quadrant pain EXAM: CT ABDOMEN AND PELVIS WITH CONTRAST TECHNIQUE: Multidetector CT imaging of the abdomen and pelvis was performed using the standard protocol following bolus administration of intravenous contrast. CONTRAST:  6m OMNIPAQUE IOHEXOL 350 MG/ML SOLN COMPARISON:  CT 11/22/2011 FINDINGS: Lower chest: No acute abnormality. Hepatobiliary: No focal liver abnormality is seen. Status post cholecystectomy. No biliary dilatation. Pancreas: Unremarkable. No pancreatic  ductal dilatation or surrounding inflammatory changes. Spleen: Normal in size without focal abnormality. Adrenals/Urinary Tract: Adrenal glands are unremarkable. Kidneys are normal, without renal calculi, focal lesion, or hydronephrosis. Bladder is unremarkable. Stomach/Bowel: Stomach is within normal limits. Appendix appears normal. No evidence of bowel wall thickening, distention, or inflammatory changes. Diverticular disease of the colon without acute inflammatory change. Vascular/Lymphatic: No significant vascular findings are present. No enlarged abdominal or pelvic lymph nodes. Reproductive: Endometrial thickening at the fundus up to 20 mm. Multi loculated cystic mass in the right adnexa measuring 7.2 by 5.1 by 6 cm. Cervical enlargement. Other: Trace free fluid. No free air. Small fat containing umbilical and supraumbilical ventral hernia Musculoskeletal: No acute or significant osseous findings. IMPRESSION: 1. Negative for acute appendicitis. 2. 7.2 cm multi loculated cystic mass in the right adnexa, incompletely evaluated. Recommend pelvic ultrasound for further evaluation. 3. Endometrial thickening up to 20 mm, this may also be evaluated at pelvic ultrasound 4. Trace fluid in the pelvis 5. Diverticular disease of the colon without acute inflammation Electronically Signed   By: KDonavan FoilM.D.   On: 09/08/2020 17:15  US PELVIC COMPLETE W TRANSVAGINAL AND TORSION R/O  Result Date: 09/08/2020 CLINICAL DATA:  Pelvic pain for 1 day. EXAM: TRANSABDOMINAL ULTRASOUND OF PELVIS DOPPLER ULTRASOUND OF OVARIES TECHNIQUE: Transabdominal ultrasound examination of the pelvis was performed including evaluation of the uterus, ovaries, adnexal regions, and pelvic cul-de-sac. Color and duplex Doppler ultrasound was utilized to evaluate blood flow to the ovaries. COMPARISON:  Pelvic ultrasound 08/31/2018 FINDINGS: Uterus Measurements: 12.3 x 5.1 x 5.6 cm = volume: 183 mL. Uterus is retroverted. Minimal fluid is present  in the endometrial canal. No parenchymal lesions are present. Nabothian cysts noted. Endometrium Thickness: 15.5 mm.  No focal abnormality visualized. Right ovary Measurements: 7.4 x 5.5 x 6.2 cm = volume: 132 mL. Multiloculated cystic lesion in the right adnexa present. This appears to be mostly tubular nature. Normal color Doppler flow and waveforms in the parenchymal components. Tubular components are separate from the area with flow. Left ovary Measurements: 2.9 x 1.8 x 2.3 cm = volume: 6 mL. Normal appearance/no adnexal mass. Pulsed Doppler evaluation demonstrates normal low-resistance arterial and venous waveforms in both ovaries. Other: No free fluid. IMPRESSION: 1. Tubular structure adjacent the right adnexa with debris. This is concerning for hydrosalpinx or hematosalpinx. Infection is not excluded although normal white count noted. Please correlate with exam. Consider gyn consult. Follow-up nonemergent MRI could be used for further evaluation if clinically indicated. 2. This normal color Doppler analysis of the right adnexa with normal arterial and venous waveforms. No evidence for torsion. 3. Normal sonographic appearance of the uterus and left adnexa. These results were called by telephone at the time of interpretation on 09/08/2020 at 7:11 pm to provider [facility] , who verbally acknowledged these results. Electronically Signed   By: San Morelle M.D.   On: 09/08/2020 19:17    Assessment: 50 y.o. G2P2 presenting for scheduled TLH, BS, cystoscopy  Plan: 1) Patient opts for definitive surgical management via hysterectomy. The risks of surgery were discussed in detail with the patient including but not limited to: bleeding which may require transfusion or reoperation; infection which may require antibiotics; injury to bowel, bladder, ureters or other surrounding organs (With a literature reported rate of urinary tract injury of 1% quoted); need for additional procedures including  laparotomy; thromboembolic phenomenon, incisional problems and other postoperative/anesthesia complications.  Patient was also advised that recovery procedure generally involves an overnight stay; and the  expected recovery time after a hysterectomy being in the range of 6-8 weeks.  Likelihood of success in alleviating the patient's symptoms was discussed.  While definitive in regards to issues with menstural bleeding, pelvic pain if present preoperatively may continue and in fact worsen postoperatively.  She is aware that the procedure will render her unable to pursue childbearing in the future.   She was told that she will be contacted by our surgical scheduler regarding the time and date of her surgery; routine preoperative instructions of having nothing to eat or drink after midnight on the day prior to surgery and also coming to the hospital 1.5 hours prior to her time of surgery were also emphasized.  She was told she may be called for a preoperative appointment about a week prior to surgery and will be given further preoperative instructions at that visit.  Routine postoperative instructions will be reviewed with the patient and her family in detail after surgery. Printed patient education handouts about the procedure was given to the patient to review at home.   2) Routine postoperative instructions were reviewed with  the patient and her family in detail today including the expected length of recovery and likely postoperative course.  The patient concurred with the proposed plan, giving informed written consent for the surgery today.  Patient instructed on the importance of being NPO after midnight prior to her procedure.  If warranted preoperative prophylactic antibiotics and SCDs ordered on call to the OR to meet SCIP guidelines and adhere to recommendation laid forth in [place] Number 104 May 2009  "Antibiotic Prophylaxis for Gynecologic Procedures".     Malachy Mood, MD,  [facility], [facility] 09/19/2020, 8:12 AM

## 2020-09-19 NOTE — H&P (View-Only) (Signed)
Obstetrics & Gynecology Surgery H&P    Chief Complaint: Scheduled Surgery   History of Present Illness: Patient is a 50 y.o. G2P2 presenting for scheduled TLH, BS, cystoscopy, for the treatment or further evaluation of postablation tubal syndrome.   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   Lipedema 01/10/2019   Endometriosis determined by laparoscopy 07/06/2018    Stage II 01/26/2013 with both ovarian fossas involved, posterior cul de sac peritoneal window    Atypical chest pain 08/26/2017   Pruritus ani 08/24/2016   History of rectal bleeding 08/24/2016   Obesity (BMI 30-39.9) 11/20/2014   Dermatitis, eczematoid 11/20/2014   External hemorrhoid 11/20/2014   High potassium 11/20/2014   Bursitis 11/20/2014   Migraine 08/28/2014   Gallbladder polyp 09/13/2012   Avitaminosis D 05/19/2009    Past Surgical History:  Past Surgical History:  Procedure Laterality Date   ABLATION  2012   cyst removal   CHOLECYSTECTOMY N/A 10/19/2018   Procedure: LAPAROSCOPIC CHOLECYSTECTOMY;  Surgeon: Jules Husbands, MD;  Location: [facility];  Service: General;  Laterality: N/A;   COLONOSCOPY WITH PROPOFOL N/A 07/21/2020   Procedure: COLONOSCOPY WITH PROPOFOL;  Surgeon: [name], MD;  Location: [place];  Service: Endoscopy;  Laterality: N/A;   LAPAROSCOPIC OVARIAN CYSTECTOMY Right 10/19/2018   Procedure: LAPAROSCOPIC RIGHT OVARIAN CYSTECTOMY;   Surgeon: Malachy Mood, MD;  Location: [facility];  Service: Gynecology;  Laterality: Right;   NECK SURGERY  02/22/2018   Artificial Disc replacement    OVARIAN CYST REMOVAL     POLYPECTOMY N/A 07/21/2020   Procedure: POLYPECTOMY;  Surgeon: [name], MD;  Location: [place];  Service: Endoscopy;  Laterality: N/A;    Family History:  Family History  Problem Relation Age of Onset   Lung cancer Father    Diabetes Mother    Stroke Mother    Diabetes Maternal Grandmother    Colon cancer Neg Hx    Breast cancer Neg Hx     Social History:  Social History   Socioeconomic History   Marital status: Married    Spouse name: Not on file   Number of children: Not on file   Years of education: Not on file   Highest education level: Not on file  Occupational History   Not on file  Tobacco Use   Smoking status: Former    Types: Cigarettes    Quit date: 02/01/1997    Years since quitting: 23.6   Smokeless tobacco: Never  Vaping Use   Vaping Use: Never used  Substance and Sexual Activity   Alcohol use: Yes    Comment: occassional    Drug use: No   Sexual activity: Yes    Birth control/protection: None  Other Topics Concern   Not on file  Social History Narrative   Not on file   Social Determinants of Health   Financial Resource Strain: Not on file  Food Insecurity: Not on file  Transportation Needs: Not on file  Physical  Activity: Not on file  Stress: Not on file  Social Connections: Not on file  Intimate Partner Violence: Not on file    Allergies:  No Known Allergies  Medications: Prior to Admission medications   Medication Sig Start Date End Date Taking? Authorizing Provider  acetaminophen (TYLENOL) 500 MG tablet Take 1,000 mg by mouth every 6 (six) hours as needed for moderate pain, fever or headache.    [provider]  clonazePAM (KLONOPIN) 0.5 MG tablet TAKE 1 TABLET BY MOUTH TWICE DAILY AS NEEDED FOR ANXIETY OR PALPITATIONS 04/16/19    Mar Daring, PA-C  ibuprofen (ADVIL) 200 MG tablet Take 400-800 mg by mouth every 6 (six) hours as needed for mild pain or moderate pain.    [provider]  oxyCODONE-acetaminophen (PERCOCET/ROXICET) 5-325 MG tablet Take 1 tablet by mouth every 6 (six) hours as needed for severe pain. Patient not taking: Reported on 09/15/2020 09/08/20   Cuthriell, Charline Bills, PA-C  phenylephrine (SUDAFED PE) 10 MG TABS tablet Take 10 mg by mouth every 4 (four) hours as needed (nasal congestion).    [provider]  polyethylene glycol-electrolytes (GAVILYTE-N WITH FLAVOR PACK) 420 g solution Drink one 8 oz glass every 20 mins until entire container is finished starting at 5:00pm on 07/20/20 Patient not taking: Reported on 09/15/2020 07/16/20   [name], MD  SUMAtriptan (IMITREX) 50 MG tablet Take 1 tablet (50 mg total) by mouth every 2 (two) hours as needed for migraine. May repeat in 2 hours if headache persists or recurs. 01/01/20   Mar Daring, PA-C    Physical Exam Vitals: Blood pressure 112/60, height '5\' 7"'$  (1.702 m), weight 190 lb (86.2 kg), last menstrual period 09/08/2020. General: NAD HEENT: normocephalic, anicteric Pulmonary: No increased work of breathing Cardiovascular: RRR, distal pulses 2+ Extremities: no edema, erythema, or tenderness Neurologic: Grossly intact Psychiatric: mood appropriate, affect full  Imaging CT ABDOMEN PELVIS W CONTRAST  Result Date: 09/08/2020 CLINICAL DATA:  Right lower quadrant pain EXAM: CT ABDOMEN AND PELVIS WITH CONTRAST TECHNIQUE: Multidetector CT imaging of the abdomen and pelvis was performed using the standard protocol following bolus administration of intravenous contrast. CONTRAST:  47m OMNIPAQUE IOHEXOL 350 MG/ML SOLN COMPARISON:  CT 11/22/2011 FINDINGS: Lower chest: No acute abnormality. Hepatobiliary: No focal liver abnormality is seen. Status post cholecystectomy. No biliary dilatation. Pancreas: Unremarkable. No pancreatic  ductal dilatation or surrounding inflammatory changes. Spleen: Normal in size without focal abnormality. Adrenals/Urinary Tract: Adrenal glands are unremarkable. Kidneys are normal, without renal calculi, focal lesion, or hydronephrosis. Bladder is unremarkable. Stomach/Bowel: Stomach is within normal limits. Appendix appears normal. No evidence of bowel wall thickening, distention, or inflammatory changes. Diverticular disease of the colon without acute inflammatory change. Vascular/Lymphatic: No significant vascular findings are present. No enlarged abdominal or pelvic lymph nodes. Reproductive: Endometrial thickening at the fundus up to 20 mm. Multi loculated cystic mass in the right adnexa measuring 7.2 by 5.1 by 6 cm. Cervical enlargement. Other: Trace free fluid. No free air. Small fat containing umbilical and supraumbilical ventral hernia Musculoskeletal: No acute or significant osseous findings. IMPRESSION: 1. Negative for acute appendicitis. 2. 7.2 cm multi loculated cystic mass in the right adnexa, incompletely evaluated. Recommend pelvic ultrasound for further evaluation. 3. Endometrial thickening up to 20 mm, this may also be evaluated at pelvic ultrasound 4. Trace fluid in the pelvis 5. Diverticular disease of the colon without acute inflammation Electronically Signed   By: KDonavan FoilM.D.   On: 09/08/2020 17:15  US PELVIC COMPLETE W TRANSVAGINAL AND TORSION R/O  Result Date: 09/08/2020 CLINICAL DATA:  Pelvic pain for 1 day. EXAM: TRANSABDOMINAL ULTRASOUND OF PELVIS DOPPLER ULTRASOUND OF OVARIES TECHNIQUE: Transabdominal ultrasound examination of the pelvis was performed including evaluation of the uterus, ovaries, adnexal regions, and pelvic cul-de-sac. Color and duplex Doppler ultrasound was utilized to evaluate blood flow to the ovaries. COMPARISON:  Pelvic ultrasound 08/31/2018 FINDINGS: Uterus Measurements: 12.3 x 5.1 x 5.6 cm = volume: 183 mL. Uterus is retroverted. Minimal fluid is present  in the endometrial canal. No parenchymal lesions are present. Nabothian cysts noted. Endometrium Thickness: 15.5 mm.  No focal abnormality visualized. Right ovary Measurements: 7.4 x 5.5 x 6.2 cm = volume: 132 mL. Multiloculated cystic lesion in the right adnexa present. This appears to be mostly tubular nature. Normal color Doppler flow and waveforms in the parenchymal components. Tubular components are separate from the area with flow. Left ovary Measurements: 2.9 x 1.8 x 2.3 cm = volume: 6 mL. Normal appearance/no adnexal mass. Pulsed Doppler evaluation demonstrates normal low-resistance arterial and venous waveforms in both ovaries. Other: No free fluid. IMPRESSION: 1. Tubular structure adjacent the right adnexa with debris. This is concerning for hydrosalpinx or hematosalpinx. Infection is not excluded although normal white count noted. Please correlate with exam. Consider gyn consult. Follow-up nonemergent MRI could be used for further evaluation if clinically indicated. 2. This normal color Doppler analysis of the right adnexa with normal arterial and venous waveforms. No evidence for torsion. 3. Normal sonographic appearance of the uterus and left adnexa. These results were called by telephone at the time of interpretation on 09/08/2020 at 7:11 pm to provider [facility] , who verbally acknowledged these results. Electronically Signed   By: San Morelle M.D.   On: 09/08/2020 19:17    Assessment: 50 y.o. G2P2 presenting for scheduled TLH, BS, cystoscopy  Plan: 1) Patient opts for definitive surgical management via hysterectomy. The risks of surgery were discussed in detail with the patient including but not limited to: bleeding which may require transfusion or reoperation; infection which may require antibiotics; injury to bowel, bladder, ureters or other surrounding organs (With a literature reported rate of urinary tract injury of 1% quoted); need for additional procedures including  laparotomy; thromboembolic phenomenon, incisional problems and other postoperative/anesthesia complications.  Patient was also advised that recovery procedure generally involves an overnight stay; and the  expected recovery time after a hysterectomy being in the range of 6-8 weeks.  Likelihood of success in alleviating the patient's symptoms was discussed.  While definitive in regards to issues with menstural bleeding, pelvic pain if present preoperatively may continue and in fact worsen postoperatively.  She is aware that the procedure will render her unable to pursue childbearing in the future.   She was told that she will be contacted by our surgical scheduler regarding the time and date of her surgery; routine preoperative instructions of having nothing to eat or drink after midnight on the day prior to surgery and also coming to the hospital 1.5 hours prior to her time of surgery were also emphasized.  She was told she may be called for a preoperative appointment about a week prior to surgery and will be given further preoperative instructions at that visit.  Routine postoperative instructions will be reviewed with the patient and her family in detail after surgery. Printed patient education handouts about the procedure was given to the patient to review at home.   2) Routine postoperative instructions were reviewed with  the patient and her family in detail today including the expected length of recovery and likely postoperative course.  The patient concurred with the proposed plan, giving informed written consent for the surgery today.  Patient instructed on the importance of being NPO after midnight prior to her procedure.  If warranted preoperative prophylactic antibiotics and SCDs ordered on call to the OR to meet SCIP guidelines and adhere to recommendation laid forth in [place] Number 104 May 2009  "Antibiotic Prophylaxis for Gynecologic Procedures".     Malachy Mood, MD,  [facility], [facility] 09/19/2020, 8:12 AM

## 2020-09-23 ENCOUNTER — Encounter: Payer: Self-pay | Admitting: Urgent Care

## 2020-09-23 ENCOUNTER — Other Ambulatory Visit
Admission: RE | Admit: 2020-09-23 | Discharge: 2020-09-23 | Disposition: A | Discharge status: Home / Self Care | Payer: Federal, State, Local not specified - PPO | Source: Ambulatory Visit | Attending: Obstetrics and Gynecology | Admitting: Obstetrics and Gynecology

## 2020-09-23 ENCOUNTER — Other Ambulatory Visit: Payer: Self-pay

## 2020-09-23 DIAGNOSIS — Z20822 Contact with and (suspected) exposure to covid-19: Secondary | ICD-10-CM | POA: Insufficient documentation

## 2020-09-23 DIAGNOSIS — Z01812 Encounter for preprocedural laboratory examination: Secondary | ICD-10-CM | POA: Diagnosis not present

## 2020-09-23 LAB — CBC
HCT: 43.9 % (ref 36.0–46.0)
Hemoglobin: 14.3 g/dL (ref 12.0–15.0)
MCH: 29.4 pg (ref 26.0–34.0)
MCHC: 32.6 g/dL (ref 30.0–36.0)
MCV: 90.1 fL (ref 80.0–100.0)
Platelets: 172 10*3/uL (ref 150–400)
RBC: 4.87 MIL/uL (ref 3.87–5.11)
RDW: 12.9 % (ref 11.5–15.5)
WBC: 4.6 10*3/uL (ref 4.0–10.5)
nRBC: 0 % (ref 0.0–0.2)

## 2020-09-23 LAB — COMPREHENSIVE METABOLIC PANEL
ALT: 27 U/L (ref 0–44)
AST: 23 U/L (ref 15–41)
Albumin: 4 g/dL (ref 3.5–5.0)
Alkaline Phosphatase: 47 U/L (ref 38–126)
Anion gap: 10 (ref 5–15)
BUN: 18 mg/dL (ref 6–20)
CO2: 23 mmol/L (ref 22–32)
Calcium: 9.1 mg/dL (ref 8.9–10.3)
Chloride: 104 mmol/L (ref 98–111)
Creatinine, Ser: 0.78 mg/dL (ref 0.44–1.00)
GFR, Estimated: >60 mL/min (ref >60)
Glucose, Bld: 109 mg/dL — ABNORMAL HIGH (ref 70–99)
Potassium: 4 mmol/L (ref 3.5–5.1)
Sodium: 137 mmol/L (ref 135–145)
Total Bilirubin: 0.7 mg/dL (ref 0.3–1.2)
Total Protein: 7.2 g/dL (ref 6.5–8.1)

## 2020-09-23 LAB — SARS CORONAVIRUS 2 (TAT 6-24 HRS): SARS Coronavirus 2: NEGATIVE

## 2020-09-23 LAB — TYPE AND SCREEN
ABO/RH(D): O POS
Antibody Screen: NEGATIVE

## 2020-09-24 LAB — RPR: RPR Ser Ql: NONREACTIVE

## 2020-09-25 ENCOUNTER — Other Ambulatory Visit: Payer: Self-pay

## 2020-09-25 ENCOUNTER — Ambulatory Visit: Payer: Federal, State, Local not specified - PPO | Admitting: Certified Registered"

## 2020-09-25 ENCOUNTER — Encounter: Payer: Self-pay | Admitting: Obstetrics and Gynecology

## 2020-09-25 ENCOUNTER — Observation Stay
Admission: RE | Admit: 2020-09-25 | Discharge: 2020-09-26 | Disposition: A | Discharge status: Home / Self Care | Payer: Federal, State, Local not specified - PPO | Attending: Obstetrics and Gynecology | Admitting: Obstetrics and Gynecology

## 2020-09-25 ENCOUNTER — Encounter
Admission: RE | Disposition: A | Discharge status: Home / Self Care | Payer: Self-pay | Source: Home / Self Care | Attending: Obstetrics and Gynecology

## 2020-09-25 ENCOUNTER — Ambulatory Visit: Payer: Federal, State, Local not specified - PPO

## 2020-09-25 DIAGNOSIS — Z87891 Personal history of nicotine dependence: Secondary | ICD-10-CM | POA: Insufficient documentation

## 2020-09-25 DIAGNOSIS — N857 Hematometra: Secondary | ICD-10-CM | POA: Insufficient documentation

## 2020-09-25 DIAGNOSIS — N802 Endometriosis of fallopian tube: Secondary | ICD-10-CM | POA: Diagnosis not present

## 2020-09-25 DIAGNOSIS — N9985 Post endometrial ablation syndrome: Secondary | ICD-10-CM | POA: Diagnosis not present

## 2020-09-25 DIAGNOSIS — R102 Pelvic and perineal pain unspecified side: Secondary | ICD-10-CM

## 2020-09-25 DIAGNOSIS — N838 Other noninflammatory disorders of ovary, fallopian tube and broad ligament: Secondary | ICD-10-CM | POA: Insufficient documentation

## 2020-09-25 DIAGNOSIS — N8502 Endometrial intraepithelial neoplasia [EIN]: Secondary | ICD-10-CM | POA: Diagnosis not present

## 2020-09-25 DIAGNOSIS — N83201 Unspecified ovarian cyst, right side: Secondary | ICD-10-CM | POA: Diagnosis not present

## 2020-09-25 DIAGNOSIS — N800 Endometriosis of uterus: Secondary | ICD-10-CM | POA: Diagnosis not present

## 2020-09-25 DIAGNOSIS — N801 Endometriosis of ovary: Secondary | ICD-10-CM | POA: Diagnosis not present

## 2020-09-25 DIAGNOSIS — N80129 Deep endometriosis of ovary, unspecified ovary: Secondary | ICD-10-CM

## 2020-09-25 DIAGNOSIS — Z90710 Acquired absence of both cervix and uterus: Secondary | ICD-10-CM | POA: Diagnosis present

## 2020-09-25 DIAGNOSIS — N946 Dysmenorrhea, unspecified: Secondary | ICD-10-CM | POA: Diagnosis present

## 2020-09-25 DIAGNOSIS — N939 Abnormal uterine and vaginal bleeding, unspecified: Secondary | ICD-10-CM | POA: Diagnosis not present

## 2020-09-25 DIAGNOSIS — N809 Endometriosis, unspecified: Secondary | ICD-10-CM | POA: Diagnosis not present

## 2020-09-25 HISTORY — PX: CYSTOSCOPY: SHX5120

## 2020-09-25 HISTORY — PX: CYSTOSCOPY W/ URETERAL STENT PLACEMENT: SHX1429

## 2020-09-25 HISTORY — PX: TOTAL LAPAROSCOPIC HYSTERECTOMY WITH SALPINGECTOMY: SHX6742

## 2020-09-25 LAB — ABO/RH: ABO/RH(D): O POS

## 2020-09-25 LAB — POCT PREGNANCY, URINE: Preg Test, Ur: NEGATIVE

## 2020-09-25 SURGERY — HYSTERECTOMY, TOTAL, LAPAROSCOPIC, WITH SALPINGECTOMY
Anesthesia: General | Laterality: Right

## 2020-09-25 MED ORDER — DEXTROSE-NACL 5-0.45 % IV SOLN: INTRAVENOUS | Status: DC

## 2020-09-25 MED ORDER — MIDAZOLAM HCL 2 MG/2ML IJ SOLN
INTRAMUSCULAR | Status: DC | PRN
  Administered 2020-09-25: 2 mg via INTRAVENOUS

## 2020-09-25 MED ORDER — CHLORHEXIDINE GLUCONATE 0.12 % MT SOLN
OROMUCOSAL | Status: AC
  Filled 2020-09-25: qty 15

## 2020-09-25 MED ORDER — ACETAMINOPHEN 10 MG/ML IV SOLN
INTRAVENOUS | Status: AC
  Filled 2020-09-25: qty 100

## 2020-09-25 MED ORDER — ROCURONIUM BROMIDE 100 MG/10ML IV SOLN
INTRAVENOUS | Status: DC | PRN
  Administered 2020-09-25: 30 mg via INTRAVENOUS
  Administered 2020-09-25: 20 mg via INTRAVENOUS
  Administered 2020-09-25: 50 mg via INTRAVENOUS

## 2020-09-25 MED ORDER — ORAL CARE MOUTH RINSE: 15.0000 mL | Freq: Once | OROMUCOSAL | Status: AC

## 2020-09-25 MED ORDER — BUPIVACAINE HCL (PF) 0.5 % IJ SOLN
INTRAMUSCULAR | Status: AC
  Filled 2020-09-25: qty 30

## 2020-09-25 MED ORDER — GLYCOPYRROLATE 0.2 MG/ML IJ SOLN
INTRAMUSCULAR | Status: DC | PRN
  Administered 2020-09-25: .2 mg via INTRAVENOUS

## 2020-09-25 MED ORDER — CHLORHEXIDINE GLUCONATE 0.12 % MT SOLN: 15.0000 mL | Freq: Once | OROMUCOSAL | Status: AC

## 2020-09-25 MED ORDER — 0.9 % SODIUM CHLORIDE (POUR BTL) OPTIME
TOPICAL | Status: DC | PRN
  Administered 2020-09-25: 1000 mL

## 2020-09-25 MED ORDER — OXYCODONE-ACETAMINOPHEN 5-325 MG PO TABS
1.0000 | ORAL_TABLET | ORAL | Status: DC | PRN
Start: 2020-09-25 — End: 2020-09-26
  Administered 2020-09-25 – 2020-09-26 (×4): 1 via ORAL
  Filled 2020-09-25 (×4): qty 1

## 2020-09-25 MED ORDER — MENTHOL 3 MG MT LOZG
1.0000 | LOZENGE | OROMUCOSAL | Status: DC | PRN
  Filled 2020-09-25: qty 9

## 2020-09-25 MED ORDER — ONDANSETRON HCL 4 MG/2ML IJ SOLN
INTRAMUSCULAR | Status: DC | PRN
  Administered 2020-09-25 (×2): 4 mg via INTRAVENOUS

## 2020-09-25 MED ORDER — OXYCODONE HCL 5 MG/5ML PO SOLN: 5.0000 mg | Freq: Once | ORAL | Status: DC | PRN

## 2020-09-25 MED ORDER — ALUM & MAG HYDROXIDE-SIMETH 200-200-20 MG/5ML PO SUSP: 30.0000 mL | ORAL | Status: DC | PRN

## 2020-09-25 MED ORDER — ACETAMINOPHEN 10 MG/ML IV SOLN: 1000.0000 mg | Freq: Once | INTRAVENOUS | Status: DC | PRN

## 2020-09-25 MED ORDER — ONDANSETRON HCL 4 MG/2ML IJ SOLN: 4.0000 mg | Freq: Once | INTRAMUSCULAR | Status: DC | PRN

## 2020-09-25 MED ORDER — EPHEDRINE SULFATE 50 MG/ML IJ SOLN
INTRAMUSCULAR | Status: DC | PRN
  Administered 2020-09-25 (×2): 5 mg via INTRAVENOUS

## 2020-09-25 MED ORDER — HYDROMORPHONE HCL 1 MG/ML IJ SOLN
INTRAMUSCULAR | Status: AC
  Filled 2020-09-25: qty 1

## 2020-09-25 MED ORDER — FENTANYL CITRATE (PF) 100 MCG/2ML IJ SOLN
INTRAMUSCULAR | Status: AC
  Filled 2020-09-25: qty 2

## 2020-09-25 MED ORDER — LIDOCAINE HCL (CARDIAC) PF 100 MG/5ML IV SOSY
PREFILLED_SYRINGE | INTRAVENOUS | Status: DC | PRN
  Administered 2020-09-25: 100 mg via INTRAVENOUS
  Administered 2020-09-25: 50 mg via INTRAVENOUS

## 2020-09-25 MED ORDER — OXYCODONE HCL 5 MG PO TABS: 5.0000 mg | ORAL_TABLET | Freq: Once | ORAL | Status: DC | PRN

## 2020-09-25 MED ORDER — FENTANYL CITRATE (PF) 100 MCG/2ML IJ SOLN
25.0000 ug | INTRAMUSCULAR | Status: DC | PRN
  Administered 2020-09-25 (×3): 25 ug via INTRAVENOUS

## 2020-09-25 MED ORDER — KETOROLAC TROMETHAMINE 30 MG/ML IJ SOLN
30.0000 mg | Freq: Four times a day (QID) | INTRAMUSCULAR | Status: DC
  Administered 2020-09-25 – 2020-09-26 (×3): 30 mg via INTRAVENOUS
  Filled 2020-09-25 (×3): qty 1

## 2020-09-25 MED ORDER — ONDANSETRON HCL 4 MG/2ML IJ SOLN: 4.0000 mg | Freq: Four times a day (QID) | INTRAMUSCULAR | Status: DC | PRN

## 2020-09-25 MED ORDER — PROPOFOL 10 MG/ML IV BOLUS
INTRAVENOUS | Status: DC | PRN
  Administered 2020-09-25: 180 mg via INTRAVENOUS

## 2020-09-25 MED ORDER — DEXAMETHASONE SODIUM PHOSPHATE 10 MG/ML IJ SOLN
INTRAMUSCULAR | Status: DC | PRN
Start: 2020-09-25 — End: 2020-09-25
  Administered 2020-09-25: 10 mg via INTRAVENOUS

## 2020-09-25 MED ORDER — APREPITANT 40 MG PO CAPS
ORAL_CAPSULE | ORAL | Status: AC
  Administered 2020-09-25: 40 mg via ORAL
  Filled 2020-09-25: qty 1

## 2020-09-25 MED ORDER — FAMOTIDINE 20 MG PO TABS
ORAL_TABLET | ORAL | Status: AC
  Filled 2020-09-25: qty 1

## 2020-09-25 MED ORDER — CHLORHEXIDINE GLUCONATE 0.12 % MT SOLN
OROMUCOSAL | Status: AC
  Administered 2020-09-25: 15 mL via OROMUCOSAL
  Filled 2020-09-25: qty 15

## 2020-09-25 MED ORDER — SUGAMMADEX SODIUM 200 MG/2ML IV SOLN
INTRAVENOUS | Status: DC | PRN
  Administered 2020-09-25: 200 mg via INTRAVENOUS

## 2020-09-25 MED ORDER — FENTANYL CITRATE (PF) 100 MCG/2ML IJ SOLN
INTRAMUSCULAR | Status: DC | PRN
  Administered 2020-09-25 (×2): 50 ug via INTRAVENOUS

## 2020-09-25 MED ORDER — BUPIVACAINE HCL 0.5 % IJ SOLN
INTRAMUSCULAR | Status: DC | PRN
  Administered 2020-09-25: 9 mL

## 2020-09-25 MED ORDER — LACTATED RINGERS IV SOLN: INTRAVENOUS | Status: DC

## 2020-09-25 MED ORDER — APREPITANT 40 MG PO CAPS: 40.0000 mg | ORAL_CAPSULE | Freq: Once | ORAL | Status: AC

## 2020-09-25 MED ORDER — PROPOFOL 500 MG/50ML IV EMUL
INTRAVENOUS | Status: DC | PRN
  Administered 2020-09-25: 165 ug/kg/min via INTRAVENOUS
  Administered 2020-09-25: 150 ug/kg/min via INTRAVENOUS

## 2020-09-25 MED ORDER — PHENYLEPHRINE HCL (PRESSORS) 10 MG/ML IV SOLN
INTRAVENOUS | Status: DC | PRN
  Administered 2020-09-25 (×2): 100 ug via INTRAVENOUS

## 2020-09-25 MED ORDER — FENTANYL CITRATE (PF) 100 MCG/2ML IJ SOLN
INTRAMUSCULAR | Status: AC
  Administered 2020-09-25: 50 ug via INTRAVENOUS
  Filled 2020-09-25: qty 2

## 2020-09-25 MED ORDER — IBUPROFEN 600 MG PO TABS: 600.0000 mg | ORAL_TABLET | Freq: Four times a day (QID) | ORAL | Status: DC

## 2020-09-25 MED ORDER — ACETAMINOPHEN 10 MG/ML IV SOLN
INTRAVENOUS | Status: DC | PRN
  Administered 2020-09-25: 1000 mg via INTRAVENOUS

## 2020-09-25 MED ORDER — POVIDONE-IODINE 10 % EX SWAB: 2.0000 "application " | Freq: Once | CUTANEOUS | Status: DC

## 2020-09-25 MED ORDER — PROPOFOL 500 MG/50ML IV EMUL
INTRAVENOUS | Status: AC
  Filled 2020-09-25: qty 100

## 2020-09-25 MED ORDER — SODIUM CHLORIDE (PF) 0.9 % IJ SOLN
INTRAMUSCULAR | Status: DC | PRN
  Administered 2020-09-25: 10 mL

## 2020-09-25 MED ORDER — MORPHINE SULFATE (PF) 2 MG/ML IV SOLN
1.0000 mg | INTRAVENOUS | Status: DC | PRN
  Administered 2020-09-25 (×2): 1 mg via INTRAVENOUS
  Filled 2020-09-25: qty 1

## 2020-09-25 MED ORDER — CEFAZOLIN SODIUM-DEXTROSE 2-4 GM/100ML-% IV SOLN
2.0000 g | INTRAVENOUS | Status: AC
  Administered 2020-09-25: 2 g via INTRAVENOUS

## 2020-09-25 MED ORDER — DEXMEDETOMIDINE (PRECEDEX) IN NS 20 MCG/5ML (4 MCG/ML) IV SYRINGE
PREFILLED_SYRINGE | INTRAVENOUS | Status: DC | PRN
  Administered 2020-09-25: 8 ug via INTRAVENOUS
  Administered 2020-09-25: 12 ug via INTRAVENOUS
  Administered 2020-09-25: 8 ug via INTRAVENOUS

## 2020-09-25 MED ORDER — FENTANYL CITRATE (PF) 100 MCG/2ML IJ SOLN
INTRAMUSCULAR | Status: AC
  Administered 2020-09-25: 25 ug via INTRAVENOUS
  Filled 2020-09-25: qty 2

## 2020-09-25 MED ORDER — MIDAZOLAM HCL 2 MG/2ML IJ SOLN
INTRAMUSCULAR | Status: AC
  Filled 2020-09-25: qty 2

## 2020-09-25 MED ORDER — CEFAZOLIN SODIUM-DEXTROSE 2-4 GM/100ML-% IV SOLN
INTRAVENOUS | Status: AC
  Filled 2020-09-25: qty 100

## 2020-09-25 MED ORDER — ONDANSETRON HCL 4 MG PO TABS: 4.0000 mg | ORAL_TABLET | Freq: Four times a day (QID) | ORAL | Status: DC | PRN

## 2020-09-25 MED ORDER — SODIUM CHLORIDE 0.9 % IR SOLN
Status: DC | PRN
  Administered 2020-09-25: 1000 mL via INTRAVESICAL

## 2020-09-25 MED ORDER — FAMOTIDINE 20 MG PO TABS
20.0000 mg | ORAL_TABLET | Freq: Once | ORAL | Status: AC
  Administered 2020-09-25: 20 mg via ORAL

## 2020-09-25 MED ORDER — HEMOSTATIC AGENTS (NO CHARGE) OPTIME
TOPICAL | Status: DC | PRN
  Administered 2020-09-25: 1 via TOPICAL

## 2020-09-25 MED ORDER — HYDROMORPHONE HCL 1 MG/ML IJ SOLN
INTRAMUSCULAR | Status: DC | PRN
  Administered 2020-09-25: 1 mg via INTRAVENOUS

## 2020-09-25 SURGICAL SUPPLY — 62 items
APPLICATOR ARISTA FLEXITIP XL (MISCELLANEOUS) IMPLANT
BAG URINE DRAIN 2000ML AR STRL (UROLOGICAL SUPPLIES) ×4 IMPLANT
BLADE SURG SZ11 CARB STEEL (BLADE) ×4 IMPLANT
CANISTER SUCT 1200ML W/VALVE (MISCELLANEOUS) ×4 IMPLANT
CATH FOL 2WAY LX 18X5 (CATHETERS) ×4 IMPLANT
CATH FOLEY 2WAY  5CC 16FR (CATHETERS) ×1
CATH URETHERAL OPEN END 7FR (CATHETERS) ×4 IMPLANT
CATH URTH 16FR FL 2W BLN LF (CATHETERS) ×3 IMPLANT
CHLORAPREP W/TINT 26 (MISCELLANEOUS) ×4 IMPLANT
DEFOGGER SCOPE WARMER CLEARIFY (MISCELLANEOUS) ×4 IMPLANT
DERMABOND ADVANCED (GAUZE/BANDAGES/DRESSINGS) ×1
DERMABOND ADVANCED .7 DNX12 (GAUZE/BANDAGES/DRESSINGS) ×3 IMPLANT
DEVICE SUTURE ENDOST 10MM (ENDOMECHANICALS) ×4 IMPLANT
DRAPE C-ARM XRAY 36X54 (DRAPES) ×4 IMPLANT
GAUZE 4X4 16PLY ~~LOC~~+RFID DBL (SPONGE) ×12 IMPLANT
GLOVE SURG ENC MOIS LTX SZ7 (GLOVE) ×16 IMPLANT
GLOVE SURG UNDER LTX SZ7.5 (GLOVE) ×16 IMPLANT
GOWN STRL REUS W/ TWL LRG LVL3 (GOWN DISPOSABLE) ×6 IMPLANT
GOWN STRL REUS W/ TWL XL LVL3 (GOWN DISPOSABLE) ×3 IMPLANT
GOWN STRL REUS W/TWL LRG LVL3 (GOWN DISPOSABLE) ×2
GOWN STRL REUS W/TWL XL LVL3 (GOWN DISPOSABLE) ×1
GRASPER SUT TROCAR 14GX15 (MISCELLANEOUS) ×4 IMPLANT
HEMOSTAT ARISTA ABSORB 1G (HEMOSTASIS) ×4 IMPLANT
HEMOSTAT ARISTA ABSORB 3G PWDR (HEMOSTASIS) IMPLANT
IRRIGATION STRYKERFLOW (MISCELLANEOUS) ×3 IMPLANT
IRRIGATOR STRYKERFLOW (MISCELLANEOUS) ×4
IV LACTATED RINGERS 1000ML (IV SOLUTION) ×8 IMPLANT
IV NS 1000ML (IV SOLUTION) ×1
IV NS 1000ML BAXH (IV SOLUTION) ×3 IMPLANT
KIT PINK PAD W/HEAD ARE REST (MISCELLANEOUS) ×4
KIT PINK PAD W/HEAD ARM REST (MISCELLANEOUS) ×3 IMPLANT
LABEL OR SOLS (LABEL) ×4 IMPLANT
MANIFOLD NEPTUNE II (INSTRUMENTS) ×4 IMPLANT
MANIPULATOR VCARE LG CRV RETR (MISCELLANEOUS) ×4 IMPLANT
MANIPULATOR VCARE SML CRV RETR (MISCELLANEOUS) IMPLANT
MANIPULATOR VCARE STD CRV RETR (MISCELLANEOUS) IMPLANT
NS IRRIG 1000ML POUR BTL (IV SOLUTION) ×4 IMPLANT
NS IRRIG 500ML POUR BTL (IV SOLUTION) ×4 IMPLANT
OCCLUDER COLPOPNEUMO (BALLOONS) ×4 IMPLANT
PACK CYSTO (CUSTOM PROCEDURE TRAY) ×4 IMPLANT
PACK GYN LAPAROSCOPIC (MISCELLANEOUS) ×4 IMPLANT
PAD OB MATERNITY 4.3X12.25 (PERSONAL CARE ITEMS) ×4 IMPLANT
PAD PREP 24X41 OB/GYN DISP (PERSONAL CARE ITEMS) ×4 IMPLANT
SCISSORS METZENBAUM CVD 33 (INSTRUMENTS) ×4 IMPLANT
SCRUB EXIDINE 4% CHG 4OZ (MISCELLANEOUS) ×4 IMPLANT
SET CYSTO W/LG BORE CLAMP LF (SET/KITS/TRAYS/PACK) ×4 IMPLANT
SHEARS HARMONIC ACE PLUS 36CM (ENDOMECHANICALS) ×4 IMPLANT
SLEEVE ENDOPATH XCEL 5M (ENDOMECHANICALS) ×4 IMPLANT
STENT URET 6FRX26 CONTOUR (STENTS) ×4 IMPLANT
STRAP SAFETY 5IN WIDE (MISCELLANEOUS) ×4 IMPLANT
SURGILUBE 2OZ TUBE FLIPTOP (MISCELLANEOUS) ×4 IMPLANT
SUT ENDO VLOC 180-0-8IN (SUTURE) ×4 IMPLANT
SUT MNCRL 4-0 (SUTURE) ×2
SUT MNCRL 4-0 27XMFL (SUTURE) ×6
SUT VIC AB 0 CT1 36 (SUTURE) ×4 IMPLANT
SUTURE MNCRL 4-0 27XMF (SUTURE) ×6 IMPLANT
SYR 10ML LL (SYRINGE) ×8 IMPLANT
SYR 50ML LL SCALE MARK (SYRINGE) ×4 IMPLANT
TROCAR ENDO BLADELESS 11MM (ENDOMECHANICALS) ×4 IMPLANT
TROCAR XCEL NON-BLD 5MMX100MML (ENDOMECHANICALS) ×4 IMPLANT
TUBING EVAC SMOKE HEATED PNEUM (TUBING) ×4 IMPLANT
WATER STERILE IRR 500ML POUR (IV SOLUTION) ×4 IMPLANT

## 2020-09-25 NOTE — Op Note (Signed)
Preoperative Diagnosis: 1) 50 y.o. post-ablation tubal sterilizaton syndrome (PATSS) 2) Pelvic pain 3) Endometriosis 4) Hematometra   Postoperative Diagnosis: 1) 50 y.o. post-ablation tubal sterilizaton syndrome (PATSS) 2) Pelvic pain 3) Endometriosis 4) Hematometra   Operation Performed: Total laparoscopic hysterectomy, bilateral salpingectomy, right oophorectomy and cystoscopy.  Right ureteral stent placement by Dr. Nickolas Madrid  Indication: Worsening pelvic pain with CT imaging consistent with post-ablation tubal sterilizaton syndrome (PATSS)  Surgeon: Malachy Mood, MD  Assistant: Teresa Coombs, MD this surgery required a high level surgical assistant with none other readily available  Anesthesia: General   Preoperative Antibiotics: none  Estimated Blood Loss: 200 mL  IV Fluids: 1532m  Drains or Tubes: Foley to gravity drainage, right ureteral stent  Implants: none  Specimens Removed: Uterus, cervix, bilateral fallopian tubes, right ovary  Complications: none  Intraoperative Findings: Cervix with several nabothian cysts, otherwise normal.  The uterus was sounded   Patient Condition: stable  Procedure in Detail:  Patient was taken to the operating room where she was administered general anesthesia.  She was positioned in the dorsal lithotomy position utilizing Allen stirups, prepped and draped in the usual sterile fashion.  Prior to proceeding with procedure a time out was performed.  Attention was turned to the patient's pelvis.  An indwelling foley catheter was placed to decompress the patient's bladder.  An operative speculum was placed to allow visualization of the cervix.  The anterior lip of the cervix was grasped with a single tooth tenaculum, and a large V-care uterine manipulator was placed to allow manipulation of the uterus.  The V-care was sown into the cervix as given the prior ablation the endometrial cavity was not able to be completely cannulated.  The  operative speculum and single tooth tenaculum were then removed.  Attention was turned to the patient's abdomen.  The umbilicus was infiltrated with 1% Sensorcaine, before making a stab incision using an 11 blade scalpel.  A 522mExcel trocar was then used to gain direct entry into the peritoneal cavity utilizing the camera to visualize progress of the trocar during placement.  Once peritoneal entry had been achieved, insufflation was started and pneumoperitoneum established at a pressure of 1581m.    One left and one right lower quadrant site were then injected with 1% Sensorcaine and a stab incision was made using an 11 blade scalpel.  Two additional 5mm59mcel trocars were placed through these incisions under direct visualization, the umbilical trocar site was stepped up to an 87m 53ml trocar. General inspection of the abdomen revealed the above noted findings. Given the significant adhesive disease noted on the right pelvic sidewall the decision was made to consult urology for intraoperative stent placement prior to attempting resection.  The left tube was identified and grasped at its fimbriated end.  The tube was transected from its attachments to the ovary and mesosalpinx using a 5mm H57monic scalpel.  The utero ovarian ligament was identified ligated and transected using the Harmonic scalpel. The round ligament was then likewise ligated and transected.  The anterior leaf of the broad ligament was dissected down to the level of the internal cervical os and a bladder flap was started.  There was dense adhesive disease on the lower left uterus and side-wall that were encountered.  The posterior leaf of the broad ligament was dissected down to the utero-sacral ligament.  The uterine artery was skeletonized before being ligated and transected using the Harmonic scalpel with cephelad pressure applied to the V-care device to assure  lateralization of the ureter.  A bite was then taken with Harmonic medial to  transected portio of uterine artery to further lateralize the ureter and vessel off the V-care cup.    At this point Dr. Diamantina Providence came in to place the right ureteral stent.  Following stent placement the patient's right adenxa was resected by Dr. Kenton Kingfisher.  There were several pockets containing chocolate cysts that were encountered when lysing the adhesions of the right tube and ovary consistent with the known history of endometriosis.  There was also a chocolate cyst on the right lower uterine segment near the pouch of douglas. Most of this dissection was performed bluntly and using hydro dissection.    The right round ligament was transected and the previously made bladder flap was completed.  The peritoneum was opened cephalad toward to the IP ligament.  This was cauterized using bipolar and then transected using a 64m Harmonic scalpel.  The ovary and tube were then carefully dissected off the pelvis sidewall.  The right uterine artery was cauterized and transected. The bladder flap was completed and the bladder further mobilized off the V-care cup.  An anterior colpotomy was scores and carried around in a clockwise fashion to free the specimen, which was then removed vaginally.  Inspection revealed all pedicles to be hemostatic before proceed with vaginal closure.  The cuff was closed using running stitch of 0 V-loc suture using Endostitch.  The cuff was hemsotatic at the conclusion of closure without visible or palpable defects.  Both vaginal and rectal exams were done to further visualize a peritoneal window posterior to the vaginal cuff.  1g of Arista was applied to all pedicles.    The [id number] trocar site was closed using a 0 Vicryl on a Carter-Thompson device, the skin was closed using 4-0 Monocryl in a subcuticular fashion.  Pneumoperitoneum was evacuated and trocars were removed.  All trocar sites were then dressed with surgical skin glue.    The indwelling foley catheter was removed.  Cystoscopy  was performed noting and intact bladder dome as well as brisk efflux of urine from the left ureteral orifice as well as the right ureteral stent.  The cystoscopy was removed and the indwelling foley catheter was replaced.  Sponge needle and instrument counts were correct time two.  The patient tolerated the procedure well and was taken to the recovery room in stable condition.

## 2020-09-25 NOTE — Anesthesia Procedure Notes (Signed)
Procedure Name: Intubation Date/Time: 09/25/2020 1:07 PM Performed by: Kelton Pillar, CRNA Pre-anesthesia Checklist: Patient identified, Emergency Drugs available, Suction available and Patient being monitored Patient Re-evaluated:Patient Re-evaluated prior to induction Oxygen Delivery Method: Circle system utilized Preoxygenation: Pre-oxygenation with 100% oxygen Induction Type: IV induction Ventilation: Mask ventilation without difficulty Laryngoscope Size: McGraph and 3 Grade View: Grade I Tube type: Oral Tube size: 6.5 mm Number of attempts: 1 Airway Equipment and Method: Stylet and Oral airway Placement Confirmation: ETT inserted through vocal cords under direct vision, positive ETCO2, breath sounds checked- equal and bilateral and CO2 detector Secured at: 21 cm Tube secured with: Tape Dental Injury: Teeth and Oropharynx as per pre-operative assessment

## 2020-09-25 NOTE — Interval H&P Note (Signed)
History and Physical Interval Note:  09/25/2020 11:52 AM  Norma Wall  has presented today for surgery, with the diagnosis of Chronic pelvic pain female R10.2 G89.29  AUB N93.9 Endometriosis N80.9 Post ablation syndrome N99.85 Right ovarian cyst N83.201.  The various methods of treatment have been discussed with the patient and family. After consideration of risks, benefits and other options for treatment, the patient has consented to  Procedure(s): TOTAL LAPAROSCOPIC HYSTERECTOMY WITH SALPINGECTOMY (Bilateral) CYSTOSCOPY (N/A) possible right salpingectomy as a surgical intervention.  The patient's history has been reviewed, patient examined, no change in status, stable for surgery.  I have reviewed the patient's chart and labs.  Questions were answered to the patient's satisfaction.     Malachy Mood

## 2020-09-25 NOTE — Op Note (Signed)
Date of procedure: 09/25/20  Preoperative diagnosis:  Endometriosis   Postoperative diagnosis:  Same  Procedure: Cystoscopy, right retrograde pyelogram with intraoperative interpretation, right ureteral stent placement  Surgeon: Nickolas Madrid, MD  Anesthesia: General  Complications: None  Intraoperative findings:  Cystoscopy limited by uterine manipulator but no obvious abnormalities Normal retrograde with no hydronephrosis, uncomplicated right ureteral stent placement  EBL: None for urology portion  Specimens: None for urology portion  Drains: Right 6 French by 26 cm ureteral stent, 18 French Foley  Indication: Norma Wall is a 50 y.o. patient endometriosis currently undergoing a laparoscopic hysterectomy with Dr. Georgianne Fick of the gynecology service.  I was requested to place a right ureteral stent intraoperatively for improved identification of the right ureter in the setting of endometriosis and pelvic mass.  I reviewed the preoperative CT that showed possibly mild right-sided hydronephrosis, but no other stones or urologic abnormalities.  Description of procedure:  When I arrived the patient had already been intubated and draped by the GYN team.  The 16 French Foley was removed, the meatus prepped with Betadine.  A 21 French rigid cystoscope was used to intubate the urethra and cystoscopy was performed.  This was somewhat limited by the uterine manipulator device, but there were no obvious abnormalities, and the ureteral orifices were orthotopic bilaterally.  A 5 French access catheter was used to intubate the right ureteral orifice and a right retrograde pyelogram showed a normal-appearing ureter and no hydronephrosis.  A sensor wire advanced easily up to the kidney under fluoroscopic vision, and a 6 [place] by 26 cm ureteral stent was uneventfully placed with an excellent curl in the renal pelvis under fluoroscopy, as well as under direct vision in the bladder.  An  2 French Foley was replaced into the bladder and 10 mL placed in the balloon.  The case was returned to Dr. Georgianne Fick and the GYN team.   Disposition: Continue with gynecology portion of case  Plan: We will plan for stent removal in clinic in 1 to 2 weeks  Nickolas Madrid, MD

## 2020-09-25 NOTE — Transfer of Care (Signed)
Immediate Anesthesia Transfer of Care Note  Patient: Norma Wall  Procedure(s) Performed: TOTAL LAPAROSCOPIC HYSTERECTOMY WITH SALPINGECTOMY (Bilateral) CYSTOSCOPY LAPAROSCOPIC RIGHT OOPHORECTOMY (Right) CYSTOSCOPY WITH RETROGRADE PYELOGRAM/URETERAL STENT PLACEMENT (Right)  Patient Location: PACU  Anesthesia Type:General  Level of Consciousness: awake, drowsy and patient cooperative  Airway & Oxygen Therapy: Patient Spontanous Breathing and Patient connected to face mask oxygen  Post-op Assessment: Report given to RN and Post -op Vital signs reviewed and stable  Post vital signs: Reviewed and stable  Last Vitals:  Vitals Value Taken Time  BP 103/67 09/25/20 1551  Temp 37 C 09/25/20 1548  Pulse 66 09/25/20 1554  Resp 13 09/25/20 1554  SpO2 100 % 09/25/20 1554  Vitals shown include unvalidated device data.  Last Pain:  Vitals:   09/25/20 1548  TempSrc:   PainSc: 0-No pain      Patients Stated Pain Goal: 0 ([id number])  Complications: No notable events documented.

## 2020-09-25 NOTE — Progress Notes (Signed)
   Subjective: Patient reports incisional pain and tolerating PO.  Pain is well-controlled on current  analgesic regimen..  Tolerating po: Yes   Objective: Vital signs in last 24 hours: Temp:  [97.2 F (36.2 C)-98.6 F (37 C)] 97.8 F (36.6 C) (08/25 1758) Pulse Rate:  [65-84] 67 (08/25 1758) Resp:  [10-21] 16 (08/25 1758) BP: (97-122)/(61-74) 99/68 (08/25 1758) SpO2:  [93 %-100 %] 98 % (08/25 1758) Weight:  [83.5 kg] 83.5 kg (08/25 1129)    Intake/Output from previous day: No intake/output data recorded.  Physical Examination: Physical Exam Constitutional:      Appearance: Normal appearance.  HENT:     Head: Normocephalic and atraumatic.  Abdominal:     General: There is no distension.     Palpations: Abdomen is soft.     Tenderness: There is abdominal tenderness.  Neurological:     Mental Status: She is alert.  Incisions D/C/I  Labs: Results for orders placed or performed during the hospital encounter of 09/25/20 (from the past 72 hour(s))  Pregnancy, urine POC     Status: None   Collection Time: 09/25/20 11:18 AM  Result Value Ref Range   Preg Test, Ur NEGATIVE NEGATIVE    Comment:        THE SENSITIVITY OF THIS METHODOLOGY IS >24 mIU/mL   ABO/Rh     Status: None   Collection Time: 09/25/20 11:26 AM  Result Value Ref Range   ABO/RH(D)      O POS Performed at [facility], [address], [place], [place] [address]      Assessment:  50 y.o. s/p Day of Surgery Procedure(s) (LRB): TOTAL LAPAROSCOPIC HYSTERECTOMY WITH SALPINGECTOMY (Bilateral) CYSTOSCOPY (N/A) CYSTOSCOPY WITH RETROGRADE PYELOGRAM/URETERAL STENT PLACEMENT (Right) LAPAROSCOPIC RIGHT OOPHORECTOMY (Right) : stable  Plan: 1) Pain: doing well with Toradol so far  2) FEN: advance diet as needed  4) Prophylaxis: intermittent pneumatic compression boots.  5) Disposition: The patient is to be discharged to anticipate discharge tomorrow, 1 week postop with myself and urology for  stent removal.  Malachy Mood, MD, [place], [place] 09/25/2020, 6:39 PM

## 2020-09-25 NOTE — Anesthesia Preprocedure Evaluation (Signed)
Anesthesia Evaluation  Patient identified by MRN, date of birth, ID band Patient awake    Reviewed: Allergy & Precautions, NPO status , Patient's Chart, lab work & pertinent test results  History of Anesthesia Complications (+) PONV and history of anesthetic complications  Airway Mallampati: I  TM Distance: >3 FB Neck ROM: Full    Dental no notable dental hx. (+) Teeth Intact   Pulmonary neg pulmonary ROS, neg sleep apnea, neg COPD, Patient abstained from smoking.Not current smoker, former smoker,    Pulmonary exam normal breath sounds clear to auscultation       Cardiovascular Exercise Tolerance: Good METS(-) hypertension(-) CAD and (-) Past MI negative cardio ROS  (-) dysrhythmias  Rhythm:Regular Rate:Normal - Systolic murmurs    Neuro/Psych  Headaches, PSYCHIATRIC DISORDERS Anxiety    GI/Hepatic neg GERD  ,(+)     (-) substance abuse  ,   Endo/Other  neg diabetes  Renal/GU negative Renal ROS     Musculoskeletal   Abdominal   Peds  Hematology   Anesthesia Other Findings Past Medical History: 2013: Abdominal pain, right upper quadrant No date: Anxiety No date: [facility])     Comment:  basal cells- [place] 2021: COVID-19 01/2013: Endometriosis No date: Gallbladder polyp 2020: Lipidemia 01/2019: Lymphedema No date: Migraine headache     Comment:  3-4x/yr 2014: Ovarian cyst     Comment:  right ovary No date: PONV (postoperative nausea and vomiting) No date: Vertigo     Comment:  resolves quickly  Reproductive/Obstetrics                             Anesthesia Physical Anesthesia Plan  ASA: 2  Anesthesia Plan: General   Post-op Pain Management:    Induction: Intravenous  PONV Risk Score and Plan: 4 or greater and Ondansetron, Dexamethasone, Midazolam, TIVA, Propofol infusion, Aprepitant and Treatment may vary due to age or medical condition  Airway Management Planned:  Oral ETT  Additional Equipment: None  Intra-op Plan:   Post-operative Plan: Extubation in OR  Informed Consent: I have reviewed the patients History and Physical, chart, labs and discussed the procedure including the risks, benefits and alternatives for the proposed anesthesia with the patient or authorized representative who has indicated his/her understanding and acceptance.     Dental advisory given  Plan Discussed with: CRNA and Surgeon  Anesthesia Plan Comments: (Discussed risks of anesthesia with patient, including PONV, sore throat, lip/dental damage. Rare risks discussed as well, such as cardiorespiratory and neurological sequelae, and allergic reactions. Patient understands.)        Anesthesia Quick Evaluation

## 2020-09-26 ENCOUNTER — Encounter: Payer: Self-pay | Admitting: Obstetrics and Gynecology

## 2020-09-26 DIAGNOSIS — N857 Hematometra: Secondary | ICD-10-CM | POA: Diagnosis not present

## 2020-09-26 DIAGNOSIS — Z87891 Personal history of nicotine dependence: Secondary | ICD-10-CM | POA: Diagnosis not present

## 2020-09-26 DIAGNOSIS — N838 Other noninflammatory disorders of ovary, fallopian tube and broad ligament: Secondary | ICD-10-CM | POA: Diagnosis not present

## 2020-09-26 DIAGNOSIS — N939 Abnormal uterine and vaginal bleeding, unspecified: Secondary | ICD-10-CM | POA: Diagnosis not present

## 2020-09-26 DIAGNOSIS — N8502 Endometrial intraepithelial neoplasia [EIN]: Secondary | ICD-10-CM | POA: Diagnosis not present

## 2020-09-26 LAB — CBC
HCT: 36.1 % (ref 36.0–46.0)
Hemoglobin: 12 g/dL (ref 12.0–15.0)
MCH: 30.5 pg (ref 26.0–34.0)
MCHC: 33.2 g/dL (ref 30.0–36.0)
MCV: 91.9 fL (ref 80.0–100.0)
Platelets: 200 10*3/uL (ref 150–400)
RBC: 3.93 MIL/uL (ref 3.87–5.11)
RDW: 12.5 % (ref 11.5–15.5)
WBC: 10.5 10*3/uL (ref 4.0–10.5)
nRBC: 0 % (ref 0.0–0.2)

## 2020-09-26 LAB — BASIC METABOLIC PANEL
Anion gap: 6 (ref 5–15)
BUN: 11 mg/dL (ref 6–20)
CO2: 25 mmol/L (ref 22–32)
Calcium: 8.2 mg/dL — ABNORMAL LOW (ref 8.9–10.3)
Chloride: 104 mmol/L (ref 98–111)
Creatinine, Ser: 0.82 mg/dL (ref 0.44–1.00)
GFR, Estimated: >60 mL/min (ref >60)
Glucose, Bld: 127 mg/dL — ABNORMAL HIGH (ref 70–99)
Potassium: 4.1 mmol/L (ref 3.5–5.1)
Sodium: 135 mmol/L (ref 135–145)

## 2020-09-26 MED ORDER — OXYCODONE-ACETAMINOPHEN 5-325 MG PO TABS: 1.0000 | ORAL_TABLET | ORAL | 0 refills | Status: DC | PRN

## 2020-09-26 MED ORDER — PROPOFOL 1000 MG/100ML IV EMUL
INTRAVENOUS | Status: AC
  Filled 2020-09-26: qty 100

## 2020-09-26 MED ORDER — IBUPROFEN 600 MG PO TABS: 600.0000 mg | ORAL_TABLET | Freq: Four times a day (QID) | ORAL | 0 refills | Status: DC

## 2020-09-26 MED ORDER — ACETAMINOPHEN 10 MG/ML IV SOLN
INTRAVENOUS | Status: AC
  Filled 2020-09-26: qty 100

## 2020-09-26 MED ORDER — FENTANYL CITRATE (PF) 100 MCG/2ML IJ SOLN
INTRAMUSCULAR | Status: AC
  Filled 2020-09-26: qty 2

## 2020-09-26 NOTE — Discharge Summary (Signed)
Physician Discharge Summary  Patient ID: Norma Wall MRN: [id number] DOB/AGE: 50-May-1972 50 y.o.  Admit date: 09/25/2020 Discharge date: 09/26/2020  Admission Diagnoses: PATSS  Discharge Diagnoses:  Active Problems:   Dysmenorrhea   Pelvic pain   Endometrioma   S/P laparoscopic hysterectomy   Discharged Condition: stable  Hospital Course: 50 y.o. G2P2 presenting for scheduled TLH, BS, cystoscopy for PATSS.  Patient also with significant progression of previously diagnosed endometriosis with stage IV endometriosis of primarily involving the right pelvic sidewall.  Patient also underwent right oophorectomy and right ureteral stent during procedure to help with localization of the ureter.  She did well postoperatively, remained hemodynamically stable and afebrile.  POD1 patient was ambulating, reporting good pain control, voiding, and had stable BUN/Cr and H&H.  She will follow up in 1 week for incision check with myself and 1 week for stent removal with urology.  Consults: urology  Significant Diagnostic Studies:  Results for orders placed or performed during the hospital encounter of 09/25/20 (from the past 24 hour(s))  Pregnancy, urine POC     Status: None   Collection Time: 09/25/20 11:18 AM  Result Value Ref Range   Preg Test, Ur NEGATIVE NEGATIVE  ABO/Rh     Status: None   Collection Time: 09/25/20 11:26 AM  Result Value Ref Range   ABO/RH(D)      O POS Performed at [facility], [address] [place], [place] [address]   CBC     Status: None   Collection Time: 09/26/20  6:26 AM  Result Value Ref Range   WBC 10.5 4.0 - 10.5 K/uL   RBC 3.93 3.87 - 5.11 MIL/uL   Hemoglobin 12.0 12.0 - 15.0 g/dL   HCT 36.1 36.0 - 46.0 %   MCV 91.9 80.0 - 100.0 fL   MCH 30.5 26.0 - 34.0 pg   MCHC 33.2 30.0 - 36.0 g/dL   RDW 12.5 11.5 - 15.5 %   Platelets 200 150 - 400 K/uL   nRBC 0.0 0.0 - 0.2 %  Basic metabolic panel     Status: Abnormal   Collection Time:  09/26/20  6:26 AM  Result Value Ref Range   Sodium 135 135 - 145 mmol/L   Potassium 4.1 3.5 - 5.1 mmol/L   Chloride 104 98 - 111 mmol/L   CO2 25 22 - 32 mmol/L   Glucose, Bld 127 (H) 70 - 99 mg/dL   BUN 11 6 - 20 mg/dL   Creatinine, Ser 0.82 0.44 - 1.00 mg/dL   Calcium 8.2 (L) 8.9 - 10.3 mg/dL   GFR, Estimated >60 >60 mL/min   Anion gap 6 5 - 15     Treatments: surgery: TLH, BS, RO, cystoscopy  Discharge Exam: Blood pressure 98/65, pulse 84, temperature 98.6 F (37 C), temperature source Oral, resp. rate 16, height '5\' 7"'$  (1.702 m), weight 83.5 kg, last menstrual period 09/08/2020, SpO2 95 %. General: NAD Pulmonary: no increased work of breathing Abdomen: soft, non-tender, non-distended, incision(s) D/C/I Extremities: no edema Neurologic: normal gait   Disposition: Discharge disposition: 01-Home or Self Care       Discharge Instructions     Call MD for:   Complete by: As directed    Heavy vaginal bleeding greater than 1 pad an hour   Call MD for:  difficulty breathing, headache or visual disturbances   Complete by: As directed    Call MD for:  extreme fatigue   Complete by: As directed  Call MD for:  hives   Complete by: As directed    Call MD for:  persistant dizziness or light-headedness   Complete by: As directed    Call MD for:  persistant nausea and vomiting   Complete by: As directed    Call MD for:  redness, tenderness, or signs of infection (pain, swelling, redness, odor or green/yellow discharge around incision site)   Complete by: As directed    Call MD for:  severe uncontrolled pain   Complete by: As directed    Call MD for:  temperature >100.4   Complete by: As directed    Diet general   Complete by: As directed    Discharge wound care:   Complete by: As directed    You may apply a light dressing for minor discharge from the incision or to keep waistbands of clothing from rubbing.  You may shower, use soap on your incision.  Avoid baths or  soaking the incision in the first 6 weeks following your surgery..   Driving restriction   Complete by: As directed    Avoid driving for at least 2 weeks or while taking prescription narcotics.   Lifting restrictions   Complete by: As directed    Weight restriction of 10lbs for 6 weeks.      Allergies as of 09/26/2020   No Known Allergies      Medication List     TAKE these medications    acetaminophen 500 MG tablet Commonly known as: TYLENOL Take 1,000 mg by mouth every 6 (six) hours as needed for moderate pain, fever or headache.   clonazePAM 0.5 MG tablet Commonly known as: KLONOPIN TAKE 1 TABLET BY MOUTH TWICE DAILY AS NEEDED FOR ANXIETY OR PALPITATIONS   ibuprofen 200 MG tablet Commonly known as: ADVIL Take 400-800 mg by mouth every 6 (six) hours as needed for mild pain or moderate pain. What changed: Another medication with the same name was added. Make sure you understand how and when to take each.   ibuprofen 600 MG tablet Commonly known as: ADVIL Take 1 tablet (600 mg total) by mouth every 6 (six) hours. What changed: You were already taking a medication with the same name, and this prescription was added. Make sure you understand how and when to take each.   oxyCODONE-acetaminophen 5-325 MG tablet Commonly known as: PERCOCET/ROXICET Take 1 tablet by mouth every 4 (four) hours as needed for severe pain or moderate pain ((when tolerating fluids)). What changed:  when to take this reasons to take this   phenylephrine 10 MG Tabs tablet Commonly known as: SUDAFED PE Take 10 mg by mouth every 4 (four) hours as needed (nasal congestion).   polyethylene glycol-electrolytes 420 g solution Commonly known as: GaviLyte-N with Flavor Pack Drink one 8 oz glass every 20 mins until entire container is finished starting at 5:00pm on 07/20/20   SUMAtriptan 50 MG tablet Commonly known as: IMITREX Take 1 tablet (50 mg total) by mouth every 2 (two) hours as needed for  migraine. May repeat in 2 hours if headache persists or recurs.               Discharge Care Instructions  (From admission, onward)           Start     Ordered   09/26/20 0000  Discharge wound care:       Comments: You may apply a light dressing for minor discharge from the incision or to keep waistbands of  clothing from rubbing.  You may shower, use soap on your incision.  Avoid baths or soaking the incision in the first 6 weeks following your surgery.[name]   09/26/20 0809            Follow-up Information     Billey Co, MD Follow up in 1 week(s).   Specialty: Urology Why: Stent removal Contact information: [place] [address] [phone]         Malachy Mood, MD Follow up in 1 week(s).   Specialty: Obstetrics and Gynecology Why: For wound re-check Contact information: [address] [place] [address] [phone]                 Signed: Malachy Mood 09/26/2020, 8:10 AM

## 2020-09-26 NOTE — Progress Notes (Signed)
Patient discharged, all discharge instructions give, questions answered.  Patient is aware of upcoming appointments.  Transported by auxillary

## 2020-09-27 NOTE — Anesthesia Postprocedure Evaluation (Signed)
Anesthesia Post Note  Patient: Norma Wall  Procedure(s) Performed: TOTAL LAPAROSCOPIC HYSTERECTOMY WITH SALPINGECTOMY (Bilateral) CYSTOSCOPY LAPAROSCOPIC RIGHT OOPHORECTOMY (Right) CYSTOSCOPY WITH RETROGRADE PYELOGRAM/URETERAL STENT PLACEMENT (Right)  Patient location during evaluation: PACU Anesthesia Type: General Level of consciousness: awake and alert Pain management: pain level controlled Vital Signs Assessment: post-procedure vital signs reviewed and stable Respiratory status: spontaneous breathing, nonlabored ventilation, respiratory function stable and patient connected to nasal cannula oxygen Cardiovascular status: blood pressure returned to baseline and stable Postop Assessment: no apparent nausea or vomiting Anesthetic complications: no   No notable events documented.   Last Vitals:  Vitals:   09/25/20 2341 09/26/20 0812  BP: 98/65 94/67  Pulse: 84 66  Resp: 16 18  Temp: 37 C 36.9 C  SpO2: 95% 100%    Last Pain:  Vitals:   09/26/20 1055  TempSrc:   PainSc: 4                  Arita Miss

## 2020-09-29 LAB — SURGICAL PATHOLOGY

## 2020-10-02 ENCOUNTER — Ambulatory Visit (INDEPENDENT_AMBULATORY_CARE_PROVIDER_SITE_OTHER): Payer: Federal, State, Local not specified - PPO | Admitting: Urology

## 2020-10-02 ENCOUNTER — Encounter: Payer: Self-pay | Admitting: Urology

## 2020-10-02 ENCOUNTER — Other Ambulatory Visit: Payer: Federal, State, Local not specified - PPO

## 2020-10-02 ENCOUNTER — Other Ambulatory Visit: Payer: Self-pay

## 2020-10-02 VITALS — BP 97/68 | HR 84 | Ht 67.0 in | Wt 188.0 lb

## 2020-10-02 DIAGNOSIS — Z466 Encounter for fitting and adjustment of urinary device: Secondary | ICD-10-CM

## 2020-10-02 MED ORDER — SULFAMETHOXAZOLE-TRIMETHOPRIM 800-160 MG PO TABS
1.0000 | ORAL_TABLET | Freq: Once | ORAL | Status: AC
  Administered 2020-10-02: 1 via ORAL

## 2020-10-02 MED ORDER — LIDOCAINE HCL URETHRAL/MUCOSAL 2 % EX GEL
1.0000 "application " | Freq: Once | CUTANEOUS | Status: AC
  Administered 2020-10-02: 1 via URETHRAL

## 2020-10-02 NOTE — Progress Notes (Signed)
Cystoscopy Procedure Note:  Indication: Stent removal s/p laparoscopic hysterectomy and bilateral salpingectomy, right oophorectomy with GYN team on 09/25/2020 for endometriosis, urology consult was requested intraoperatively for right ureteral stent placement to aid in identification.  Retrograde pyelogram was normal and stent was placed.  Bactrim given for prophylaxis  After informed consent and discussion of the procedure and its risks, Norma Wall was positioned and prepped in the standard fashion. Cystoscopy was performed with a flexible cystoscope. The stent was grasped with flexible graspers and removed in its entirety. The patient tolerated the procedure well.  Findings: Uncomplicated stent removal  Assessment and Plan: Follow-up with urology as needed  Billey Co, MD 10/02/2020

## 2020-10-03 ENCOUNTER — Encounter: Payer: Self-pay | Admitting: Obstetrics and Gynecology

## 2020-10-03 ENCOUNTER — Encounter: Payer: Federal, State, Local not specified - PPO | Admitting: Obstetrics and Gynecology

## 2020-10-03 ENCOUNTER — Ambulatory Visit (INDEPENDENT_AMBULATORY_CARE_PROVIDER_SITE_OTHER): Payer: Federal, State, Local not specified - PPO | Admitting: Obstetrics and Gynecology

## 2020-10-03 VITALS — BP 97/58 | Ht 67.0 in | Wt 186.0 lb

## 2020-10-03 DIAGNOSIS — Z4889 Encounter for other specified surgical aftercare: Secondary | ICD-10-CM

## 2020-10-03 LAB — URINALYSIS, COMPLETE
Bilirubin, UA: NEGATIVE
Glucose, UA: NEGATIVE
Nitrite, UA: NEGATIVE
Specific Gravity, UA: 1.015 (ref 1.005–1.030)
Urobilinogen, Ur: 0.2 mg/dL (ref 0.2–1.0)
pH, UA: 6.5 (ref 5.0–7.5)

## 2020-10-03 LAB — MICROSCOPIC EXAMINATION
Bacteria, UA: NONE SEEN
RBC, Urine: >30 /hpf — AB (ref 0–2)

## 2020-10-03 NOTE — Progress Notes (Signed)
      Postoperative Follow-up Patient presents post op from [place], LS, RSO, cystoscopy, right ureteral stent placement 1weeks ago for  endometriosis, hematometra following history o fprior ablation .  Subjective: Patient reports some improvement in her preop symptoms. Eating a regular diet without difficulty. Pain is controlled with current analgesics. Medications being used: prescription NSAID's including ibuprofen (Motrin) and narcotic analgesics including Percocet.  Activity: normal activities of daily living.  Objective: Blood pressure (!) 97/58, height '5\' 7"'$  (1.702 m), weight 186 lb (84.4 kg), last menstrual period 09/08/2020.  General: NAD Pulmonary: no increased work of breathing Abdomen: soft, non-tender, non-distended, incision(s) D/C/I Extremities: no edema Neurologic: normal gait   Procedure visit on 10/02/2020  Component Date Value Ref Range Status   Specific Gravity, UA 10/02/2020 1.015  1.005 - 1.030 Final   pH, UA 10/02/2020 6.5  5.0 - 7.5 Final   Color, UA 10/02/2020 Orange  Yellow Final   Appearance Ur 10/02/2020 Cloudy (A) Clear Final   Leukocytes,UA 10/02/2020 1+ (A) Negative Final   Protein,UA 10/02/2020 2+ (A) Negative/Trace Final   Glucose, UA 10/02/2020 Negative  Negative Final   Ketones, UA 10/02/2020 Trace (A) Negative Final   RBC, UA 10/02/2020 3+ (A) Negative Final   Bilirubin, UA 10/02/2020 Negative  Negative Final   Urobilinogen, Ur 10/02/2020 0.2  0.2 - 1.0 mg/dL Final   Nitrite, UA 10/02/2020 Negative  Negative Final   Microscopic Examination 10/02/2020 See below:   Final   WBC, UA 10/02/2020 6-10 (A) 0 - 5 /hpf Final   RBC 10/02/2020 >30 (A) 0 - 2 /hpf Final   Epithelial Cells (non renal) 10/02/2020 0-10  0 - 10 /hpf Final   Bacteria, UA 10/02/2020 None seen  None seen/Few Final    Assessment: 50 y.o. s/p TLH, LS, RSO, cystoscopy and stent placement stable  Plan: Patient has done well after surgery with no apparent complications.  I have  discussed the post-operative course to date, and the expected progress moving forward.  The patient understands what complications to be concerned about.  I will see the patient in routine follow up, or sooner if needed.    Activity plan: No heavy lifting.  Stent was removed yesterday by urology.     Malachy Mood, MD, [facility], [facility] 10/03/2020, 3:26 PM

## 2020-10-17 ENCOUNTER — Ambulatory Visit: Payer: Federal, State, Local not specified - PPO | Admitting: Obstetrics and Gynecology

## 2020-10-20 ENCOUNTER — Ambulatory Visit (INDEPENDENT_AMBULATORY_CARE_PROVIDER_SITE_OTHER): Payer: Federal, State, Local not specified - PPO | Admitting: Vascular Surgery

## 2020-10-20 ENCOUNTER — Other Ambulatory Visit: Payer: Self-pay

## 2020-10-20 VITALS — BP 114/76 | HR 67 | Ht 67.0 in | Wt 193.0 lb

## 2020-10-20 DIAGNOSIS — I890 Lymphedema, not elsewhere classified: Secondary | ICD-10-CM

## 2020-10-22 ENCOUNTER — Encounter (INDEPENDENT_AMBULATORY_CARE_PROVIDER_SITE_OTHER): Payer: Self-pay | Admitting: Vascular Surgery

## 2020-10-22 NOTE — Progress Notes (Signed)
MRN : [id number]  Norma Wall is a 50 y.o. (1970/12/26) female who presents with chief complaint of check legs.  History of Present Illness:   The patient returns to the office for followup evaluation regarding leg swelling.  The swelling has improved quite a bit and the pain associated with swelling has decreased substantially. There have not been any interval development of a ulcerations or wounds.  She notes that she has been exercising on a much more consistent basis and has been losing weight which has improved her lower extremities significantly and made her overall feel quite a bit better 2.  Since the previous visit the patient has been wearing graduated compression stockings and has noted improvement in the lymphedema. The patient has been using compression.  The patient also states elevation during the day and exercise is being done too.     Current Meds  Medication Sig   acetaminophen (TYLENOL) 500 MG tablet Take 1,000 mg by mouth every 6 (six) hours as needed for moderate pain, fever or headache.   clonazePAM (KLONOPIN) 0.5 MG tablet TAKE 1 TABLET BY MOUTH TWICE DAILY AS NEEDED FOR ANXIETY OR PALPITATIONS   ibuprofen (ADVIL) 200 MG tablet Take 400-800 mg by mouth every 6 (six) hours as needed for mild pain or moderate pain.   ibuprofen (ADVIL) 600 MG tablet Take 1 tablet (600 mg total) by mouth every 6 (six) hours.   oxyCODONE-acetaminophen (PERCOCET/ROXICET) 5-325 MG tablet Take 1 tablet by mouth every 4 (four) hours as needed for severe pain or moderate pain ((when tolerating fluids)).   phenylephrine (SUDAFED PE) 10 MG TABS tablet Take 10 mg by mouth every 4 (four) hours as needed (nasal congestion).   polyethylene glycol-electrolytes (GAVILYTE-N WITH FLAVOR PACK) 420 g solution Drink one 8 oz glass every 20 mins until entire container is finished starting at 5:00pm on 07/20/20   SUMAtriptan (IMITREX) 50 MG tablet Take 1 tablet (50 mg total) by mouth every 2  (two) hours as needed for migraine. May repeat in 2 hours if headache persists or recurs.    Past Medical History:  Diagnosis Date   Abdominal pain, right upper quadrant 2013   Anxiety    Cancer ([place])    basal cells- [place]   COVID-19 2021   Endometriosis 01/2013   Gallbladder polyp    Lipidemia 2020   Lymphedema 01/2019   Migraine headache    3-4x/yr   Ovarian cyst 2014   right ovary   PONV (postoperative nausea and vomiting)    Vertigo    resolves quickly    Past Surgical History:  Procedure Laterality Date   ABLATION  2012   cyst removal   CHOLECYSTECTOMY N/A 10/19/2018   Procedure: LAPAROSCOPIC CHOLECYSTECTOMY;  Surgeon: Jules Husbands, MD;  Location: [facility];  Service: General;  Laterality: N/A;   COLONOSCOPY WITH PROPOFOL N/A 07/21/2020   Procedure: COLONOSCOPY WITH PROPOFOL;  Surgeon: [name], MD;  Location: [facility];  Service: Endoscopy;  Laterality: N/A;   CYSTOSCOPY N/A 09/25/2020   Procedure: CYSTOSCOPY;  Surgeon: Malachy Mood, MD;  Location: [facility];  Service: Gynecology;  Laterality: N/A;   CYSTOSCOPY W/ URETERAL STENT PLACEMENT Right 09/25/2020   Procedure: CYSTOSCOPY WITH RETROGRADE PYELOGRAM/URETERAL STENT PLACEMENT;  Surgeon: Billey Co, MD;  Location: [facility];  Service: Urology;  Laterality: Right;   LAPAROSCOPIC OVARIAN CYSTECTOMY Right 10/19/2018   Procedure: LAPAROSCOPIC RIGHT OVARIAN CYSTECTOMY;  Surgeon: Malachy Mood, MD;  Location: [facility];  Service: Gynecology;  Laterality: Right;   NECK SURGERY  02/22/2018   Artificial Disc replacement    OVARIAN CYST REMOVAL     POLYPECTOMY N/A 07/21/2020   Procedure: POLYPECTOMY;  Surgeon: [name], MD;  Location: [facility];  Service: Endoscopy;  Laterality: N/A;   TOTAL LAPAROSCOPIC HYSTERECTOMY WITH SALPINGECTOMY Bilateral 09/25/2020   Procedure: TOTAL LAPAROSCOPIC HYSTERECTOMY WITH SALPINGECTOMY;  Surgeon: Malachy Mood, MD;  Location: [facility];  Service:  Gynecology;  Laterality: Bilateral;    Social History Social History   Tobacco Use   Smoking status: Former    Types: Cigarettes    Quit date: 02/01/1997    Years since quitting: 23.7   Smokeless tobacco: Never  Vaping Use   Vaping Use: Never used  Substance Use Topics   Alcohol use: Yes    Comment: occassional    Drug use: No    Family History Family History  Problem Relation Age of Onset   Lung cancer Father    Diabetes Mother    Stroke Mother    Diabetes Maternal Grandmother    Colon cancer Neg Hx    Breast cancer Neg Hx     No Known Allergies   REVIEW OF SYSTEMS (Negative unless checked)  Constitutional: [] Weight loss  [] Fever  [] Chills Cardiac: [] Chest pain   [] Chest pressure   [] Palpitations   [] Shortness of breath when laying flat   [] Shortness of breath with exertion. Vascular:  [] Pain in legs with walking   [] Pain in legs at rest  [] History of DVT   [] Phlebitis   [x] Swelling in legs   [] Varicose veins   [] Non-healing ulcers Pulmonary:   [] Uses home oxygen   [] Productive cough   [] Hemoptysis   [] Wheeze  [] COPD   [] Asthma Neurologic:  [] Dizziness   [] Seizures   [] History of stroke   [] History of TIA  [] Aphasia   [] Vissual changes   [] Weakness or numbness in arm   [] Weakness or numbness in leg Musculoskeletal:   [] Joint swelling   [] Joint pain   [] Low back pain Hematologic:  [] Easy bruising  [] Easy bleeding   [] Hypercoagulable state   [] Anemic Gastrointestinal:  [] Diarrhea   [] Vomiting  [] Gastroesophageal reflux/heartburn   [] Difficulty swallowing. Genitourinary:  [] Chronic kidney disease   [] Difficult urination  [] Frequent urination   [] Blood in urine Skin:  [] Rashes   [] Ulcers  Psychological:  [] History of anxiety   []  History of major depression.  Physical Examination  Vitals:   10/20/20 1459  BP: 114/76  Pulse: 67  Weight: 193 lb (87.5 kg)  Height: 5\' 7"  (1.702 m)   Body mass index is 30.23 kg/m. Gen: WD/WN, NAD Head: [place]/AT, No temporalis wasting.   Ear/Nose/Throat: Hearing grossly intact, nares w/o erythema or drainage, pinna without lesions Eyes: PER, EOMI, sclera nonicteric.  Neck: Supple, no gross masses.  No JVD.  Pulmonary:  Good air movement, no audible wheezing, no use of accessory muscles.  Cardiac: RRR, precordium not hyperdynamic. Vascular:  scattered varicosities present bilaterally.  Mild venous stasis changes to the legs bilaterally.  1-2+ soft pitting edema  Vessel Right Left  Radial Palpable Palpable  Gastrointestinal: soft, non-distended. No guarding/no peritoneal signs.  Musculoskeletal: M/S 5/5 throughout.  No deformity.  Neurologic: CN 2-12 intact. Pain and light touch intact in extremities.  Symmetrical.  Speech is fluent. Motor exam as listed above. Psychiatric: Judgment intact, Mood & affect appropriate for pt's clinical situation. Dermatologic: Venous rashes no ulcers noted.  No changes consistent with cellulitis. Lymph : No lichenification or skin changes of  chronic lymphedema.  CBC Lab Results  Component Value Date   WBC 10.5 09/26/2020   HGB 12.0 09/26/2020   HCT 36.1 09/26/2020   MCV 91.9 09/26/2020   PLT 200 09/26/2020    BMET    Component Value Date/Time   NA 135 09/26/2020 0626   NA 137 01/11/2020 0855   NA 139 01/09/2013 1613   K 4.1 09/26/2020 0626   K 3.8 01/09/2013 1613   CL 104 09/26/2020 0626   CL 108 (H) 01/09/2013 1613   CO2 25 09/26/2020 0626   CO2 27 01/09/2013 1613   GLUCOSE 127 (H) 09/26/2020 0626   GLUCOSE 88 01/09/2013 1613   BUN 11 09/26/2020 0626   BUN 14 01/11/2020 0855   BUN 15 01/09/2013 1613   CREATININE 0.82 09/26/2020 0626   CREATININE 0.64 01/09/2013 1613   CALCIUM 8.2 (L) 09/26/2020 0626   CALCIUM 8.7 01/09/2013 1613   GFRNONAA >60 09/26/2020 0626   GFRNONAA >60 01/09/2013 1613   GFRAA 89 01/11/2020 0855   GFRAA >60 01/09/2013 1613   CrCl cannot be calculated (Patient's most recent lab result is older than the maximum 21 days allowed.).  COAG No  results found for: INR, PROTIME  Radiology DG C-Arm 1-60 Min-No Report  Result Date: 09/25/2020 Fluoroscopy was utilized by the requesting physician.  No radiographic interpretation.     Assessment/Plan 1. Lymphedema No surgery or intervention at this point in time.  I have reviewed my discussion with the patient regarding venous insufficiency and why it causes symptoms. I have discussed with the patient the chronic skin changes that accompany venous insufficiency and the long term sequela such as ulceration. Patient will contnue wearing graduated compression stockings on a daily basis, as this has provided excellent control of his edema. The patient will put the stockings on first thing in the morning and removing them in the evening. The patient is reminded not to sleep in the stockings.  In addition, behavioral modification including elevation during the day will be initiated. Exercise is strongly encouraged.  Given the patient's good control and lack of any problems regarding the venous insufficiency she will follow up with me PRN should anything change.  The patient voices agreement with this plan.     Hortencia Pilar, MD  10/22/2020 12:32 PM

## 2020-10-22 NOTE — Telephone Encounter (Signed)
This encounter was created in error - please disregard.

## 2020-10-30 ENCOUNTER — Ambulatory Visit
Admission: RE | Admit: 2020-10-30 | Discharge: 2020-10-30 | Disposition: A | Discharge status: Home / Self Care | Payer: Federal, State, Local not specified - PPO | Source: Ambulatory Visit | Attending: Obstetrics and Gynecology | Admitting: Obstetrics and Gynecology

## 2020-10-30 ENCOUNTER — Other Ambulatory Visit: Payer: Self-pay

## 2020-10-30 DIAGNOSIS — Z1231 Encounter for screening mammogram for malignant neoplasm of breast: Secondary | ICD-10-CM | POA: Insufficient documentation

## 2020-10-30 DIAGNOSIS — Z1239 Encounter for other screening for malignant neoplasm of breast: Secondary | ICD-10-CM

## 2020-11-05 ENCOUNTER — Encounter: Payer: Self-pay | Admitting: Family Medicine

## 2020-11-07 ENCOUNTER — Ambulatory Visit (INDEPENDENT_AMBULATORY_CARE_PROVIDER_SITE_OTHER): Payer: Federal, State, Local not specified - PPO | Admitting: Obstetrics and Gynecology

## 2020-11-07 ENCOUNTER — Other Ambulatory Visit: Payer: Self-pay

## 2020-11-07 ENCOUNTER — Encounter: Payer: Self-pay | Admitting: Obstetrics and Gynecology

## 2020-11-07 VITALS — BP 100/60 | Ht 67.0 in | Wt 197.0 lb

## 2020-11-07 DIAGNOSIS — Z4889 Encounter for other specified surgical aftercare: Secondary | ICD-10-CM

## 2020-11-07 NOTE — Progress Notes (Signed)
      Postoperative Follow-up Patient presents post op from [place], BS, RO, vasectomy 1weeks ago for pelvic pain.  Subjective: Patient reports marked improvement in her preop symptoms. Eating a regular diet without difficulty. The patient is not having any pain.  Activity: normal activities of daily living.  Objective: Blood pressure 100/60, height 5\' 7"  (1.702 m), weight 197 lb (89.4 kg), last menstrual period 09/08/2020.  General: NAD Pulmonary: no increased work of breathing Abdomen: soft, non-tender, non-distended, incision(s) D/C/I GU: normal external female genitalia vaginal cuff intact, well healed Extremities: no edema Neurologic: normal gait   Procedure visit on 10/02/2020  Component Date Value Ref Range Status   Specific Gravity, UA 10/02/2020 1.015  1.005 - 1.030 Final   pH, UA 10/02/2020 6.5  5.0 - 7.5 Final   Color, UA 10/02/2020 Orange  Yellow Final   Appearance Ur 10/02/2020 Cloudy (A) Clear Final   Leukocytes,UA 10/02/2020 1+ (A) Negative Final   Protein,UA 10/02/2020 2+ (A) Negative/Trace Final   Glucose, UA 10/02/2020 Negative  Negative Final   Ketones, UA 10/02/2020 Trace (A) Negative Final   RBC, UA 10/02/2020 3+ (A) Negative Final   Bilirubin, UA 10/02/2020 Negative  Negative Final   Urobilinogen, Ur 10/02/2020 0.2  0.2 - 1.0 mg/dL Final   Nitrite, UA 10/02/2020 Negative  Negative Final   Microscopic Examination 10/02/2020 See below:   Final   WBC, UA 10/02/2020 6-10 (A) 0 - 5 /hpf Final   RBC 10/02/2020 >30 (A) 0 - 2 /hpf Final   Epithelial Cells (non renal) 10/02/2020 0-10  0 - 10 /hpf Final   Bacteria, UA 10/02/2020 None seen  None seen/Few Final    Assessment: 50 y.o. s/p TLH, bilateral salpingectomy, right oophorectomy, and cystoscopy stable  Plan: Patient has done well after surgery with no apparent complications.  I have discussed the post-operative course to date, and the expected progress moving forward.  The patient understands what  complications to be concerned about.  I will see the patient in routine follow up, or sooner if needed.    Activity plan: No restriction.   Malachy Mood, MD, [facility], [facility] 11/07/2020, 9:07 AM

## 2020-11-10 NOTE — Telephone Encounter (Signed)
I'd recommend that she gets established with her new PCP Daneil Dan or [place] [facility]) and let them decide on this at that appt.

## 2020-11-17 ENCOUNTER — Ambulatory Visit (INDEPENDENT_AMBULATORY_CARE_PROVIDER_SITE_OTHER): Payer: Federal, State, Local not specified - PPO | Admitting: Vascular Surgery

## 2020-12-31 DIAGNOSIS — Z85828 Personal history of other malignant neoplasm of skin: Secondary | ICD-10-CM | POA: Diagnosis not present

## 2020-12-31 DIAGNOSIS — L578 Other skin changes due to chronic exposure to nonionizing radiation: Secondary | ICD-10-CM | POA: Diagnosis not present

## 2020-12-31 DIAGNOSIS — Z86018 Personal history of other benign neoplasm: Secondary | ICD-10-CM | POA: Diagnosis not present

## 2021-01-15 ENCOUNTER — Ambulatory Visit
Admission: EM | Admit: 2021-01-15 | Discharge: 2021-01-15 | Disposition: A | Discharge status: Home / Self Care | Payer: Federal, State, Local not specified - PPO | Attending: Physician Assistant | Admitting: Physician Assistant

## 2021-01-15 ENCOUNTER — Encounter: Payer: Self-pay | Admitting: Emergency Medicine

## 2021-01-15 ENCOUNTER — Other Ambulatory Visit: Payer: Self-pay

## 2021-01-15 DIAGNOSIS — Z8616 Personal history of COVID-19: Secondary | ICD-10-CM | POA: Diagnosis not present

## 2021-01-15 DIAGNOSIS — J069 Acute upper respiratory infection, unspecified: Secondary | ICD-10-CM | POA: Diagnosis not present

## 2021-01-15 DIAGNOSIS — Z87891 Personal history of nicotine dependence: Secondary | ICD-10-CM | POA: Diagnosis not present

## 2021-01-15 DIAGNOSIS — R051 Acute cough: Secondary | ICD-10-CM

## 2021-01-15 DIAGNOSIS — R0981 Nasal congestion: Secondary | ICD-10-CM | POA: Diagnosis not present

## 2021-01-15 DIAGNOSIS — Z20822 Contact with and (suspected) exposure to covid-19: Secondary | ICD-10-CM | POA: Insufficient documentation

## 2021-01-15 LAB — RESP PANEL BY RT-PCR (FLU A&B, COVID) ARPGX2
Influenza A by PCR: NEGATIVE
Influenza B by PCR: NEGATIVE
SARS Coronavirus 2 by RT PCR: NEGATIVE

## 2021-01-15 NOTE — Discharge Instructions (Signed)
-  We will call with the results of the COVID/flu test. - If you have the flu I can send antiviral medicine.  If you have COVID I can also send antiviral medicine.  If you have COVID you should be isolated 5 days and symptom onset (with today being day 1) and then wear a mask x5 days. - If those tests are negative, it could be another viral illness and his urine is coarse and should get better in 7 to 10 days. - If COVID is negative today I would do a home COVID test in 2 days and if that was negative I would be pretty confident that you do not have COVID. - Increase rest and fluids.  Continue with your over-the-counter DayQuil medication, ibuprofen. - Follow-up as needed for any severe worsening symptoms.

## 2021-01-15 NOTE — ED Provider Notes (Signed)
[facility]    CSN: [id number] Arrival date & time: 01/15/21  1421      History   Chief Complaint Chief Complaint  Patient presents with   Cough    HPI Norma Wall is a 49 y.o. female presenting for 24-hour history of fatigue, chills, body aches, cough congestion and sore throat as well as headache.  Patient says all of her symptoms are very mild.  She has taken DayQuil.  Plans to have Christmas celebration this weekend and wants to make sure she is not contagious to others and if she is she would like to get started on something ASAP.  Unknown if she had any fevers.  Has been taking DayQuil so that could have relieved the fever.  She denies any breathing difficulty, vomiting or diarrhea.  No sick contacts or known exposure to flu or COVID.  Presents to have COVID-19 last year.  HPI  Past Medical History:  Diagnosis Date   Abdominal pain, right upper quadrant 2013   Anxiety    Cancer ([place])    basal cells- [place]   COVID-19 2021   Endometriosis 01/2013   Gallbladder polyp    Lipidemia 2020   Lymphedema 01/2019   Migraine headache    3-4x/yr   Ovarian cyst 2014   right ovary   PONV (postoperative nausea and vomiting)    Vertigo    resolves quickly    Patient Active Problem List   Diagnosis Date Noted   Dysmenorrhea 09/25/2020   S/P laparoscopic hysterectomy 09/25/2020   Pelvic pain    Endometrioma    Colon cancer screening    Polyp of ascending colon    Leg pain 01/31/2019   Lymphedema 01/20/2019   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   Lipedema 01/10/2019   Endometriosis determined by laparoscopy 07/06/2018   Atypical chest pain 08/26/2017   Pruritus ani 08/24/2016   History of rectal bleeding 08/24/2016   Obesity (BMI 30-39.9) 11/20/2014   Dermatitis, eczematoid 11/20/2014   External hemorrhoid 11/20/2014   High potassium 11/20/2014   Bursitis 11/20/2014   Migraine 08/28/2014   Gallbladder polyp 09/13/2012   Avitaminosis D 05/19/2009     Past Surgical History:  Procedure Laterality Date   ABLATION  2012   cyst removal   CHOLECYSTECTOMY N/A 10/19/2018   Procedure: LAPAROSCOPIC CHOLECYSTECTOMY;  Surgeon: Jules Husbands, MD;  Location: [facility];  Service: General;  Laterality: N/A;   COLONOSCOPY WITH PROPOFOL N/A 07/21/2020   Procedure: COLONOSCOPY WITH PROPOFOL;  Surgeon: [name], MD;  Location: [place];  Service: Endoscopy;  Laterality: N/A;   CYSTOSCOPY N/A 09/25/2020   Procedure: CYSTOSCOPY;  Surgeon: Malachy Mood, MD;  Location: [facility];  Service: Gynecology;  Laterality: N/A;   CYSTOSCOPY W/ URETERAL STENT PLACEMENT Right 09/25/2020   Procedure: CYSTOSCOPY WITH RETROGRADE PYELOGRAM/URETERAL STENT PLACEMENT;  Surgeon: Billey Co, MD;  Location: [facility];  Service: Urology;  Laterality: Right;   LAPAROSCOPIC OVARIAN CYSTECTOMY Right 10/19/2018   Procedure: LAPAROSCOPIC RIGHT OVARIAN CYSTECTOMY;  Surgeon: Malachy Mood, MD;  Location: [facility];  Service: Gynecology;  Laterality: Right;   NECK SURGERY  02/22/2018   Artificial Disc replacement    OVARIAN CYST REMOVAL     POLYPECTOMY N/A 07/21/2020   Procedure: POLYPECTOMY;  Surgeon: [name], MD;  Location: [place];  Service: Endoscopy;  Laterality: N/A;   TOTAL LAPAROSCOPIC HYSTERECTOMY WITH SALPINGECTOMY Bilateral 09/25/2020   Procedure: TOTAL LAPAROSCOPIC HYSTERECTOMY WITH SALPINGECTOMY;  Surgeon: Malachy Mood, MD;  Location: [facility];  Service: Gynecology;  Laterality: Bilateral;    OB History     Gravida  2   Para  2   Term      Preterm      AB      Living  2      SAB      IAB      Ectopic      Multiple      Live Births           Obstetric Comments  1st Menstrual Cycle:  12 1st Pregnancy:  27          Home Medications    Prior to Admission medications   Medication Sig Start Date End Date Taking? Authorizing Provider  clonazePAM (KLONOPIN) 0.5 MG tablet TAKE 1 TABLET BY MOUTH TWICE  DAILY AS NEEDED FOR ANXIETY OR PALPITATIONS 04/16/19  Yes Kaydon, Creedon M, PA-C  SUMAtriptan (IMITREX) 50 MG tablet Take 1 tablet (50 mg total) by mouth every 2 (two) hours as needed for migraine. May repeat in 2 hours if headache persists or recurs. 01/01/20  Yes Mar Daring, PA-C  acetaminophen (TYLENOL) 500 MG tablet Take 1,000 mg by mouth every 6 (six) hours as needed for moderate pain, fever or headache.    [provider]  ibuprofen (ADVIL) 200 MG tablet Take 400-800 mg by mouth every 6 (six) hours as needed for mild pain or moderate pain.    [provider]  phenylephrine (SUDAFED PE) 10 MG TABS tablet Take 10 mg by mouth every 4 (four) hours as needed (nasal congestion).    [provider]    Family History Family History  Problem Relation Age of Onset   Lung cancer Father    Diabetes Mother    Stroke Mother    Diabetes Maternal Grandmother    Colon cancer Neg Hx    Breast cancer Neg Hx     Social History Social History   Tobacco Use   Smoking status: Former    Types: Cigarettes    Quit date: 02/01/1997    Years since quitting: 23.9   Smokeless tobacco: Never  Vaping Use   Vaping Use: Never used  Substance Use Topics   Alcohol use: Yes    Comment: occassional    Drug use: No     Allergies   Patient has no known allergies.   Review of Systems Review of Systems  Constitutional:  Positive for chills and fatigue. Negative for diaphoresis and fever.  HENT:  Positive for congestion, rhinorrhea and sore throat. Negative for ear pain, sinus pressure and sinus pain.   Respiratory:  Positive for cough. Negative for shortness of breath.   Cardiovascular:  Negative for chest pain.  Gastrointestinal:  Negative for abdominal pain, nausea and vomiting.  Musculoskeletal:  Positive for myalgias. Negative for arthralgias.  Skin:  Negative for rash.  Neurological:  Positive for headaches. Negative for weakness.  Hematological:  Negative for  adenopathy.    Physical Exam Triage Vital Signs ED Triage Vitals  Enc Vitals Group     BP 01/15/21 1500 119/77     Pulse Rate 01/15/21 1500 85     Resp 01/15/21 1500 18     Temp 01/15/21 1500 98.6 F (37 C)     Temp Source 01/15/21 1500 Oral     SpO2 01/15/21 1500 100 %     Weight 01/15/21 1458 197 lb 1.5 oz (89.4 kg)     Height 01/15/21 1458 5\' 7"  (1.702  m)     Head Circumference --      Peak Flow --      Pain Score 01/15/21 1458 3     Pain Loc --      Pain Edu? --      Excl. in [place]? --    No data found.  Updated Vital Signs BP 119/77 (BP Location: Right Arm)    Pulse 85    Temp 98.6 F (37 C) (Oral)    Resp 18    Ht 5\' 7"  (1.702 m)    Wt 197 lb 1.5 oz (89.4 kg)    LMP 09/08/2020    SpO2 100%    BMI 30.87 kg/m       Physical Exam Vitals and nursing note reviewed.  Constitutional:      General: She is not in acute distress.    Appearance: Normal appearance. She is not ill-appearing or toxic-appearing.  HENT:     Head: Normocephalic and atraumatic.     Nose: Congestion present.     Mouth/Throat:     Mouth: Mucous membranes are moist.     Pharynx: Oropharynx is clear.  Eyes:     General: No scleral icterus.       Right eye: No discharge.        Left eye: No discharge.     Conjunctiva/sclera: Conjunctivae normal.  Cardiovascular:     Rate and Rhythm: Normal rate and regular rhythm.     Heart sounds: Normal heart sounds.  Pulmonary:     Effort: Pulmonary effort is normal. No respiratory distress.     Breath sounds: Normal breath sounds.  Musculoskeletal:     Cervical back: Neck supple.  Skin:    General: Skin is dry.  Neurological:     General: No focal deficit present.     Mental Status: She is alert. Mental status is at baseline.     Motor: No weakness.     Gait: Gait normal.  Psychiatric:        Mood and Affect: Mood normal.        Behavior: Behavior normal.        Thought Content: Thought content normal.     UC Treatments / Results  Labs (all  labs ordered are listed, but only abnormal results are displayed) Labs Reviewed  RESP PANEL BY RT-PCR (FLU A&B, COVID) [id number]    EKG   Radiology No results found.  Procedures Procedures (including critical care time)  Medications Ordered in UC Medications - No data to display  Initial Impression / Assessment and Plan / UC Course  I have reviewed the triage vital signs and the nursing notes.  Pertinent labs & imaging results that were available during my care of the patient were reviewed by me and considered in my medical decision making (see chart for details).  50 year old female presenting for approximately 24-hour history of headache, chills, malaise, fatigue, cough, congestion and sore throat.  Vitals are normal and stable.  She is overall well-appearing.  Mild nasal congestion on exam.  Posterior pharynx is clear.  Chest clear to auscultation heart regular rate and rhythm.  Respiratory panel obtained to assess for flu and COVID.  Advised patient we will contact her with results and treat her appropriately.  If all negative, likely another viral illness and supportive care encouraged with increasing rest and fluids and continue over-the-counter cough medication.  Reviewed return and ER precautions.  All negative.  Patient made aware of results.  Final  Clinical Impressions(s) / UC Diagnoses   Final diagnoses:  Viral upper respiratory tract infection  Acute cough  Nasal congestion     Discharge Instructions      -We will call with the results of the COVID/flu test. - If you have the flu I can send antiviral medicine.  If you have COVID I can also send antiviral medicine.  If you have COVID you should be isolated 5 days and symptom onset (with today being day 1) and then wear a mask x5 days. - If those tests are negative, it could be another viral illness and his urine is coarse and should get better in 7 to 10 days. - If COVID is negative today I would do a home COVID  test in 2 days and if that was negative I would be pretty confident that you do not have COVID. - Increase rest and fluids.  Continue with your over-the-counter DayQuil medication, ibuprofen. - Follow-up as needed for any severe worsening symptoms.     ED Prescriptions   None    PDMP not reviewed this encounter.   Danton Clap, PA-C 01/15/21 1621

## 2021-01-15 NOTE — ED Triage Notes (Signed)
Pt c/o cough, nasal congestion, body aches, sore throat, headache. Started yesterday. Denies fever.

## 2021-01-16 ENCOUNTER — Other Ambulatory Visit: Payer: Self-pay

## 2021-01-16 ENCOUNTER — Other Ambulatory Visit: Payer: Self-pay | Admitting: Physician Assistant

## 2021-01-16 DIAGNOSIS — G43709 Chronic migraine without aura, not intractable, without status migrainosus: Secondary | ICD-10-CM

## 2021-01-16 NOTE — Telephone Encounter (Unsigned)
Copied from [place] [id number]. Topic: Quick Communication - Rx Refill/Question >> Jan 16, 2021  8:48 AM Tessa Lerner A wrote: Medication: SUMAtriptan (IMITREX) 50 MG tablet [[id number]]   Has the patient contacted their pharmacy? Yes.  The patient was directed by a member of pharmacy staff to contact their PCP (Agent: If no, request that the patient contact the pharmacy for the refill. If patient does not wish to contact the pharmacy document the reason why and proceed with request.) (Agent: If yes, when and what did the pharmacy advise?)  Preferred Pharmacy (with phone number or street name): [facility], [address] AT [address]  Phone:  [phone] Fax:  [phone]  Has the patient been seen for an appointment in the last year OR does the patient have an upcoming appointment? Yes.    Agent: Please be advised that RX refills may take up to 3 business days. We ask that you follow-up with your pharmacy.

## 2021-01-16 NOTE — Telephone Encounter (Signed)
Please advise refill?  LOV: 01/09/2020 NOV: 03/12/2021 Last refill: 01/01/2020 #10 w/5 refills 2021

## 2021-01-17 NOTE — Telephone Encounter (Signed)
refilled 01/16/21 By Junie Panning Mecum PA  Requested Prescriptions  Refused Prescriptions Disp Refills   SUMAtriptan (IMITREX) 50 MG tablet 10 tablet 0    Sig: TAKE 1 TABLET BY MOUTH EVERY 2 HOURS FOR MIGRAINE. MAY REPEAT IN 2 HOURS IF HEADACHE PERSISTS OR RECURS     Neurology:  Migraine Therapy - Triptan Failed - 01/17/2021  7:25 AM      Failed - Valid encounter within last 12 months    Recent Outpatient Visits          1 year ago Annual physical exam   [facility] [place], [place], [place]   2 years ago Annual physical exam   [place], [place], [place]   2 years ago Blepharitis of right upper eyelid, unspecified type   [facility], [place], [place]   2 years ago Elevated liver enzymes   [facility] [place], [place], [place]   3 years ago Annual physical exam   [place], [place], [place]      Future Appointments            In 1 month Bacigalupo, Dionne Bucy, MD [facility]           Passed - Last BP in normal range    BP Readings from Last 1 Encounters:  01/15/21 119/77

## 2021-02-27 ENCOUNTER — Ambulatory Visit (INDEPENDENT_AMBULATORY_CARE_PROVIDER_SITE_OTHER): Payer: Federal, State, Local not specified - PPO | Admitting: Physician Assistant

## 2021-02-27 ENCOUNTER — Other Ambulatory Visit: Payer: Self-pay

## 2021-02-27 ENCOUNTER — Encounter: Payer: Self-pay | Admitting: Physician Assistant

## 2021-02-27 VITALS — BP 115/78 | HR 76 | Ht 67.0 in | Wt 202.8 lb

## 2021-02-27 DIAGNOSIS — Z0000 Encounter for general adult medical examination without abnormal findings: Secondary | ICD-10-CM

## 2021-02-27 DIAGNOSIS — R609 Edema, unspecified: Secondary | ICD-10-CM

## 2021-02-27 DIAGNOSIS — G43709 Chronic migraine without aura, not intractable, without status migrainosus: Secondary | ICD-10-CM | POA: Diagnosis not present

## 2021-02-27 DIAGNOSIS — E559 Vitamin D deficiency, unspecified: Secondary | ICD-10-CM

## 2021-02-27 DIAGNOSIS — R079 Chest pain, unspecified: Secondary | ICD-10-CM | POA: Diagnosis not present

## 2021-02-27 DIAGNOSIS — Z6831 Body mass index (BMI) 31.0-31.9, adult: Secondary | ICD-10-CM

## 2021-02-27 NOTE — Assessment & Plan Note (Signed)
Historically ekg today normal sinus w/ no q wave, st elevation or depression, t wave inversion Monitor may have been musculoskeletal

## 2021-02-27 NOTE — Assessment & Plan Note (Signed)
Chronic no pain associated Better with weight loss Has compression device she uses sometimes

## 2021-02-27 NOTE — Progress Notes (Signed)
Complete physical exam   Patient: Norma Wall   DOB: December 02, 1970   51 y.o. Female  MRN: [id number] Visit Date: 02/27/2021  Today's healthcare provider: Mikey Kirschner, PA-C   Chief Complaint  Patient presents with   Annual Exam   Subjective    Norma Wall is a 51 y.o. female who presents today for a complete physical exam.  She reports consuming a general diet. The patient does not participate in regular exercise at present. She generally feels well. She reports sleeping well.   She reports a few weeks ago feeling a weight/heavy sensation in her chest as well as the left side of her back. Also felt a lot of muscle tension on that side. On and off for a few days and then pain relieved. H/o palpitations and cardiac w/u without abnormalities, historically neg stress test and normal echo.   Past Medical History:  Diagnosis Date   Abdominal pain, right upper quadrant 2013   Anxiety    Cancer ([place])    basal cells- [place]   COVID-19 2021   Endometriosis 01/2013   Gallbladder polyp    Lipidemia 2020   Lymphedema 01/2019   Migraine headache    3-4x/yr   Ovarian cyst 2014   right ovary   PONV (postoperative nausea and vomiting)    Vertigo    resolves quickly   Past Surgical History:  Procedure Laterality Date   ABLATION  2012   cyst removal   CHOLECYSTECTOMY N/A 10/19/2018   Procedure: LAPAROSCOPIC CHOLECYSTECTOMY;  Surgeon: Jules Husbands, MD;  Location: [facility];  Service: General;  Laterality: N/A;   COLONOSCOPY WITH PROPOFOL N/A 07/21/2020   Procedure: COLONOSCOPY WITH PROPOFOL;  Surgeon: [name], MD;  Location: [place];  Service: Endoscopy;  Laterality: N/A;   CYSTOSCOPY N/A 09/25/2020   Procedure: CYSTOSCOPY;  Surgeon: Malachy Mood, MD;  Location: [facility];  Service: Gynecology;  Laterality: N/A;   CYSTOSCOPY W/ URETERAL STENT PLACEMENT Right 09/25/2020   Procedure: CYSTOSCOPY WITH RETROGRADE PYELOGRAM/URETERAL STENT PLACEMENT;   Surgeon: Billey Co, MD;  Location: [facility];  Service: Urology;  Laterality: Right;   LAPAROSCOPIC OVARIAN CYSTECTOMY Right 10/19/2018   Procedure: LAPAROSCOPIC RIGHT OVARIAN CYSTECTOMY;  Surgeon: Malachy Mood, MD;  Location: [facility];  Service: Gynecology;  Laterality: Right;   NECK SURGERY  02/22/2018   Artificial Disc replacement    OVARIAN CYST REMOVAL     POLYPECTOMY N/A 07/21/2020   Procedure: POLYPECTOMY;  Surgeon: [name], MD;  Location: [place];  Service: Endoscopy;  Laterality: N/A;   TOTAL LAPAROSCOPIC HYSTERECTOMY WITH SALPINGECTOMY Bilateral 09/25/2020   Procedure: TOTAL LAPAROSCOPIC HYSTERECTOMY WITH SALPINGECTOMY;  Surgeon: Malachy Mood, MD;  Location: [facility];  Service: Gynecology;  Laterality: Bilateral;   Social History   Socioeconomic History   Marital status: Married    Spouse name: Darrell   Number of children: 1   Years of education: Not on file   Highest education level: Not on file  Occupational History   Not on file  Tobacco Use   Smoking status: Former    Types: Cigarettes    Quit date: 02/01/1997    Years since quitting: 24.0   Smokeless tobacco: Never  Vaping Use   Vaping Use: Never used  Substance and Sexual Activity   Alcohol use: Yes    Comment: occassional    Drug use: No   Sexual activity: Not Currently    Birth control/protection: Surgical  Other Topics Concern  Not on file  Social History Narrative   Not on file   Social Determinants of Health   Financial Resource Strain: Not on file  Food Insecurity: Not on file  Transportation Needs: Not on file  Physical Activity: Not on file  Stress: Not on file  Social Connections: Not on file  Intimate Partner Violence: Not on file   Family Status  Relation Name Status   Father  Deceased   Mother  Deceased   Brother  Alive   [place]  (Not Specified)   Neg Hx  (Not Specified)   Family History  Problem Relation Age of Onset   Lung cancer Father    Diabetes  Mother    Stroke Mother    Diabetes Maternal Grandmother    Colon cancer Neg Hx    Breast cancer Neg Hx    No Known Allergies  Patient Care Team: Mikey Kirschner, PA-C as PCP - General (Physician Assistant) Robert Bellow, MD (General Surgery) Margarita Rana, MD as Referring Physician (Family Medicine) Mar Daring, PA-C as Physician Assistant (Family Medicine)   Medications: Outpatient Medications Prior to Visit  Medication Sig   acetaminophen (TYLENOL) 500 MG tablet Take 1,000 mg by mouth every 6 (six) hours as needed for moderate pain, fever or headache.   clonazePAM (KLONOPIN) 0.5 MG tablet TAKE 1 TABLET BY MOUTH TWICE DAILY AS NEEDED FOR ANXIETY OR PALPITATIONS   ibuprofen (ADVIL) 200 MG tablet Take 400-800 mg by mouth every 6 (six) hours as needed for mild pain or moderate pain.   SUMAtriptan (IMITREX) 50 MG tablet TAKE 1 TABLET BY MOUTH EVERY 2 HOURS FOR MIGRAINE. MAY REPEAT IN 2 HOURS IF HEADACHE PERSISTS OR RECURS   [DISCONTINUED] phenylephrine (SUDAFED PE) 10 MG TABS tablet Take 10 mg by mouth every 4 (four) hours as needed (nasal congestion).   No facility-administered medications prior to visit.    Review of Systems  Constitutional: Negative.  Negative for fatigue and fever.  Eyes: Negative.   Respiratory:  Negative for cough and shortness of breath.   Cardiovascular:  Positive for leg swelling. Negative for chest pain.  Gastrointestinal: Negative.   Endocrine: Negative.   Genitourinary: Negative.   Musculoskeletal: Negative.   Skin: Negative.   Allergic/Immunologic: Negative.   Neurological: Negative.  Negative for dizziness and headaches.  Hematological: Negative.   Psychiatric/Behavioral: Negative.      Objective    BP 115/78 (BP Location: Right Arm, Patient Position: Sitting, Cuff Size: Normal)    Pulse 76    Ht 5\' 7"  (1.702 m)    Wt 202 lb 12.8 oz (92 kg)    LMP 09/08/2020    SpO2 100%    BMI 31.76 kg/m  Blood pressure 115/78, pulse 76, height  5\' 7"  (1.702 m), weight 202 lb 12.8 oz (92 kg), last menstrual period 09/08/2020, SpO2 100 %.   Physical Exam Constitutional:      General: She is awake.     Appearance: She is well-developed.  HENT:     Head: Normocephalic.     Right Ear: Tympanic membrane normal.     Left Ear: Tympanic membrane normal.  Eyes:     Conjunctiva/sclera: Conjunctivae normal.     Pupils: Pupils are equal, round, and reactive to light.  Neck:     Thyroid: No thyroid mass or thyromegaly.  Cardiovascular:     Rate and Rhythm: Normal rate and regular rhythm.     Heart sounds: Normal heart sounds.  Pulmonary:  Effort: Pulmonary effort is normal.     Breath sounds: Normal breath sounds.  Abdominal:     Palpations: Abdomen is soft.     Tenderness: There is no abdominal tenderness.  Musculoskeletal:     Right lower leg: No swelling. 2+ Pitting Edema present.     Left lower leg: No swelling. 2+ Pitting Edema present.  Lymphadenopathy:     Cervical: No cervical adenopathy.  Skin:    General: Skin is warm.  Neurological:     Mental Status: She is alert and oriented to person, place, and time.  Psychiatric:        Attention and Perception: Attention normal.        Mood and Affect: Mood normal.        Speech: Speech normal.        Behavior: Behavior is cooperative.    Last depression screening scores PHQ 2/9 Scores 02/27/2021 01/09/2020 01/05/2019  PHQ - 2 Score 0 0 0  PHQ- 9 Score 1 - 3   Last fall risk screening Fall Risk  02/27/2021  Falls in the past year? 0  Number falls in past yr: 0  Injury with Fall? 0  Risk for fall due to : No Fall Risks   Last Audit-C alcohol use screening Alcohol Use Disorder Test (AUDIT) 02/27/2021  1. How often do you have a drink containing alcohol? 1  2. How many drinks containing alcohol do you have on a typical day when you are drinking? 0  3. How often do you have six or more drinks on one occasion? 0  AUDIT-C Score 1  Alcohol Brief Interventions/Follow-up -    A score of 3 or more in women, and 4 or more in men indicates increased risk for alcohol abuse, EXCEPT if all of the points are from question 1   No results found for any visits on 02/27/21.  Assessment & Plan    Routine Health Maintenance and Physical Exam  Exercise Activities and Dietary recommendations --increase water intake --increase activity/exercise --focus on balanced diet   Immunization History  Administered Date(s) Administered   Influenza Split 11/15/2016   Influenza,inj,Quad PF,6+ Mos 11/15/2016, 01/05/2019   PFIZER(Purple Top)SARS-COV-2 Vaccination 02/21/2019, 03/12/2019, 12/24/2019   Tdap 04/06/2010    Health Maintenance  Topic Date Due   COVID-19 Vaccine (4 - Booster for Pfizer series) 03/15/2021 (Originally 02/18/2020)   INFLUENZA VACCINE  05/01/2021 (Originally 09/01/2020)   Zoster Vaccines- Shingrix (1 of 2) 05/28/2021 (Originally 01/29/2021)   TETANUS/TDAP  07/02/2021 (Originally 04/05/2020)   MAMMOGRAM  10/30/2021   PAP SMEAR-Modifier  07/15/2023   COLONOSCOPY (Pts 45-17yrs Insurance coverage will need to be confirmed)  07/21/2025   Hepatitis C Screening  Completed   HIV Screening  Completed   HPV VACCINES  Aged Out    Discussed health benefits of physical activity, and encouraged her to engage in regular exercise appropriate for her age and condition.  Problem List Items Addressed This Visit       Cardiovascular and Mediastinum   Migraine    Stable, w/ weather changes, continue abortive meds as needed        Musculoskeletal and Integument   Lipedema    Chronic no pain associated Better with weight loss Has compression device she uses sometimes        Other   Avitaminosis D   Relevant Orders   Vitamin D (25 hydroxy)   Chest pain    Historically ekg today normal sinus w/ no q wave, st  elevation or depression, t wave inversion Monitor may have been musculoskeletal      Relevant Orders   Lipid Profile   Comprehensive Metabolic Panel  (CMET)   EKG 12-Lead (Completed)   Other Visit Diagnoses     Encounter for physical examination    -  Primary   Relevant Orders   Lipid Profile   Comprehensive Metabolic Panel (CMET)   Vitamin D (25 hydroxy)   HgB A1c   BMI 31.0-31.9,adult       Relevant Orders   Lipid Profile   Comprehensive Metabolic Panel (CMET)   HgB A1c        Return in about 1 year (around 02/27/2022) for CPE.     I, Mikey Kirschner, PA-C have reviewed all documentation for this visit. The documentation on  02/27/2021 for the exam, diagnosis, procedures, and orders are all accurate and complete.    Mikey Kirschner, PA-C  [facility] [phone] (phone) [phone] (fax)  [place]

## 2021-02-27 NOTE — Assessment & Plan Note (Addendum)
Stable, w/ weather changes, continue abortive meds as needed

## 2021-02-28 LAB — COMPREHENSIVE METABOLIC PANEL
ALT: 24 IU/L (ref 0–32)
AST: 23 IU/L (ref 0–40)
Albumin/Globulin Ratio: 2 (ref 1.2–2.2)
Albumin: 4.7 g/dL (ref 3.8–4.8)
Alkaline Phosphatase: 44 IU/L (ref 44–121)
BUN/Creatinine Ratio: 23 (ref 9–23)
BUN: 18 mg/dL (ref 6–24)
Bilirubin Total: 0.3 mg/dL (ref 0.0–1.2)
CO2: 25 mmol/L (ref 20–29)
Calcium: 10.1 mg/dL (ref 8.7–10.2)
Chloride: 103 mmol/L (ref 96–106)
Creatinine, Ser: 0.8 mg/dL (ref 0.57–1.00)
Globulin, Total: 2.3 g/dL (ref 1.5–4.5)
Glucose: 87 mg/dL (ref 70–99)
Potassium: 4.8 mmol/L (ref 3.5–5.2)
Sodium: 139 mmol/L (ref 134–144)
Total Protein: 7 g/dL (ref 6.0–8.5)
eGFR: 90 mL/min/{1.73_m2} (ref >59)

## 2021-02-28 LAB — VITAMIN D 25 HYDROXY (VIT D DEFICIENCY, FRACTURES): Vit D, 25-Hydroxy: 34.1 ng/mL (ref 30.0–100.0)

## 2021-02-28 LAB — LIPID PANEL
Chol/HDL Ratio: 2.6 ratio (ref 0.0–4.4)
Cholesterol, Total: 181 mg/dL (ref 100–199)
HDL: 70 mg/dL (ref >39)
LDL Chol Calc (NIH): 97 mg/dL (ref 0–99)
Triglycerides: 77 mg/dL (ref 0–149)
VLDL Cholesterol Cal: 14 mg/dL (ref 5–40)

## 2021-02-28 LAB — HEMOGLOBIN A1C
Est. average glucose Bld gHb Est-mCnc: 97 mg/dL
Hgb A1c MFr Bld: 5 % (ref 4.8–5.6)

## 2021-03-02 ENCOUNTER — Encounter: Payer: Self-pay | Admitting: Physician Assistant

## 2021-03-10 ENCOUNTER — Other Ambulatory Visit: Payer: Self-pay | Admitting: Physician Assistant

## 2021-03-10 DIAGNOSIS — G43709 Chronic migraine without aura, not intractable, without status migrainosus: Secondary | ICD-10-CM

## 2021-03-11 NOTE — Telephone Encounter (Signed)
Requested Prescriptions  Pending Prescriptions Disp Refills   SUMAtriptan (IMITREX) 50 MG tablet [Pharmacy Med Name: SUMATRIPTAN 50MG  TABLETS] 10 tablet 2    Sig: TAKE 1 TABLET BY MOUTH EVERY 2 HOURS FOR MIGRAINE. MAY REPEAT IN 2 HOURS IF HEADACHE PERSISTS OR REOCCURS     Neurology:  Migraine Therapy - Triptan Passed - 03/10/2021  7:58 PM      Passed - Last BP in normal range    BP Readings from Last 1 Encounters:  02/27/21 115/78         Passed - Valid encounter within last 12 months    Recent Outpatient Visits          1 week ago Encounter for physical examination   [facility] Mikey Kirschner, PA-C   1 year ago Annual physical exam   [facility] [place], [place], [place]   2 years ago Annual physical exam   [place], [place], [place]   2 years ago Blepharitis of right upper eyelid, unspecified type   [place], [place]   2 years ago Elevated liver enzymes   [facility] [place], [place], [place]      Future Appointments            In 11 months Thedore Mins, Ria Comment, PA-C [employer], [place]

## 2021-03-12 ENCOUNTER — Encounter: Payer: Federal, State, Local not specified - PPO | Admitting: Family Medicine

## 2021-06-03 DIAGNOSIS — L578 Other skin changes due to chronic exposure to nonionizing radiation: Secondary | ICD-10-CM | POA: Diagnosis not present

## 2021-06-03 DIAGNOSIS — Z85828 Personal history of other malignant neoplasm of skin: Secondary | ICD-10-CM | POA: Diagnosis not present

## 2021-06-03 DIAGNOSIS — L814 Other melanin hyperpigmentation: Secondary | ICD-10-CM | POA: Diagnosis not present

## 2021-06-03 DIAGNOSIS — Z86018 Personal history of other benign neoplasm: Secondary | ICD-10-CM | POA: Diagnosis not present

## 2021-06-12 ENCOUNTER — Encounter: Payer: Self-pay | Admitting: Physician Assistant

## 2021-07-14 DIAGNOSIS — I83893 Varicose veins of bilateral lower extremities with other complications: Secondary | ICD-10-CM | POA: Diagnosis not present

## 2021-07-15 ENCOUNTER — Ambulatory Visit: Payer: Federal, State, Local not specified - PPO | Admitting: Family Medicine

## 2021-08-05 ENCOUNTER — Other Ambulatory Visit: Payer: Self-pay | Admitting: Physician Assistant

## 2021-08-05 DIAGNOSIS — G43709 Chronic migraine without aura, not intractable, without status migrainosus: Secondary | ICD-10-CM

## 2021-08-25 ENCOUNTER — Ambulatory Visit: Payer: Federal, State, Local not specified - PPO | Admitting: Family Medicine

## 2021-09-15 ENCOUNTER — Ambulatory Visit (INDEPENDENT_AMBULATORY_CARE_PROVIDER_SITE_OTHER): Payer: Federal, State, Local not specified - PPO | Admitting: Obstetrics & Gynecology

## 2021-09-15 ENCOUNTER — Encounter: Payer: Self-pay | Admitting: Obstetrics & Gynecology

## 2021-09-15 VITALS — BP 130/80 | Ht 67.0 in | Wt 217.0 lb

## 2021-09-15 DIAGNOSIS — Z01419 Encounter for gynecological examination (general) (routine) without abnormal findings: Secondary | ICD-10-CM

## 2021-09-15 DIAGNOSIS — Z124 Encounter for screening for malignant neoplasm of cervix: Secondary | ICD-10-CM | POA: Diagnosis not present

## 2021-09-15 DIAGNOSIS — Z1239 Encounter for other screening for malignant neoplasm of breast: Secondary | ICD-10-CM

## 2021-09-15 NOTE — Progress Notes (Signed)
Subjective:    Norma Wall is a 51 y.o. married P2 who presents for an annual exam. She reports some occasional right axillary discomfort but no masses. She is scheduled for a mammogram in the near future.The patient is sexually active. GYN screening history: last pap: was normal. The patient wears seatbelts: yes. The patient participates in regular exercise: not asked. Has the patient ever been transfused or tattooed?: yes. The patient reports that there is not domestic violence in her life.   Menstrual History: OB History     Gravida  2   Para  2   Term      Preterm      AB      Living  2      SAB      IAB      Ectopic      Multiple      Live Births           Obstetric Comments  1st Menstrual Cycle:  12 1st Pregnancy:  14         Menarche age: 58 Patient's last menstrual period was 09/08/2020.    The following portions of the patient's history were reviewed and updated as appropriate: allergies, current medications, past family history, past medical history, past social history, past surgical history, and problem list.  Review of Systems Pertinent items are noted in HPI.  She denies family history of breast, gyn, colon cancer   Objective:    BP 130/80   Ht '5\' 7"'$  (1.702 m)   Wt 217 lb (98.4 kg)   LMP 09/08/2020   BMI 33.99 kg/m   General Appearance:    Alert, cooperative, no distress, appears stated age  Head:    Normocephalic, without obvious abnormality, atraumatic  Eyes:    PERRL, conjunctiva/corneas clear, EOM's intact, fundi    benign, both eyes  Ears:    Normal TM's and external ear canals, both ears  Nose:   Nares normal, septum midline, mucosa normal, no drainage    or sinus tenderness  Throat:   Lips, mucosa, and tongue normal; teeth and gums normal  Neck:   Supple, symmetrical, trachea midline, no adenopathy;    thyroid:  no enlargement/tenderness/nodules; no carotid   bruit or JVD  Back:     Symmetric, no curvature, ROM  normal, no CVA tenderness  Lungs:     Clear to auscultation bilaterally, respirations unlabored  Chest Wall:    No tenderness or deformity   Heart:    Regular rate and rhythm, S1 and S2 normal, no murmur, rub   or gallop  Breast Exam:    No tenderness, masses, or nipple abnormality, no supraclavicular or axillary adenopathy, masses or pain  Abdomen:     Soft, non-tender, bowel sounds active all four quadrants,    no masses, no organomegaly  Genitalia:    Normal female without lesion, discharge or tenderness, speculum reveals moderate generalized prolapse, no masses or tenderness with bimanual exam     Extremities:   Extremities normal, atraumatic, no cyanosis or edema  Pulses:   2+ and symmetric all extremities  Skin:   Skin color, texture, turgor normal, no rashes or lesions  Lymph nodes:   Cervical, supraclavicular, and axillary nodes normal  Neurologic:   CNII-XII intact, normal strength, sensation and reflexes    throughout  .    Assessment:    Healthy female exam.    Plan:     Mammogram. ordered

## 2021-10-14 ENCOUNTER — Ambulatory Visit: Payer: Self-pay | Admitting: *Deleted

## 2021-10-14 NOTE — Telephone Encounter (Signed)
  Chief Complaint: called to report BP 96/56 Symptoms: no sx. Reports BP checked at 2:45 pm today at "donation site" and BP 96/56. Patient concerned BP too low. Reports taking klonopin 0.5 mg last evening and concerned may have caused low BP Frequency: today  Pertinent Negatives: Patient denies dizziness, no report of chest pain no difficulty breathing , no fainting. Eating and drinking well. Disposition: '[]'$ ED /'[]'$ Urgent Care (no appt availability in office) / '[]'$ Appointment(In office/virtual)/ '[]'$  [place] [facility]/ '[x]'$ Home Care/ '[]'$ Refused Recommended Disposition /'[]'$ [place] [facility]/ '[]'$  Follow-up with PCP Additional Notes:   Recommended patent drink plenty of fluids after donation and recheck BP after resting or if sx noted. If BP remains low contact PCP. Reviewed if dizziness, lightheadedness , fainting occurs go to ED if needed. Patient verbalized understanding.     Answer Assessment - Initial Assessment Questions 1. BLOOD PRESSURE: "What is the blood pressure?" "Did you take at least two measurements 5 minutes apart?"     BP 96/56  2. ONSET: "When did you take your blood pressure?"     Prior to 2:45 pm today  3. HOW: "How did you obtain the blood pressure?" (e.g., visiting nurse, automatic home BP monitor)     Checked at "donation site " 4. HISTORY: "Do you have a history of low blood pressure?" "What is your blood pressure normally?"    BP does run on the lower side per patient  5. MEDICINES: "Are you taking any medications for blood pressure?" If Yes, ask: "Have they been changed recently?"     no 6. PULSE RATE: "Do you know what your pulse rate is?"      na 7. OTHER SYMPTOMS: "Have you been sick recently?" "Have you had a recent injury?"     na 8. PREGNANCY: "Is there any chance you are pregnant?" "When was your last menstrual period?"     na  Protocols used: Blood Pressure - Low-A-AH

## 2021-10-14 NOTE — Telephone Encounter (Signed)
Agree with recommendations pec gave  May have been a combo of benzo and dehydration If symptoms or repeat low bp rec office visit

## 2021-10-15 NOTE — Telephone Encounter (Signed)
Pt. Called back and states she "is fine today." Will call if BP runs low again.

## 2021-10-15 NOTE — Telephone Encounter (Signed)
Pt. Reports BP yesterday came up to 110/79-119/79. Had to disconnect call due to emergency.

## 2021-11-02 ENCOUNTER — Ambulatory Visit
Admission: RE | Admit: 2021-11-02 | Discharge: 2021-11-02 | Disposition: A | Discharge status: Home / Self Care | Payer: Federal, State, Local not specified - PPO | Source: Ambulatory Visit | Attending: Obstetrics & Gynecology | Admitting: Obstetrics & Gynecology

## 2021-11-02 DIAGNOSIS — Z1239 Encounter for other screening for malignant neoplasm of breast: Secondary | ICD-10-CM | POA: Diagnosis not present

## 2021-11-02 DIAGNOSIS — Z01419 Encounter for gynecological examination (general) (routine) without abnormal findings: Secondary | ICD-10-CM | POA: Diagnosis not present

## 2021-11-02 DIAGNOSIS — Z1231 Encounter for screening mammogram for malignant neoplasm of breast: Secondary | ICD-10-CM | POA: Diagnosis not present

## 2021-11-12 DIAGNOSIS — I872 Venous insufficiency (chronic) (peripheral): Secondary | ICD-10-CM | POA: Diagnosis not present

## 2021-11-30 ENCOUNTER — Encounter (INDEPENDENT_AMBULATORY_CARE_PROVIDER_SITE_OTHER): Payer: Self-pay

## 2021-12-03 DIAGNOSIS — I872 Venous insufficiency (chronic) (peripheral): Secondary | ICD-10-CM | POA: Diagnosis not present

## 2021-12-17 DIAGNOSIS — I872 Venous insufficiency (chronic) (peripheral): Secondary | ICD-10-CM | POA: Diagnosis not present

## 2021-12-31 DIAGNOSIS — I872 Venous insufficiency (chronic) (peripheral): Secondary | ICD-10-CM | POA: Diagnosis not present

## 2022-01-05 ENCOUNTER — Other Ambulatory Visit: Payer: Self-pay | Admitting: Physician Assistant

## 2022-01-05 DIAGNOSIS — G43709 Chronic migraine without aura, not intractable, without status migrainosus: Secondary | ICD-10-CM

## 2022-01-05 NOTE — Telephone Encounter (Signed)
Requested Prescriptions  Pending Prescriptions Disp Refills   SUMAtriptan (IMITREX) 50 MG tablet [Pharmacy Med Name: SUMATRIPTAN '50MG'$  TABLETS] 10 tablet 2    Sig: TAKE 1 TABLET BY MOUTH EVERY 2 HOURS FOR MIGRAINE, MAY REPEAT IN 2 HOURS IF HEADACHE PERSISTS OR RECURS     Neurology:  Migraine Therapy - Triptan Passed - 01/05/2022  8:35 AM      Passed - Last BP in normal range    BP Readings from Last 1 Encounters:  09/15/21 130/80         Passed - Valid encounter within last 12 months    Recent Outpatient Visits           10 months ago Encounter for physical examination   [facility] Mikey Kirschner, PA-C   1 year ago Annual physical exam   [facility] [place], [place], [place]   3 years ago Annual physical exam   [place], [place], [place]   3 years ago Blepharitis of right upper eyelid, unspecified type   [place], [place]   3 years ago Elevated liver enzymes   [facility] [place], [place], [place]       Future Appointments             In 1 month Thedore Mins, Ria Comment, PA-C [employer], [place]

## 2022-01-12 DIAGNOSIS — I872 Venous insufficiency (chronic) (peripheral): Secondary | ICD-10-CM | POA: Diagnosis not present

## 2022-01-19 ENCOUNTER — Ambulatory Visit
Admission: EM | Admit: 2022-01-19 | Discharge: 2022-01-19 | Disposition: A | Discharge status: Home / Self Care | Payer: Federal, State, Local not specified - PPO | Attending: Urgent Care | Admitting: Urgent Care

## 2022-01-19 DIAGNOSIS — R6889 Other general symptoms and signs: Secondary | ICD-10-CM

## 2022-01-19 DIAGNOSIS — Z1152 Encounter for screening for COVID-19: Secondary | ICD-10-CM | POA: Insufficient documentation

## 2022-01-19 DIAGNOSIS — Z8616 Personal history of COVID-19: Secondary | ICD-10-CM | POA: Insufficient documentation

## 2022-01-19 DIAGNOSIS — R059 Cough, unspecified: Secondary | ICD-10-CM | POA: Insufficient documentation

## 2022-01-19 DIAGNOSIS — J029 Acute pharyngitis, unspecified: Secondary | ICD-10-CM | POA: Insufficient documentation

## 2022-01-19 LAB — RESP PANEL BY RT-PCR (FLU A&B, COVID) ARPGX2
Influenza A by PCR: NEGATIVE
Influenza B by PCR: NEGATIVE
SARS Coronavirus 2 by RT PCR: NEGATIVE

## 2022-01-19 MED ORDER — OSELTAMIVIR PHOSPHATE 75 MG PO CAPS: 75.0000 mg | ORAL_CAPSULE | Freq: Two times a day (BID) | ORAL | 0 refills | Status: DC

## 2022-01-19 NOTE — Discharge Instructions (Addendum)
You have been diagnosed with a viral upper respiratory infection based on your symptoms and exam. Viral illnesses cannot be treated with antibiotics - they are self limiting - and you should find your symptoms resolving within a few days. Get plenty of rest and non-caffeinated fluids.  We have performed a respiratory swab checking for COVID, and influenza. I have prescribed Tamiflu, antiviral therapy for influenza A, based on a presumptive diagnosis of influenza.  Someone will contact you after results of your swab are available with instructions to continue or stop this medication.  We recommend you use over-the-counter medications for symptom control including Tylenol or ibuprofen for fever, chills or body aches, and cold/cough medication.  Saline mist spray is helpful for removing excess mucus from your nose.  Room humidifiers are helpful to ease breathing at night. You might also find relief of nasal/sinus congestion symptoms by using a nasal decongestant such as Sudafed sinus (pseudoephedrine).  You will need to obtain this medication from behind the pharmacist counter.  Speak to the pharmacist to verify that you are not duplicating medications with other over-the-counter formulations that you may be using.   Follow up here or with your primary care provider if your symptoms are worsening or not improving.

## 2022-01-19 NOTE — ED Triage Notes (Signed)
Pt. Presents to UC w/ c/o a cough, sore throat and headache since yesterday.

## 2022-01-19 NOTE — ED Provider Notes (Signed)
[facility]    CSN: [id number] Arrival date & time: 01/19/22  4098      History   Chief Complaint Chief Complaint  Patient presents with   Cough   Headache   Sore Throat    HPI Norma Wall is a 51 y.o. female.    Cough Associated symptoms: headaches   Headache Associated symptoms: cough   Sore Throat Associated symptoms include headaches.    This urgent care with symptoms starting yesterday.  Symptoms including cough, sore throat, headache.  She says both of her symptoms are upper respiratory and includes sinus pressure with some pain in her ears.  She denies fever, chills, body aches.  Denies nausea, vomiting, diarrhea.  Past Medical History:  Diagnosis Date   Abdominal pain, right upper quadrant 2013   Anxiety    Cancer ([place])    basal cells- [place]   COVID-19 2021   Endometriosis 01/2013   Gallbladder polyp    Lipidemia 2020   Lymphedema 01/2019   Migraine headache    3-4x/yr   Ovarian cyst 2014   right ovary   PONV (postoperative nausea and vomiting)    Vertigo    resolves quickly    Patient Active Problem List   Diagnosis Date Noted   Dysmenorrhea 09/25/2020   S/P laparoscopic hysterectomy 09/25/2020   Pelvic pain    Endometrioma    Colon cancer screening    Polyp of ascending colon    Leg pain 01/31/2019   Lymphedema 01/20/2019   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   Lipedema 01/10/2019   Endometriosis determined by laparoscopy 07/06/2018   Chest pain 08/26/2017   Pruritus ani 08/24/2016   History of rectal bleeding 08/24/2016   Dermatitis, eczematoid 11/20/2014   External hemorrhoid 11/20/2014   High potassium 11/20/2014   Bursitis 11/20/2014   Migraine 08/28/2014   Gallbladder polyp 09/13/2012   Avitaminosis D 05/19/2009    Past Surgical History:  Procedure Laterality Date   ABLATION  2012   cyst removal   CHOLECYSTECTOMY N/A 10/19/2018   Procedure: LAPAROSCOPIC CHOLECYSTECTOMY;  Surgeon: Jules Husbands, MD;   Location: [facility];  Service: General;  Laterality: N/A;   COLONOSCOPY WITH PROPOFOL N/A 07/21/2020   Procedure: COLONOSCOPY WITH PROPOFOL;  Surgeon: [name], MD;  Location: [place];  Service: Endoscopy;  Laterality: N/A;   CYSTOSCOPY N/A 09/25/2020   Procedure: CYSTOSCOPY;  Surgeon: Malachy Mood, MD;  Location: [facility];  Service: Gynecology;  Laterality: N/A;   CYSTOSCOPY W/ URETERAL STENT PLACEMENT Right 09/25/2020   Procedure: CYSTOSCOPY WITH RETROGRADE PYELOGRAM/URETERAL STENT PLACEMENT;  Surgeon: Billey Co, MD;  Location: [facility];  Service: Urology;  Laterality: Right;   LAPAROSCOPIC OVARIAN CYSTECTOMY Right 10/19/2018   Procedure: LAPAROSCOPIC RIGHT OVARIAN CYSTECTOMY;  Surgeon: Malachy Mood, MD;  Location: [facility];  Service: Gynecology;  Laterality: Right;   NECK SURGERY  02/22/2018   Artificial Disc replacement    OVARIAN CYST REMOVAL     POLYPECTOMY N/A 07/21/2020   Procedure: POLYPECTOMY;  Surgeon: [name], MD;  Location: [place];  Service: Endoscopy;  Laterality: N/A;   TOTAL LAPAROSCOPIC HYSTERECTOMY WITH SALPINGECTOMY Bilateral 09/25/2020   Procedure: TOTAL LAPAROSCOPIC HYSTERECTOMY WITH SALPINGECTOMY;  Surgeon: Malachy Mood, MD;  Location: [facility];  Service: Gynecology;  Laterality: Bilateral;    OB History     Gravida  2   Para  2   Term      Preterm      AB  Living  2      SAB      IAB      Ectopic      Multiple      Live Births           Obstetric Comments  1st Menstrual Cycle:  12 1st Pregnancy:  27          Home Medications    Prior to Admission medications   Medication Sig Start Date End Date Taking? Authorizing Provider  acetaminophen (TYLENOL) 500 MG tablet Take 1,000 mg by mouth every 6 (six) hours as needed for moderate pain, fever or headache.    [provider]  clonazePAM (KLONOPIN) 0.5 MG tablet TAKE 1 TABLET BY MOUTH TWICE DAILY AS NEEDED FOR ANXIETY OR  PALPITATIONS 04/16/19   Mar Daring, PA-C  ibuprofen (ADVIL) 200 MG tablet Take 400-800 mg by mouth every 6 (six) hours as needed for mild pain or moderate pain.    [provider]  SUMAtriptan (IMITREX) 50 MG tablet TAKE 1 TABLET BY MOUTH EVERY 2 HOURS FOR MIGRAINE, MAY REPEAT IN 2 HOURS IF HEADACHE PERSISTS OR RECURS 01/05/22   Mikey Kirschner, PA-C    Family History Family History  Problem Relation Age of Onset   Lung cancer Father    Diabetes Mother    Stroke Mother    Diabetes Maternal Grandmother    Colon cancer Neg Hx    Breast cancer Neg Hx     Social History Social History   Tobacco Use   Smoking status: Former    Types: Cigarettes    Quit date: 02/01/1997    Years since quitting: 24.9   Smokeless tobacco: Never  Vaping Use   Vaping Use: Never used  Substance Use Topics   Alcohol use: Yes    Comment: occassional    Drug use: No     Allergies   Patient has no known allergies.   Review of Systems Review of Systems  Respiratory:  Positive for cough.   Neurological:  Positive for headaches.     Physical Exam Triage Vital Signs ED Triage Vitals  Enc Vitals Group     BP 01/19/22 0951 119/84     Pulse Rate 01/19/22 0951 75     Resp 01/19/22 0951 18     Temp 01/19/22 0951 98.6 F (37 C)     Temp Source 01/19/22 0951 Oral     SpO2 01/19/22 0951 95 %     Weight --      Height --      Head Circumference --      Peak Flow --      Pain Score 01/19/22 0953 0     Pain Loc --      Pain Edu? --      Excl. in [place]? --    No data found.  Updated Vital Signs BP 119/84 (BP Location: Right Arm)   Pulse 75   Temp 98.6 F (37 C) (Oral)   Resp 18   LMP 09/08/2020   SpO2 95%   Visual Acuity Right Eye Distance:   Left Eye Distance:   Bilateral Distance:    Right Eye Near:   Left Eye Near:    Bilateral Near:     Physical Exam Vitals reviewed.  Constitutional:      Appearance: She is ill-appearing.  HENT:     Right Ear: Tympanic  membrane normal.     Left Ear: Tympanic membrane normal.  Mouth/Throat:     Pharynx: Posterior oropharyngeal erythema present. No oropharyngeal exudate.     Tonsils: No tonsillar exudate.  Cardiovascular:     Rate and Rhythm: Normal rate and regular rhythm.     Heart sounds: Normal heart sounds.  Pulmonary:     Effort: Pulmonary effort is normal.     Breath sounds: Normal breath sounds.  Neurological:     Mental Status: She is alert and oriented to person, place, and time.  Psychiatric:        Mood and Affect: Mood normal.        Behavior: Behavior normal.      UC Treatments / Results  Labs (all labs ordered are listed, but only abnormal results are displayed) Labs Reviewed  RESP PANEL BY RT-PCR (FLU A&B, COVID) [id number]    EKG   Radiology No results found.  Procedures Procedures (including critical care time)  Medications Ordered in UC Medications - No data to display  Initial Impression / Assessment and Plan / UC Course  I have reviewed the triage vital signs and the nursing notes.  Pertinent labs & imaging results that were available during my care of the patient were reviewed by me and considered in my medical decision making (see chart for details).   Patient is afebrile here without recent antipyretics. Satting well on room air. Overall is ill appearing, though well hydrated and without respiratory distress. Pulmonary exam is unremarkable.  Lungs CTAB without wheezes, rhonchi, rales.  TMs WNL bilaterally.  Mild erythema in her pharynx without peritonsillar exudates.  Suspect viral process including influenza and will treat for influenza a presumptively with Tamiflu while awaiting results of respiratory swab.  Recommending continued use of OTC medication for symptom control.  Final Clinical Impressions(s) / UC Diagnoses   Final diagnoses:  Flu-like symptoms   Discharge Instructions   None    ED Prescriptions   None    PDMP not reviewed this  encounter.   Rose Phi, [place] 01/19/22 1044

## 2022-01-21 ENCOUNTER — Ambulatory Visit
Admission: EM | Admit: 2022-01-21 | Discharge: 2022-01-21 | Disposition: A | Discharge status: Home / Self Care | Payer: Federal, State, Local not specified - PPO | Attending: Emergency Medicine | Admitting: Emergency Medicine

## 2022-01-21 DIAGNOSIS — J029 Acute pharyngitis, unspecified: Secondary | ICD-10-CM

## 2022-01-21 DIAGNOSIS — H6692 Otitis media, unspecified, left ear: Secondary | ICD-10-CM | POA: Diagnosis not present

## 2022-01-21 LAB — POCT RAPID STREP A (OFFICE): Rapid Strep A Screen: NEGATIVE

## 2022-01-21 MED ORDER — AMOXICILLIN 875 MG PO TABS: 875.0000 mg | ORAL_TABLET | Freq: Two times a day (BID) | ORAL | 0 refills | Status: AC

## 2022-01-21 NOTE — ED Provider Notes (Signed)
Roderic Palau    CSN: [id number] Arrival date & time: 01/21/22  1710      History   Chief Complaint Chief Complaint  Patient presents with   Sore Throat    HPI Norma Wall is a 51 y.o. female.  Patient presents with sore throat and ear pain.  She denies fever, rash, shortness of breath, vomiting, diarrhea, or other symptoms.  Patient was seen at this urgent care on 01/19/2022; diagnosed with flulike symptoms; treated with Tamiflu; respiratory panel negative.  Patient stopped the Tamiflu when she got the negative test result.  The history is provided by the patient and medical records.    Past Medical History:  Diagnosis Date   Abdominal pain, right upper quadrant 2013   Anxiety    Cancer ([place])    basal cells- [place]   COVID-19 2021   Endometriosis 01/2013   Gallbladder polyp    Lipidemia 2020   Lymphedema 01/2019   Migraine headache    3-4x/yr   Ovarian cyst 2014   right ovary   PONV (postoperative nausea and vomiting)    Vertigo    resolves quickly    Patient Active Problem List   Diagnosis Date Noted   Dysmenorrhea 09/25/2020   S/P laparoscopic hysterectomy 09/25/2020   Pelvic pain    Endometrioma    Colon cancer screening    Polyp of ascending colon    Leg pain 01/31/2019   Lymphedema 01/20/2019   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   Lipedema 01/10/2019   Endometriosis determined by laparoscopy 07/06/2018   Chest pain 08/26/2017   Pruritus ani 08/24/2016   History of rectal bleeding 08/24/2016   Dermatitis, eczematoid 11/20/2014   External hemorrhoid 11/20/2014   High potassium 11/20/2014   Bursitis 11/20/2014   Migraine 08/28/2014   Gallbladder polyp 09/13/2012   Avitaminosis D 05/19/2009    Past Surgical History:  Procedure Laterality Date   ABLATION  2012   cyst removal   CHOLECYSTECTOMY N/A 10/19/2018   Procedure: LAPAROSCOPIC CHOLECYSTECTOMY;  Surgeon: Jules Husbands, MD;  Location: [facility];  Service: General;  Laterality:  N/A;   COLONOSCOPY WITH PROPOFOL N/A 07/21/2020   Procedure: COLONOSCOPY WITH PROPOFOL;  Surgeon: [name], MD;  Location: [place];  Service: Endoscopy;  Laterality: N/A;   CYSTOSCOPY N/A 09/25/2020   Procedure: CYSTOSCOPY;  Surgeon: Malachy Mood, MD;  Location: [facility];  Service: Gynecology;  Laterality: N/A;   CYSTOSCOPY W/ URETERAL STENT PLACEMENT Right 09/25/2020   Procedure: CYSTOSCOPY WITH RETROGRADE PYELOGRAM/URETERAL STENT PLACEMENT;  Surgeon: Billey Co, MD;  Location: [facility];  Service: Urology;  Laterality: Right;   LAPAROSCOPIC OVARIAN CYSTECTOMY Right 10/19/2018   Procedure: LAPAROSCOPIC RIGHT OVARIAN CYSTECTOMY;  Surgeon: Malachy Mood, MD;  Location: [facility];  Service: Gynecology;  Laterality: Right;   NECK SURGERY  02/22/2018   Artificial Disc replacement    OVARIAN CYST REMOVAL     POLYPECTOMY N/A 07/21/2020   Procedure: POLYPECTOMY;  Surgeon: [name], MD;  Location: [place];  Service: Endoscopy;  Laterality: N/A;   TOTAL LAPAROSCOPIC HYSTERECTOMY WITH SALPINGECTOMY Bilateral 09/25/2020   Procedure: TOTAL LAPAROSCOPIC HYSTERECTOMY WITH SALPINGECTOMY;  Surgeon: Malachy Mood, MD;  Location: [facility];  Service: Gynecology;  Laterality: Bilateral;    OB History     Gravida  2   Para  2   Term      Preterm      AB      Living  2      SAB  IAB      Ectopic      Multiple      Live Births           Obstetric Comments  1st Menstrual Cycle:  12 1st Pregnancy:  27          Home Medications    Prior to Admission medications   Medication Sig Start Date End Date Taking? Authorizing Provider  amoxicillin (AMOXIL) 875 MG tablet Take 1 tablet (875 mg total) by mouth 2 (two) times daily for 10 days. 01/21/22 01/31/22 Yes Sharion Balloon, NP  acetaminophen (TYLENOL) 500 MG tablet Take 1,000 mg by mouth every 6 (six) hours as needed for moderate pain, fever or headache.    [provider]   clonazePAM (KLONOPIN) 0.5 MG tablet TAKE 1 TABLET BY MOUTH TWICE DAILY AS NEEDED FOR ANXIETY OR PALPITATIONS 04/16/19   Mar Daring, PA-C  ibuprofen (ADVIL) 200 MG tablet Take 400-800 mg by mouth every 6 (six) hours as needed for mild pain or moderate pain.    [provider]  SUMAtriptan (IMITREX) 50 MG tablet TAKE 1 TABLET BY MOUTH EVERY 2 HOURS FOR MIGRAINE, MAY REPEAT IN 2 HOURS IF HEADACHE PERSISTS OR RECURS 01/05/22   Mikey Kirschner, PA-C    Family History Family History  Problem Relation Age of Onset   Lung cancer Father    Diabetes Mother    Stroke Mother    Diabetes Maternal Grandmother    Colon cancer Neg Hx    Breast cancer Neg Hx     Social History Social History   Tobacco Use   Smoking status: Former    Types: Cigarettes    Quit date: 02/01/1997    Years since quitting: 24.9   Smokeless tobacco: Never  Vaping Use   Vaping Use: Never used  Substance Use Topics   Alcohol use: Yes    Comment: occassional    Drug use: No     Allergies   Patient has no known allergies.   Review of Systems Review of Systems  Constitutional:  Negative for chills and fever.  HENT:  Positive for ear pain and sore throat.   Respiratory:  Negative for cough and shortness of breath.   Cardiovascular:  Negative for chest pain and palpitations.  Gastrointestinal:  Negative for diarrhea and vomiting.  Skin:  Negative for color change and rash.  All other systems reviewed and are negative.    Physical Exam Triage Vital Signs ED Triage Vitals  Enc Vitals Group     BP 01/21/22 1845 114/88     Pulse Rate 01/21/22 1836 72     Resp 01/21/22 1836 18     Temp 01/21/22 1836 97.8 F (36.6 C)     Temp src --      SpO2 01/21/22 1836 97 %     Weight 01/21/22 1844 220 lb (99.8 kg)     Height 01/21/22 1844 '5\' 7"'$  (1.702 m)     Head Circumference --      Peak Flow --      Pain Score 01/21/22 1843 6     Pain Loc --      Pain Edu? --      Excl. in [place]? --    No data  found.  Updated Vital Signs BP 114/88   Pulse 72   Temp 97.8 F (36.6 C)   Resp 18   Ht '5\' 7"'$  (1.702 m)   Wt 220 lb (99.8 kg)  LMP 09/08/2020   SpO2 97%   BMI 34.46 kg/m   Visual Acuity Right Eye Distance:   Left Eye Distance:   Bilateral Distance:    Right Eye Near:   Left Eye Near:    Bilateral Near:     Physical Exam Vitals and nursing note reviewed.  Constitutional:      General: She is not in acute distress.    Appearance: She is well-developed.  HENT:     Head: Normocephalic and atraumatic.     Right Ear: Tympanic membrane normal.     Ears:     Comments: Left TM erythematous.    Nose: Nose normal.     Mouth/Throat:     Pharynx: Posterior oropharyngeal erythema present.  Cardiovascular:     Rate and Rhythm: Normal rate and regular rhythm.     Heart sounds: Normal heart sounds.  Pulmonary:     Effort: Pulmonary effort is normal. No respiratory distress.     Breath sounds: Normal breath sounds.  Musculoskeletal:     Cervical back: Neck supple.  Skin:    General: Skin is warm and dry.  Neurological:     Mental Status: She is alert.  Psychiatric:        Mood and Affect: Mood normal.        Behavior: Behavior normal.      UC Treatments / Results  Labs (all labs ordered are listed, but only abnormal results are displayed) Labs Reviewed  POCT RAPID STREP A (OFFICE)    EKG   Radiology No results found.  Procedures Procedures (including critical care time)  Medications Ordered in UC Medications - No data to display  Initial Impression / Assessment and Plan / UC Course  I have reviewed the triage vital signs and the nursing notes.  Pertinent labs & imaging results that were available during my care of the patient were reviewed by me and considered in my medical decision making (see chart for details).    Left otitis media, acute pharyngitis.  Rapid strep negative.  Patient had negative COVID and flu test on 01/19/2022.  Treating today with  amoxicillin.  Tylenol or ibuprofen as needed.  Instructed patient to follow up with her PCP if her symptoms are not improving.  She agrees to plan of care.    Final Clinical Impressions(s) / UC Diagnoses   Final diagnoses:  Left otitis media, unspecified otitis media type  Acute pharyngitis, unspecified etiology     Discharge Instructions      Take the amoxicillin as directed.  Follow up with your primary care provider if your symptoms are not improving.        ED Prescriptions     Medication Sig Dispense Auth. Provider   amoxicillin (AMOXIL) 875 MG tablet Take 1 tablet (875 mg total) by mouth 2 (two) times daily for 10 days. 20 tablet Sharion Balloon, NP      PDMP not reviewed this encounter.   Sharion Balloon, NP 01/21/22 1929

## 2022-01-21 NOTE — ED Triage Notes (Signed)
Patient to Urgent Care with complaints of sore throat. Symptoms started Monday, worsened today. Initially felt raw from drainage. Denies any known fevers.  Flu and covid negative on Tuesday.

## 2022-01-21 NOTE — Discharge Instructions (Addendum)
Take the amoxicillin as directed.  Follow up with your primary care provider if your symptoms are not improving.   ° ° °

## 2022-01-27 DIAGNOSIS — I872 Venous insufficiency (chronic) (peripheral): Secondary | ICD-10-CM | POA: Diagnosis not present

## 2022-02-05 ENCOUNTER — Ambulatory Visit: Payer: Federal, State, Local not specified - PPO | Admitting: Physician Assistant

## 2022-02-05 ENCOUNTER — Encounter: Payer: Self-pay | Admitting: Physician Assistant

## 2022-02-05 VITALS — BP 124/84 | HR 84 | Temp 97.6°F | Resp 16 | Wt 224.0 lb

## 2022-02-05 DIAGNOSIS — J0110 Acute frontal sinusitis, unspecified: Secondary | ICD-10-CM | POA: Diagnosis not present

## 2022-02-05 DIAGNOSIS — R051 Acute cough: Secondary | ICD-10-CM

## 2022-02-05 LAB — POC COVID19 BINAXNOW: SARS Coronavirus 2 Ag: NEGATIVE

## 2022-02-05 LAB — POCT INFLUENZA A/B
Influenza A, POC: NEGATIVE
Influenza B, POC: NEGATIVE

## 2022-02-05 MED ORDER — AZELASTINE HCL 0.1 % NA SOLN: 1.0000 | Freq: Two times a day (BID) | NASAL | 1 refills | Status: DC

## 2022-02-05 NOTE — Progress Notes (Signed)
I,Sulibeya S Dimas,acting as a Education administrator for [employer], PA-C.,have documented all relevant documentation on the behalf of Mikey Kirschner, PA-C,as directed by  Mikey Kirschner, PA-C while in the presence of Mikey Kirschner, PA-C.     Established patient visit   Patient: Norma Wall   DOB: 02-09-1970   52 y.o. Female  MRN: [id number] Visit Date: 02/05/2022  Today's healthcare provider: Mikey Kirschner, PA-C   Chief Complaint  Patient presents with   URI   Subjective    HPI  Upper respiratory symptoms She complains of bilateral ear pressure/pain, congestion, non productive cough, shortness of breath, sinus pressure, and sore throat.with no fever, chills, night sweats or weight loss. Onset of symptoms was  2 days ago and staying constant.She is drinking plenty of fluids.  Past history is significant for no history of pneumonia or bronchitis. Patient is non-smoker. She is using mucinex, saline and afrin periodically over the counter.  She was most recently see for a left ear infection at urgent care 12/21 and was treated with amoxicillin.  She was also empirically treated with tamiflu 12/19 for viral flu-like symptoms. The viral swab returned negative  Recent exposure to flu-- daughter is positive. ---------------------------------------------------------------------------------------------------   Medications: Outpatient Medications Prior to Visit  Medication Sig   acetaminophen (TYLENOL) 500 MG tablet Take 1,000 mg by mouth every 6 (six) hours as needed for moderate pain, fever or headache.   ibuprofen (ADVIL) 200 MG tablet Take 400-800 mg by mouth every 6 (six) hours as needed for mild pain or moderate pain.   SUMAtriptan (IMITREX) 50 MG tablet TAKE 1 TABLET BY MOUTH EVERY 2 HOURS FOR MIGRAINE, MAY REPEAT IN 2 HOURS IF HEADACHE PERSISTS OR RECURS   clonazePAM (KLONOPIN) 0.5 MG tablet TAKE 1 TABLET BY MOUTH TWICE DAILY AS NEEDED FOR ANXIETY OR PALPITATIONS (Patient not  taking: Reported on 02/05/2022)   No facility-administered medications prior to visit.    Review of Systems  Constitutional:  Positive for diaphoresis and fatigue. Negative for chills and fever.  HENT:  Positive for congestion, ear pain, postnasal drip, rhinorrhea, sinus pressure, sinus pain, sneezing and sore throat.   Respiratory:  Positive for cough and shortness of breath. Negative for wheezing.   Gastrointestinal:  Negative for abdominal pain, nausea and vomiting.  Neurological:  Positive for light-headedness and headaches.        Objective    BP 124/84 (BP Location: Left Arm, Patient Position: Sitting, Cuff Size: Large)   Pulse 84   Temp 97.6 F (36.4 C) (Oral)   Resp 16   Wt 224 lb (101.6 kg)   LMP 09/08/2020   SpO2 96%   BMI 35.08 kg/m  BP Readings from Last 3 Encounters:  02/05/22 124/84  01/21/22 114/88  01/19/22 119/84   Wt Readings from Last 3 Encounters:  02/05/22 224 lb (101.6 kg)  01/21/22 220 lb (99.8 kg)  09/15/21 217 lb (98.4 kg)      Physical Exam Constitutional:      General: She is awake.     Appearance: She is well-developed.  HENT:     Head: Normocephalic.     Right Ear: Tympanic membrane normal.     Left Ear: Tympanic membrane normal.     Mouth/Throat:     Pharynx: Posterior oropharyngeal erythema present. No oropharyngeal exudate.  Eyes:     Conjunctiva/sclera: Conjunctivae normal.  Cardiovascular:     Rate and Rhythm: Normal rate and regular rhythm.     Heart sounds: Normal heart  sounds.  Pulmonary:     Effort: Pulmonary effort is normal.     Breath sounds: Normal breath sounds. No wheezing, rhonchi or rales.  Skin:    General: Skin is warm.  Neurological:     Mental Status: She is alert and oriented to person, place, and time.  Psychiatric:        Attention and Perception: Attention normal.        Mood and Affect: Mood normal.        Speech: Speech normal.        Behavior: Behavior is cooperative.      Results for orders  placed or performed in visit on 02/05/22  POC COVID-19  Result Value Ref Range   SARS Coronavirus 2 Ag Negative Negative  POCT Influenza A/B  Result Value Ref Range   Influenza A, POC Negative Negative   Influenza B, POC Negative Negative    Assessment & Plan     Acute sinusitis Poc flu neg poc covid neg Given recent abx treatment likely residual viral/inflammatory cough and mucous production Recommending continuing mucinex, adding an antihistamine-- zyrtec or claritin,  And switching from nasal saline to nasal lavage; instructed pt on use -- to use distilled or spring water or boil water(and let cool!) before use Rx azelastine nasal spray to use instead of afrin  Return if symptoms worsen or fail to improve.      I, Mikey Kirschner, PA-C have reviewed all documentation for this visit. The documentation on  02/05/22  for the exam, diagnosis, procedures, and orders are all accurate and complete.  Mikey Kirschner, PA-C [facility] [address] [place], [place], [address] Office: [phone] Fax: [place]

## 2022-02-11 DIAGNOSIS — I872 Venous insufficiency (chronic) (peripheral): Secondary | ICD-10-CM | POA: Diagnosis not present

## 2022-03-01 ENCOUNTER — Encounter: Payer: Self-pay | Admitting: Physician Assistant

## 2022-03-01 ENCOUNTER — Ambulatory Visit (INDEPENDENT_AMBULATORY_CARE_PROVIDER_SITE_OTHER): Payer: Federal, State, Local not specified - PPO | Admitting: Physician Assistant

## 2022-03-01 VITALS — BP 108/76 | HR 91 | Temp 97.7°F | Wt 230.0 lb

## 2022-03-01 DIAGNOSIS — Z0000 Encounter for general adult medical examination without abnormal findings: Secondary | ICD-10-CM | POA: Diagnosis not present

## 2022-03-01 DIAGNOSIS — R1011 Right upper quadrant pain: Secondary | ICD-10-CM | POA: Insufficient documentation

## 2022-03-01 DIAGNOSIS — E559 Vitamin D deficiency, unspecified: Secondary | ICD-10-CM

## 2022-03-01 DIAGNOSIS — Z23 Encounter for immunization: Secondary | ICD-10-CM

## 2022-03-01 DIAGNOSIS — R1032 Left lower quadrant pain: Secondary | ICD-10-CM | POA: Diagnosis not present

## 2022-03-01 DIAGNOSIS — R319 Hematuria, unspecified: Secondary | ICD-10-CM

## 2022-03-01 DIAGNOSIS — G43709 Chronic migraine without aura, not intractable, without status migrainosus: Secondary | ICD-10-CM | POA: Diagnosis not present

## 2022-03-01 LAB — POCT URINALYSIS DIPSTICK
Bilirubin, UA: NEGATIVE
Glucose, UA: NEGATIVE
Ketones, UA: NEGATIVE
Leukocytes, UA: NEGATIVE
Nitrite, UA: NEGATIVE
Protein, UA: POSITIVE — AB
Spec Grav, UA: 1.01 (ref 1.010–1.025)
Urobilinogen, UA: 0.2 E.U./dL
pH, UA: 5 (ref 5.0–8.0)

## 2022-03-01 NOTE — Assessment & Plan Note (Addendum)
H/o cholecystectomy 10/2018 Nontender on exam Advised that ultrasound could demonstrate residual sludge or stones; pt reports not a priority to investigate  Cbc/cmp /lipase ordered

## 2022-03-01 NOTE — Progress Notes (Signed)
I,Norma Wall,acting as a Education administrator for [employer], Wall.,have documented all relevant documentation on the behalf of Norma Wall,as directed by  Norma Wall while in the presence of Norma Wall.   Complete physical exam   Patient: Norma Wall   DOB: [birth year]   52 y.o. Female  MRN: [id number] Visit Date: 03/01/2022  Today's healthcare provider: Mikey Kirschner, Wall   Cc. cpe  Subjective    Norma Wall is a 52 y.o. female who presents today for a complete physical exam.   She does have additional problems to discuss today.  HPI  Pt reports mild RUQ discomfort on and off x 10 years. Sp cholecystectomy for cholelithiasis and RUQ pain 10/2018. Pain is unchanged. Infrequent, no patter associated. Reports sometimes the pain radiates to her back.   She also reports LLQ pain x few weeks. Some association with bowel movements, denies diarrhea, reports more often that not is constipated. Denies blood in stool. H/o hysterectomy, right oophorectomy and b/l salpingectomy 8/22 with endometriosis seen. Left ovary remains. Denies hematuria, dysuria, urinary frequency or urgency.   Past Medical History:  Diagnosis Date   Abdominal pain, right upper quadrant 2013   Anxiety    Cancer ([place])    basal cells- [place]   COVID-19 2021   Endometriosis 01/2013   Gallbladder polyp    Lipidemia 2020   Lymphedema 01/2019   Migraine headache    3-4x/yr   Ovarian cyst 2014   right ovary   PONV (postoperative nausea and vomiting)    Vertigo    resolves quickly   Past Surgical History:  Procedure Laterality Date   ABLATION  2012   cyst removal   CHOLECYSTECTOMY N/A 10/19/2018   Procedure: LAPAROSCOPIC CHOLECYSTECTOMY;  Surgeon: Norma Husbands, Wall;  Location: [facility];  Service: General;  Laterality: N/A;   COLONOSCOPY WITH PROPOFOL N/A 07/21/2020   Procedure: COLONOSCOPY WITH PROPOFOL;  Surgeon: Norma Lame, Wall;  Location: [facility];   Service: Endoscopy;  Laterality: N/A;   CYSTOSCOPY N/A 09/25/2020   Procedure: CYSTOSCOPY;  Surgeon: Norma Mood, Wall;  Location: [facility];  Service: Gynecology;  Laterality: N/A;   CYSTOSCOPY W/ URETERAL STENT PLACEMENT Right 09/25/2020   Procedure: CYSTOSCOPY WITH RETROGRADE PYELOGRAM/URETERAL STENT PLACEMENT;  Surgeon: Norma Co, Wall;  Location: [facility];  Service: Urology;  Laterality: Right;   LAPAROSCOPIC OVARIAN CYSTECTOMY Right 10/19/2018   Procedure: LAPAROSCOPIC RIGHT OVARIAN CYSTECTOMY;  Surgeon: Norma Mood, Wall;  Location: [facility];  Service: Gynecology;  Laterality: Right;   NECK SURGERY  02/22/2018   Artificial Disc replacement    OVARIAN CYST REMOVAL     POLYPECTOMY N/A 07/21/2020   Procedure: POLYPECTOMY;  Surgeon: Norma Lame, Wall;  Location: [place];  Service: Endoscopy;  Laterality: N/A;   TOTAL LAPAROSCOPIC HYSTERECTOMY WITH SALPINGECTOMY Bilateral 09/25/2020   Procedure: TOTAL LAPAROSCOPIC HYSTERECTOMY WITH SALPINGECTOMY;  Surgeon: Norma Mood, Wall;  Location: [facility];  Service: Gynecology;  Laterality: Bilateral;   Social History   Socioeconomic History   Marital status: Married    Spouse name: Norma Wall   Number of children: 1   Years of education: Not on file   Highest education level: Not on file  Occupational History   Not on file  Tobacco Use   Smoking status: Former    Types: Cigarettes    Quit date: 02/01/1997    Years since quitting: 25.0   Smokeless tobacco: Never  Vaping Use   Vaping Use: Never used  Substance and Sexual Activity   Alcohol use: Yes    Comment: occassional    Drug use: No   Sexual activity: Yes    Birth control/protection: Surgical  Other Topics Concern   Not on file  Social History Narrative   Not on file   Social Determinants of Health   Financial Resource Strain: Not on file  Food Insecurity: Not on file  Transportation Needs: Not on file  Physical Activity: Inactive (02/28/2017)   Exercise  Vital Sign    Days of Exercise per Week: 0 days    Minutes of Exercise per Session: 0 min  Stress: Stress Concern Present (02/28/2017)   [place]    Feeling of Stress : Rather much  Social Connections: Moderately Integrated (02/28/2017)   Social Connection and Isolation Panel [NHANES]    Frequency of Communication with Friends and Family: More than three times a week    Frequency of Social Gatherings with Friends and Family: More than three times a week    Attends Religious Services: More than 4 times per year    Active Member of [employer] or Organizations: No    Attends Archivist Meetings: Never    Marital Status: Married  Human resources officer Violence: Not At Risk (02/28/2017)   Humiliation, Afraid, Rape, and Kick questionnaire    Fear of Current or Ex-Partner: No    Emotionally Abused: No    Physically Abused: No    Sexually Abused: No   Family Status  Relation Name Status   Father  Deceased   Mother  Deceased   Brother  Alive   MGM  (Not Specified)   Neg Hx  (Not Specified)   Family History  Problem Relation Age of Onset   Lung cancer Father    Diabetes Mother    Stroke Mother    Diabetes Maternal Grandmother    Colon cancer Neg Hx    Breast cancer Neg Hx    No Known Allergies  Patient Care Team: Norma Wall as PCP - General (Physician Assistant) Norma Wall, Norma Wall (General Surgery) Norma Rana, Wall as Referring Physician (Family Medicine) Norma Daring, Wall as Physician Assistant (Family Medicine)   Medications: Outpatient Medications Prior to Visit  Medication Sig Note   acetaminophen (TYLENOL) 500 MG tablet Take 1,000 mg by mouth every 6 (six) hours as needed for moderate pain, fever or headache. 03/01/2022: PRN   azelastine (ASTELIN) 0.1 % nasal spray Place 1 spray into both nostrils 2 (two) times daily. Use in each nostril as directed 03/01/2022: PRN   ibuprofen (ADVIL)  200 MG tablet Take 400-800 mg by mouth every 6 (six) hours as needed for mild pain or moderate pain. 03/01/2022: PRN   SUMAtriptan (IMITREX) 50 MG tablet TAKE 1 TABLET BY MOUTH EVERY 2 HOURS FOR MIGRAINE, MAY REPEAT IN 2 HOURS IF HEADACHE PERSISTS OR RECURS    clonazePAM (KLONOPIN) 0.5 MG tablet TAKE 1 TABLET BY MOUTH TWICE DAILY AS NEEDED FOR ANXIETY OR PALPITATIONS (Patient not taking: Reported on 03/01/2022)    No facility-administered medications prior to visit.    Review of Systems  Constitutional:  Negative for fatigue and fever.  Respiratory:  Negative for cough and shortness of breath.   Cardiovascular:  Negative for chest pain and leg swelling.  Gastrointestinal:  Positive for abdominal pain and constipation.  Neurological:  Negative for dizziness and headaches.     Objective    BP 108/76  Pulse 91   Temp 97.7 F (36.5 C)   Wt 230 lb (104.3 kg)   LMP 09/08/2020   SpO2 98%   BMI 36.02 kg/m  Blood pressure 108/76, pulse 91, temperature 97.7 F (36.5 C), weight 230 lb (104.3 kg), last menstrual period 09/08/2020, SpO2 98 %.    Physical Exam Constitutional:      General: She is awake.     Appearance: She is well-developed. She is not ill-appearing.  HENT:     Head: Normocephalic.     Right Ear: Tympanic membrane normal.     Left Ear: Tympanic membrane normal.     Nose: Nose normal. No congestion or rhinorrhea.     Mouth/Throat:     Pharynx: No oropharyngeal exudate or posterior oropharyngeal erythema.  Eyes:     Conjunctiva/sclera: Conjunctivae normal.     Pupils: Pupils are equal, round, and reactive to light.  Neck:     Thyroid: No thyroid mass or thyromegaly.  Cardiovascular:     Rate and Rhythm: Normal rate and regular rhythm.     Heart sounds: Normal heart sounds.  Pulmonary:     Effort: Pulmonary effort is normal.     Breath sounds: Normal breath sounds.  Abdominal:     Palpations: Abdomen is soft.     Tenderness: There is abdominal tenderness in the  suprapubic area and left lower quadrant. There is no guarding or rebound.  Musculoskeletal:     Right lower leg: No swelling. No edema.     Left lower leg: No swelling. No edema.  Lymphadenopathy:     Cervical: No cervical adenopathy.  Skin:    General: Skin is warm.  Neurological:     Mental Status: She is alert and oriented to person, place, and time.  Psychiatric:        Attention and Perception: Attention normal.        Wall and Affect: Wall normal.        Speech: Speech normal.        Behavior: Behavior normal. Behavior is cooperative.     Last depression screening scores    03/01/2022    9:13 AM 02/05/2022    1:21 PM 02/27/2021    3:17 PM  PHQ 2/9 Scores  PHQ - 2 Score 0 0 0  PHQ- 9 Score  2 1   Last fall risk screening    03/01/2022    9:13 AM  [place] in the past year? 0  Number falls in past yr: 0  Injury with Fall? 0   Last Audit-C alcohol use screening    03/01/2022    9:14 AM  Alcohol Use Disorder Test (AUDIT)  1. How often do you have a drink containing alcohol? 1  2. How many drinks containing alcohol do you have on a typical day when you are drinking? 0  3. How often do you have six or more drinks on one occasion? 0  AUDIT-C Score 1   A score of 3 or more in women, and 4 or more in men indicates increased risk for alcohol abuse, EXCEPT if all of the points are from question 1   No results found for any visits on 03/01/22.  Assessment & Plan    Routine Health Maintenance and Physical Exam  Exercise Activities and Dietary recommendations --balanced diet high in fiber and protein, low in sugars, carbs, fats. --physical activity/exercise 30 minutes 3-5 times a week     Immunization History  Administered Date(s) Administered   Influenza Split 11/15/2016   Influenza,inj,Quad PF,6+ Mos 11/15/2016, 01/05/2019   PFIZER(Purple Top)SARS-COV-2 Vaccination 02/21/2019, 03/12/2019, 12/24/2019   Tdap 04/06/2010    Health Maintenance  Topic Date  Due   DTaP/Tdap/Td (2 - Td or Tdap) 04/05/2020   Zoster Vaccines- Shingrix (1 of 2) Never done   COVID-19 Vaccine (4 - 2023-24 season) 10/02/2021   INFLUENZA VACCINE  05/02/2022 (Originally 09/01/2021)   MAMMOGRAM  11/03/2022   PAP SMEAR-Modifier  07/15/2023   COLONOSCOPY (Pts 45-47yr Insurance coverage will need to be confirmed)  07/21/2025   Hepatitis C Screening  Completed   HIV Screening  Completed   HPV VACCINES  Aged Out    Discussed health benefits of physical activity, and encouraged her to engage in regular exercise appropriate for her age and condition.  Problem List Items Addressed This Visit       Other   Avitaminosis D    Repeat vit d level for stability      Relevant Orders   Vitamin D (25 hydroxy)   Hematuria    UA + blood neg leuk CT will r/o stone, culture ordered      Relevant Orders   CT Abdomen Pelvis Wo Contrast   Urine Culture   Left lower quadrant abdominal pain    H/o hysterectomy L oophorectomy, b/l salpingectomy Last colonoscopy 2022 with two polyps; no diverticulosis seen Tender on exam Colon vs ovarian etiology -- given hematuria on UA today; possibility for nephrolithiasis as well Discussed pelvic uKoreavs abd pelvic CT ; ultimately decided on CT as will demonstrate multiple possible pathologies Cbc/cmp ordered      Relevant Orders   CT Abdomen Pelvis Wo Contrast   Urine Culture   Right upper quadrant abdominal pain    H/o cholecystectomy 10/2018 Nontender on exam Advised that ultrasound could demonstrate residual sludge or stones; pt reports not a priority to investigate  Cbc/cmp /lipase ordered      Relevant Orders   Lipase   POCT Urinalysis Dipstick   Other Visit Diagnoses     Annual physical exam    -  Primary   Relevant Orders   CBC w/Diff/Platelet   Comprehensive Metabolic Panel (CMET)   Lipid Profile        Return in about 1 year (around 03/02/2023), or if symptoms worsen or fail to improve; will update with test  results, for CPE.     I, LMikey Kirschner Wall have reviewed all documentation for this visit. The documentation on 03/01/22 for the exam, diagnosis, procedures, and orders are all accurate and complete.  LMikey Kirschner Wall B[facility] [place] [place] [address]Office: [phone]: [phone]

## 2022-03-01 NOTE — Assessment & Plan Note (Signed)
Repeat vit d level for stability

## 2022-03-01 NOTE — Assessment & Plan Note (Addendum)
H/o hysterectomy L oophorectomy, b/l salpingectomy Last colonoscopy 2022 with two polyps; no diverticulosis seen Tender on exam Colon vs ovarian etiology -- given hematuria on UA today; possibility for nephrolithiasis as well Discussed pelvic US vs abd pelvic CT ; ultimately decided on CT as will demonstrate multiple possible pathologies Cbc/cmp ordered

## 2022-03-01 NOTE — Assessment & Plan Note (Signed)
UA + blood neg leuk CT will r/o stone, culture ordered

## 2022-03-02 DIAGNOSIS — I872 Venous insufficiency (chronic) (peripheral): Secondary | ICD-10-CM | POA: Diagnosis not present

## 2022-03-02 LAB — LIPID PANEL
Chol/HDL Ratio: 2.7 ratio (ref 0.0–4.4)
Cholesterol, Total: 184 mg/dL (ref 100–199)
HDL: 67 mg/dL (ref >39)
LDL Chol Calc (NIH): 101 mg/dL — ABNORMAL HIGH (ref 0–99)
Triglycerides: 91 mg/dL (ref 0–149)
VLDL Cholesterol Cal: 16 mg/dL (ref 5–40)

## 2022-03-02 LAB — COMPREHENSIVE METABOLIC PANEL
ALT: 20 IU/L (ref 0–32)
AST: 17 IU/L (ref 0–40)
Albumin/Globulin Ratio: 1.8 (ref 1.2–2.2)
Albumin: 4.2 g/dL (ref 3.8–4.9)
Alkaline Phosphatase: 49 IU/L (ref 44–121)
BUN/Creatinine Ratio: 13 (ref 9–23)
BUN: 11 mg/dL (ref 6–24)
Bilirubin Total: 0.4 mg/dL (ref 0.0–1.2)
CO2: 24 mmol/L (ref 20–29)
Calcium: 9.3 mg/dL (ref 8.7–10.2)
Chloride: 102 mmol/L (ref 96–106)
Creatinine, Ser: 0.84 mg/dL (ref 0.57–1.00)
Globulin, Total: 2.4 g/dL (ref 1.5–4.5)
Glucose: 82 mg/dL (ref 70–99)
Potassium: 4.4 mmol/L (ref 3.5–5.2)
Sodium: 138 mmol/L (ref 134–144)
Total Protein: 6.6 g/dL (ref 6.0–8.5)
eGFR: 84 mL/min/{1.73_m2} (ref >59)

## 2022-03-02 LAB — CBC WITH DIFFERENTIAL/PLATELET
Basophils Absolute: 0 10*3/uL (ref 0.0–0.2)
Basos: 1 %
EOS (ABSOLUTE): 0.1 10*3/uL (ref 0.0–0.4)
Eos: 2 %
Hematocrit: 42.6 % (ref 34.0–46.6)
Hemoglobin: 13.6 g/dL (ref 11.1–15.9)
Immature Grans (Abs): 0 10*3/uL (ref 0.0–0.1)
Immature Granulocytes: 1 %
Lymphocytes Absolute: 1.7 10*3/uL (ref 0.7–3.1)
Lymphs: 34 %
MCH: 28.2 pg (ref 26.6–33.0)
MCHC: 31.9 g/dL (ref 31.5–35.7)
MCV: 88 fL (ref 79–97)
Monocytes Absolute: 0.5 10*3/uL (ref 0.1–0.9)
Monocytes: 9 %
Neutrophils Absolute: 2.7 10*3/uL (ref 1.4–7.0)
Neutrophils: 53 %
Platelets: 186 10*3/uL (ref 150–450)
RBC: 4.82 x10E6/uL (ref 3.77–5.28)
RDW: 12.4 % (ref 11.7–15.4)
WBC: 5.1 10*3/uL (ref 3.4–10.8)

## 2022-03-02 LAB — VITAMIN D 25 HYDROXY (VIT D DEFICIENCY, FRACTURES): Vit D, 25-Hydroxy: 25.1 ng/mL — ABNORMAL LOW (ref 30.0–100.0)

## 2022-03-02 LAB — LIPASE: Lipase: 28 U/L (ref 14–72)

## 2022-03-03 LAB — URINE CULTURE: Organism ID, Bacteria: NO GROWTH

## 2022-03-03 LAB — SPECIMEN STATUS REPORT

## 2022-03-05 ENCOUNTER — Encounter: Payer: Self-pay | Admitting: Physician Assistant

## 2022-03-05 DIAGNOSIS — H5213 Myopia, bilateral: Secondary | ICD-10-CM | POA: Diagnosis not present

## 2022-03-05 DIAGNOSIS — H524 Presbyopia: Secondary | ICD-10-CM | POA: Diagnosis not present

## 2022-03-05 DIAGNOSIS — H52223 Regular astigmatism, bilateral: Secondary | ICD-10-CM | POA: Diagnosis not present

## 2022-03-09 ENCOUNTER — Ambulatory Visit
Admission: RE | Admit: 2022-03-09 | Discharge: 2022-03-09 | Disposition: A | Discharge status: Home / Self Care | Payer: Federal, State, Local not specified - PPO | Source: Ambulatory Visit | Attending: Physician Assistant | Admitting: Physician Assistant

## 2022-03-09 DIAGNOSIS — R1032 Left lower quadrant pain: Secondary | ICD-10-CM | POA: Diagnosis not present

## 2022-03-09 DIAGNOSIS — R319 Hematuria, unspecified: Secondary | ICD-10-CM | POA: Diagnosis not present

## 2022-03-09 DIAGNOSIS — I872 Venous insufficiency (chronic) (peripheral): Secondary | ICD-10-CM | POA: Diagnosis not present

## 2022-03-09 DIAGNOSIS — R109 Unspecified abdominal pain: Secondary | ICD-10-CM | POA: Diagnosis not present

## 2022-03-15 ENCOUNTER — Other Ambulatory Visit: Payer: Self-pay | Admitting: Physician Assistant

## 2022-03-15 DIAGNOSIS — R1011 Right upper quadrant pain: Secondary | ICD-10-CM

## 2022-03-18 DIAGNOSIS — H43813 Vitreous degeneration, bilateral: Secondary | ICD-10-CM | POA: Diagnosis not present

## 2022-03-18 DIAGNOSIS — H35413 Lattice degeneration of retina, bilateral: Secondary | ICD-10-CM | POA: Diagnosis not present

## 2022-04-15 DIAGNOSIS — I872 Venous insufficiency (chronic) (peripheral): Secondary | ICD-10-CM | POA: Diagnosis not present

## 2022-04-21 DIAGNOSIS — I872 Venous insufficiency (chronic) (peripheral): Secondary | ICD-10-CM | POA: Diagnosis not present

## 2022-05-11 ENCOUNTER — Ambulatory Visit: Payer: Federal, State, Local not specified - PPO | Admitting: Family Medicine

## 2022-05-11 ENCOUNTER — Encounter: Payer: Self-pay | Admitting: Family Medicine

## 2022-05-11 VITALS — BP 111/80 | HR 73 | Temp 97.6°F | Resp 16 | Wt 232.3 lb

## 2022-05-11 DIAGNOSIS — M7061 Trochanteric bursitis, right hip: Secondary | ICD-10-CM | POA: Diagnosis not present

## 2022-05-11 DIAGNOSIS — R232 Flushing: Secondary | ICD-10-CM | POA: Diagnosis not present

## 2022-05-11 DIAGNOSIS — R6882 Decreased libido: Secondary | ICD-10-CM

## 2022-05-11 DIAGNOSIS — R609 Edema, unspecified: Secondary | ICD-10-CM

## 2022-05-11 DIAGNOSIS — Z72820 Sleep deprivation: Secondary | ICD-10-CM | POA: Diagnosis not present

## 2022-05-11 MED ORDER — LIDOCAINE HCL (PF) 1 % IJ SOLN
4.0000 mL | Freq: Once | INTRAMUSCULAR | Status: AC
  Administered 2022-05-11: 4 mL via INTRADERMAL

## 2022-05-11 MED ORDER — METHYLPREDNISOLONE ACETATE 40 MG/ML IJ SUSP
40.0000 mg | Freq: Once | INTRAMUSCULAR | Status: AC
  Administered 2022-05-11: 40 mg via INTRAMUSCULAR

## 2022-05-11 NOTE — Progress Notes (Signed)
    I,Norma Wall,acting as a scribe for Shirlee Latch, MD.,have documented all relevant documentation on the behalf of Shirlee Latch, MD,as directed by  Shirlee Latch, MD while in the presence of Shirlee Latch, MD.     Established patient visit   Patient: Norma Wall   DOB: 29-Jan-1971   52 y.o. Female  MRN: [id number] Visit Date: 05/11/2022  Today's healthcare provider: Shirlee Latch, MD   No chief complaint on file.  Subjective    HPI   History of endometriosis and ovarian cysts. S/p hysterectomy. One ovary was left.  Hip pain intermittently for >1 year, worse and constant for last 3-4 months. Worse with laying down on her side (either side). Last few weeks, going down the leg to knee and calf. Tried some gentle stretches. Avoids crossing her legs as this makes it worse.  Very low libido for last few months. Husband is concerned  Was having hot flashes at night without night sweats for a week or so. Now gone again.  Had asked gyn about menopause and was told there was no cause to check  Checking in with [facility] in [place] re-possible lipedema. Uses compression devices intermittently. Playing to start aqua-exercise soon (Dr Buel Ream McHutchinson)  Patient requests PCP change - I see the rest of her family currently - will approve.   Medications: Outpatient Medications Prior to Visit  Medication Sig  . acetaminophen (TYLENOL) 500 MG tablet Take 1,000 mg by mouth every 6 (six) hours as needed for moderate pain, fever or headache.  . ibuprofen (ADVIL) 200 MG tablet Take 400-800 mg by mouth every 6 (six) hours as needed for mild pain or moderate pain.  . SUMAtriptan (IMITREX) 50 MG tablet TAKE 1 TABLET BY MOUTH EVERY 2 HOURS FOR MIGRAINE, MAY REPEAT IN 2 HOURS IF HEADACHE PERSISTS OR RECURS  . azelastine (ASTELIN) 0.1 % nasal spray Place 1 spray into both nostrils 2 (two) times daily. Use in each nostril as directed  . clonazePAM (KLONOPIN) 0.5 MG  tablet TAKE 1 TABLET BY MOUTH TWICE DAILY AS NEEDED FOR ANXIETY OR PALPITATIONS   No facility-administered medications prior to visit.    Review of Systems  Constitutional:  Positive for unexpected weight change.  Endocrine: Positive for heat intolerance.  Musculoskeletal:  Positive for arthralgias.  Psychiatric/Behavioral:  Positive for decreased concentration and sleep disturbance.     {Labs  Heme  Chem  Endocrine  Serology  Results Review (optional):23779}   Objective    BP 111/80 (BP Location: Left Arm, Patient Position: Sitting, Cuff Size: Large)   Pulse 73   Temp 97.6 F (36.4 C) (Temporal)   Resp 16   Wt 232 lb 4.8 oz (105.4 kg)   LMP 09/08/2020   BMI 36.38 kg/m  {Show previous vital signs (optional):23777}  Physical Exam  ***  No results found for any visits on 05/11/22.  Assessment & Plan     Problem List Items Addressed This Visit   None    No follow-ups on file.      I, Shirlee Latch, MD, have reviewed all documentation for this visit. The documentation on 05/11/22 for the exam, diagnosis, procedures, and orders are all accurate and complete.   Smantha Boakye, Marzella Schlein, MD, MPH [facility]

## 2022-05-12 LAB — FSH/LH
FSH: 47.5 m[IU]/mL
LH: 49.4 m[IU]/mL

## 2022-05-13 ENCOUNTER — Encounter: Payer: Self-pay | Admitting: Family Medicine

## 2022-05-13 ENCOUNTER — Other Ambulatory Visit: Payer: Self-pay | Admitting: Physician Assistant

## 2022-05-13 DIAGNOSIS — R232 Flushing: Secondary | ICD-10-CM | POA: Insufficient documentation

## 2022-05-13 DIAGNOSIS — R6882 Decreased libido: Secondary | ICD-10-CM | POA: Insufficient documentation

## 2022-05-13 DIAGNOSIS — G43709 Chronic migraine without aura, not intractable, without status migrainosus: Secondary | ICD-10-CM

## 2022-05-13 NOTE — Assessment & Plan Note (Signed)
Chronic and stable Continue compression device-consider using this daily rather than as needed Is going to reestablish with a lipidemia specialist in interim

## 2022-05-13 NOTE — Assessment & Plan Note (Signed)
>>  ASSESSMENT AND PLAN FOR BURSITIS WRITTEN ON 05/13/2022 11:42 AM BY Erasmo Downer, MD  New problem History and exam are consistent with greater trochanteric bursitis of the right hip Discussed stretching/home exercise program with patient Ice after activity Corticosteroid injection today  INJECTION: Patient was given informed consent,. Appropriate time out was taken. Area prepped and draped in usual sterile fashion.  1 cc of depo-medrol 40 mg/ml plus 4 cc of 1% lidocaine without epinephrine was injected into the right greater trochanteric bursa using a(n) lateral approach. The patient tolerated the procedure well. There were no complications. Post procedure instructions were given.

## 2022-05-13 NOTE — Assessment & Plan Note (Addendum)
Possibly 2/2 menopause or perimenopause Cannot use menstrual cycle to determine menopausal status as she is s/p hysterectomy Check FSH/LH to determine menopausal status May be interested in considering hormone replacement therapy  We discussed WHI study findings in detail.  In the combined estrogen-progesterone arm breast cancer risk was increased by 1.26 (CI of 1.00 to 1.59), coronary heart disease 1.29 (CI 1.02-1.63), stroke risk 1.41 (1.07-1.85), and pulmonary embolism 2.13 (CI 1.39-3.25).  That being said the while statistically significant the actual number of cases attributable are relatively small at an addition 8 cases of breast cancer, 7 more coronary artery event, 8 more strokes, and 8 additional case of pulmonary embolism per 10,000 women.  Study was terminated because of the increased breast cancer risk, this was not seen in the progestin only arm of the study for women without an intact uterus.  In addition it is important to note that HRT also had positive or risk reducing effects, and all cause mortality between the HRT/non-HRT users is not statistically different.  Estrogen-progestin HRT decreased the relative risk of hip fracture 0.66 (CI 0.45-0.98), colorectal cancer 0.63 (0.43-0.92).  Current consensus is to limit dose to the lowest effective dose, and shortest treatment duration possible.  Breast cancer risk appeared to increase after 4 years of use.  Also important to note is that these risk refer to systemic HRT for the treatment of vasomotor symptoms, and do not apply to vaginal preperations with minimal systemic absorption and aimed at treating symptoms of vulvovaginal atrophy.    We briefly touched on findings of WHIMS trial published in 2005 which looked at women 33 year of age or older, and whether HRT was protective against the development of dementia.  The study revealed that HRT actually increased the risk for the development of dementia but was limited by looking only at  patients 62 years of age and older.  The subsequent KEEPS trial  In 2012 which looked at HRT in recently postmenopausal women did not show any improvement in cognitive function for women on HRT.  However, there was also no significant cognitive declines seen in recently postmenopausal women receiving HRT as had previously been shown in the [facility] trial.

## 2022-05-13 NOTE — Assessment & Plan Note (Signed)
Associated with hot flashes and poor sleep Likely related to perimenopause/menopause as above Plan as above for hot flashes, pending FSH/LH, consider HRT

## 2022-05-13 NOTE — Assessment & Plan Note (Signed)
New problem History and exam are consistent with greater trochanteric bursitis of the right hip Discussed stretching/home exercise program with patient Ice after activity Corticosteroid injection today  INJECTION: Patient was given informed consent,. Appropriate time out was taken. Area prepped and draped in usual sterile fashion.  1 cc of depo-medrol 40 mg/ml plus 4 cc of 1% lidocaine without epinephrine was injected into the right greater trochanteric bursa using a(n) lateral approach. The patient tolerated the procedure well. There were no complications. Post procedure instructions were given.

## 2022-06-07 DIAGNOSIS — Z85828 Personal history of other malignant neoplasm of skin: Secondary | ICD-10-CM | POA: Diagnosis not present

## 2022-06-07 DIAGNOSIS — Z86018 Personal history of other benign neoplasm: Secondary | ICD-10-CM | POA: Diagnosis not present

## 2022-06-07 DIAGNOSIS — L578 Other skin changes due to chronic exposure to nonionizing radiation: Secondary | ICD-10-CM | POA: Diagnosis not present

## 2022-06-14 ENCOUNTER — Encounter: Payer: Self-pay | Admitting: Family Medicine

## 2022-06-14 DIAGNOSIS — M7061 Trochanteric bursitis, right hip: Secondary | ICD-10-CM

## 2022-06-15 ENCOUNTER — Telehealth: Payer: Federal, State, Local not specified - PPO | Admitting: Family Medicine

## 2022-06-15 NOTE — Telephone Encounter (Signed)
Ok to send referral to Sports med - [facility] in [facility] for hip pain

## 2022-06-29 ENCOUNTER — Ambulatory Visit: Payer: Federal, State, Local not specified - PPO | Admitting: Family Medicine

## 2022-06-29 ENCOUNTER — Encounter: Payer: Self-pay | Admitting: Family Medicine

## 2022-06-29 ENCOUNTER — Ambulatory Visit
Admission: RE | Admit: 2022-06-29 | Discharge: 2022-06-29 | Disposition: A | Discharge status: Home / Self Care | Payer: Federal, State, Local not specified - PPO | Attending: Family Medicine | Admitting: Family Medicine

## 2022-06-29 ENCOUNTER — Ambulatory Visit
Admission: RE | Admit: 2022-06-29 | Discharge: 2022-06-29 | Disposition: A | Discharge status: Home / Self Care | Payer: Federal, State, Local not specified - PPO | Source: Ambulatory Visit | Attending: Family Medicine | Admitting: Family Medicine

## 2022-06-29 VITALS — BP 112/78 | HR 96 | Ht 67.0 in | Wt 238.0 lb

## 2022-06-29 DIAGNOSIS — M4727 Other spondylosis with radiculopathy, lumbosacral region: Secondary | ICD-10-CM | POA: Insufficient documentation

## 2022-06-29 DIAGNOSIS — M25551 Pain in right hip: Secondary | ICD-10-CM | POA: Diagnosis not present

## 2022-06-29 DIAGNOSIS — M545 Low back pain, unspecified: Secondary | ICD-10-CM | POA: Diagnosis not present

## 2022-06-29 DIAGNOSIS — M5410 Radiculopathy, site unspecified: Secondary | ICD-10-CM

## 2022-06-29 MED ORDER — MELOXICAM 15 MG PO TABS
ORAL_TABLET | ORAL | 0 refills | Status: AC
Start: 2022-06-29 — End: 2022-07-29

## 2022-06-29 MED ORDER — CYCLOBENZAPRINE HCL 5 MG PO TABS
5.0000 mg | ORAL_TABLET | Freq: Every evening | ORAL | 0 refills | Status: DC | PRN
Start: 2022-06-29 — End: 2022-08-17

## 2022-07-01 NOTE — Progress Notes (Signed)
Primary Care / Sports Medicine Office Visit  Patient Information:  Patient ID: Norma Wall, female DOB: 1970-11-12 Age: 52 y.o. MRN: [id number]   Norma Wall is a pleasant 52 y.o. female presenting with the following:  Chief Complaint  Patient presents with   Hip Pain    Had steroid shot in right hip, thinks it is lower back     Vitals:   06/29/22 1536  BP: 112/78  Pulse: 96  SpO2: 98%   Vitals:   06/29/22 1536  Weight: 238 lb (108 kg)  Height: 5\' 7"  (1.702 m)   Body mass index is 37.28 kg/m.     Independent interpretation of notes and tests performed by another provider:   None  Procedures performed:   None  Pertinent History, Exam, Impression, and Recommendations:   Norma Wall was seen today for hip pain.  Arthralgia of right hip Assessment & Plan: Presents in consultation from PCP Dr. Shirlee Latch regarding primarily right hip pain, worst at night when laying on either side, crossing legs, ongoing for greater than 1 year, progressive worsening over the past few months, atraumatic in onset.  Has noted recent radiation of symptoms distal to the right knee.  Of note, did receive right greater trochanteric corticosteroid injection without significant change in her clinical picture.  Examination significant for positive FADIR which she endorses closely approximating her presenting pain, tenderness at the greater trochanteric region, equivocal Kemps test, otherwise tightness noted throughout the lumbosacral and hips bilaterally.  There is a negative straight leg raise.  Clinical history and findings do localized to the right hip joint however her stated history includes lumbosacral etiology given right-sided radicular features.  Can consider one of the 2 foci to be primary with additional secondary/compensatory findings inclusive of greater trochanteric region.  Plan as follows: - Dedicated x-rays of right hip and lumbar spine - Scheduled  meloxicam daily x 2 weeks then as needed - As needed nightly cyclobenzaprine - Follow-up in 6 weeks - Pending x-rays and response can consider local injections, formal PT, modification medications, advanced imaging  Orders: -     DG HIP UNILAT W OR W/O PELVIS 2-3 VIEWS RIGHT; Future -     Meloxicam; Take 1 tablet (15 mg total) by mouth daily for 14 days, THEN 1 tablet (15 mg total) daily as needed for up to 16 days for pain.  Dispense: 30 tablet; Refill: 0 -     Cyclobenzaprine HCl; Take 1-2 tablets (5-10 mg total) by mouth at bedtime as needed for muscle spasms.  Dispense: 60 tablet; Refill: 0  Greater trochanteric pain syndrome of right lower extremity -     DG HIP UNILAT W OR W/O PELVIS 2-3 VIEWS RIGHT; Future -     Meloxicam; Take 1 tablet (15 mg total) by mouth daily for 14 days, THEN 1 tablet (15 mg total) daily as needed for up to 16 days for pain.  Dispense: 30 tablet; Refill: 0 -     Cyclobenzaprine HCl; Take 1-2 tablets (5-10 mg total) by mouth at bedtime as needed for muscle spasms.  Dispense: 60 tablet; Refill: 0  Radicular syndrome of right leg -     DG Lumbar Spine Complete; Future -     Meloxicam; Take 1 tablet (15 mg total) by mouth daily for 14 days, THEN 1 tablet (15 mg total) daily as needed for up to 16 days for pain.  Dispense: 30 tablet; Refill: 0 -     Cyclobenzaprine  HCl; Take 1-2 tablets (5-10 mg total) by mouth at bedtime as needed for muscle spasms.  Dispense: 60 tablet; Refill: 0     Orders & Medications Meds ordered this encounter  Medications   meloxicam (MOBIC) 15 MG tablet    Sig: Take 1 tablet (15 mg total) by mouth daily for 14 days, THEN 1 tablet (15 mg total) daily as needed for up to 16 days for pain.    Dispense:  30 tablet    Refill:  0   cyclobenzaprine (FLEXERIL) 5 MG tablet    Sig: Take 1-2 tablets (5-10 mg total) by mouth at bedtime as needed for muscle spasms.    Dispense:  60 tablet    Refill:  0   Orders Placed This Encounter   Procedures   DG Lumbar Spine Complete   DG Hip Unilat W OR W/O Pelvis 2-3 Views Right     No follow-ups on file.     Jerrol Banana, MD, [facility]   Primary Care Sports Medicine Primary Care and Sports Medicine at [facility]

## 2022-07-01 NOTE — Patient Instructions (Signed)
-   Dedicated x-rays of right hip and lumbar spine - Scheduled meloxicam daily x 2 weeks then as needed - As needed nightly cyclobenzaprine - Follow-up in 6 weeks

## 2022-07-01 NOTE — Assessment & Plan Note (Signed)
Presents in consultation from PCP Dr. Shirlee Latch regarding primarily right hip pain, worst at night when laying on either side, crossing legs, ongoing for greater than 1 year, progressive worsening over the past few months, atraumatic in onset.  Has noted recent radiation of symptoms distal to the right knee.  Of note, did receive right greater trochanteric corticosteroid injection without significant change in her clinical picture.  Examination significant for positive FADIR which she endorses closely approximating her presenting pain, tenderness at the greater trochanteric region, equivocal Kemps test, otherwise tightness noted throughout the lumbosacral and hips bilaterally.  There is a negative straight leg raise.  Clinical history and findings do localized to the right hip joint however her stated history includes lumbosacral etiology given right-sided radicular features.  Can consider one of the 2 foci to be primary with additional secondary/compensatory findings inclusive of greater trochanteric region.  Plan as follows: - Dedicated x-rays of right hip and lumbar spine - Scheduled meloxicam daily x 2 weeks then as needed - As needed nightly cyclobenzaprine - Follow-up in 6 weeks - Pending x-rays and response can consider local injections, formal PT, modification medications, advanced imaging

## 2022-07-06 ENCOUNTER — Ambulatory Visit: Payer: Federal, State, Local not specified - PPO | Admitting: Family Medicine

## 2022-07-13 ENCOUNTER — Ambulatory Visit: Payer: Federal, State, Local not specified - PPO | Admitting: Gastroenterology

## 2022-07-15 ENCOUNTER — Encounter: Payer: Self-pay | Admitting: Family Medicine

## 2022-08-03 ENCOUNTER — Ambulatory Visit: Payer: Federal, State, Local not specified - PPO | Admitting: Family Medicine

## 2022-08-03 VITALS — BP 115/85 | HR 80 | Temp 97.7°F | Ht 67.0 in | Wt 236.0 lb

## 2022-08-03 DIAGNOSIS — R609 Edema, unspecified: Secondary | ICD-10-CM | POA: Diagnosis not present

## 2022-08-03 DIAGNOSIS — M5410 Radiculopathy, site unspecified: Secondary | ICD-10-CM

## 2022-08-03 DIAGNOSIS — R232 Flushing: Secondary | ICD-10-CM

## 2022-08-03 NOTE — Progress Notes (Signed)
Established Patient Office Visit  Subjective   Patient ID: Norma Wall, female    DOB: Jun 19, 1970  Age: 52 y.o. MRN: [id number]  No chief complaint on file.   HPI  Discussed the use of AI scribe software for clinical note transcription with the patient, who gave verbal consent to proceed.  History of Present Illness   The patient, with a history of hip bursitis, lipedema, and menopause, presents with multiple concerns. The patient reports that the hip bursitis has slightly improved, but the pain has started radiating down the leg, which was not previously experienced. The patient has been trying various treatments, including seeing an orthopedic specialist, acupuncture, chiropractic care, and using a massage gun. However, the patient reports that these treatments have not significantly improved the condition.  The patient also reports experiencing hot flashes, but they are not severe. The patient has only had three hot flashes since the last visit and they are not full-blown hot flashes. The patient is interested in discussing hormone testing and potential hormone replacement therapy.  The patient's lipedema is being managed by a specialist in [place]. The patient reports that the specialist recommended treating chronic venous insufficiency before addressing the lipedema. The patient has been trying to manage the condition by swimming and eating healthier. However, the patient is having difficulty finding suitable compression stockings.  The patient also reports sleep disturbances, primarily due to having to wake up multiple times at night due to hip pain. The patient expresses frustration with the lack of sleep and its impact on daily life.         ROS    Objective:     BP 115/85 (BP Location: Left Arm, Patient Position: Sitting, Cuff Size: Normal)   Pulse 80   Temp 97.7 F (36.5 C) (Oral)   Ht 5\' 7"  (1.702 m)   Wt 236 lb (107 kg)   LMP 09/08/2020   SpO2 100%   BMI  36.96 kg/m    Physical Exam Vitals reviewed.  Constitutional:      General: She is not in acute distress.    Appearance: She is well-developed.  HENT:     Head: Normocephalic and atraumatic.  Eyes:     General: No scleral icterus.    Conjunctiva/sclera: Conjunctivae normal.  Cardiovascular:     Rate and Rhythm: Normal rate and regular rhythm.  Pulmonary:     Effort: Pulmonary effort is normal. No respiratory distress.  Skin:    General: Skin is warm and dry.     Findings: No rash.  Neurological:     Mental Status: She is alert and oriented to person, place, and time.  Psychiatric:        Behavior: Behavior normal.      No results found for any visits on 08/03/22.    The 10-year ASCVD risk score (Arnett DK, et al., 2019) is: 0.8%    Assessment & Plan:   Problem List Items Addressed This Visit       Cardiovascular and Mediastinum   Hot flashes - Primary     Nervous and Auditory   Radicular syndrome of right leg     Musculoskeletal and Integument   Lipedema        Radiculopathy: Initially diagnosed as hip bursitis, but orthopedic specialist identified as radiculopathy. Patient has tried NSAIDs, muscle relaxants, chiropractic treatment, acupuncture, and massage gun with limited success. Symptoms are affecting sleep and daily activities. -Consider referral to physical therapy. -Discuss potential for injections in the  back or PT with Dr. Ashley Royalty at upcoming appointment.  Menopause: Experiencing mild hot flashes. Patient is interested in exploring supplements for hormone replacement. -Review and discuss [place] hormone testing results and potential supplements. -Consider calcium and vitamin D supplementation for bone health.  Lipedema: Undergoing treatment with a specialist in [place]. Currently addressing chronic venous insufficiency before treating lipedema. Patient is swimming and eating healthier, which seems to be helping. Difficulty finding comfortable  compression stockings. -Discuss options for different types of compression stockings.  Sleep Disturbance: Likely related to radiculopathy. Patient tried grounding sheet with no improvement. -Consider implementing "three good things" technique at bedtime for anxiety management and improved sleep quality.  General Health Maintenance: -Check Vitamin D levels at next appointment. Continue Vitamin D supplementation if levels remain low.        Return in about 6 months (around 02/03/2023) for CPE.    Shirlee Latch, MD

## 2022-08-17 ENCOUNTER — Encounter: Payer: Self-pay | Admitting: Family Medicine

## 2022-08-17 ENCOUNTER — Ambulatory Visit: Payer: Federal, State, Local not specified - PPO | Admitting: Family Medicine

## 2022-08-17 VITALS — BP 120/78 | HR 80 | Ht 67.0 in | Wt 240.0 lb

## 2022-08-17 DIAGNOSIS — M25551 Pain in right hip: Secondary | ICD-10-CM

## 2022-08-17 DIAGNOSIS — M4727 Other spondylosis with radiculopathy, lumbosacral region: Secondary | ICD-10-CM

## 2022-08-17 MED ORDER — GABAPENTIN 300 MG PO CAPS
300.0000 mg | ORAL_CAPSULE | Freq: Every day | ORAL | 1 refills | Status: DC
Start: 2022-08-17 — End: 2022-09-07

## 2022-08-17 MED ORDER — PREDNISONE 50 MG PO TABS: 50.0000 mg | ORAL_TABLET | Freq: Every day | ORAL | 0 refills | Status: DC

## 2022-08-17 MED ORDER — DICLOFENAC SODIUM 75 MG PO TBEC: 75.0000 mg | DELAYED_RELEASE_TABLET | Freq: Two times a day (BID) | ORAL | 1 refills | Status: DC

## 2022-08-17 NOTE — Assessment & Plan Note (Signed)
Patient returns for follow-up, has noted some interval improvement at the hips, residual minor pain anterolaterally, primary symptoms are throughout the entirety of the right leg, worst at night, interfering with ADLs.  Was compliant with medications as prescribed.  Examination today with equivocal right straight leg raise, now negative FADIR, equivocal iliopsoas testing, positive Faber, negative piriformis, minor tenderness about the greater trochanteric region, nontender about the SI region, gluteals, paraspinal lumbar musculature.  Patient's clinical history demonstrate interval improvement though show primary focality to the lumbosacral spine where degenerative changes are noted on x-ray and she has radicular features of the L5-S1 distribution.  Plan as follows: - MRI lumbar spine without contrast to further evaluate persistent radicular features - Referral to interventional spine - 7-day course of prednisone - Following prednisone, start diclofenac twice daily (take with food) - Nightly gabapentin 300 mg, side effect can be drowsiness - [facility] spine conditioning program, focus on gentle advance - Will reach out to patient once MRI results are available to obtain a status update and to discuss next steps accordingly

## 2022-08-17 NOTE — Patient Instructions (Signed)
-   MRI lumbar spine without contrast to further evaluate persistent radicular features - Referral to interventional spine - 7-day course of prednisone - Following prednisone, start diclofenac twice daily (take with food) - Nightly gabapentin 300 mg, side effect can be drowsiness - [facility] spine conditioning program, focus on gentle advance

## 2022-08-17 NOTE — Progress Notes (Signed)
Primary Care / Sports Medicine Office Visit  Patient Information:  Patient ID: Norma Wall, female DOB: 23-Jul-1970 Age: 52 y.o. MRN: [id number]   Norma Wall is a pleasant 52 y.o. female presenting with the following:  Chief Complaint  Patient presents with   Leg Pain    Is worst, hips are better    Vitals:   08/17/22 1547  BP: 120/78  Pulse: 80  SpO2: 98%   Vitals:   08/17/22 1547  Weight: 240 lb (108.9 kg)  Height: 5\' 7"  (1.702 m)   Body mass index is 37.59 kg/m.  No results found.   Independent interpretation of notes and tests performed by another provider:   Independent interpretation of recent lumbar spine and right hip/AP pelvis x-rays demonstrates multilevel degenerative changes with focality about the L2-3 and L3-4 where there is anterior endplate osteophyte formation, asymmetric degenerative changes noted on AP view, L5-S1 facet hypertrophy, hip and pelvis x-rays reassuring with no joint space loss, subtle cortical roughening at the inferior aspect of the inferior pubic rami bilaterally  Procedures performed:   None  Pertinent History, Exam, Impression, and Recommendations:   [name] was seen today for leg pain.  Multilevel lumbosacral spondylosis with radiculopathy Assessment & Plan: Patient returns for follow-up, has noted some interval improvement at the hips, residual minor pain anterolaterally, primary symptoms are throughout the entirety of the right leg, worst at night, interfering with ADLs.  Was compliant with medications as prescribed.  Examination today with equivocal right straight leg raise, now negative FADIR, equivocal iliopsoas testing, positive Faber, negative piriformis, minor tenderness about the greater trochanteric region, nontender about the SI region, gluteals, paraspinal lumbar musculature.  Patient's clinical history demonstrate interval improvement though show primary focality to the lumbosacral spine where  degenerative changes are noted on x-ray and she has radicular features of the L5-S1 distribution.  Plan as follows: - MRI lumbar spine without contrast to further evaluate persistent radicular features - Referral to interventional spine - 7-day course of prednisone - Following prednisone, start diclofenac twice daily (take with food) - Nightly gabapentin 300 mg, side effect can be drowsiness - [facility] spine conditioning program, focus on gentle advance - Will reach out to patient once MRI results are available to obtain a status update and to discuss next steps accordingly  Orders: -     Gabapentin; Take 1 capsule (300 mg total) by mouth at bedtime.  Dispense: 30 capsule; Refill: 1 -     Diclofenac Sodium; Take 1 tablet (75 mg total) by mouth 2 (two) times daily.  Dispense: 60 tablet; Refill: 1 -     MR LUMBAR SPINE WO CONTRAST; Future -     predniSONE; Take 1 tablet (50 mg total) by mouth daily with breakfast.  Dispense: 7 tablet; Refill: 0  Arthralgia of right hip Assessment & Plan: Interim improved and essentially asymptomatic   Greater trochanteric pain syndrome of right lower extremity Assessment & Plan: Interval improved, minimally symptomatic, given other oral medication regimen, will defer from local injection at this time.      Orders & Medications Meds ordered this encounter  Medications   gabapentin (NEURONTIN) 300 MG capsule    Sig: Take 1 capsule (300 mg total) by mouth at bedtime.    Dispense:  30 capsule    Refill:  1   diclofenac (VOLTAREN) 75 MG EC tablet    Sig: Take 1 tablet (75 mg total) by mouth 2 (two) times daily.  Dispense:  60 tablet    Refill:  1   predniSONE (DELTASONE) 50 MG tablet    Sig: Take 1 tablet (50 mg total) by mouth daily with breakfast.    Dispense:  7 tablet    Refill:  0   Orders Placed This Encounter  Procedures   MR Lumbar Spine Wo Contrast     No follow-ups on file.     Jerrol Banana, MD, [facility]   Primary Care Sports  Medicine Primary Care and Sports Medicine at [facility]

## 2022-08-17 NOTE — Assessment & Plan Note (Signed)
Interval improved, minimally symptomatic, given other oral medication regimen, will defer from local injection at this time.

## 2022-08-17 NOTE — Assessment & Plan Note (Signed)
Interim improved and essentially asymptomatic

## 2022-08-18 ENCOUNTER — Encounter: Payer: Self-pay | Admitting: Family Medicine

## 2022-08-19 NOTE — Telephone Encounter (Signed)
Please advise 

## 2022-08-20 ENCOUNTER — Other Ambulatory Visit: Payer: Self-pay | Admitting: Family Medicine

## 2022-08-20 DIAGNOSIS — M25551 Pain in right hip: Secondary | ICD-10-CM

## 2022-08-24 DIAGNOSIS — I872 Venous insufficiency (chronic) (peripheral): Secondary | ICD-10-CM | POA: Diagnosis not present

## 2022-08-25 ENCOUNTER — Ambulatory Visit
Admission: RE | Admit: 2022-08-25 | Discharge: 2022-08-25 | Disposition: A | Discharge status: Home / Self Care | Payer: Federal, State, Local not specified - PPO | Source: Ambulatory Visit | Attending: Family Medicine | Admitting: Family Medicine

## 2022-08-25 DIAGNOSIS — M5116 Intervertebral disc disorders with radiculopathy, lumbar region: Secondary | ICD-10-CM | POA: Diagnosis not present

## 2022-08-25 DIAGNOSIS — M4727 Other spondylosis with radiculopathy, lumbosacral region: Secondary | ICD-10-CM | POA: Diagnosis not present

## 2022-08-25 DIAGNOSIS — M25551 Pain in right hip: Secondary | ICD-10-CM | POA: Insufficient documentation

## 2022-08-25 DIAGNOSIS — M48061 Spinal stenosis, lumbar region without neurogenic claudication: Secondary | ICD-10-CM | POA: Diagnosis not present

## 2022-08-25 DIAGNOSIS — M1611 Unilateral primary osteoarthritis, right hip: Secondary | ICD-10-CM | POA: Diagnosis not present

## 2022-08-25 DIAGNOSIS — M79604 Pain in right leg: Secondary | ICD-10-CM | POA: Diagnosis not present

## 2022-09-06 NOTE — Progress Notes (Signed)
Please schedule pt an appt.  KP

## 2022-09-06 NOTE — Progress Notes (Signed)
Appointment for 09/07/22 pt request.

## 2022-09-07 ENCOUNTER — Telehealth (INDEPENDENT_AMBULATORY_CARE_PROVIDER_SITE_OTHER): Payer: Federal, State, Local not specified - PPO | Admitting: Family Medicine

## 2022-09-07 ENCOUNTER — Encounter: Payer: Self-pay | Admitting: Family Medicine

## 2022-09-07 DIAGNOSIS — M4727 Other spondylosis with radiculopathy, lumbosacral region: Secondary | ICD-10-CM | POA: Diagnosis not present

## 2022-09-07 DIAGNOSIS — M25551 Pain in right hip: Secondary | ICD-10-CM | POA: Diagnosis not present

## 2022-09-07 MED ORDER — DICLOFENAC SODIUM 75 MG PO TBEC
75.0000 mg | DELAYED_RELEASE_TABLET | Freq: Two times a day (BID) | ORAL | 1 refills | Status: DC
Start: 2022-09-07 — End: 2022-09-07

## 2022-09-07 MED ORDER — DICLOFENAC SODIUM 75 MG PO TBEC
75.0000 mg | DELAYED_RELEASE_TABLET | Freq: Two times a day (BID) | ORAL | 1 refills | Status: DC | PRN
Start: 2022-09-07 — End: 2023-02-08

## 2022-09-07 MED ORDER — GABAPENTIN 300 MG PO CAPS
300.0000 mg | ORAL_CAPSULE | Freq: Every day | ORAL | 1 refills | Status: DC
Start: 2022-09-07 — End: 2022-09-07

## 2022-09-07 MED ORDER — GABAPENTIN 300 MG PO CAPS
300.0000 mg | ORAL_CAPSULE | Freq: Every evening | ORAL | 1 refills | Status: DC | PRN
Start: 2022-09-07 — End: 2023-02-08

## 2022-09-07 NOTE — Assessment & Plan Note (Signed)
Patient presents for follow-up to chronic lumbosacral pain and right hip pain, had interim MRI studies of these areas.  We extensively reviewed the report and clinical features.  She states that she had been doing relatively to the point of discontinuing gabapentin and diclofenac.  Unfortunately noted gradual recurrence and significant worsening over the past day, somewhat controlled with a restart of medications.  MRI studies demonstrate primarily lumbosacral etiology with multilevel degenerative changes and varying degrees of stenosis, we discussed both surgical and nonsurgical treatment strategies.  Patient's questions were answered at length.  Plan as follows: - Continue gabapentin and diclofenac on an as-needed basis - Referral coordinator will contact you to schedule visits with physical therapy and interventional spine - Contact us for any question/concerns and follow-up as needed - After evaluation by interventional spine group, can consider adjunct procedures such as greater trochanter, iliopsoas bursa, and SI joint injections as clinically guided

## 2022-09-07 NOTE — Patient Instructions (Signed)
-   Continue gabapentin and diclofenac on an as-needed basis - Referral coordinator will contact you to schedule visits with physical therapy and interventional spine - Contact us for any question/concerns and follow-up as needed

## 2022-09-07 NOTE — Progress Notes (Signed)
Primary Care / Sports Medicine Virtual Visit  Patient Information:  Patient ID: Norma Wall, female DOB: 1970/11/03 Age: 52 y.o. MRN: [id number]   Norma Wall is a pleasant 52 y.o. female presenting with the following:  Chief Complaint  Patient presents with   image review    Steriods helped a lot    Review of Systems: No fevers, chills, night sweats, weight loss, chest pain, or shortness of breath.   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   Lipedema 01/10/2019   Endometriosis determined by laparoscopy 07/06/2018   Dermatitis, eczematoid 11/20/2014   External hemorrhoid 11/20/2014   Greater trochanteric pain syndrome of right lower extremity 11/20/2014   Migraine 08/28/2014   Gallbladder polyp 09/13/2012   Avitaminosis D 05/19/2009   Past Medical History:  Diagnosis Date   Abdominal pain, right upper quadrant 2013   Anxiety    Cancer (HCC)    basal cells- [place]   COVID-19 2021   Endometriosis 01/2013   Gallbladder polyp    Lipidemia 2020   Lymphedema 01/2019   Migraine headache    3-4x/yr   Ovarian cyst 2014   right ovary   PONV (postoperative nausea and vomiting)    Vertigo    resolves quickly   Outpatient Encounter Medications as of 09/07/2022  Medication Sig Note   acetaminophen (TYLENOL) 500 MG tablet Take 1,000 mg by mouth every 6 (six) hours as needed for moderate pain, fever or headache. 03/01/2022: PRN   SUMAtriptan (IMITREX) 50 MG tablet TAKE 1 TABLET BY MOUTH EVERY 2 HOURS FOR MIGRAINE MAY REPEAT IN 2 HOURS  IF HEADACHE PERSISTS OR RECURS    [DISCONTINUED] diclofenac (VOLTAREN) 75 MG EC tablet Take 1 tablet (75 mg total) by mouth 2 (two) times daily.    [DISCONTINUED] gabapentin (NEURONTIN) 300 MG capsule Take 1 capsule (300 mg total) by mouth at bedtime.    [DISCONTINUED] predniSONE (DELTASONE) 50 MG tablet Take 1 tablet (50 mg total) by mouth daily with breakfast.    diclofenac (VOLTAREN) 75 MG EC tablet Take 1 tablet (75 mg total) by mouth 2 (two) times daily as needed.    gabapentin (NEURONTIN) 300 MG capsule Take 1 capsule (300 mg total) by mouth at bedtime as needed.    [DISCONTINUED] diclofenac (VOLTAREN) 75 MG EC tablet Take 1 tablet (75 mg total) by mouth 2 (two) times daily.    [DISCONTINUED] gabapentin (NEURONTIN) 300 MG capsule Take 1 capsule (300 mg total) by mouth at bedtime.    No facility-administered encounter medications on file as of 09/07/2022.   Past Surgical History:  Procedure Laterality Date   ABLATION  2012   cyst removal   CHOLECYSTECTOMY N/A 10/19/2018   Procedure: LAPAROSCOPIC CHOLECYSTECTOMY;  Surgeon: Leafy Ro, MD;  Location: [facility];  Service: General;  Laterality: N/A;   COLONOSCOPY WITH PROPOFOL N/A 07/21/2020   Procedure: COLONOSCOPY WITH PROPOFOL;  Surgeon: Midge Minium, MD;  Location[facility];  Service: Endoscopy;  Laterality: N/A;   CYSTOSCOPY N/A 09/25/2020   Procedure: CYSTOSCOPY;  Surgeon: Bonney Aid,  Carmel Sacramento, MD;  Location: [facility];  Service: Gynecology;  Laterality: N/A;   CYSTOSCOPY W/ URETERAL STENT PLACEMENT Right 09/25/2020   Procedure: CYSTOSCOPY WITH RETROGRADE PYELOGRAM/URETERAL STENT PLACEMENT;  Surgeon: Sondra Come, MD;  Location: [facility];  Service: Urology;  Laterality: Right;   LAPAROSCOPIC OVARIAN CYSTECTOMY Right 10/19/2018   Procedure: LAPAROSCOPIC RIGHT OVARIAN CYSTECTOMY;  Surgeon: Vena Austria, MD;  Location: [facility];  Service: Gynecology;  Laterality: Right;   NECK SURGERY  02/22/2018   Artificial Disc  replacement    OVARIAN CYST REMOVAL     POLYPECTOMY N/A 07/21/2020   Procedure: POLYPECTOMY;  Surgeon: Midge Minium, MD;  Location: [facility];  Service: Endoscopy;  Laterality: N/A;   TOTAL LAPAROSCOPIC HYSTERECTOMY WITH SALPINGECTOMY Bilateral 09/25/2020   Procedure: TOTAL LAPAROSCOPIC HYSTERECTOMY WITH SALPINGECTOMY;  Surgeon: Vena Austria, MD;  Location: [facility];  Service: Gynecology;  Laterality: Bilateral;    Virtual Visit via MyChart Video:   I connected with Norma Wall on 09/07/22 via MyChart Video and verified that I am speaking with the correct person using appropriate identifiers.   The limitations, risks, security and privacy concerns of performing an evaluation and management service by MyChart Video, including the higher likelihood of inaccurate diagnoses and treatments, and the availability of in person appointments were reviewed. The possible need of an additional face-to-face encounter for complete and high quality delivery of care was discussed. The patient was also made aware that there may be a patient responsible charge related to this service. The patient expressed understanding and wishes to proceed.  Provider location is in medical facility. Patient location is at their home, different from provider location. People involved in care of the patient during this telehealth encounter were myself, my nurse/medical assistant, and my front office/scheduling team member.  Objective findings:   General: Speaking full sentences, no audible heavy breathing. Sounds alert and appropriately interactive. Well-appearing. Face symmetric. Extraocular movements intact. Pupils equal and round. No nasal flaring or accessory muscle use visualized.  Independent interpretation of notes and tests performed by another provider:   Independent interpretation of lumbar spine and right hip MRI conducted with patient.  Pertinent History, Exam, Impression, and  Recommendations:   Multilevel lumbosacral spondylosis with radiculopathy Patient presents for follow-up to chronic lumbosacral pain and right hip pain, had interim MRI studies of these areas.  We extensively reviewed the report and clinical features.  She states that she had been doing relatively to the point of discontinuing gabapentin and diclofenac.  Unfortunately noted gradual recurrence and significant worsening over the past day, somewhat controlled with a restart of medications.  MRI studies demonstrate primarily lumbosacral etiology with multilevel degenerative changes and varying degrees of stenosis, we discussed both surgical and nonsurgical treatment strategies.  Patient's questions were answered at length.  Plan as follows: - Continue gabapentin and diclofenac on an as-needed basis - Referral coordinator will contact you to schedule visits with physical therapy and interventional spine - Contact us for any question/concerns and follow-up as needed - After evaluation by interventional spine group, can consider adjunct procedures such as greater trochanter, iliopsoas bursa, and SI joint injections as clinically guided  Orders & Medications Meds ordered this encounter  Medications   gabapentin (NEURONTIN) 300 MG capsule    Sig: Take 1 capsule (300 mg total) by mouth at bedtime as needed.    Dispense:  30 capsule    Refill:  1   diclofenac (VOLTAREN) 75 MG EC tablet    Sig: Take 1 tablet (  75 mg total) by mouth 2 (two) times daily as needed.    Dispense:  60 tablet    Refill:  1   Orders Placed This Encounter  Procedures   Ambulatory referral to Pain Clinic   Ambulatory referral to Physical Therapy     I discussed the above assessment and treatment plan with the patient. The patient was provided an opportunity to ask questions and all were answered. The patient agreed with the plan and demonstrated an understanding of the instructions.   The patient was advised to call back or  seek an in-person evaluation if the symptoms worsen or if the condition fails to improve as anticipated.   I provided a total time of 40 minutes including both face-to-face and non-face-to-face time on 09/07/2022 inclusive of time utilized for medical chart review, information gathering, care coordination with staff, and documentation completion.    Jerrol Banana, MD, [facility]   Primary Care Sports Medicine Primary Care and Sports Medicine at [facility]

## 2022-09-16 ENCOUNTER — Ambulatory Visit: Payer: Federal, State, Local not specified - PPO | Attending: Family Medicine

## 2022-09-16 DIAGNOSIS — M25551 Pain in right hip: Secondary | ICD-10-CM | POA: Insufficient documentation

## 2022-09-16 DIAGNOSIS — M4727 Other spondylosis with radiculopathy, lumbosacral region: Secondary | ICD-10-CM | POA: Diagnosis not present

## 2022-09-16 DIAGNOSIS — M5386 Other specified dorsopathies, lumbar region: Secondary | ICD-10-CM | POA: Diagnosis not present

## 2022-09-16 DIAGNOSIS — M5416 Radiculopathy, lumbar region: Secondary | ICD-10-CM | POA: Diagnosis not present

## 2022-09-16 DIAGNOSIS — M545 Low back pain, unspecified: Secondary | ICD-10-CM | POA: Diagnosis not present

## 2022-09-16 NOTE — Therapy (Signed)
OUTPATIENT PHYSICAL THERAPY THORACOLUMBAR EVALUATION   Patient Name: Norma Wall MRN: [id number] DOB:12-08-1970, 52 y.o., female Today's Date: 09/16/2022  END OF SESSION:   PT End of Session - 09/16/22 0908     Visit Number 1    Number of Visits 12    Date for PT Re-Evaluation 10/28/22    Authorization - Visit Number 1    Authorization - Number of Visits 50    Progress Note Due on Visit 10    PT Start Time 0902    PT Stop Time 0945    PT Time Calculation (min) 43 min    Activity Tolerance Patient tolerated treatment well             Past Medical History:  Diagnosis Date   Abdominal pain, right upper quadrant 2013   Anxiety    Cancer (HCC)    basal cells- [place]   COVID-19 2021   Endometriosis 01/2013   Gallbladder polyp    Lipidemia 2020   Lymphedema 01/2019   Migraine headache    3-4x/yr   Ovarian cyst 2014   right ovary   PONV (postoperative nausea and vomiting)    Vertigo    resolves quickly   Past Surgical History:  Procedure Laterality Date   ABLATION  2012   cyst removal   CHOLECYSTECTOMY N/A 10/19/2018   Procedure: LAPAROSCOPIC CHOLECYSTECTOMY;  Surgeon: Leafy Ro, MD;  Location: [facility];  Service: General;  Laterality: N/A;   COLONOSCOPY WITH PROPOFOL N/A 07/21/2020   Procedure: COLONOSCOPY WITH PROPOFOL;  Surgeon: Midge Minium, MD;  Location: [facility];  Service: Endoscopy;  Laterality: N/A;   CYSTOSCOPY N/A 09/25/2020   Procedure: CYSTOSCOPY;  Surgeon: Vena Austria, MD;  Location: [facility];  Service: Gynecology;  Laterality: N/A;   CYSTOSCOPY W/ URETERAL STENT PLACEMENT Right 09/25/2020   Procedure: CYSTOSCOPY WITH RETROGRADE PYELOGRAM/URETERAL STENT PLACEMENT;  Surgeon: Sondra Come, MD;  Location: [facility];  Service: Urology;  Laterality: Right;   LAPAROSCOPIC OVARIAN CYSTECTOMY Right 10/19/2018   Procedure: LAPAROSCOPIC RIGHT OVARIAN CYSTECTOMY;  Surgeon: Vena Austria, MD;  Location: [facility];  Service:  Gynecology;  Laterality: Right;   NECK SURGERY  02/22/2018   Artificial Disc replacement    OVARIAN CYST REMOVAL     POLYPECTOMY N/A 07/21/2020   Procedure: POLYPECTOMY;  Surgeon: Midge Minium, MD;  Location: [facility];  Service: Endoscopy;  Laterality: N/A;   TOTAL LAPAROSCOPIC HYSTERECTOMY WITH SALPINGECTOMY Bilateral 09/25/2020   Procedure: TOTAL LAPAROSCOPIC HYSTERECTOMY WITH SALPINGECTOMY;  Surgeon: Vena Austria, MD;  Location: [facility];  Service: Gynecology;  Laterality: Bilateral;   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   Lipedema 01/10/2019   Endometriosis determined by laparoscopy 07/06/2018   Dermatitis, eczematoid 11/20/2014   External hemorrhoid 11/20/2014   Greater trochanteric pain syndrome of right lower extremity 11/20/2014   Migraine 08/28/2014   Gallbladder polyp 09/13/2012   Avitaminosis D 05/19/2009    PCP: Erasmo Downer, MD   REFERRING PROVIDER: Jerrol Banana, MD   REFERRING DIAG:  [phone] (ICD-10-CM) - Multilevel lumbosacral  spondylosis with radiculopathy M25.551 (ICD-10-CM) - Greater trochanteric pain syndrome of right lower extremity   Rationale for Evaluation and Treatment: Rehabilitation  THERAPY DIAG:  Lumbar pain - Plan: PT plan of care cert/re-cert  Radiculopathy, lumbar region - Plan: PT plan of care cert/re-cert  Decreased ROM of lumbar spine - Plan: PT plan of care cert/re-cert  ONSET DATE: 9 months  SUBJECTIVE:                                                                                                                                                                                            SUBJECTIVE STATEMENT:  Pt reports she has been having this discomfort in the back for years, but it comes in rounds.  Pt notes this round has been going on for 9 months.  Pt states the closest she got to complete relief was when she was given steroids and gabapentin.  Pt states she would like to note have to take medicine for the rest of her life for the pain if possible.  Pt notes she typically has the most pain at night when at rest.  Pt states laying on either side would provoke the pain and then laying on her belly or back, it would relieve the pain.  PERTINENT HISTORY:  See subjective  PAIN:  Are you having pain? Yes: NPRS scale: 1/10 Pain location: low back Pain description: dull, achey Aggravating factors: laying on side, cross legs Relieving factors: steroid round,   PRECAUTIONS: None  RED FLAGS: None   WEIGHT BEARING RESTRICTIONS: No  FALLS:  Has patient fallen in last 6 months? Yes. Number of falls slipped in the mud  LIVING ENVIRONMENT: Lives with: lives with their family Lives in: House/apartment Stairs: Yes: Internal: 13 steps; on right going up and External: 3 steps; none Has following equipment at home: None  OCCUPATION: Former SLP, now working for [employer]  PLOF: Independent  PATIENT GOALS: to reduce the pain  NEXT MD VISIT: Not sure  OBJECTIVE:   DIAGNOSTIC FINDINGS:    EXAM: MRI LUMBAR SPINE WITHOUT CONTRAST  FINDINGS: Segmentation:  5 lumbar vertebrae.   Alignment: Minimal retrolisthesis of L2 on L3, L3 on L4, and L4 on L5.   Vertebrae: No fracture, suspicious marrow lesion, or significant marrow edema.   Conus medullaris and cauda equina: Conus extends to the L1 level. Conus and cauda equina appear normal.   Paraspinal and other soft tissues: Unremarkable.   Disc levels:   T12-L1: Negative.   L1-2: Mild disc bulging  without stenosis.   L2-3: Disc desiccation and moderate disc space narrowing. Retrolisthesis with bulging uncovered disc  and mild facet hypertrophy result in mild-to-moderate bilateral lateral recess stenosis without significant spinal or neural foraminal stenosis.   L3-4: Disc desiccation and mild disc space narrowing. Retrolisthesis with bulging uncovered disc and mild facet hypertrophy result in mild right and mild-to-moderate left lateral recess stenosis and mild bilateral neural foraminal stenosis without spinal stenosis.   L4-5: Disc desiccation and mild disc space narrowing. Retrolisthesis with bulging uncovered disc and moderate facet and ligamentum flavum hypertrophy result in mild-to-moderate bilateral lateral recess stenosis and moderate right and mild left neural foraminal stenosis without spinal stenosis.   L5-S1: Moderate left greater than right facet hypertrophy without disc herniation or stenosis.   IMPRESSION: Multilevel lumbar disc and facet degeneration resulting in mild-to-moderate lateral recess and neural foraminal stenosis as above.    EXAM: MR OF THE RIGHT HIP WITHOUT CONTRAST  FINDINGS: Bones: No acute fracture. No dislocation. No femoral head avascular necrosis. Bony pelvis intact without diastasis. SI joints and pubic symphysis within normal limits. No bone marrow edema. No marrow replacing bone lesion.   Articular cartilage and labrum   Articular cartilage: Mild cartilage thinning with small subchondral cystic changes at the anterosuperior aspect of the right acetabulum.   Labrum: Grossly intact, although evaluation is limited in the absence of intra-articular fluid or contrast. No paralabral cyst.   Joint or bursal effusion   Joint effusion:  None.   Bursae: Trace left iliopsoas bursal fluid. No abnormal right-sided bursal fluid collection.   Muscles and tendons   Muscles and tendons: Mild tendinosis of the bilateral hamstring tendon  origins. The gluteal, iliopsoas, rectus femoris, and adductor tendons appear intact without tear or significant tendinosis. Normal muscle bulk and signal intensity without edema, atrophy, or fatty infiltration.   Other findings   Miscellaneous: No soft tissue edema or fluid collection. No inguinal lymphadenopathy. Colonic diverticulosis. Small left adnexal cysts measuring up to 1.8 cm in size. No follow-up imaging recommended. Note: This recommendation does not apply to premenarchal patients and to those with increased risk (genetic, family history, elevated tumor markers or other high-risk factors) of ovarian cancer. Reference: JACR 2020 Feb; 17(2):248-254   IMPRESSION: 1. Mild right hip osteoarthritis. 2. Mild tendinosis of the bilateral hamstring tendon origins. 3. Trace left iliopsoas bursal fluid. 4. Colonic diverticulosis.     PATIENT SURVEYS:  FOTO 72    COGNITION: Overall cognitive status: Within functional limits for tasks assessed     SENSATION: WFL   POSTURE: No Significant postural limitations  PALPATION: Pt with some tightness noted in the piriformis   LUMBAR ROM:   AROM eval  Flexion 100  Extension 100  Right lateral flexion 100  Left lateral flexion 100  Right rotation 80  Left rotation 100   (Blank rows = not tested)  LOWER EXTREMITY ROM:     Active  Right eval Left eval  Hip flexion    Hip extension    Hip abduction    Hip adduction    Hip internal rotation    Hip external rotation    Knee flexion    Knee extension    Ankle dorsiflexion    Ankle plantarflexion    Ankle inversion    Ankle eversion     (Blank rows = not tested)   LOWER EXTREMITY MMT:    MMT Right eval Left eval  Hip flexion 4 4-  Hip extension 4 4  Hip abduction 4 4-  Hip adduction 4- 4-  Hip internal rotation    Hip external rotation  Knee flexion 4- 4-  Knee extension 4- 4-  Ankle dorsiflexion 5 5  Ankle plantarflexion    Ankle inversion     Ankle eversion     (Blank rows = not tested)   LUMBAR SPECIAL TESTS:   Straight leg raise test: Negative, Slump test: Negative, Single leg stance test: Positive, Stork standing: Positive, SI Compression/distraction test: Negative, FABER test: Positive, and Trendelenburg sign: Positive  FUNCTIONAL TESTS:  N/A    TODAY'S TREATMENT: DATE: 09/16/22   Eval Only    PATIENT EDUCATION: Education details: Pt educated on role of PT and services provided during current POC, along with prognosis and information about the clinic. Person educated: Patient Education method: Explanation Education comprehension: verbalized understanding, returned demonstration, and verbal cues required    HOME EXERCISE PROGRAM:  FINDINGS: Bones: No acute fracture. No dislocation. No femoral head avascular necrosis. Bony pelvis intact without diastasis. SI joints and pubic symphysis within normal limits. No bone marrow edema. No marrow replacing bone lesion.   Articular cartilage and labrum   Articular cartilage: Mild cartilage thinning with small subchondral cystic changes at the anterosuperior aspect of the right acetabulum.   Labrum: Grossly intact, although evaluation is limited in the absence of intra-articular fluid or contrast. No paralabral cyst.   Joint or bursal effusion   Joint effusion:  None.   Bursae: Trace left iliopsoas bursal fluid. No abnormal right-sided bursal fluid collection.  Muscles and tendons   Muscles and tendons: Mild tendinosis of the bilateral hamstring tendon origins. The gluteal, iliopsoas, rectus femoris, and adductor tendons appear intact without tear or significant tendinosis. Normal muscle bulk and signal intensity without edema, atrophy, or fatty infiltration.   Other findings   Miscellaneous: No soft tissue edema or fluid collection. No inguinal lymphadenopathy. Colonic diverticulosis. Small left adnexal cysts measuring up to 1.8 cm in size. No  follow-up imaging recommended. Note: This recommendation does not apply to premenarchal patients and to those with increased risk (genetic, family history, elevated tumor markers or other high-risk factors) of ovarian cancer. Reference: JACR 2020 Feb; 17(2):248-254   IMPRESSION: 1. Mild right hip osteoarthritis. 2. Mild tendinosis of the bilateral hamstring tendon origins. 3. Trace left iliopsoas bursal fluid. 4. Colonic diverticulosis.    ASSESSMENT:  CLINICAL IMPRESSION: Patient is a 52 y.o. female who was seen today for physical therapy evaluation and treatment for low back pain.   Pt continues to be independent with all tasks, however has increased back pain that causes her to self-limit things such as lifting larger items and such that may be part of her occupation.  Pt is hoping to alleviate the pain prior to it becoming more of a constant pain that turns into chronic pain.  Pt does demonstrate lack of core strength that will be address in future sessions in order to improve overall function and stability of the spine.  Pt also demonstrates hypomobility of the lumbar spine and was given exercises to target all areas of concern listed above.  Pt demonstrates understanding of this plan of care and agrees with this plan.    OBJECTIVE IMPAIRMENTS: decreased ROM, decreased strength, and pain.   ACTIVITY LIMITATIONS: carrying, lifting, bending, and squatting  PARTICIPATION LIMITATIONS: cleaning, laundry, occupation, and yard work  PERSONAL FACTORS: Past/current experiences, Profession, Time since onset of injury/illness/exacerbation, and 1-2 comorbidities: anxiety, vertigo  are also affecting patient's functional outcome.   REHAB POTENTIAL: Good  CLINICAL DECISION MAKING: Stable/uncomplicated  EVALUATION COMPLEXITY: Low   GOALS: Goals reviewed with patient? Yes  SHORT TERM GOALS: Target date: 10/14/2022  Pt will be independent with HEP in order to demonstrate increased ability  to perform tasks related to occupation/hobbies. Baseline:  Pt given HEP at initial evaluation Goal status: INITIAL  LONG TERM GOALS: Target date: 12/09/2022  Patient will demonstrate improved function as evidenced by a score of 74 on FOTO measure for full participation in activities at home and in the community. Baseline: 09/16/22: 72 Goal status: INITIAL  2.  Pt will be able to demonstrate proper hip hinge lift in order to be able to lift heavier items necessary at work with no pain Baseline: Pt reports increased pain with lifting heavier items at work. Goal status: INITIAL  3.  Pt will improve lumbar rotation to the R to be 100% of the L which is necessary for tasks such as driving and performing work duties.  Baseline: Pt is at 80% of baseline levels to the R side. Goal status: INITIAL  4.  Pt to report no pain on NPRS following a full shift at work in order to demonstrate improved functional tasks necessary for jobs. Baseline: 1/10 sitting at rest.   Goal status: INITIAL  PLAN:  PT FREQUENCY: 1-2x/week  PT DURATION: 6 weeks  PLANNED INTERVENTIONS: Therapeutic exercises, Therapeutic activity, Neuromuscular re-education, Balance training, Gait training, Patient/Family education, Self Care, Joint mobilization, Joint manipulation, Vestibular training, Canalith repositioning, Aquatic Therapy, Dry Needling, Electrical stimulation, Spinal manipulation, Spinal mobilization, Cryotherapy, Moist heat, Taping, Ultrasound, Manual therapy, and Re-evaluation.  PLAN FOR NEXT SESSION: Assess compliance with HEP, continue assessment of the sacral/pelvic region as time allows.     Nolon Bussing, PT, DPT Physical Therapist - [facility]  09/16/22, 4:58 PM

## 2022-09-21 ENCOUNTER — Ambulatory Visit: Payer: Federal, State, Local not specified - PPO

## 2022-09-21 DIAGNOSIS — M5416 Radiculopathy, lumbar region: Secondary | ICD-10-CM

## 2022-09-21 DIAGNOSIS — M545 Low back pain, unspecified: Secondary | ICD-10-CM | POA: Diagnosis not present

## 2022-09-21 DIAGNOSIS — M5386 Other specified dorsopathies, lumbar region: Secondary | ICD-10-CM | POA: Diagnosis not present

## 2022-09-21 DIAGNOSIS — M4727 Other spondylosis with radiculopathy, lumbosacral region: Secondary | ICD-10-CM | POA: Diagnosis not present

## 2022-09-21 DIAGNOSIS — M25551 Pain in right hip: Secondary | ICD-10-CM | POA: Diagnosis not present

## 2022-09-21 NOTE — Therapy (Signed)
OUTPATIENT PHYSICAL THERAPY  Treatment   Patient Name: Norma Wall MRN: [id number] DOB:08-18-1970, 52 y.o., female Today's Date: 09/21/2022  END OF SESSION:   PT End of Session - 09/21/22 0949     Visit Number 2    Number of Visits 12    Date for PT Re-Evaluation 10/28/22    Authorization - Number of Visits 50    Progress Note Due on Visit 10    PT Start Time 0949    PT Stop Time 1034    PT Time Calculation (min) 45 min    Activity Tolerance Patient tolerated treatment well              Past Medical History:  Diagnosis Date   Abdominal pain, right upper quadrant 2013   Anxiety    Cancer (HCC)    basal cells- [place]   COVID-19 2021   Endometriosis 01/2013   Gallbladder polyp    Lipidemia 2020   Lymphedema 01/2019   Migraine headache    3-4x/yr   Ovarian cyst 2014   right ovary   PONV (postoperative nausea and vomiting)    Vertigo    resolves quickly   Past Surgical History:  Procedure Laterality Date   ABLATION  2012   cyst removal   CHOLECYSTECTOMY N/A 10/19/2018   Procedure: LAPAROSCOPIC CHOLECYSTECTOMY;  Surgeon: Leafy Ro, MD;  Location: [facility];  Service: General;  Laterality: N/A;   COLONOSCOPY WITH PROPOFOL N/A 07/21/2020   Procedure: COLONOSCOPY WITH PROPOFOL;  Surgeon: Midge Minium, MD;  Location: [facility];  Service: Endoscopy;  Laterality: N/A;   CYSTOSCOPY N/A 09/25/2020   Procedure: CYSTOSCOPY;  Surgeon: Vena Austria, MD;  Location: [facility];  Service: Gynecology;  Laterality: N/A;   CYSTOSCOPY W/ URETERAL STENT PLACEMENT Right 09/25/2020   Procedure: CYSTOSCOPY WITH RETROGRADE PYELOGRAM/URETERAL STENT PLACEMENT;  Surgeon: Sondra Come, MD;  Location: [facility];  Service: Urology;  Laterality: Right;   LAPAROSCOPIC OVARIAN CYSTECTOMY Right 10/19/2018   Procedure: LAPAROSCOPIC RIGHT OVARIAN CYSTECTOMY;  Surgeon: Vena Austria, MD;  Location: [facility];  Service: Gynecology;  Laterality: Right;   NECK SURGERY   02/22/2018   Artificial Disc replacement    OVARIAN CYST REMOVAL     POLYPECTOMY N/A 07/21/2020   Procedure: POLYPECTOMY;  Surgeon: Midge Minium, MD;  Location: [facility];  Service: Endoscopy;  Laterality: N/A;   TOTAL LAPAROSCOPIC HYSTERECTOMY WITH SALPINGECTOMY Bilateral 09/25/2020   Procedure: TOTAL LAPAROSCOPIC HYSTERECTOMY WITH SALPINGECTOMY;  Surgeon: Vena Austria, MD;  Location: [facility];  Service: Gynecology;  Laterality: Bilateral;   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   Lipedema 01/10/2019   Endometriosis determined by laparoscopy 07/06/2018   Dermatitis, eczematoid 11/20/2014   External hemorrhoid 11/20/2014   Greater trochanteric pain syndrome of right lower extremity 11/20/2014   Migraine 08/28/2014   Gallbladder polyp 09/13/2012   Avitaminosis D 05/19/2009    PCP: Erasmo Downer, MD   REFERRING PROVIDER: Jerrol Banana, MD   REFERRING DIAG:  [phone] (ICD-10-CM) - Multilevel lumbosacral spondylosis with radiculopathy M25.551 (ICD-10-CM) - Greater  trochanteric pain syndrome of right lower extremity   Rationale for Evaluation and Treatment: Rehabilitation  THERAPY DIAG:  Lumbar pain  Radiculopathy, lumbar region  Decreased ROM of lumbar spine  ONSET DATE: 9 months  SUBJECTIVE:                                                                                                                                                                                           SUBJECTIVE STATEMENT: Back needs some work. A  little more inflamed this week than last Thursday. Has B lumbar paraspinal muscle tightness. 2/10 currently. 4/10 when laying down at night, when laying on her side. Laying on her back or stomach makes her back pain feel better. Has B L 5 dermatome symptoms to B patella. The most relief she has had was a steroid and gabapentin. The last 2 nights have been the worst since starting her steroid round. The S/L clamshell HEP bothers her hips.   PERTINENT HISTORY:  See subjective  PAIN:  Are you having pain? Yes: NPRS scale: 2/10 Pain location: low back Pain description: dull, achey Aggravating factors: laying on side, cross legs Relieving factors: steroid round,   PRECAUTIONS: None  RED FLAGS: None   WEIGHT BEARING RESTRICTIONS: No  FALLS:  Has patient fallen in last 6 months? Yes. Number of falls slipped in the mud  LIVING ENVIRONMENT: Lives with: lives with their family Lives in: House/apartment Stairs: Yes: Internal: 13 steps; on right going up and External: 3 steps; none Has following equipment at home: None  OCCUPATION: Former SLP, now working for [employer]  PLOF: Independent  PATIENT GOALS: to reduce the pain  NEXT MD VISIT: Not sure  OBJECTIVE:   DIAGNOSTIC FINDINGS:    EXAM: MRI LUMBAR SPINE WITHOUT CONTRAST  FINDINGS: Segmentation:  5 lumbar vertebrae.   Alignment: Minimal retrolisthesis of L2 on L3, L3 on L4, and L4 on L5.   Vertebrae: No fracture, suspicious marrow lesion, or significant marrow edema.   Conus medullaris and cauda equina: Conus extends to the L1 level. Conus and cauda equina appear normal.   Paraspinal and other soft tissues: Unremarkable.   Disc levels:   T12-L1: Negative.   L1-2: Mild disc bulging without stenosis.   L2-3: Disc desiccation and moderate disc space narrowing. Retrolisthesis with bulging uncovered disc and mild facet hypertrophy result in mild-to-moderate bilateral lateral recess stenosis without  significant spinal or neural foraminal stenosis.   L3-4: Disc desiccation and mild disc space narrowing. Retrolisthesis with bulging uncovered disc and mild facet hypertrophy result in mild right and mild-to-moderate left lateral recess stenosis and mild bilateral neural foraminal stenosis  without spinal stenosis.   L4-5: Disc desiccation and mild disc space narrowing. Retrolisthesis with bulging uncovered disc and moderate facet and ligamentum flavum hypertrophy result in mild-to-moderate bilateral lateral recess stenosis and moderate right and mild left neural foraminal stenosis without spinal stenosis.   L5-S1: Moderate left greater than right facet hypertrophy without disc herniation or stenosis.   IMPRESSION: Multilevel lumbar disc and facet degeneration resulting in mild-to-moderate lateral recess and neural foraminal stenosis as above.    EXAM: MR OF THE RIGHT HIP WITHOUT CONTRAST  FINDINGS: Bones: No acute fracture. No dislocation. No femoral head avascular necrosis. Bony pelvis intact without diastasis. SI joints and pubic symphysis within normal limits. No bone marrow edema. No marrow replacing bone lesion.   Articular cartilage and labrum   Articular cartilage: Mild cartilage thinning with small subchondral cystic changes at the anterosuperior aspect of the right acetabulum.   Labrum: Grossly intact, although evaluation is limited in the absence of intra-articular fluid or contrast. No paralabral cyst.   Joint or bursal effusion   Joint effusion:  None.   Bursae: Trace left iliopsoas bursal fluid. No abnormal right-sided bursal fluid collection.   Muscles and tendons   Muscles and tendons: Mild tendinosis of the bilateral hamstring tendon origins. The gluteal, iliopsoas, rectus femoris, and adductor tendons appear intact without tear or significant tendinosis. Normal muscle bulk and signal intensity without edema, atrophy, or fatty infiltration.    Other findings   Miscellaneous: No soft tissue edema or fluid collection. No inguinal lymphadenopathy. Colonic diverticulosis. Small left adnexal cysts measuring up to 1.8 cm in size. No follow-up imaging recommended. Note: This recommendation does not apply to premenarchal patients and to those with increased risk (genetic, family history, elevated tumor markers or other high-risk factors) of ovarian cancer. Reference: JACR 2020 Feb; 17(2):248-254   IMPRESSION: 1. Mild right hip osteoarthritis. 2. Mild tendinosis of the bilateral hamstring tendon origins. 3. Trace left iliopsoas bursal fluid. 4. Colonic diverticulosis.     PATIENT SURVEYS:  FOTO 72    COGNITION: Overall cognitive status: Within functional limits for tasks assessed     SENSATION: WFL   POSTURE: No Significant postural limitations  PALPATION: Pt with some tightness noted in the piriformis   LUMBAR ROM:   AROM eval  Flexion 100  Extension 100  Right lateral flexion 100  Left lateral flexion 100  Right rotation 80  Left rotation 100   (Blank rows = not tested)   AROM 09/21/2022  Flexion Full with reproduction of central low back and B lumbar paraspinal pain  Extension Full with mild central low back pain  Right lateral flexion full  Left lateral flexion Full with R lumbar paraspinal muscle tightness  Right rotation 80, no symptoms  Left rotation 100, no symptoms.       LOWER EXTREMITY ROM:     Active  Right eval Left eval  Hip flexion    Hip extension    Hip abduction    Hip adduction    Hip internal rotation    Hip external rotation    Knee flexion    Knee extension    Ankle dorsiflexion    Ankle plantarflexion    Ankle inversion    Ankle eversion     (Blank rows = not tested)   LOWER EXTREMITY MMT:    MMT Right eval Left eval  Hip flexion 4 4-  Hip extension 4 4  Hip abduction 4 4-  Hip adduction 4- 4-  Hip internal rotation  Hip external rotation    Knee  flexion 4- 4-  Knee extension 4- 4-  Ankle dorsiflexion 5 5  Ankle plantarflexion    Ankle inversion    Ankle eversion     (Blank rows = not tested)   LUMBAR SPECIAL TESTS:   Straight leg raise test: Negative, Slump test: Negative, Single leg stance test: Positive, Stork standing: Positive, SI Compression/distraction test: Negative, FABER test: Positive, and Trendelenburg sign: Positive  FUNCTIONAL TESTS:  N/A    TODAY'S TREATMENT: DATE: 09/21/22   No latex allergies  Therapeutic exercises  Standing lumbar AROM all planes  (+) repeated flexion test  TTP B greater trochanters   Hooklying  transversus abdominis contraction 10x5 seconds for 3 sets   Clamshell yellow band 10x3   Bridge with yellow band hip abduction resistance at neutral thighs 10x3   Posterior pelvic tilt 10x5 seconds for 3 sets  Seated trunk flexion stretch 6x5 seconds. R sciatic nerve symptoms with increased repetition  Seated lumbar towel roll with gentle extension 6x5 seconds. Increased R back pain  Sitting with B forearms propped onto thighs  Decreased symptoms   Then with transversus abdominis contraction 10x5 seconds    Decreased pain  Improved exercise technique, movement at target joints, use of target muscles after mod verbal, visual, tactile cues.           PATIENT EDUCATION: Education details: There-ex, HEP   Person educated: Patient Education method: Explanation, demonstration, tactile cues Education comprehension: verbalized understanding, returned demonstration   HOME EXERCISE PROGRAM:  Access Code: [email or web address] URL: [email or web address] Date: 09/21/2022 Prepared by: Loralyn Freshwater  Exercises - Supine Transversus Abdominis Bracing - Hands on Stomach  - 1 x daily - 7 x weekly - 3 sets - 10 reps - 5 seconds hold  - Hooklying Clamshell with Resistance  - 1 x daily - 7 x weekly - 3 sets - 10 reps  Yellow band  Sitting with B forearms propped onto  thighs  Decreased symptoms   Then with transversus abdominis contraction 10x5 seconds    Decreased pain      FINDINGS: Bones: No acute fracture. No dislocation. No femoral head avascular necrosis. Bony pelvis intact without diastasis. SI joints and pubic symphysis within normal limits. No bone marrow edema. No marrow replacing bone lesion.   Articular cartilage and labrum   Articular cartilage: Mild cartilage thinning with small subchondral cystic changes at the anterosuperior aspect of the right acetabulum.   Labrum: Grossly intact, although evaluation is limited in the absence of intra-articular fluid or contrast. No paralabral cyst.   Joint or bursal effusion   Joint effusion:  None.   Bursae: Trace left iliopsoas bursal fluid. No abnormal right-sided bursal fluid collection.  Muscles and tendons   Muscles and tendons: Mild tendinosis of the bilateral hamstring tendon origins. The gluteal, iliopsoas, rectus femoris, and adductor tendons appear intact without tear or significant tendinosis. Normal muscle bulk and signal intensity without edema, atrophy, or fatty infiltration.   Other findings   Miscellaneous: No soft tissue edema or fluid collection. No inguinal lymphadenopathy. Colonic diverticulosis. Small left adnexal cysts measuring up to 1.8 cm in size. No follow-up imaging recommended. Note: This recommendation does not apply to premenarchal patients and to those with increased risk (genetic, family history, elevated tumor markers or other high-risk factors) of ovarian cancer. Reference: JACR 2020 Feb; 17(2):248-254   IMPRESSION: 1. Mild right hip osteoarthritis. 2. Mild tendinosis of the bilateral hamstring tendon origins. 3. Trace left iliopsoas bursal fluid.  4. Colonic diverticulosis.    ASSESSMENT:  CLINICAL IMPRESSION: Worked on supine resisted clamshell to promote proper stress to distal insertion to B glute med to the greater trochanter to  promote healing. Worked on transversus abdominis activation in supine as well as in slight flexion to promote more neutral lumbar posture and decrease stress to her low back. Fair tolerance to today's session. Pt will benefit from continued skilled physical therapy services to decrease pain, improve strength and function.     OBJECTIVE IMPAIRMENTS: decreased ROM, decreased strength, and pain.   ACTIVITY LIMITATIONS: carrying, lifting, bending, and squatting  PARTICIPATION LIMITATIONS: cleaning, laundry, occupation, and yard work  PERSONAL FACTORS: Past/current experiences, Profession, Time since onset of injury/illness/exacerbation, and 1-2 comorbidities: anxiety, vertigo  are also affecting patient's functional outcome.   REHAB POTENTIAL: Good  CLINICAL DECISION MAKING: Stable/uncomplicated  EVALUATION COMPLEXITY: Low   GOALS: Goals reviewed with patient? Yes  SHORT TERM GOALS: Target date: 10/14/2022  Pt will be independent with HEP in order to demonstrate increased ability to perform tasks related to occupation/hobbies. Baseline:  Pt given HEP at initial evaluation Goal status: INITIAL  LONG TERM GOALS: Target date: 12/09/2022  Patient will demonstrate improved function as evidenced by a score of 74 on FOTO measure for full participation in activities at home and in the community. Baseline: 09/16/22: 72 Goal status: INITIAL  2.  Pt will be able to demonstrate proper hip hinge lift in order to be able to lift heavier items necessary at work with no pain Baseline: Pt reports increased pain with lifting heavier items at work. Goal status: INITIAL  3.  Pt will improve lumbar rotation to the R to be 100% of the L which is necessary for tasks such as driving and performing work duties.  Baseline: Pt is at 80% of baseline levels to the R side. Goal status: INITIAL  4.  Pt to report no pain on NPRS following a full shift at work in order to demonstrate improved functional tasks  necessary for jobs. Baseline: 1/10 sitting at rest.   Goal status: INITIAL  PLAN:  PT FREQUENCY: 1-2x/week  PT DURATION: 6 weeks  PLANNED INTERVENTIONS: Therapeutic exercises, Therapeutic activity, Neuromuscular re-education, Balance training, Gait training, Patient/Family education, Self Care, Joint mobilization, Joint manipulation, Vestibular training, Canalith repositioning, Aquatic Therapy, Dry Needling, Electrical stimulation, Spinal manipulation, Spinal mobilization, Cryotherapy, Moist heat, Taping, Ultrasound, Manual therapy, and Re-evaluation.  PLAN FOR NEXT SESSION: Assess compliance with HEP, continue assessment of the sacral/pelvic region as time allows.    Loralyn Freshwater PT, DPT Physical Therapist - [facility]  09/21/22, 12:37 PM

## 2022-09-23 ENCOUNTER — Ambulatory Visit: Payer: Federal, State, Local not specified - PPO | Admitting: Gastroenterology

## 2022-09-23 ENCOUNTER — Encounter: Payer: Self-pay | Admitting: Gastroenterology

## 2022-09-23 VITALS — BP 124/84 | HR 76 | Temp 98.6°F | Ht 67.0 in | Wt 238.0 lb

## 2022-09-23 DIAGNOSIS — R109 Unspecified abdominal pain: Secondary | ICD-10-CM

## 2022-09-23 DIAGNOSIS — R1032 Left lower quadrant pain: Secondary | ICD-10-CM

## 2022-09-23 DIAGNOSIS — K5904 Chronic idiopathic constipation: Secondary | ICD-10-CM

## 2022-09-23 DIAGNOSIS — R1011 Right upper quadrant pain: Secondary | ICD-10-CM | POA: Diagnosis not present

## 2022-09-23 MED ORDER — LINACLOTIDE 72 MCG PO CAPS: 72.0000 ug | ORAL_CAPSULE | Freq: Every day | ORAL | Status: DC

## 2022-09-23 NOTE — Patient Instructions (Signed)
Linzess 72 mcg samples given  Start with 1 capsule daily, if no bowel movement start 2 capsules daily

## 2022-09-23 NOTE — Progress Notes (Signed)
Primary Care Physician: Erasmo Downer, MD  Primary Gastroenterologist:  Dr. Midge Minium  Chief Complaint  Patient presents with   New Patient (Initial Visit)   Abdominal Pain    HPI: Norma Wall is a 52 y.o. female here after seeing me back in 2022 for a colonoscopy.  The patient had a few polyps and was recommended to have a repeat colonoscopy in 5 years.  The patient was now reported to have abdominal pain.  The patient had a CT scan of the abdomen with a report of left flank pain and left lower quadrant pain that showed:  IMPRESSION: No acute findings.  No evidence of urolithiasis or hydronephrosis.   Colonic diverticulosis, without radiographic evidence of diverticulitis.  The patient reports that she has had right upper quadrant pain for over 10 years with the first thought being her gallbladder for which she had it taken out but states that it did not change her pain.  The patient denies any nausea vomiting fevers chills black stools or bloody stools. The patient has pain in the left side of her abdomen that she states gets better after she moves her bowels.  The patient reports that she moves her bowels approximate once every 4 days.  Past Medical History:  Diagnosis Date   Abdominal pain, right upper quadrant 2013   Anxiety    Cancer (HCC)    basal cells- [place]   COVID-19 2021   Endometriosis 01/2013   Gallbladder polyp    Lipidemia 2020   Lymphedema 01/2019   Migraine headache    3-4x/yr   Ovarian cyst 2014   right ovary   PONV (postoperative nausea and vomiting)    Vertigo    resolves quickly    Current Outpatient Medications  Medication Sig Dispense Refill   acetaminophen (TYLENOL) 500 MG tablet Take 1,000 mg by mouth every 6 (six) hours as needed for moderate pain, fever or headache.     Calcium Carbonate (CALCIUM 500 PO) Take by mouth.     Cholecalciferol (VITAMIN D3) 50 MCG (2000 UT) CAPS Take by mouth.     diclofenac (VOLTAREN) 75 MG  EC tablet Take 1 tablet (75 mg total) by mouth 2 (two) times daily as needed. 60 tablet 1   gabapentin (NEURONTIN) 300 MG capsule Take 1 capsule (300 mg total) by mouth at bedtime as needed. 30 capsule 1   ibuprofen (ADVIL) 200 MG tablet Take 200 mg by mouth as needed.     SUMAtriptan (IMITREX) 50 MG tablet TAKE 1 TABLET BY MOUTH EVERY 2 HOURS FOR MIGRAINE MAY REPEAT IN 2 HOURS IF HEADACHE PERSISTS OR RECURS 10 tablet 2   No current facility-administered medications for this visit.    Allergies as of 09/23/2022   (No Known Allergies)    ROS:  General: Negative for anorexia, weight loss, fever, chills, fatigue, weakness. ENT: Negative for hoarseness, difficulty swallowing , nasal congestion. CV: Negative for chest pain, angina, palpitations, dyspnea on exertion, peripheral edema.  Respiratory: Negative for dyspnea at rest, dyspnea on exertion, cough, sputum, wheezing.  GI: See history of present illness. GU:  Negative for dysuria, hematuria, urinary incontinence, urinary frequency, nocturnal urination.  Endo: Negative for unusual weight change.    Physical Examination:   BP 124/84 (BP Location: Left Arm, Patient Position: Sitting, Cuff Size: Large)   Pulse 76   Temp 98.6 F (37 C) (Oral)   Ht 5\' 7"  (1.702 m)   Wt 238 lb (108 kg)  LMP 09/08/2020   BMI 37.28 kg/m   General: Well-nourished, well-developed in no acute distress.  Eyes: No icterus. Conjunctivae pink. Lungs: Clear to auscultation bilaterally. Non-labored. Heart: Regular rate and rhythm, no murmurs rubs or gallops.  Abdomen: Bowel sounds are normal, nontender, nondistended, no hepatosplenomegaly or masses, no abdominal bruits or hernia , no rebound or guarding.   Extremities: No lower extremity edema. No clubbing or deformities. Neuro: Alert and oriented x 3.  Grossly intact. Skin: Warm and dry, no jaundice.   Psych: Alert and cooperative, normal mood and affect.  Labs:    Imaging Studies: MR HIP RIGHT WO  CONTRAST  Result Date: 09/04/2022 CLINICAL DATA:  Chronic right hip pain EXAM: MR OF THE RIGHT HIP WITHOUT CONTRAST TECHNIQUE: Multiplanar, multisequence MR imaging was performed. No intravenous contrast was administered. COMPARISON:  X-ray 06/29/2022 FINDINGS: Bones: No acute fracture. No dislocation. No femoral head avascular necrosis. Bony pelvis intact without diastasis. SI joints and pubic symphysis within normal limits. No bone marrow edema. No marrow replacing bone lesion. Articular cartilage and labrum Articular cartilage: Mild cartilage thinning with small subchondral cystic changes at the anterosuperior aspect of the right acetabulum. Labrum: Grossly intact, although evaluation is limited in the absence of intra-articular fluid or contrast. No paralabral cyst. Joint or bursal effusion Joint effusion:  None. Bursae: Trace left iliopsoas bursal fluid. No abnormal right-sided bursal fluid collection. Muscles and tendons Muscles and tendons: Mild tendinosis of the bilateral hamstring tendon origins. The gluteal, iliopsoas, rectus femoris, and adductor tendons appear intact without tear or significant tendinosis. Normal muscle bulk and signal intensity without edema, atrophy, or fatty infiltration. Other findings Miscellaneous: No soft tissue edema or fluid collection. No inguinal lymphadenopathy. Colonic diverticulosis. Small left adnexal cysts measuring up to 1.8 cm in size. No follow-up imaging recommended. Note: This recommendation does not apply to premenarchal patients and to those with increased risk (genetic, family history, elevated tumor markers or other high-risk factors) of ovarian cancer. Reference: JACR 2020 Feb; 17(2):248-254 IMPRESSION: 1. Mild right hip osteoarthritis. 2. Mild tendinosis of the bilateral hamstring tendon origins. 3. Trace left iliopsoas bursal fluid. 4. Colonic diverticulosis. Electronically Signed   By: Duanne Guess D.O.   On: 09/04/2022 14:51   MR Lumbar Spine Wo  Contrast  Result Date: 09/03/2022 CLINICAL DATA:  Lumbar radiculopathy, symptoms persist with > 6 wks treatment. Chronic low back and right lower extremity pain. EXAM: MRI LUMBAR SPINE WITHOUT CONTRAST TECHNIQUE: Multiplanar, multisequence MR imaging of the lumbar spine was performed. No intravenous contrast was administered. COMPARISON:  Lumbar spine radiographs 06/29/2022 FINDINGS: Segmentation:  5 lumbar vertebrae. Alignment: Minimal retrolisthesis of L2 on L3, L3 on L4, and L4 on L5. Vertebrae: No fracture, suspicious marrow lesion, or significant marrow edema. Conus medullaris and cauda equina: Conus extends to the L1 level. Conus and cauda equina appear normal. Paraspinal and other soft tissues: Unremarkable. Disc levels: T12-L1: Negative. L1-2: Mild disc bulging without stenosis. L2-3: Disc desiccation and moderate disc space narrowing. Retrolisthesis with bulging uncovered disc and mild facet hypertrophy result in mild-to-moderate bilateral lateral recess stenosis without significant spinal or neural foraminal stenosis. L3-4: Disc desiccation and mild disc space narrowing. Retrolisthesis with bulging uncovered disc and mild facet hypertrophy result in mild right and mild-to-moderate left lateral recess stenosis and mild bilateral neural foraminal stenosis without spinal stenosis. L4-5: Disc desiccation and mild disc space narrowing. Retrolisthesis with bulging uncovered disc and moderate facet and ligamentum flavum hypertrophy result in mild-to-moderate bilateral lateral recess stenosis and moderate right  and mild left neural foraminal stenosis without spinal stenosis. L5-S1: Moderate left greater than right facet hypertrophy without disc herniation or stenosis. IMPRESSION: Multilevel lumbar disc and facet degeneration resulting in mild-to-moderate lateral recess and neural foraminal stenosis as above. Electronically Signed   By: Sebastian Ache M.D.   On: 09/03/2022 16:54    Assessment and Plan:    Norma Wall is a 52 y.o. y/o female chronic right-sided abdominal pain in the right upper quadrant that ended up causing her to have her gallbladder out but she continues to have the discomfort.  She states this has been going on for 10 years and is not reproducible with muscle flexion today.  The patient also has left side abdominal pain that she states is better after she moves her bowels.  The patient will be started on a trial of Linzess and has been given samples of the 72 mcg and 145 mcg to try to see which facilitates her bowel movements and will let me know if her left side abdominal pain does not improve with more frequent bowel movements.  The patient has been explained the plan and agrees with it.     Midge Minium, MD. Clementeen Graham    Note: This dictation was prepared with Dragon dictation along with smaller phrase technology. Any transcriptional errors that result from this process are unintentional.

## 2022-09-27 ENCOUNTER — Other Ambulatory Visit: Payer: Self-pay | Admitting: Family Medicine

## 2022-09-27 DIAGNOSIS — Z1231 Encounter for screening mammogram for malignant neoplasm of breast: Secondary | ICD-10-CM

## 2022-09-28 ENCOUNTER — Ambulatory Visit: Payer: Federal, State, Local not specified - PPO

## 2022-09-28 ENCOUNTER — Encounter: Payer: Self-pay | Admitting: Gastroenterology

## 2022-09-28 DIAGNOSIS — M4727 Other spondylosis with radiculopathy, lumbosacral region: Secondary | ICD-10-CM | POA: Diagnosis not present

## 2022-09-28 DIAGNOSIS — M5416 Radiculopathy, lumbar region: Secondary | ICD-10-CM | POA: Diagnosis not present

## 2022-09-28 DIAGNOSIS — M545 Low back pain, unspecified: Secondary | ICD-10-CM

## 2022-09-28 DIAGNOSIS — M25551 Pain in right hip: Secondary | ICD-10-CM | POA: Diagnosis not present

## 2022-09-28 DIAGNOSIS — M5386 Other specified dorsopathies, lumbar region: Secondary | ICD-10-CM | POA: Diagnosis not present

## 2022-09-28 NOTE — Therapy (Signed)
OUTPATIENT PHYSICAL THERAPY  Treatment   Patient Name: Norma Wall MRN: [id number] DOB:04/06/1970, 52 y.o., female Today's Date: 09/28/2022  END OF SESSION:   PT End of Session - 09/28/22 0907     Visit Number 3    Number of Visits 12    Date for PT Re-Evaluation 10/28/22    Authorization - Number of Visits 50    Progress Note Due on Visit 10    PT Start Time 0907    PT Stop Time 0950    PT Time Calculation (min) 43 min    Activity Tolerance Patient tolerated treatment well               Past Medical History:  Diagnosis Date   Abdominal pain, right upper quadrant 2013   Anxiety    Cancer (HCC)    basal cells- [place]   COVID-19 2021   Endometriosis 01/2013   Gallbladder polyp    Lipidemia 2020   Lymphedema 01/2019   Migraine headache    3-4x/yr   Ovarian cyst 2014   right ovary   PONV (postoperative nausea and vomiting)    Vertigo    resolves quickly   Past Surgical History:  Procedure Laterality Date   ABLATION  2012   cyst removal   CHOLECYSTECTOMY N/A 10/19/2018   Procedure: LAPAROSCOPIC CHOLECYSTECTOMY;  Surgeon: Leafy Ro, MD;  Location: [facility];  Service: General;  Laterality: N/A;   COLONOSCOPY WITH PROPOFOL N/A 07/21/2020   Procedure: COLONOSCOPY WITH PROPOFOL;  Surgeon: Midge Minium, MD;  Location: [facility];  Service: Endoscopy;  Laterality: N/A;   CYSTOSCOPY N/A 09/25/2020   Procedure: CYSTOSCOPY;  Surgeon: Vena Austria, MD;  Location: [facility];  Service: Gynecology;  Laterality: N/A;   CYSTOSCOPY W/ URETERAL STENT PLACEMENT Right 09/25/2020   Procedure: CYSTOSCOPY WITH RETROGRADE PYELOGRAM/URETERAL STENT PLACEMENT;  Surgeon: Sondra Come, MD;  Location: [facility];  Service: Urology;  Laterality: Right;   LAPAROSCOPIC OVARIAN CYSTECTOMY Right 10/19/2018   Procedure: LAPAROSCOPIC RIGHT OVARIAN CYSTECTOMY;  Surgeon: Vena Austria, MD;  Location: [facility];  Service: Gynecology;  Laterality: Right;   NECK SURGERY   02/22/2018   Artificial Disc replacement    OVARIAN CYST REMOVAL     POLYPECTOMY N/A 07/21/2020   Procedure: POLYPECTOMY;  Surgeon: Midge Minium, MD;  Location: [facility];  Service: Endoscopy;  Laterality: N/A;   TOTAL LAPAROSCOPIC HYSTERECTOMY WITH SALPINGECTOMY Bilateral 09/25/2020   Procedure: TOTAL LAPAROSCOPIC HYSTERECTOMY WITH SALPINGECTOMY;  Surgeon: Vena Austria, MD;  Location: [facility];  Service: Gynecology;  Laterality: Bilateral;   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   Lipedema 01/10/2019   Endometriosis determined by laparoscopy 07/06/2018   Dermatitis, eczematoid 11/20/2014   External hemorrhoid 11/20/2014   Greater trochanteric pain syndrome of right lower extremity 11/20/2014   Migraine 08/28/2014   Gallbladder polyp 09/13/2012   Avitaminosis D 05/19/2009    PCP: Erasmo Downer, MD   REFERRING PROVIDER: Jerrol Banana, MD   REFERRING DIAG:  [phone] (ICD-10-CM) - Multilevel lumbosacral spondylosis with radiculopathy M25.551 (ICD-10-CM) -  Greater trochanteric pain syndrome of right lower extremity   Rationale for Evaluation and Treatment: Rehabilitation  THERAPY DIAG:  Lumbar pain  Radiculopathy, lumbar region  ONSET DATE: 9 months  SUBJECTIVE:                                                                                                                                                                                           SUBJECTIVE STATEMENT: Back is a little better than last week, still a little tight  on the R side. 1/10 L lateral thigh pain. L lateral knee bothered her last night.  Was in [place] this week end, plane ride and lifting boxes of foot for [employer]. Back did well overall. Today is a good day.    PERTINENT HISTORY:  Pt reports she has been having this discomfort in the back for years, but it comes in rounds. Pt notes this round has been going on for 9 months. Pt states the closest she got to complete relief was when she was given steroids and gabapentin. Pt states she would like to note have to take medicine for the rest of her life for the pain if possible. Pt notes she typically has the most pain at night when at rest. Pt states laying on either side would provoke the pain and then laying on her belly or back, it would relieve the pain.   PAIN:  Are you having pain? Yes: NPRS scale: 1/10 Pain location: low back Pain description: dull, achey Aggravating factors: laying on side, cross legs Relieving factors: steroid round,   PRECAUTIONS: None  RED FLAGS: None   WEIGHT BEARING RESTRICTIONS: No  FALLS:  Has patient fallen in last 6 months? Yes. Number of falls slipped in the mud  LIVING ENVIRONMENT: Lives with: lives with their family Lives in: House/apartment Stairs: Yes: Internal: 13 steps; on right going up and External: 3 steps; none Has following equipment at home: None  OCCUPATION: Former SLP, now working for [employer]  PLOF: Independent  PATIENT GOALS: to reduce the pain  NEXT MD VISIT: Not sure  OBJECTIVE:   DIAGNOSTIC FINDINGS:    EXAM: MRI LUMBAR SPINE WITHOUT CONTRAST  FINDINGS: Segmentation:  5 lumbar vertebrae.   Alignment: Minimal retrolisthesis of L2 on L3, L3 on L4, and L4 on L5.   Vertebrae: No fracture, suspicious marrow lesion, or significant marrow edema.   Conus medullaris and cauda equina: Conus extends to the L1 level. Conus and cauda equina appear normal.   Paraspinal and other soft tissues: Unremarkable.    Disc levels:   T12-L1: Negative.   L1-2: Mild disc  bulging without stenosis.   L2-3: Disc desiccation and moderate disc space narrowing. Retrolisthesis with bulging uncovered disc and mild facet hypertrophy result in mild-to-moderate bilateral lateral recess stenosis without significant spinal or neural foraminal stenosis.   L3-4: Disc desiccation and mild disc space narrowing. Retrolisthesis with bulging uncovered disc and mild facet hypertrophy result in mild right and mild-to-moderate left lateral recess stenosis and mild bilateral neural foraminal stenosis without spinal stenosis.   L4-5: Disc desiccation and mild disc space narrowing. Retrolisthesis with bulging uncovered disc and moderate facet and ligamentum flavum hypertrophy result in mild-to-moderate bilateral lateral recess stenosis and moderate right and mild left neural foraminal stenosis without spinal stenosis.   L5-S1: Moderate left greater than right facet hypertrophy without disc herniation or stenosis.   IMPRESSION: Multilevel lumbar disc and facet degeneration resulting in mild-to-moderate lateral recess and neural foraminal stenosis as above.    EXAM: MR OF THE RIGHT HIP WITHOUT CONTRAST  FINDINGS: Bones: No acute fracture. No dislocation. No femoral head avascular necrosis. Bony pelvis intact without diastasis. SI joints and pubic symphysis within normal limits. No bone marrow edema. No marrow replacing bone lesion.   Articular cartilage and labrum   Articular cartilage: Mild cartilage thinning with small subchondral cystic changes at the anterosuperior aspect of the right acetabulum.   Labrum: Grossly intact, although evaluation is limited in the absence of intra-articular fluid or contrast. No paralabral cyst.   Joint or bursal effusion   Joint effusion:  None.   Bursae: Trace left iliopsoas bursal fluid. No abnormal right-sided bursal fluid collection.   Muscles and tendons    Muscles and tendons: Mild tendinosis of the bilateral hamstring tendon origins. The gluteal, iliopsoas, rectus femoris, and adductor tendons appear intact without tear or significant tendinosis. Normal muscle bulk and signal intensity without edema, atrophy, or fatty infiltration.   Other findings   Miscellaneous: No soft tissue edema or fluid collection. No inguinal lymphadenopathy. Colonic diverticulosis. Small left adnexal cysts measuring up to 1.8 cm in size. No follow-up imaging recommended. Note: This recommendation does not apply to premenarchal patients and to those with increased risk (genetic, family history, elevated tumor markers or other high-risk factors) of ovarian cancer. Reference: JACR 2020 Feb; 17(2):248-254   IMPRESSION: 1. Mild right hip osteoarthritis. 2. Mild tendinosis of the bilateral hamstring tendon origins. 3. Trace left iliopsoas bursal fluid. 4. Colonic diverticulosis.     PATIENT SURVEYS:  FOTO 72    COGNITION: Overall cognitive status: Within functional limits for tasks assessed     SENSATION: WFL   POSTURE: No Significant postural limitations  PALPATION: Pt with some tightness noted in the piriformis   LUMBAR ROM:   AROM eval  Flexion 100  Extension 100  Right lateral flexion 100  Left lateral flexion 100  Right rotation 80  Left rotation 100   (Blank rows = not tested)   AROM 09/21/2022  Flexion Full with reproduction of central low back and B lumbar paraspinal pain  Extension Full with mild central low back pain  Right lateral flexion full  Left lateral flexion Full with R lumbar paraspinal muscle tightness  Right rotation 80, no symptoms  Left rotation 100, no symptoms.       LOWER EXTREMITY ROM:     Active  Right eval Left eval  Hip flexion    Hip extension    Hip abduction    Hip adduction    Hip internal rotation    Hip external rotation    Knee flexion  Knee extension    Ankle dorsiflexion     Ankle plantarflexion    Ankle inversion    Ankle eversion     (Blank rows = not tested)   LOWER EXTREMITY MMT:    MMT Right eval Left eval  Hip flexion 4 4-  Hip extension 4 4  Hip abduction 4 4-  Hip adduction 4- 4-  Hip internal rotation    Hip external rotation    Knee flexion 4- 4-  Knee extension 4- 4-  Ankle dorsiflexion 5 5  Ankle plantarflexion    Ankle inversion    Ankle eversion     (Blank rows = not tested)   LUMBAR SPECIAL TESTS:   Straight leg raise test: Negative, Slump test: Negative, Single leg stance test: Positive, Stork standing: Positive, SI Compression/distraction test: Negative, FABER test: Positive, and Trendelenburg sign: Positive  FUNCTIONAL TESTS:  N/A    TODAY'S TREATMENT: DATE: 09/28/22   No latex allergies   Manual therapy  Prone UPA to R L5, L4, L3, L2, L1 TP grade 3 to promote mobility and joint nutrition    Therapeutic exercises  Prone glute max set with transversus abdominis muscle activation  R 10x3 with 5 second holds L 10x3 with 5 second holds  Quadruped  Hip extension    R 10x   L 10x   L knee joint popping during L L weight bearing   Bent over on low mat table  Hip extension    R 10x5 seconds for 3 sets   L 10x5 seconds for 3 sets  Supine posterior pelvic tilt 10x10 seconds    Hooklying reverse crunch 10x2  Hooklying crunch, arms across chest 10x2  Standing forward flexion: low back tightness  Symptoms improved with transversus abdominis contraction.   Improved exercise technique, movement at target joints, use of target muscles after mod verbal, visual, tactile cues.    Pt tolerated session well without aggravation of symptoms.        PATIENT EDUCATION: Education details: There-ex, HEP   Person educated: Patient Education method: Explanation, demonstration, tactile cues Education comprehension: verbalized understanding, returned demonstration   HOME EXERCISE PROGRAM:  Access Code:  [email or web address] URL: [email or web address] Date: 09/21/2022 Prepared by: Loralyn Freshwater  Exercises - Supine Transversus Abdominis Bracing - Hands on Stomach  - 1 x daily - 7 x weekly - 3 sets - 10 reps - 5 seconds hold  - Hooklying Clamshell with Resistance  - 1 x daily - 7 x weekly - 3 sets - 10 reps  Yellow band  Sitting with B forearms propped onto thighs  Decreased symptoms   Then with transversus abdominis contraction 10x5 seconds    Decreased pain      FINDINGS: Bones: No acute fracture. No dislocation. No femoral head avascular necrosis. Bony pelvis intact without diastasis. SI joints and pubic symphysis within normal limits. No bone marrow edema. No marrow replacing bone lesion.   Articular cartilage and labrum   Articular cartilage: Mild cartilage thinning with small subchondral cystic changes at the anterosuperior aspect of the right acetabulum.   Labrum: Grossly intact, although evaluation is limited in the absence of intra-articular fluid or contrast. No paralabral cyst.   Joint or bursal effusion   Joint effusion:  None.   Bursae: Trace left iliopsoas bursal fluid. No abnormal right-sided bursal fluid collection.  Muscles and tendons   Muscles and tendons: Mild tendinosis of the bilateral hamstring tendon origins. The gluteal, iliopsoas, rectus femoris, and adductor tendons appear intact without  tear or significant tendinosis. Normal muscle bulk and signal intensity without edema, atrophy, or fatty infiltration.   Other findings   Miscellaneous: No soft tissue edema or fluid collection. No inguinal lymphadenopathy. Colonic diverticulosis. Small left adnexal cysts measuring up to 1.8 cm in size. No follow-up imaging recommended. Note: This recommendation does not apply to premenarchal patients and to those with increased risk (genetic, family history, elevated tumor markers or other high-risk factors) of ovarian cancer. Reference: JACR 2020  Feb; 17(2):248-254   IMPRESSION: 1. Mild right hip osteoarthritis. 2. Mild tendinosis of the bilateral hamstring tendon origins. 3. Trace left iliopsoas bursal fluid. 4. Colonic diverticulosis.    ASSESSMENT:  CLINICAL IMPRESSION:  Improving low back pain based on subjective reports. Worked on manual therapy to R low back to improve joint mobility and joint nutrition, followed by trunk and glute strengthening to help decrease stress to affected areas. Pt tolerated session well without aggravation of symptoms. Pt will benefit from continued skilled physical therapy services to decrease pain, improve strength and function.       OBJECTIVE IMPAIRMENTS: decreased ROM, decreased strength, and pain.   ACTIVITY LIMITATIONS: carrying, lifting, bending, and squatting  PARTICIPATION LIMITATIONS: cleaning, laundry, occupation, and yard work  PERSONAL FACTORS: Past/current experiences, Profession, Time since onset of injury/illness/exacerbation, and 1-2 comorbidities: anxiety, vertigo  are also affecting patient's functional outcome.   REHAB POTENTIAL: Good  CLINICAL DECISION MAKING: Stable/uncomplicated  EVALUATION COMPLEXITY: Low   GOALS: Goals reviewed with patient? Yes  SHORT TERM GOALS: Target date: 10/14/2022  Pt will be independent with HEP in order to demonstrate increased ability to perform tasks related to occupation/hobbies. Baseline:  Pt given HEP at initial evaluation Goal status: INITIAL  LONG TERM GOALS: Target date: 12/09/2022  Patient will demonstrate improved function as evidenced by a score of 74 on FOTO measure for full participation in activities at home and in the community. Baseline: 09/16/22: 72 Goal status: INITIAL  2.  Pt will be able to demonstrate proper hip hinge lift in order to be able to lift heavier items necessary at work with no pain Baseline: Pt reports increased pain with lifting heavier items at work. Goal status: INITIAL  3.  Pt will  improve lumbar rotation to the R to be 100% of the L which is necessary for tasks such as driving and performing work duties.  Baseline: Pt is at 80% of baseline levels to the R side. Goal status: INITIAL  4.  Pt to report no pain on NPRS following a full shift at work in order to demonstrate improved functional tasks necessary for jobs. Baseline: 1/10 sitting at rest.   Goal status: INITIAL  PLAN:  PT FREQUENCY: 1-2x/week  PT DURATION: 6 weeks  PLANNED INTERVENTIONS: Therapeutic exercises, Therapeutic activity, Neuromuscular re-education, Balance training, Gait training, Patient/Family education, Self Care, Joint mobilization, Joint manipulation, Vestibular training, Canalith repositioning, Aquatic Therapy, Dry Needling, Electrical stimulation, Spinal manipulation, Spinal mobilization, Cryotherapy, Moist heat, Taping, Ultrasound, Manual therapy, and Re-evaluation.  PLAN FOR NEXT SESSION: Assess compliance with HEP, continue assessment of the sacral/pelvic region as time allows.    Loralyn Freshwater PT, DPT Physical Therapist - [facility]  09/28/22, 12:28 PM

## 2022-10-01 MED ORDER — LUBIPROSTONE 24 MCG PO CAPS: 24.0000 ug | ORAL_CAPSULE | Freq: Two times a day (BID) | ORAL | 3 refills | Status: DC

## 2022-10-01 NOTE — Addendum Note (Signed)
Addended by: Roena Malady on: 10/01/2022 08:43 AM   Modules accepted: Orders

## 2022-10-12 ENCOUNTER — Ambulatory Visit: Payer: Federal, State, Local not specified - PPO

## 2022-10-17 ENCOUNTER — Other Ambulatory Visit: Payer: Self-pay | Admitting: Family Medicine

## 2022-10-17 DIAGNOSIS — G43709 Chronic migraine without aura, not intractable, without status migrainosus: Secondary | ICD-10-CM

## 2022-10-18 ENCOUNTER — Telehealth: Payer: Self-pay | Admitting: Family Medicine

## 2022-10-18 DIAGNOSIS — G43709 Chronic migraine without aura, not intractable, without status migrainosus: Secondary | ICD-10-CM

## 2022-10-18 NOTE — Telephone Encounter (Signed)
Refill requested by pharmacy on 10/17/22 in a separate refill encounter, medication refilled today in that encounter.

## 2022-10-18 NOTE — Telephone Encounter (Signed)
Requested Prescriptions  Pending Prescriptions Disp Refills   SUMAtriptan (IMITREX) 50 MG tablet [Pharmacy Med Name: SUMATRIPTAN 50MG  TABLETS] 10 tablet 2    Sig: TAKE 1 TABLET BY MOUTH EVERY 2 HOURS FOR MIGRAINE. MAY REPEAT IN 2 HOURS IF HEADACHE PERSISTS OR RECURS     Neurology:  Migraine Therapy - Triptan Passed - 10/17/2022 10:47 AM      Passed - Last BP in normal range    BP Readings from Last 1 Encounters:  09/23/22 124/84         Passed - Valid encounter within last 12 months    Recent Outpatient Visits           1 month ago Multilevel lumbosacral spondylosis with radiculopathy   [facility], Ocie Bob, MD   2 months ago Multilevel lumbosacral spondylosis with radiculopathy   [facility], Ocie Bob, MD   2 months ago Hot flashes   [facility] [place], Marzella Schlein, MD   3 months ago Arthralgia of right hip   [facility], Ocie Bob, MD   5 months ago Low libido   [facility] [place], Marzella Schlein, MD       Future Appointments             In 4 months Bacigalupo, Marzella Schlein, MD [facility]

## 2022-10-18 NOTE — Telephone Encounter (Signed)
Medication Refill - Medication: SUMAtriptan (IMITREX) 50 MG tablet   Pt stated she is out of her medication and asked if the office had samples she could pick up when advised of the 48-72 hour med refill policy.  Please advise.   Has the patient contacted their pharmacy? Yes.    (Agent: If yes, when and what did the pharmacy advise?)  Preferred Pharmacy (with phone number or street name):  [id number] [place], [place] - [address] AT [facility] [address] [place]  [address] [place] [address]  Phone: [phone] Fax: [phone]  Hours: Not open 24 hours   Has the patient been seen for an appointment in the last year OR does the patient have an upcoming appointment? Yes.    Agent: Please be advised that RX refills may take up to 3 business days. We ask that you follow-up with your pharmacy.

## 2022-10-19 ENCOUNTER — Encounter: Payer: Self-pay | Admitting: Podiatry

## 2022-10-19 ENCOUNTER — Ambulatory Visit: Payer: Federal, State, Local not specified - PPO | Admitting: Podiatry

## 2022-10-19 ENCOUNTER — Other Ambulatory Visit: Payer: Self-pay | Admitting: Family Medicine

## 2022-10-19 VITALS — BP 109/74 | HR 72

## 2022-10-19 DIAGNOSIS — L84 Corns and callosities: Secondary | ICD-10-CM | POA: Diagnosis not present

## 2022-10-19 DIAGNOSIS — M4727 Other spondylosis with radiculopathy, lumbosacral region: Secondary | ICD-10-CM

## 2022-10-19 NOTE — Progress Notes (Signed)
Chief Complaint  Patient presents with   Nail Problem    "I got this black line on my toe." N - black line on toenail L - hallux right D - 4 mos O - suddenly, about the same C - black line A - none T - none   Callouses    "I also want to ask about these calluses on the sides of my big toes." N - calluses L - hallux bilateral D - 10 years O - slowly gotten worse C - sore with certain shoes A - high heel shoes T - scrub when I get pedicures, file myself in the Winter    HPI: 52 y.o. female presenting today as a new patient for evaluation of 2 separate complaints.  First the patient has noticed over the last 4 months sudden onset of slight discoloration along the right hallux nail plate.  She would like to have it evaluated.  She says it has been present for about 4 months.  Nontender.  Denies a history of injury  Patient also states that she developed asymptomatic calluses to the bilateral great toes.  She says they are not painful but she would like to have them evaluated.  She does admit to walking around the house barefoot.  Past Medical History:  Diagnosis Date   Abdominal pain, right upper quadrant 2013   Anxiety    Cancer (HCC)    basal cells- [place]   COVID-19 2021   Endometriosis 01/2013   Gallbladder polyp    Lipidemia 2020   Lymphedema 01/2019   Migraine headache    3-4x/yr   Ovarian cyst 2014   right ovary   PONV (postoperative nausea and vomiting)    Vertigo    resolves quickly    Past Surgical History:  Procedure Laterality Date   ABLATION  2012   cyst removal   CHOLECYSTECTOMY N/A 10/19/2018   Procedure: LAPAROSCOPIC CHOLECYSTECTOMY;  Surgeon: Leafy Ro, MD;  Location: [facility];  Service: General;  Laterality: N/A;   COLONOSCOPY WITH PROPOFOL N/A 07/21/2020   Procedure: COLONOSCOPY WITH PROPOFOL;  Surgeon: Midge Minium, MD;  Location: [facility];  Service: Endoscopy;  Laterality: N/A;   CYSTOSCOPY N/A 09/25/2020   Procedure: CYSTOSCOPY;   Surgeon: Vena Austria, MD;  Location: [facility];  Service: Gynecology;  Laterality: N/A;   CYSTOSCOPY W/ URETERAL STENT PLACEMENT Right 09/25/2020   Procedure: CYSTOSCOPY WITH RETROGRADE PYELOGRAM/URETERAL STENT PLACEMENT;  Surgeon: Sondra Come, MD;  Location: [facility];  Service: Urology;  Laterality: Right;   LAPAROSCOPIC OVARIAN CYSTECTOMY Right 10/19/2018   Procedure: LAPAROSCOPIC RIGHT OVARIAN CYSTECTOMY;  Surgeon: Vena Austria, MD;  Location: [facility];  Service: Gynecology;  Laterality: Right;   NECK SURGERY  02/22/2018   Artificial Disc replacement    OVARIAN CYST REMOVAL     POLYPECTOMY N/A 07/21/2020   Procedure: POLYPECTOMY;  Surgeon: Midge Minium, MD;  Location: [facility];  Service: Endoscopy;  Laterality: N/A;   TOTAL LAPAROSCOPIC HYSTERECTOMY WITH SALPINGECTOMY Bilateral 09/25/2020   Procedure: TOTAL LAPAROSCOPIC HYSTERECTOMY WITH SALPINGECTOMY;  Surgeon: Vena Austria, MD;  Location: [facility];  Service: Gynecology;  Laterality: Bilateral;    No Known Allergies    RT great toe 10/19/2022  Physical Exam: General: The patient is alert and oriented x3 in no acute distress.  Dermatology: Skin is warm, dry and supple bilateral lower extremities.  There is a very subtle slight discoloration longitudinally along the central portion of the right hallux nail plate.  This  does not appear to be pigmented.  Clinically there does not seem to be any concern for malignant lesion.  Plan to observe for now  Hyperkeratotic callus also noted to the medial aspect of the bilateral great toes  Vascular: Palpable pedal pulses bilaterally. Capillary refill within normal limits.  No appreciable edema.  No erythema.  Neurological: Grossly intact via light touch  Musculoskeletal Exam: No pedal deformities noted   Assessment/Plan of Care: 1.  Slight longitudinal discoloration along the central portion of the right hallux nail plate 2.  Callus plantar medial aspect of the  bilateral feet  -Patient evaluated -In regards to the right hallux nail plate, simply observe for now.  Recommend that the patient sends a picture via MyChart in 6 months of her right great toenail plate -Explained that the calluses to the plantar medial aspect of bilateral great toes is secondary to barefoot walking -Advised against going barefoot -Return to clinic as needed callus     Felecia Shelling, DPM [facility]  Dr. Felecia Shelling, DPM    [address] [place], [place] [address]                Office [phone]  Fax [phone]

## 2022-10-20 ENCOUNTER — Ambulatory Visit: Payer: Self-pay | Admitting: *Deleted

## 2022-10-20 ENCOUNTER — Telehealth: Payer: Self-pay | Admitting: Family Medicine

## 2022-10-20 DIAGNOSIS — G43709 Chronic migraine without aura, not intractable, without status migrainosus: Secondary | ICD-10-CM

## 2022-10-20 MED ORDER — SUMATRIPTAN SUCCINATE 50 MG PO TABS: 50.0000 mg | ORAL_TABLET | ORAL | 2 refills | Status: DC

## 2022-10-20 NOTE — Telephone Encounter (Signed)
Medication Refill - Medication:  SUMAtriptan (IMITREX) 50 MG tablet  Has the patient contacted their pharmacy? Yes, spoke with Pharmacy today on 10/20/2022, the pharmacy does not have this prescription, stated she has a "closed prescription" with no refills.  Preferred Pharmacy (with phone number or street name):  [id number] - [place],  - [address] AT [facility] [address] [facility]  Phone: [phone] Fax: [phone]    Has the patient been seen for an appointment in the last year OR does the patient have an upcoming appointment? Yes

## 2022-10-20 NOTE — Telephone Encounter (Signed)
  Chief Complaint: [facility] in [place] is needing a new rx for the Imitrex 50 mg.   They never received the rx sent on 10/18/2022 for #10 with 2 refills.   They are showing no refills and that the rx is "closed".    She has been out of this for 3 days. Symptoms: Bad migraine for 3 days because unable to get the prescription from [facility]. Frequency: For 3 days has had a migraine and been unable to get rx from [facility] because they keep telling her they don't have an rx for it and that there is no refills and that it is "closed".    Pertinent Negatives: Patient denies N/A Disposition: [] ED /[] Urgent Care (no appt availability in office) / [] Appointment(In office/virtual)/ []  [place] [facility]/ [] Home Care/ [] Refused Recommended Disposition /[]  Mobile Bus/ [x]  Follow-up with PCP Additional Notes: I called the [facility] and they told me they never got the rx that was sent on 10/18/2022.   Also that this rx is "closed" so they would need a new rx for the Imitrex 50 mg from Dr. Beryle Flock.     I have sent a message to Dr. Beryle Flock requesting the rx be sent again.  I attempted to call the pt and let her know the status but got her voicemail.   Left a general message that her rx is being worked on.

## 2022-10-20 NOTE — Telephone Encounter (Signed)
Refilled

## 2022-10-20 NOTE — Telephone Encounter (Signed)
Medication sent today in a separate refill encounter, indicates received by pharmacy.

## 2022-10-20 NOTE — Telephone Encounter (Signed)
Last RF 09/07/22 #30 1 RF  Requested Prescriptions  Refused Prescriptions Disp Refills   gabapentin (NEURONTIN) 300 MG capsule [Pharmacy Med Name: GABAPENTIN 300MG  CAPSULES] 30 capsule 1    Sig: TAKE 1 CAPSULE(300 MG) BY MOUTH AT BEDTIME     Neurology: Anticonvulsants - gabapentin Passed - 10/19/2022 10:31 AM      Passed - Cr in normal range and within 360 days    Creatinine  Date Value Ref Range Status  01/09/2013 0.64 0.60 - 1.30 mg/dL Final   Creatinine, Ser  Date Value Ref Range Status  03/01/2022 0.84 0.57 - 1.00 mg/dL Final         Passed - Completed PHQ-2 or PHQ-9 in the last 360 days      Passed - Valid encounter within last 12 months    Recent Outpatient Visits           1 month ago Multilevel lumbosacral spondylosis with radiculopathy   [facility] [place], Ocie Bob, MD   2 months ago Multilevel lumbosacral spondylosis with radiculopathy   [facility] [place], Ocie Bob, MD   2 months ago Hot flashes   [facility] [place], Marzella Schlein, MD   3 months ago Arthralgia of right hip   [facility], Ocie Bob, MD   5 months ago Low libido   [facility] [place], Marzella Schlein, MD       Future Appointments             In 4 months Bacigalupo, Marzella Schlein, MD [facility]

## 2022-10-20 NOTE — Telephone Encounter (Signed)
Message from Callao M sent at 10/20/2022  2:10 PM EDT  Summary: migraine for 3 days   Pt is cailing to f/u on her medication refill SUMAtriptan (IMITREX) 50 MG tablet stated she has been having a migraine for 3 days and [facility] advised her they don't have her medication. Stated the pharmacy asked that the office call the number below so they can tell us that they don't have her Rx. # [phone]  Seeking clinical advice.          Call History  Contact Date/Time Type Contact Phone/Fax User  10/20/2022 02:07 PM EDT Phone (Incoming) Norma Wall, Norma Wall (Self) [phone] Rexene Edison) McGill, [name]   Reason for Disposition  [1] Caller has URGENT medicine question about med that PCP or specialist prescribed AND [2] triager unable to answer question    [facility] in [place] never received the rx sent on 10/18/2022 for #10, 2 refills.   There are no refills and they are showing this rx as "closed".  New rx needs to be sent to [facility] in [place].  Answer Assessment - Initial Assessment Questions 1. NAME of MEDICINE: "What medicine(s) are you calling about?"     Imitrex 50 mg 2. QUESTION: "What is your question?" (e.g., double dose of medicine, side effect)     It is not at the [facility] in [place] even though it was sent on 10/18/2022 #10 with 2 refills. 3. PRESCRIBER: "Who prescribed the medicine?" Reason: if prescribed by specialist, call should be referred to that group.     Dr. Beryle Flock 4. SYMPTOMS: "Do you have any symptoms?" If Yes, ask: "What symptoms are you having?"  "How bad are the symptoms (e.g., mild, moderate, severe)     Bad migraine for 3 days because out of Imitrex and has not been able to get it from the pharmacy because they keep telling her they don't have an rx for it. 5. PREGNANCY:  "Is there any chance that you are pregnant?" "When was your last menstrual period?"     Not asked  I called the [facility] at [phone] and asked about the Imitrex  50 mg.   They are showing no refills for this and that the rx is closed.   They never received the rx sent on 10/18/2022.   They are needing a new rx.   I've sent a message to Dr. Beryle Flock requesting a new rx for the Imitrex 50 mg be sent to the pharmacy.  Protocols used: Medication Question Call-A-AH

## 2022-10-24 DIAGNOSIS — M899 Disorder of bone, unspecified: Secondary | ICD-10-CM | POA: Insufficient documentation

## 2022-10-24 DIAGNOSIS — Z789 Other specified health status: Secondary | ICD-10-CM | POA: Insufficient documentation

## 2022-10-24 DIAGNOSIS — R937 Abnormal findings on diagnostic imaging of other parts of musculoskeletal system: Secondary | ICD-10-CM | POA: Insufficient documentation

## 2022-10-24 DIAGNOSIS — G894 Chronic pain syndrome: Secondary | ICD-10-CM | POA: Insufficient documentation

## 2022-10-24 DIAGNOSIS — R935 Abnormal findings on diagnostic imaging of other abdominal regions, including retroperitoneum: Secondary | ICD-10-CM | POA: Insufficient documentation

## 2022-10-24 DIAGNOSIS — Z79899 Other long term (current) drug therapy: Secondary | ICD-10-CM | POA: Insufficient documentation

## 2022-10-24 NOTE — Patient Instructions (Signed)

## 2022-10-24 NOTE — Progress Notes (Unsigned)
Patient: Norma Wall  Service Category: E/M  Provider: Oswaldo Done, MD  DOB: 1970/05/22  DOS: 10/25/2022  Referring Provider: Jerrol Banana, MD  MRN: [id number]  Setting: Ambulatory outpatient  PCP: Erasmo Downer, MD  Type: New Patient  Specialty: Interventional Pain Management    Location: Office  Delivery: Face-to-face     Primary Reason(s) for Visit: Encounter for initial evaluation of one or more chronic problems (new to examiner) potentially causing chronic pain, and posing a threat to normal musculoskeletal function. (Level of risk: High) CC: No chief complaint on file.  HPI  [name] is a 52 y.o. year old, female patient, who comes for the first time to our practice referred by Ashley Royalty Ocie Bob, MD for our initial evaluation of her chronic pain. She has Gallbladder polyp; Migraine; Dermatitis, eczematoid; External hemorrhoid; Avitaminosis D; Endometriosis determined by laparoscopy; Eagle's syndrome; Lipedema; Lymphedema; Polyp of ascending colon; Pelvic pain; S/P laparoscopic hysterectomy; Hematuria; Left lower quadrant abdominal pain; Right upper quadrant abdominal pain; Low libido; Hot flashes; Arthralgia of right hip; Greater trochanteric pain syndrome of right lower extremity; Multilevel lumbosacral spondylosis with radiculopathy; Abnormal MRI, Hip (Right) (09/04/2022); Abnormal MRI, lumbar spine (09/03/2022); Chronic pain syndrome; Pharmacologic therapy; Disorder of skeletal system; Problems influencing health status; and Abnormal MRI, cervical spine (01/18/2018) on their problem list. Today she comes in for evaluation of her No chief complaint on file.  Pain Assessment: Location:     Radiating:   Onset:   Duration:   Quality:   Severity:  /10 (subjective, self-reported pain score)  Effect on ADL:   Timing:   Modifying factors:   BP:    HR:    Onset and Duration: {Hx; Onset and Duration:210120511} Cause of pain: {Hx; Cause:210120521} Severity: {Pain  Severity:210120502} Timing: {Symptoms; Timing:210120501} Aggravating Factors: {Causes; Aggravating pain factors:210120507} Alleviating Factors: {Causes; Alleviating Factors:210120500} Associated Problems: {Hx; Associated problems:210120515} Quality of Pain: {Hx; Symptom quality or Descriptor:210120531} Previous Examinations or Tests: {Hx; Previous examinations or test:210120529} Previous Treatments: {Hx; Previous Treatment:210120503}  [name] is being evaluated for possible interventional pain management therapies for the treatment of her chronic pain.   ***  [name] has been informed that this initial visit was an evaluation only.  On the follow up appointment I will go over the results, including ordered tests and available interventional therapies. At that time she will have the opportunity to decide whether to proceed with offered therapies or not. In the event that [name] prefers avoiding interventional options, this will conclude our involvement in the case.  Medication management recommendations may be provided upon request.  Patient informed that diagnostic tests may be ordered to assist in identifying underlying causes, narrow the list of differential diagnoses and aid in determining candidacy for (or contraindications to) planned therapeutic interventions.  Historic Controlled Substance Pharmacotherapy Review  PMP and historical list of controlled substances: ***  Most recently prescribed opioid analgesics:   *** MME/day: *** mg/day  Historical Monitoring: The patient  reports no history of drug use. List of prior UDS Testing: No results found for: "MDMA", "COCAINSCRNUR", "PCPSCRNUR", "PCPQUANT", "CANNABQUANT", "THCU", "ETH", "CBDTHCR", "D8THCCBX", "D9THCCBX" Historical Background Evaluation: [place] PMP: PDMP reviewed during this encounter. Review of the past 45-months conducted.             PMP NARX Score Report:  Narcotic: 000 Sedative: 000 Stimulant: 000 [place] Department of  public safety, offender search: Engineer, mining Information) Non-contributory Risk Assessment Profile: Aberrant behavior: None observed or detected today Risk factors  for fatal opioid overdose: None identified today PMP NARX Overdose Risk Score: 260 Fatal overdose hazard ratio (HR): Calculation deferred Non-fatal overdose hazard ratio (HR): Calculation deferred Risk of opioid abuse or dependence: 0.7-3.0% with doses <= 36 MME/day and 6.1-26% with doses >= 120 MME/day. Substance use disorder (SUD) risk level: See below Personal History of Substance Abuse (SUD-Substance use disorder):  Alcohol:    Illegal Drugs:    Rx Drugs:    ORT Risk Level calculation:    ORT Scoring interpretation table:  Score <3 = Low Risk for SUD  Score between 4-7 = Moderate Risk for SUD  Score >8 = High Risk for Opioid Abuse   PHQ-2 Depression Scale:  Total score:    PHQ-2 Scoring interpretation table: (Score and probability of major depressive disorder)  Score 0 = No depression  Score 1 = 15.4% Probability  Score 2 = 21.1% Probability  Score 3 = 38.4% Probability  Score 4 = 45.5% Probability  Score 5 = 56.4% Probability  Score 6 = 78.6% Probability   PHQ-9 Depression Scale:  Total score:    PHQ-9 Scoring interpretation table:  Score 0-4 = No depression  Score 5-9 = Mild depression  Score 10-14 = Moderate depression  Score 15-19 = Moderately severe depression  Score 20-27 = Severe depression (2.4 times higher risk of SUD and 2.89 times higher risk of overuse)   Pharmacologic Plan: As per protocol, I have not taken over any controlled substance management, pending the results of ordered tests and/or consults.            Initial impression: Pending review of available data and ordered tests.  Meds   Current Outpatient Medications:    acetaminophen (TYLENOL) 500 MG tablet, Take 1,000 mg by mouth every 6 (six) hours as needed for moderate pain, fever or headache., Disp: , Rfl:    Calcium Carbonate (CALCIUM  500 PO), Take by mouth., Disp: , Rfl:    Cholecalciferol (VITAMIN D3) 50 MCG (2000 UT) CAPS, Take by mouth., Disp: , Rfl:    diclofenac (VOLTAREN) 75 MG EC tablet, Take 1 tablet (75 mg total) by mouth 2 (two) times daily as needed., Disp: 60 tablet, Rfl: 1   gabapentin (NEURONTIN) 300 MG capsule, Take 1 capsule (300 mg total) by mouth at bedtime as needed., Disp: 30 capsule, Rfl: 1   ibuprofen (ADVIL) 200 MG tablet, Take 200 mg by mouth as needed., Disp: , Rfl:    lubiprostone (AMITIZA) 24 MCG capsule, Take 1 capsule (24 mcg total) by mouth 2 (two) times daily with a meal., Disp: 60 capsule, Rfl: 3   SUMAtriptan (IMITREX) 50 MG tablet, Take 1 tablet (50 mg total) by mouth every 2 (two) hours. May repeat in 2 hours if headache persists or recurs., Disp: 10 tablet, Rfl: 2  Imaging Review  Cervical Imaging: Cervical DG 2-3 views: Results for orders placed during the hospital encounter of 01/11/18 DG Cervical Spine 2-3 Views  Narrative CLINICAL DATA:  Neck pain for 1 week.  No known injury.  EXAM: CERVICAL SPINE - 2-3 VIEW  COMPARISON:  None.  FINDINGS: There is no evidence of cervical spine fracture or prevertebral soft tissue swelling. Alignment is normal. No other significant bone abnormalities are identified.  IMPRESSION: Negative cervical spine radiographs.   Electronically Signed By: Drusilla Kanner M.D. On: 01/11/2018 09:50  Cervical DG complete: Results for orders placed during the hospital encounter of 02/26/18 DG Cervical Spine Complete  Narrative CLINICAL DATA:  Neck pain. Status post  anterior cervical discectomy and fusion 4 days ago. Dysphagia. Nausea and dizziness.  EXAM: CERVICAL SPINE - COMPLETE 4+ VIEW  COMPARISON:  Cervical spine MR dated 01/18/2018.  FINDINGS: Interval interbody metallic spacer at the C6-7 level with normal alignment. Minimal anterior spur formation at the C3-4 and C5-6 levels. Uncinate spurs producing mild foraminal stenosis on  the right at the C3-4, C4-5 and C5-6 levels. The neural foramina on the left are more difficult to assess due to lack of sufficient obliquity. No prevertebral soft tissue swelling or gas.  IMPRESSION: Postoperative and degenerative changes, as described above. No prevertebral soft tissue swelling.   Electronically Signed By: Beckie Salts M.D. On: 02/26/2018 16:17  Lumbosacral Imaging: Lumbar MR wo contrast: Results for orders placed during the hospital encounter of 08/25/22 MR Lumbar Spine Wo Contrast  Narrative CLINICAL DATA:  Lumbar radiculopathy, symptoms persist with > 6 wks treatment. Chronic low back and right lower extremity pain.  EXAM: MRI LUMBAR SPINE WITHOUT CONTRAST  TECHNIQUE: Multiplanar, multisequence MR imaging of the lumbar spine was performed. No intravenous contrast was administered.  COMPARISON:  Lumbar spine radiographs 06/29/2022  FINDINGS: Segmentation:  5 lumbar vertebrae.  Alignment: Minimal retrolisthesis of L2 on L3, L3 on L4, and L4 on L5.  Vertebrae: No fracture, suspicious marrow lesion, or significant marrow edema.  Conus medullaris and cauda equina: Conus extends to the L1 level. Conus and cauda equina appear normal.  Paraspinal and other soft tissues: Unremarkable.  Disc levels:  T12-L1: Negative.  L1-2: Mild disc bulging without stenosis.  L2-3: Disc desiccation and moderate disc space narrowing. Retrolisthesis with bulging uncovered disc and mild facet hypertrophy result in mild-to-moderate bilateral lateral recess stenosis without significant spinal or neural foraminal stenosis.  L3-4: Disc desiccation and mild disc space narrowing. Retrolisthesis with bulging uncovered disc and mild facet hypertrophy result in mild right and mild-to-moderate left lateral recess stenosis and mild bilateral neural foraminal stenosis without spinal stenosis.  L4-5: Disc desiccation and mild disc space narrowing. Retrolisthesis with  bulging uncovered disc and moderate facet and ligamentum flavum hypertrophy result in mild-to-moderate bilateral lateral recess stenosis and moderate right and mild left neural foraminal stenosis without spinal stenosis.  L5-S1: Moderate left greater than right facet hypertrophy without disc herniation or stenosis.  IMPRESSION: Multilevel lumbar disc and facet degeneration resulting in mild-to-moderate lateral recess and neural foraminal stenosis as above.   Electronically Signed By: Sebastian Ache M.D. On: 09/03/2022 16:54  Lumbar DG (Complete) 4+V: Results for orders placed during the hospital encounter of 06/29/22 DG Lumbar Spine Complete  Narrative CLINICAL DATA:  Pain  EXAM: LUMBAR SPINE - COMPLETE 4+ VIEW  COMPARISON:  None Available.  FINDINGS: Straightening of normal lordosis. No other malalignment. No fracture. Multilevel degenerative disc disease with anterior osteophytes most prominent at L2-3 and L3-4 without loss of disc height. Suspected mild lower lumbar facet degenerative changes. No other abnormalities.  IMPRESSION: Degenerative changes as above.   Electronically Signed By: Gerome Sam III M.D. On: 06/30/2022 14:08  Hip Imaging: Hip-R MR wo contrast: Results for orders placed during the hospital encounter of 08/25/22 MR HIP RIGHT WO CONTRAST  Narrative CLINICAL DATA:  Chronic right hip pain  EXAM: MR OF THE RIGHT HIP WITHOUT CONTRAST  TECHNIQUE: Multiplanar, multisequence MR imaging was performed. No intravenous contrast was administered.  COMPARISON:  X-ray 06/29/2022  FINDINGS: Bones: No acute fracture. No dislocation. No femoral head avascular necrosis. Bony pelvis intact without diastasis. SI joints and pubic symphysis within normal limits. No  bone marrow edema. No marrow replacing bone lesion.  Articular cartilage and labrum  Articular cartilage: Mild cartilage thinning with small subchondral cystic changes at the  anterosuperior aspect of the right acetabulum.  Labrum: Grossly intact, although evaluation is limited in the absence of intra-articular fluid or contrast. No paralabral cyst.  Joint or bursal effusion  Joint effusion:  None.  Bursae: Trace left iliopsoas bursal fluid. No abnormal right-sided bursal fluid collection.  Muscles and tendons  Muscles and tendons: Mild tendinosis of the bilateral hamstring tendon origins. The gluteal, iliopsoas, rectus femoris, and adductor tendons appear intact without tear or significant tendinosis. Normal muscle bulk and signal intensity without edema, atrophy, or fatty infiltration.  Other findings  Miscellaneous: No soft tissue edema or fluid collection. No inguinal lymphadenopathy. Colonic diverticulosis. Small left adnexal cysts measuring up to 1.8 cm in size. No follow-up imaging recommended. Note: This recommendation does not apply to premenarchal patients and to those with increased risk (genetic, family history, elevated tumor markers or other high-risk factors) of ovarian cancer. Reference: JACR 2020 Feb; 17(2):248-254  IMPRESSION: 1. Mild right hip osteoarthritis. 2. Mild tendinosis of the bilateral hamstring tendon origins. 3. Trace left iliopsoas bursal fluid. 4. Colonic diverticulosis.   Electronically Signed By: Duanne Guess D.O. On: 09/04/2022 14:51  Hip-R DG 2-3 views: Results for orders placed during the hospital encounter of 06/29/22 DG Hip Unilat W OR W/O Pelvis 2-3 Views Right  Narrative CLINICAL DATA:  Pain  EXAM: DG HIP (WITH OR WITHOUT PELVIS) 2-3V RIGHT  COMPARISON:  None Available.  FINDINGS: There is no evidence of hip fracture or dislocation. There is no evidence of arthropathy or other focal bone abnormality.  IMPRESSION: Negative.   Electronically Signed By: Gerome Sam III M.D. On: 06/30/2022 14:09  Complexity Note: Imaging results reviewed.                         ROS   Cardiovascular: {Hx; Cardiovascular History:210120525} Pulmonary or Respiratory: {Hx; Pumonary and/or Respiratory History:210120523} Neurological: {Hx; Neurological:210120504} Psychological-Psychiatric: {Hx; Psychological-Psychiatric History:210120512} Gastrointestinal: {Hx; Gastrointestinal:210120527} Genitourinary: {Hx; Genitourinary:210120506} Hematological: {Hx; Hematological:210120510} Endocrine: {Hx; Endocrine history:210120509} Rheumatologic: {Hx; Rheumatological:210120530} Musculoskeletal: {Hx; Musculoskeletal:210120528} Work History: {Hx; Work history:210120514}  Allergies  Ms. Ellers has No Known Allergies.  Laboratory Chemistry Profile   Renal Lab Results  Component Value Date   BUN 11 03/01/2022   CREATININE 0.84 03/01/2022   BCR 13 03/01/2022   GFRAA 89 01/11/2020   GFRNONAA >60 09/26/2020   SPECGRAV 1.010 03/01/2022   PHUR 5.0 03/01/2022   PROTEINUR Positive (A) 03/01/2022     Electrolytes Lab Results  Component Value Date   NA 138 03/01/2022   K 4.4 03/01/2022   CL 102 03/01/2022   CALCIUM 9.3 03/01/2022     Hepatic Lab Results  Component Value Date   AST 17 03/01/2022   ALT 20 03/01/2022   ALBUMIN 4.2 03/01/2022   ALKPHOS 49 03/01/2022   AMYLASE 76 03/02/2016   LIPASE 28 03/01/2022     ID Lab Results  Component Value Date   HIV Non Reactive 09/13/2016   SARSCOV2NAA NEGATIVE 01/19/2022   PREGTESTUR NEGATIVE 09/25/2020     Bone Lab Results  Component Value Date   VD25OH 25.1 (L) 03/01/2022     Endocrine Lab Results  Component Value Date   GLUCOSE 82 03/01/2022   GLUCOSEU Negative 10/02/2020   HGBA1C 5.0 02/27/2021   TSH 2.760 01/11/2020     Neuropathy Lab Results  Component Value  Date   HGBA1C 5.0 02/27/2021   HIV Non Reactive 09/13/2016     CNS No results found for: "COLORCSF", "APPEARCSF", "RBCCOUNTCSF", "WBCCSF", "POLYSCSF", "LYMPHSCSF", "EOSCSF", "PROTEINCSF", "GLUCCSF", "JCVIRUS", "CSFOLI", "IGGCSF", "LABACHR", "ACETBL"    Inflammation (CRP: Acute  ESR: Chronic) No results found for: "CRP", "ESRSEDRATE", "LATICACIDVEN"   Rheumatology No results found for: "RF", "ANA", "LABURIC", "URICUR", "LYMEIGGIGMAB", "LYMEABIGMQN", "HLAB27"   Coagulation Lab Results  Component Value Date   PLT 186 03/01/2022     Cardiovascular Lab Results  Component Value Date   TROPONINI <0.03 02/26/2018   HGB 13.6 03/01/2022   HCT 42.6 03/01/2022     Screening Lab Results  Component Value Date   SARSCOV2NAA NEGATIVE 01/19/2022   HIV Non Reactive 09/13/2016   PREGTESTUR NEGATIVE 09/25/2020     Cancer No results found for: "CEA", "CA125", "LABCA2"   Allergens No results found for: "ALMOND", "APPLE", "ASPARAGUS", "AVOCADO", "BANANA", "BARLEY", "BASIL", "BAYLEAF", "GREENBEAN", "LIMABEAN", "WHITEBEAN", "BEEFIGE", "REDBEET", "BLUEBERRY", "BROCCOLI", "CABBAGE", "MELON", "CARROT", "CASEIN", "CASHEWNUT", "CAULIFLOWER", "CELERY"     Note: Lab results reviewed.  PFSH  Drug: Ms. Zakowski  reports no history of drug use. Alcohol:  reports current alcohol use. Tobacco:  reports that she quit smoking about 25 years ago. Her smoking use included cigarettes. She has never used smokeless tobacco. Medical:  has a past medical history of Abdominal pain, right upper quadrant (2013), Anxiety, Cancer (HCC), COVID-19 (2021), Endometriosis (01/2013), Gallbladder polyp, Lipidemia (2020), Lymphedema (01/2019), Migraine headache, Ovarian cyst (2014), PONV (postoperative nausea and vomiting), and Vertigo. Family: family history includes Diabetes in her maternal grandmother and mother; Lung cancer in her father; Stroke in her mother.  Past Surgical History:  Procedure Laterality Date   ABLATION  2012   cyst removal   CHOLECYSTECTOMY N/A 10/19/2018   Procedure: LAPAROSCOPIC CHOLECYSTECTOMY;  Surgeon: Leafy Ro, MD;  Location: [facility];  Service: General;  Laterality: N/A;   COLONOSCOPY WITH PROPOFOL N/A 07/21/2020   Procedure: COLONOSCOPY WITH  PROPOFOL;  Surgeon: Midge Minium, MD;  Location: [facility];  Service: Endoscopy;  Laterality: N/A;   CYSTOSCOPY N/A 09/25/2020   Procedure: CYSTOSCOPY;  Surgeon: Vena Austria, MD;  Location: [facility];  Service: Gynecology;  Laterality: N/A;   CYSTOSCOPY W/ URETERAL STENT PLACEMENT Right 09/25/2020   Procedure: CYSTOSCOPY WITH RETROGRADE PYELOGRAM/URETERAL STENT PLACEMENT;  Surgeon: Sondra Come, MD;  Location: [facility];  Service: Urology;  Laterality: Right;   LAPAROSCOPIC OVARIAN CYSTECTOMY Right 10/19/2018   Procedure: LAPAROSCOPIC RIGHT OVARIAN CYSTECTOMY;  Surgeon: Vena Austria, MD;  Location: [facility];  Service: Gynecology;  Laterality: Right;   NECK SURGERY  02/22/2018   Artificial Disc replacement    OVARIAN CYST REMOVAL     POLYPECTOMY N/A 07/21/2020   Procedure: POLYPECTOMY;  Surgeon: Midge Minium, MD;  Location: [facility];  Service: Endoscopy;  Laterality: N/A;   TOTAL LAPAROSCOPIC HYSTERECTOMY WITH SALPINGECTOMY Bilateral 09/25/2020   Procedure: TOTAL LAPAROSCOPIC HYSTERECTOMY WITH SALPINGECTOMY;  Surgeon: Vena Austria, MD;  Location: [facility];  Service: Gynecology;  Laterality: Bilateral;   Active Ambulatory Problems    Diagnosis Date Noted   Gallbladder polyp 09/13/2012   Migraine 08/28/2014   Dermatitis, eczematoid 11/20/2014   External hemorrhoid 11/20/2014   Avitaminosis D 05/19/2009   Endometriosis determined by laparoscopy 07/06/2018   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   Lipedema 01/10/2019   Lymphedema 01/20/2019   Polyp of ascending colon    Pelvic pain    S/P laparoscopic hysterectomy 09/25/2020   Hematuria 03/01/2022   Left lower quadrant abdominal  pain 03/01/2022   Right upper quadrant abdominal pain 03/01/2022   Low libido 05/13/2022   Hot flashes 05/13/2022   Arthralgia of right hip 06/29/2022   Greater trochanteric pain syndrome of right lower extremity 11/20/2014   Multilevel lumbosacral spondylosis with radiculopathy  06/29/2022   Abnormal MRI, Hip (Right) (09/04/2022) 10/24/2022   Abnormal MRI, lumbar spine (09/03/2022) 10/24/2022   Chronic pain syndrome 10/24/2022   Pharmacologic therapy 10/24/2022   Disorder of skeletal system 10/24/2022   Problems influencing health status 10/24/2022   Abnormal MRI, cervical spine (01/18/2018) 10/24/2022   Resolved Ambulatory Problems    Diagnosis Date Noted   Obesity (BMI 30-39.9) 11/20/2014   Bloodgood disease 04/29/2009   High potassium 11/20/2014   Current tobacco use 04/24/2008   Pruritus ani 08/24/2016   History of rectal bleeding 08/24/2016   Chest pain 08/26/2017   Anxiety, generalized 09/19/2017   Leg pain 01/31/2019   Colon cancer screening    Dysmenorrhea 09/25/2020   Endometrioma    Past Medical History:  Diagnosis Date   Abdominal pain, right upper quadrant 2013   Anxiety    Cancer (HCC)    COVID-19 2021   Endometriosis 01/2013   Lipidemia 2020   Migraine headache    Ovarian cyst 2014   PONV (postoperative nausea and vomiting)    Vertigo    Constitutional Exam  General appearance: Well nourished, well developed, and well hydrated. In no apparent acute distress There were no vitals filed for this visit. BMI Assessment: Estimated body mass index is 37.28 kg/m as calculated from the following:   Height as of 09/23/22: 5\' 7"  (1.702 m).   Weight as of 09/23/22: 238 lb (108 kg).  BMI interpretation table: BMI level Category Range association with higher incidence of chronic pain  <18 kg/m2 Underweight   18.5-24.9 kg/m2 Ideal body weight   25-29.9 kg/m2 Overweight Increased incidence by 20%  30-34.9 kg/m2 Obese (Class I) Increased incidence by 68%  35-39.9 kg/m2 Severe obesity (Class II) Increased incidence by 136%  >40 kg/m2 Extreme obesity (Class III) Increased incidence by 254%   Patient's current BMI Ideal Body weight  There is no height or weight on file to calculate BMI. Patient weight not recorded   BMI Readings from Last 4  Encounters:  09/23/22 37.28 kg/m  08/17/22 37.59 kg/m  08/03/22 36.96 kg/m  06/29/22 37.28 kg/m   Wt Readings from Last 4 Encounters:  09/23/22 238 lb (108 kg)  08/17/22 240 lb (108.9 kg)  08/03/22 236 lb (107 kg)  06/29/22 238 lb (108 kg)    Psych/Mental status: Alert, oriented x 3 (person, place, & time)       Eyes: PERLA Respiratory: No evidence of acute respiratory distress  Assessment  Primary Diagnosis & Pertinent Problem List: The primary encounter diagnosis was Chronic pain syndrome. Diagnoses of Pharmacologic therapy, Disorder of skeletal system, and Problems influencing health status were also pertinent to this visit.  Visit Diagnosis (New problems to examiner): 1. Chronic pain syndrome   2. Pharmacologic therapy   3. Disorder of skeletal system   4. Problems influencing health status    Plan of Care (Initial workup plan)  Note: Ms. Johal was reminded that as per protocol, today's visit has been an evaluation only. We have not taken over the patient's controlled substance management.  Problem-specific plan: No problem-specific Assessment & Plan notes found for this encounter.  Lab Orders  No laboratory test(s) ordered today   Imaging Orders  No imaging studies ordered today  Referral Orders  No referral(s) requested today   Procedure Orders    No procedure(s) ordered today   Pharmacotherapy (current): Medications ordered:  No orders of the defined types were placed in this encounter.  Medications administered during this visit: Shaylee C. Gruetzmacher had no medications administered during this visit.   Analgesic Pharmacotherapy:  Opioid Analgesics: For patients currently taking or requesting to take opioid analgesics, in accordance with [facility] Guidelines, we will assess their risks and indications for the use of these substances. After completing our evaluation, we may offer recommendations, but we no longer take patients for  medication management. The prescribing physician will ultimately decide, based on his/her training and level of comfort whether to adopt any of the recommendations, including whether or not to prescribe such medicines.  Membrane stabilizer: To be determined at a later time  Muscle relaxant: To be determined at a later time  NSAID: To be determined at a later time  Other analgesic(s): To be determined at a later time   Interventional management options: Ms. Antonovich was informed that there is no guarantee that she would be a candidate for interventional therapies. The decision will be based on the results of diagnostic studies, as well as Ms. Yearby's risk profile.  Procedure(s) under consideration:  Pending results of ordered studies      Interventional Therapies  Risk Factors  Considerations  Medical Comorbidities:     Planned  Pending:      Under consideration:   Pending   Completed:   None at this time   Therapeutic  Palliative (PRN) options:   None established   Completed by other providers:   None reported       Provider-requested follow-up: No follow-ups on file.  Future Appointments  Date Time Provider Department Center  10/25/2022  1:00 PM [place] Metz, MD [facility] None  10/26/2022  9:45 AM Charlene Brooke, PT [facility] None  11/02/2022  8:15 AM Charlene Brooke, PT [facility] None  11/04/2022  8:40 AM [facility]  11/09/2022 11:15 AM Loralyn Freshwater R, PT [facility] None  11/16/2022 10:30 AM Charlene Brooke, PT [facility] None  11/23/2022 10:30 AM Charlene Brooke, PT [facility] None  11/30/2022 10:30 AM Charlene Brooke, PT [facility] None  03/08/2023 11:00 AM Bacigalupo, Marzella Schlein, MD [facility]    Duration of encounter: *** minutes.  Total time on encounter, as per AMA guidelines included both the face-to-face and non-face-to-face time personally spent by the physician and/or other qualified health care professional(s) on the day of the encounter (includes  time in activities that require the physician or other qualified health care professional and does not include time in activities normally performed by clinical staff). Physician's time may include the following activities when performed: Preparing to see the patient (e.g., pre-charting review of records, searching for previously ordered imaging, lab work, and nerve conduction tests) Review of prior analgesic pharmacotherapies. Reviewing PMP Interpreting ordered tests (e.g., lab work, imaging, nerve conduction tests) Performing post-procedure evaluations, including interpretation of diagnostic procedures Obtaining and/or reviewing separately obtained history Performing a medically appropriate examination and/or evaluation Counseling and educating the patient/family/caregiver Ordering medications, tests, or procedures Referring and communicating with other health care professionals (when not separately reported) Documenting clinical information in the electronic or other health record Independently interpreting results (not separately reported) and communicating results to the patient/ family/caregiver Care coordination (not separately reported)  Note by: Oswaldo Done, MD (TTS technology used. I apologize  for any typographical errors that were not detected and corrected.) Date: 10/25/2022; Time: 1:18 PM

## 2022-10-25 ENCOUNTER — Ambulatory Visit
Admission: RE | Admit: 2022-10-25 | Discharge: 2022-10-25 | Disposition: A | Discharge status: Home / Self Care | Payer: Federal, State, Local not specified - PPO | Source: Ambulatory Visit | Attending: Pain Medicine | Admitting: Pain Medicine

## 2022-10-25 ENCOUNTER — Other Ambulatory Visit
Admission: RE | Admit: 2022-10-25 | Discharge: 2022-10-25 | Disposition: A | Discharge status: Home / Self Care | Payer: Federal, State, Local not specified - PPO | Source: Ambulatory Visit | Attending: Pain Medicine | Admitting: Pain Medicine

## 2022-10-25 ENCOUNTER — Encounter: Payer: Self-pay | Admitting: Pain Medicine

## 2022-10-25 ENCOUNTER — Ambulatory Visit: Payer: Federal, State, Local not specified - PPO | Admitting: Pain Medicine

## 2022-10-25 VITALS — BP 133/83 | HR 71 | Temp 97.7°F | Resp 16 | Ht 67.0 in | Wt 230.0 lb

## 2022-10-25 DIAGNOSIS — G8929 Other chronic pain: Secondary | ICD-10-CM | POA: Diagnosis not present

## 2022-10-25 DIAGNOSIS — Z789 Other specified health status: Secondary | ICD-10-CM | POA: Diagnosis not present

## 2022-10-25 DIAGNOSIS — M899 Disorder of bone, unspecified: Secondary | ICD-10-CM

## 2022-10-25 DIAGNOSIS — R935 Abnormal findings on diagnostic imaging of other abdominal regions, including retroperitoneum: Secondary | ICD-10-CM | POA: Insufficient documentation

## 2022-10-25 DIAGNOSIS — M25551 Pain in right hip: Secondary | ICD-10-CM

## 2022-10-25 DIAGNOSIS — M5136 Other intervertebral disc degeneration, lumbar region: Secondary | ICD-10-CM | POA: Diagnosis not present

## 2022-10-25 DIAGNOSIS — R937 Abnormal findings on diagnostic imaging of other parts of musculoskeletal system: Secondary | ICD-10-CM | POA: Diagnosis not present

## 2022-10-25 DIAGNOSIS — M7071 Other bursitis of hip, right hip: Secondary | ICD-10-CM | POA: Diagnosis not present

## 2022-10-25 DIAGNOSIS — R1031 Right lower quadrant pain: Secondary | ICD-10-CM | POA: Diagnosis not present

## 2022-10-25 DIAGNOSIS — M4310 Spondylolisthesis, site unspecified: Secondary | ICD-10-CM | POA: Insufficient documentation

## 2022-10-25 DIAGNOSIS — M5459 Other low back pain: Secondary | ICD-10-CM | POA: Diagnosis not present

## 2022-10-25 DIAGNOSIS — E785 Hyperlipidemia, unspecified: Secondary | ICD-10-CM | POA: Diagnosis not present

## 2022-10-25 DIAGNOSIS — Z79899 Other long term (current) drug therapy: Secondary | ICD-10-CM | POA: Diagnosis not present

## 2022-10-25 DIAGNOSIS — M545 Low back pain, unspecified: Secondary | ICD-10-CM | POA: Diagnosis not present

## 2022-10-25 DIAGNOSIS — M5126 Other intervertebral disc displacement, lumbar region: Secondary | ICD-10-CM | POA: Diagnosis not present

## 2022-10-25 DIAGNOSIS — G894 Chronic pain syndrome: Secondary | ICD-10-CM | POA: Insufficient documentation

## 2022-10-25 DIAGNOSIS — M48061 Spinal stenosis, lumbar region without neurogenic claudication: Secondary | ICD-10-CM | POA: Diagnosis not present

## 2022-10-25 LAB — COMPREHENSIVE METABOLIC PANEL
ALT: 36 U/L (ref 0–44)
AST: 35 U/L (ref 15–41)
Albumin: 4 g/dL (ref 3.5–5.0)
Alkaline Phosphatase: 41 U/L (ref 38–126)
Anion gap: 8 (ref 5–15)
BUN: 17 mg/dL (ref 6–20)
CO2: 26 mmol/L (ref 22–32)
Calcium: 9.3 mg/dL (ref 8.9–10.3)
Chloride: 104 mmol/L (ref 98–111)
Creatinine, Ser: 0.84 mg/dL (ref 0.44–1.00)
GFR, Estimated: >60 mL/min (ref >60)
Glucose, Bld: 94 mg/dL (ref 70–99)
Potassium: 4.3 mmol/L (ref 3.5–5.1)
Sodium: 138 mmol/L (ref 135–145)
Total Bilirubin: 0.4 mg/dL (ref 0.3–1.2)
Total Protein: 7.3 g/dL (ref 6.5–8.1)

## 2022-10-25 LAB — C-REACTIVE PROTEIN: CRP: 0.9 mg/dL (ref <1.0)

## 2022-10-25 LAB — VITAMIN D 25 HYDROXY (VIT D DEFICIENCY, FRACTURES): Vit D, 25-Hydroxy: 26.42 ng/mL — ABNORMAL LOW (ref 30–100)

## 2022-10-25 LAB — MAGNESIUM: Magnesium: 2.4 mg/dL (ref 1.7–2.4)

## 2022-10-25 LAB — VITAMIN B12: Vitamin B-12: 328 pg/mL (ref 180–914)

## 2022-10-25 LAB — SEDIMENTATION RATE: Sed Rate: 16 mm/hr (ref 0–30)

## 2022-10-25 NOTE — Progress Notes (Unsigned)
Safety precautions to be maintained throughout the outpatient stay will include: orient to surroundings, keep bed in low position, maintain call bell within reach at all times, provide assistance with transfer out of bed and ambulation.  

## 2022-10-26 ENCOUNTER — Telehealth: Payer: Self-pay

## 2022-10-26 ENCOUNTER — Ambulatory Visit: Payer: Federal, State, Local not specified - PPO | Attending: Family Medicine

## 2022-10-26 DIAGNOSIS — Z79899 Other long term (current) drug therapy: Secondary | ICD-10-CM | POA: Diagnosis not present

## 2022-10-26 DIAGNOSIS — G894 Chronic pain syndrome: Secondary | ICD-10-CM | POA: Diagnosis not present

## 2022-10-26 NOTE — Telephone Encounter (Signed)
No show. Called patient and left a message pertaining to appointment and a reminder for the next follow up session. Return phone call requested. Phone number [phone]) provided.

## 2022-10-29 LAB — COMPLIANCE DRUG ANALYSIS, UR

## 2022-11-02 ENCOUNTER — Ambulatory Visit: Payer: Federal, State, Local not specified - PPO

## 2022-11-04 ENCOUNTER — Ambulatory Visit
Admission: RE | Admit: 2022-11-04 | Discharge: 2022-11-04 | Disposition: A | Discharge status: Home / Self Care | Payer: Federal, State, Local not specified - PPO | Source: Ambulatory Visit | Attending: Family Medicine | Admitting: Family Medicine

## 2022-11-04 DIAGNOSIS — Z1231 Encounter for screening mammogram for malignant neoplasm of breast: Secondary | ICD-10-CM | POA: Insufficient documentation

## 2022-11-08 NOTE — Progress Notes (Unsigned)
PROVIDER NOTE: Information contained herein reflects review and annotations entered in association with encounter. Interpretation of such information and data should be left to medically-trained personnel. Information provided to patient can be located elsewhere in the medical record under "Patient Instructions". Document created using STT-dictation technology, any transcriptional errors that may result from process are unintentional.    Patient: Norma Wall  Service Category: E/M  Provider: Oswaldo Done, MD  DOB: 09-Jan-1971  DOS: 11/10/2022  Referring Provider: Erasmo Downer, MD  MRN: [id number]  Specialty: Interventional Pain Management  PCP: Erasmo Downer, MD  Type: Established Patient  Setting: Ambulatory outpatient    Location: Office  Delivery: Face-to-face     Primary Reason(s) for Visit: Encounter for evaluation before starting new chronic pain management plan of care (Level of risk: moderate) CC: No chief complaint on file.  HPI  Norma Wall is a 51 y.o. year old, female patient, who comes today for a follow-up evaluation to review the test results and decide on a treatment plan. She has Gallbladder polyp; Migraine; Dermatitis, eczematoid; External hemorrhoid; Avitaminosis D; Endometriosis determined by laparoscopy; Eagle's syndrome; Lipedema; Lymphedema; Polyp of ascending colon; Pelvic pain; S/P laparoscopic hysterectomy; Hematuria; Left lower quadrant abdominal pain; Right upper quadrant abdominal pain; Low libido; Hot flashes; Arthralgia of right hip; Greater trochanteric pain syndrome of right lower extremity; Multilevel lumbosacral spondylosis with radiculopathy; Abnormal MRI, Hip (Right) (09/04/2022); Abnormal MRI, lumbar spine (09/03/2022); Chronic pain syndrome; Pharmacologic therapy; Disorder of skeletal system; Problems influencing health status; Abnormal MRI, cervical spine (01/18/2018); Chronic hip pain (1ry area of Pain) (Right); Iliopsoas bursitis of hip  (Right); Groin pain (Right); Chronic low back pain (1ry area of Pain) (Bilateral) w/o sciatica; Chronic low back pain (Midline) w/o sciatica; Lumbar facet joint pain; and Retrolisthesis on their problem list. Her primarily concern today is the No chief complaint on file.  Pain Assessment: Location:     Radiating:   Onset:   Duration:   Quality:   Severity:  /10 (subjective, self-reported pain score)  Effect on ADL:   Timing:   Modifying factors:   BP:    HR:    Norma Wall comes in today for a follow-up visit after her initial evaluation on 10/25/2022. Today we went over the results of her tests. These were explained in "Layman's terms". During today's appointment we went over my diagnostic impression, as well as the proposed treatment plan.  Review of initial evaluation (10/25/2022): "According to the patient the primary area of pain is that of the hip (Bilateral) (R>L).  She denies any prior surgeries but does admit to currently undergoing physical therapy for the hip pain and having had a right hip joint injection which did not seem to have helped.  In addition the patient also refers recently having had an MRI of the right hip that upon reviewing it seems to be confusing since it indicates the patient to have a trace "left" iliopsoas bursal fluid.  Since the MRI was of the right hip the patient questions whether or not this was a typographical error.  I have sent an into network message to the reading radiologist to review the images in order to confirm the findings.  In terms of the distribution of this right lower extremity pain, he seems to start in the right hip and run down to the anterolateral aspect of her right shin.  Prior hip injection did not seem to really help that pain as much as some oral steroids that she  took which apparently did completely eliminate the pain for a short period of time until she finished the taper and then the pain started coming back.  She describes the pain to be  worse at night and she also describes being unable to cross her legs secondary to the pain.  If she sits down on a sulfa talking her leg under her body, seems to aggravate the pain.  She also indicates that the pain to be worse when she sleeps on her side.  Interestingly enough, she may experience pain in the right hip when laying down on her left lateral decubitus position.   (08/25/2022) RIGHT HIP MRI IMPRESSION: 1. Mild right hip osteoarthritis. 2. Mild tendinosis of the bilateral hamstring tendon origins. 3. Trace "left" iliopsoas bursal fluid. 4. Colonic diverticulosis.   The patient's secondary area pain is out of the lower back (Midline).  She denies any prior back surgeries or nerve blocks, but does admit to currently undergoing physical therapy for this back pain and hip pain and she also confirms having had an MRI of the lumbar spine.   (08/25/2022) LUMBAR MRI IMPRESSION: Multilevel lumbar disc and facet degeneration resulting in mild-to-moderate lateral recess and neural foraminal stenosis.   Analgesic pharmacotherapy: The patient has been taking gabapentin 300 mg p.o. at bedtime as well as diclofenac.   Physical exam: The patient was able to toe walk and heel walk without any problems.  Lumbar hyperextension did reproduce the midline low back pain.  Lumbar hyperextension and rotation maneuver was also positive bilaterally for ipsilateral lumbar facet pain.  This was concordant with the bilateral Kemp maneuver.  Lumbar flexion was completely within normal limits and normal range of motion with the patient being able to touch the floor.  Patrick maneuver was positive on the right side for right hip joint arthralgia with pain referred to the lateral aspect of the joint as well as the groin area.  Range of motion seems to be adequate for the right hip.  Then maneuver was negative for sacroiliac joint arthralgia or left hip joint pain."  Review of diagnostic tests ordered on 10/25/2022: Lab  work was completely within normal limits.  Unfortunately, the diagnostic x-rays of the lumbar spine with bending views ordered on 10/25/2022 as of today 11/09/2022 do not have an official report.  ***  Patient presented with interventional treatment options. Ms. Mcree was informed that I will not be providing medication management. Pharmacotherapy evaluation including recommendations may be offered, if specifically requested.   Controlled Substance Pharmacotherapy Assessment REMS (Risk Evaluation and Mitigation Strategy)  Opioid Analgesic: None MME/day: 0 mg/day  Pill Count: None expected due to no prior prescriptions written by our practice. No notes on file Pharmacokinetics: Liberation and absorption (onset of action): WNL Distribution (time to peak effect): WNL Metabolism and excretion (duration of action): WNL         Pharmacodynamics: Desired effects: Analgesia: Ms. Jentzsch reports >50% benefit. Functional ability: Patient reports that medication allows her to accomplish basic ADLs Clinically meaningful improvement in function (CMIF): Sustained CMIF goals met Perceived effectiveness: Described as relatively effective, allowing for increase in activities of daily living (ADL) Undesirable effects: Side-effects or Adverse reactions: None reported Monitoring: [place] PMP: PDMP reviewed during this encounter. Online review of the past 37-month period previously conducted. Not applicable at this point since we have not taken over the patient's medication management yet. List of other Serum/Urine Drug Screening Test(s):  No results found for: "AMPHSCRSER", "BARBSCRSER", "BENZOSCRSER", "COCAINSCRSER", "COCAINSCRNUR", "PCPSCRSER", "THCSCRSER", "  THCU", "CANNABQUANT", "OPIATESCRSER", "OXYSCRSER", "PROPOXSCRSER", "ETH", "CBDTHCR", "D8THCCBX", "D9THCCBX" List of all UDS test(s) done:  Lab Results  Component Value Date   SUMMARY Note 10/26/2022   Last UDS on record: Summary  Date Value Ref Range  Status  10/26/2022 Note  Final    Comment:    ==================================================================== Compliance Drug Analysis, Ur ==================================================================== Test                             Result       Flag       Units  Drug Present and Declared for Prescription Verification   Gabapentin                     PRESENT      EXPECTED  Drug Absent but Declared for Prescription Verification   Acetaminophen                  Not Detected UNEXPECTED    Acetaminophen, as indicated in the declared medication list, is not    always detected even when used as directed.    Diclofenac                     Not Detected UNEXPECTED    Diclofenac, as indicated in the declared medication list, is not    always detected even when used as directed.    Ibuprofen                      Not Detected UNEXPECTED    Ibuprofen, as indicated in the declared medication list, is not    always detected even when used as directed.  ==================================================================== Test                      Result    Flag   Units      Ref Range   Creatinine              45               mg/dL      >=47 ==================================================================== Declared Medications:  The flagging and interpretation on this report are based on the  following declared medications.  Unexpected results may arise from  inaccuracies in the declared medications.   **Note: The testing scope of this panel includes these medications:   Gabapentin (Neurontin)   **Note: The testing scope of this panel does not include small to  moderate amounts of these reported medications:   Acetaminophen (Tylenol)  Diclofenac (Voltaren)  Ibuprofen (Advil)   **Note: The testing scope of this panel does not include the  following reported medications:   Calcium  Sumatriptan (Imitrex)  Vitamin  D3 ==================================================================== For clinical consultation, please call [phone]. ====================================================================    UDS interpretation: No unexpected findings.          Medication Assessment Form: Not applicable. No opioids. Treatment compliance: Not applicable Risk Assessment Profile: Aberrant behavior: See initial evaluations. None observed or detected today Comorbid factors increasing risk of overdose: See initial evaluation. No additional risks detected today Opioid risk tool (ORT):     10/25/2022    1:19 PM  Opioid Risk   Alcohol 0  Illegal Drugs 0  Rx Drugs 0  Alcohol 0  Illegal Drugs 0  Rx Drugs 0  Age between 16-45 years  0  Psychological Disease 0  Depression 0  Opioid Risk Tool  Scoring 0  Opioid Risk Interpretation Low Risk    ORT Scoring interpretation table:  Score <3 = Low Risk for SUD  Score between 4-7 = Moderate Risk for SUD  Score >8 = High Risk for Opioid Abuse   Risk of substance use disorder (SUD): Low  Risk Mitigation Strategies:  Patient opioid safety counseling: No controlled substances prescribed. Patient-Prescriber Agreement (PPA): No agreement signed.  Controlled substance notification to other providers: None required. No opioid therapy.  Pharmacologic Plan: Non-opioid analgesic therapy offered. Interventional alternatives discussed.             Laboratory Chemistry Profile   Renal Lab Results  Component Value Date   BUN 17 10/25/2022   CREATININE 0.84 10/25/2022   BCR 13 03/01/2022   GFRAA 89 01/11/2020   GFRNONAA >60 10/25/2022   SPECGRAV 1.010 03/01/2022   PHUR 5.0 03/01/2022   PROTEINUR Positive (A) 03/01/2022     Electrolytes Lab Results  Component Value Date   NA 138 10/25/2022   K 4.3 10/25/2022   CL 104 10/25/2022   CALCIUM 9.3 10/25/2022   MG 2.4 10/25/2022     Hepatic Lab Results  Component Value Date   AST 35 10/25/2022   ALT  36 10/25/2022   ALBUMIN 4.0 10/25/2022   ALKPHOS 41 10/25/2022   AMYLASE 76 03/02/2016   LIPASE 28 03/01/2022     ID Lab Results  Component Value Date   HIV Non Reactive 09/13/2016   SARSCOV2NAA NEGATIVE 01/19/2022   PREGTESTUR NEGATIVE 09/25/2020     Bone Lab Results  Component Value Date   VD25OH 26.42 (L) 10/25/2022     Endocrine Lab Results  Component Value Date   GLUCOSE 94 10/25/2022   GLUCOSEU Negative 10/02/2020   HGBA1C 5.0 02/27/2021   TSH 2.760 01/11/2020     Neuropathy Lab Results  Component Value Date   VITAMINB12 328 10/25/2022   HGBA1C 5.0 02/27/2021   HIV Non Reactive 09/13/2016     CNS No results found for: "COLORCSF", "APPEARCSF", "RBCCOUNTCSF", "WBCCSF", "POLYSCSF", "LYMPHSCSF", "EOSCSF", "PROTEINCSF", "GLUCCSF", "JCVIRUS", "CSFOLI", "IGGCSF", "LABACHR", "ACETBL"   Inflammation (CRP: Acute  ESR: Chronic) Lab Results  Component Value Date   CRP 0.9 10/25/2022   ESRSEDRATE 16 10/25/2022     Rheumatology No results found for: "RF", "ANA", "LABURIC", "URICUR", "LYMEIGGIGMAB", "LYMEABIGMQN", "HLAB27"   Coagulation Lab Results  Component Value Date   PLT 186 03/01/2022     Cardiovascular Lab Results  Component Value Date   TROPONINI <0.03 02/26/2018   HGB 13.6 03/01/2022   HCT 42.6 03/01/2022     Screening Lab Results  Component Value Date   SARSCOV2NAA NEGATIVE 01/19/2022   HIV Non Reactive 09/13/2016   PREGTESTUR NEGATIVE 09/25/2020     Cancer No results found for: "CEA", "CA125", "LABCA2"   Allergens No results found for: "ALMOND", "APPLE", "ASPARAGUS", "AVOCADO", "BANANA", "BARLEY", "BASIL", "BAYLEAF", "GREENBEAN", "LIMABEAN", "WHITEBEAN", "BEEFIGE", "REDBEET", "BLUEBERRY", "BROCCOLI", "CABBAGE", "MELON", "CARROT", "CASEIN", "CASHEWNUT", "CAULIFLOWER", "CELERY"     Note: Lab results reviewed.  Recent Diagnostic Imaging Review  Lumbosacral Imaging: Lumbar MR wo contrast: Results for orders placed during the hospital  encounter of 08/25/22 MR Lumbar Spine Wo Contrast  Narrative CLINICAL DATA:  Lumbar radiculopathy, symptoms persist with > 6 wks treatment. Chronic low back and right lower extremity pain.  EXAM: MRI LUMBAR SPINE WITHOUT CONTRAST  TECHNIQUE: Multiplanar, multisequence MR imaging of the lumbar spine was performed. No intravenous contrast was administered.  COMPARISON:  Lumbar spine radiographs 06/29/2022  FINDINGS: Segmentation:  5 lumbar vertebrae.  Alignment: Minimal retrolisthesis of L2 on L3, L3 on L4, and L4 on L5.  Vertebrae: No fracture, suspicious marrow lesion, or significant marrow edema.  Conus medullaris and cauda equina: Conus extends to the L1 level. Conus and cauda equina appear normal.  Paraspinal and other soft tissues: Unremarkable.  Disc levels:  T12-L1: Negative.  L1-2: Mild disc bulging without stenosis.  L2-3: Disc desiccation and moderate disc space narrowing. Retrolisthesis with bulging uncovered disc and mild facet hypertrophy result in mild-to-moderate bilateral lateral recess stenosis without significant spinal or neural foraminal stenosis.  L3-4: Disc desiccation and mild disc space narrowing. Retrolisthesis with bulging uncovered disc and mild facet hypertrophy result in mild right and mild-to-moderate left lateral recess stenosis and mild bilateral neural foraminal stenosis without spinal stenosis.  L4-5: Disc desiccation and mild disc space narrowing. Retrolisthesis with bulging uncovered disc and moderate facet and ligamentum flavum hypertrophy result in mild-to-moderate bilateral lateral recess stenosis and moderate right and mild left neural foraminal stenosis without spinal stenosis.  L5-S1: Moderate left greater than right facet hypertrophy without disc herniation or stenosis.  IMPRESSION: Multilevel lumbar disc and facet degeneration resulting in mild-to-moderate lateral recess and neural foraminal stenosis  as above.   Electronically Signed By: Sebastian Ache M.D. On: 09/03/2022 16:54  Hip Imaging: Hip-R MR wo contrast: Results for orders placed during the hospital encounter of 08/25/22 MR HIP RIGHT WO CONTRAST  Narrative CLINICAL DATA:  Chronic right hip pain  EXAM: MR OF THE RIGHT HIP WITHOUT CONTRAST  TECHNIQUE: Multiplanar, multisequence MR imaging was performed. No intravenous contrast was administered.  COMPARISON:  X-ray 06/29/2022  FINDINGS: Bones: No acute fracture. No dislocation. No femoral head avascular necrosis. Bony pelvis intact without diastasis. SI joints and pubic symphysis within normal limits. No bone marrow edema. No marrow replacing bone lesion.  Articular cartilage and labrum  Articular cartilage: Mild cartilage thinning with small subchondral cystic changes at the anterosuperior aspect of the right acetabulum.  Labrum: Grossly intact, although evaluation is limited in the absence of intra-articular fluid or contrast. No paralabral cyst.  Joint or bursal effusion  Joint effusion:  None.  Bursae: Trace left iliopsoas bursal fluid. No abnormal right-sided bursal fluid collection.  Muscles and tendons  Muscles and tendons: Mild tendinosis of the bilateral hamstring tendon origins. The gluteal, iliopsoas, rectus femoris, and adductor tendons appear intact without tear or significant tendinosis. Normal muscle bulk and signal intensity without edema, atrophy, or fatty infiltration.  Other findings  Miscellaneous: No soft tissue edema or fluid collection. No inguinal lymphadenopathy. Colonic diverticulosis. Small left adnexal cysts measuring up to 1.8 cm in size. No follow-up imaging recommended. Note: This recommendation does not apply to premenarchal patients and to those with increased risk (genetic, family history, elevated tumor markers or other high-risk factors) of ovarian cancer. Reference: JACR 2020 Feb;  17(2):248-254  IMPRESSION: 1. Mild right hip osteoarthritis. 2. Mild tendinosis of the bilateral hamstring tendon origins. 3. Trace left iliopsoas bursal fluid. 4. Colonic diverticulosis.   Electronically Signed By: Duanne Guess D.O. On: 09/04/2022 14:51  Hip-R DG 2-3 views: Results for orders placed during the hospital encounter of 06/29/22 DG Hip Unilat W OR W/O Pelvis 2-3 Views Right  Narrative CLINICAL DATA:  Pain  EXAM: DG HIP (WITH OR WITHOUT PELVIS) 2-3V RIGHT  COMPARISON:  None Available.  FINDINGS: There is no evidence of hip fracture or dislocation. There is no evidence of arthropathy or other focal bone abnormality.  IMPRESSION: Negative.  Electronically Signed By: Gerome Sam III M.D. On: 06/30/2022 14:09  Complexity Note: Imaging results reviewed.                         Meds   Current Outpatient Medications:    acetaminophen (TYLENOL) 500 MG tablet, Take 1,000 mg by mouth every 6 (six) hours as needed for moderate pain, fever or headache., Disp: , Rfl:    Calcium Carbonate (CALCIUM 500 PO), Take by mouth., Disp: , Rfl:    Cholecalciferol (VITAMIN D3) 50 MCG (2000 UT) CAPS, Take by mouth., Disp: , Rfl:    diclofenac (VOLTAREN) 75 MG EC tablet, Take 1 tablet (75 mg total) by mouth 2 (two) times daily as needed., Disp: 60 tablet, Rfl: 1   gabapentin (NEURONTIN) 300 MG capsule, Take 1 capsule (300 mg total) by mouth at bedtime as needed., Disp: 30 capsule, Rfl: 1   ibuprofen (ADVIL) 200 MG tablet, Take 800 mg by mouth as needed., Disp: , Rfl:    SUMAtriptan (IMITREX) 50 MG tablet, Take 1 tablet (50 mg total) by mouth every 2 (two) hours. May repeat in 2 hours if headache persists or recurs., Disp: 10 tablet, Rfl: 2  ROS  Constitutional: Denies any fever or chills Gastrointestinal: No reported hemesis, hematochezia, vomiting, or acute GI distress Musculoskeletal: Denies any acute onset joint swelling, redness, loss of ROM, or  weakness Neurological: No reported episodes of acute onset apraxia, aphasia, dysarthria, agnosia, amnesia, paralysis, loss of coordination, or loss of consciousness  Allergies  Ms. Hornbacher has No Known Allergies.  PFSH  Drug: Ms. Truong  reports no history of drug use. Alcohol:  reports current alcohol use. Tobacco:  reports that she quit smoking about 25 years ago. Her smoking use included cigarettes. She has never used smokeless tobacco. Medical:  has a past medical history of Abdominal pain, right upper quadrant (2013), Anxiety, Cancer (HCC), COVID-19 (2021), Endometriosis (01/2013), Gallbladder polyp, Lipidemia (2020), Lymphedema (01/2019), Migraine headache, Ovarian cyst (2014), PONV (postoperative nausea and vomiting), and Vertigo. Surgical: Ms. Kalas  has a past surgical history that includes Ablation (2012); Neck surgery (02/22/2018); Ovarian cyst removal; Laparoscopic ovarian cystectomy (Right, 10/19/2018); Cholecystectomy (N/A, 10/19/2018); Colonoscopy with propofol (N/A, 07/21/2020); polypectomy (N/A, 07/21/2020); Total laparoscopic hysterectomy with salpingectomy (Bilateral, 09/25/2020); Cystoscopy (N/A, 09/25/2020); and Cystoscopy w/ ureteral stent placement (Right, 09/25/2020). Family: family history includes Diabetes in her maternal grandmother and mother; Lung cancer in her father; Stroke in her mother.  Constitutional Exam  General appearance: Well nourished, well developed, and well hydrated. In no apparent acute distress There were no vitals filed for this visit. BMI Assessment: Estimated body mass index is 36.02 kg/m as calculated from the following:   Height as of 10/25/22: 5\' 7"  (1.702 m).   Weight as of 10/25/22: 230 lb (104.3 kg).  BMI interpretation table: BMI level Category Range association with higher incidence of chronic pain  <18 kg/m2 Underweight   18.5-24.9 kg/m2 Ideal body weight   25-29.9 kg/m2 Overweight Increased incidence by 20%  30-34.9 kg/m2 Obese (Class I)  Increased incidence by 68%  35-39.9 kg/m2 Severe obesity (Class II) Increased incidence by 136%  >40 kg/m2 Extreme obesity (Class III) Increased incidence by 254%   Patient's current BMI Ideal Body weight  There is no height or weight on file to calculate BMI. Patient weight not recorded   BMI Readings from Last 4 Encounters:  10/25/22 36.02 kg/m  09/23/22 37.28 kg/m  08/17/22 37.59 kg/m  08/03/22 36.96  kg/m   Wt Readings from Last 4 Encounters:  10/25/22 230 lb (104.3 kg)  09/23/22 238 lb (108 kg)  08/17/22 240 lb (108.9 kg)  08/03/22 236 lb (107 kg)    Psych/Mental status: Alert, oriented x 3 (person, place, & time)       Eyes: PERLA Respiratory: No evidence of acute respiratory distress  Assessment & Plan  Primary Diagnosis & Pertinent Problem List: The primary encounter diagnosis was Chronic hip pain (1ry area of Pain) (Right). Diagnoses of Chronic low back pain (1ry area of Pain) (Bilateral) w/o sciatica, Chronic low back pain (Midline) w/o sciatica, Abnormal MRI, lumbar spine (09/03/2022), and Abnormal MRI, Hip (Right) (09/04/2022) were also pertinent to this visit.  Visit Diagnosis: 1. Chronic hip pain (1ry area of Pain) (Right)   2. Chronic low back pain (1ry area of Pain) (Bilateral) w/o sciatica   3. Chronic low back pain (Midline) w/o sciatica   4. Abnormal MRI, lumbar spine (09/03/2022)   5. Abnormal MRI, Hip (Right) (09/04/2022)    Problems updated and reviewed during this visit: No problems updated.  Plan of Care  Pharmacotherapy (Medications Ordered): No orders of the defined types were placed in this encounter.  Procedure Orders    No procedure(s) ordered today   Lab Orders  No laboratory test(s) ordered today   Imaging Orders  No imaging studies ordered today   Referral Orders  No referral(s) requested today    Pharmacological management:  Opioid Analgesics: I will not be prescribing any opioids at this time Membrane stabilizer: I will not  be prescribing any at this time Muscle relaxant: I will not be prescribing any at this time NSAID: I will not be prescribing any at this time Other analgesic(s): I will not be prescribing any at this time      Interventional Therapies  Risk Factors  Considerations  Medical Comorbidities:     Planned  Pending:      Under consideration:   Pending   Completed:   None at this time   Therapeutic  Palliative (PRN) options:   None established   Completed by other providers:   Diagnostic/therapeutic right intra-articular steroid hip joint injection x1         Provider-requested follow-up: No follow-ups on file. Recent Visits Date Type Provider Dept  10/25/22 Office Visit Delano Metz, MD [facility]  Showing recent visits within past 90 days and meeting all other requirements Future Appointments Date Type Provider Dept  11/10/22 Appointment Delano Metz, MD [facility]  Showing future appointments within next 90 days and meeting all other requirements   Primary Care Physician: Erasmo Downer, MD  Duration of encounter: *** minutes.  Total time on encounter, as per AMA guidelines included both the face-to-face and non-face-to-face time personally spent by the physician and/or other qualified health care professional(s) on the day of the encounter (includes time in activities that require the physician or other qualified health care professional and does not include time in activities normally performed by clinical staff). Physician's time may include the following activities when performed: Preparing to see the patient (e.g., pre-charting review of records, searching for previously ordered imaging, lab work, and nerve conduction tests) Review of prior analgesic pharmacotherapies. Reviewing PMP Interpreting ordered tests (e.g., lab work, imaging, nerve conduction tests) Performing post-procedure evaluations, including interpretation of  diagnostic procedures Obtaining and/or reviewing separately obtained history Performing a medically appropriate examination and/or evaluation Counseling and educating the patient/family/caregiver Ordering medications, tests, or procedures Referring  and communicating with other health care professionals (when not separately reported) Documenting clinical information in the electronic or other health record Independently interpreting results (not separately reported) and communicating results to the patient/ family/caregiver Care coordination (not separately reported)  Note by: Oswaldo Done, MD (TTS technology used. I apologize for any typographical errors that were not detected and corrected.) Date: 11/10/2022; Time: 5:24 PM

## 2022-11-10 ENCOUNTER — Ambulatory Visit: Payer: Federal, State, Local not specified - PPO | Attending: Pain Medicine | Admitting: Pain Medicine

## 2022-11-10 ENCOUNTER — Encounter: Payer: Self-pay | Admitting: Pain Medicine

## 2022-11-10 VITALS — BP 111/80 | HR 77 | Temp 97.3°F | Resp 16 | Ht 67.0 in | Wt 240.0 lb

## 2022-11-10 DIAGNOSIS — M4802 Spinal stenosis, cervical region: Secondary | ICD-10-CM | POA: Diagnosis present

## 2022-11-10 DIAGNOSIS — R937 Abnormal findings on diagnostic imaging of other parts of musculoskeletal system: Secondary | ICD-10-CM | POA: Insufficient documentation

## 2022-11-10 DIAGNOSIS — M1611 Unilateral primary osteoarthritis, right hip: Secondary | ICD-10-CM | POA: Insufficient documentation

## 2022-11-10 DIAGNOSIS — M5020 Other cervical disc displacement, unspecified cervical region: Secondary | ICD-10-CM | POA: Diagnosis present

## 2022-11-10 DIAGNOSIS — M76891 Other specified enthesopathies of right lower limb, excluding foot: Secondary | ICD-10-CM | POA: Insufficient documentation

## 2022-11-10 DIAGNOSIS — M47816 Spondylosis without myelopathy or radiculopathy, lumbar region: Secondary | ICD-10-CM | POA: Diagnosis not present

## 2022-11-10 DIAGNOSIS — M4807 Spinal stenosis, lumbosacral region: Secondary | ICD-10-CM | POA: Diagnosis not present

## 2022-11-10 DIAGNOSIS — R1031 Right lower quadrant pain: Secondary | ICD-10-CM | POA: Diagnosis not present

## 2022-11-10 DIAGNOSIS — M47817 Spondylosis without myelopathy or radiculopathy, lumbosacral region: Secondary | ICD-10-CM | POA: Insufficient documentation

## 2022-11-10 DIAGNOSIS — M7072 Other bursitis of hip, left hip: Secondary | ICD-10-CM | POA: Diagnosis not present

## 2022-11-10 DIAGNOSIS — M25551 Pain in right hip: Secondary | ICD-10-CM | POA: Insufficient documentation

## 2022-11-10 DIAGNOSIS — M51369 Other intervertebral disc degeneration, lumbar region without mention of lumbar back pain or lower extremity pain: Secondary | ICD-10-CM | POA: Insufficient documentation

## 2022-11-10 DIAGNOSIS — M51362 Other intervertebral disc degeneration, lumbar region with discogenic back pain and lower extremity pain: Secondary | ICD-10-CM | POA: Insufficient documentation

## 2022-11-10 DIAGNOSIS — G542 Cervical root disorders, not elsewhere classified: Secondary | ICD-10-CM | POA: Insufficient documentation

## 2022-11-10 DIAGNOSIS — M5030 Other cervical disc degeneration, unspecified cervical region: Secondary | ICD-10-CM | POA: Insufficient documentation

## 2022-11-10 DIAGNOSIS — M48061 Spinal stenosis, lumbar region without neurogenic claudication: Secondary | ICD-10-CM | POA: Insufficient documentation

## 2022-11-10 DIAGNOSIS — M50223 Other cervical disc displacement at C6-C7 level: Secondary | ICD-10-CM | POA: Insufficient documentation

## 2022-11-10 DIAGNOSIS — M545 Low back pain, unspecified: Secondary | ICD-10-CM | POA: Diagnosis not present

## 2022-11-10 DIAGNOSIS — M2428 Disorder of ligament, vertebrae: Secondary | ICD-10-CM | POA: Insufficient documentation

## 2022-11-10 DIAGNOSIS — R935 Abnormal findings on diagnostic imaging of other abdominal regions, including retroperitoneum: Secondary | ICD-10-CM | POA: Diagnosis not present

## 2022-11-10 DIAGNOSIS — G8929 Other chronic pain: Secondary | ICD-10-CM | POA: Diagnosis not present

## 2022-11-10 NOTE — Progress Notes (Signed)
Safety precautions to be maintained throughout the outpatient stay will include: orient to surroundings, keep bed in low position, maintain call bell within reach at all times, provide assistance with transfer out of bed and ambulation.  

## 2022-11-10 NOTE — Patient Instructions (Addendum)
______________________________________________________________________    Pain Prevention Technique  Definition:   A technique used to minimize the effects of an activity known to cause inflammation or swelling, which in turn leads to an increase in pain.  Purpose: To prevent swelling from occurring. It is based on the fact that it is easier to prevent swelling from happening than it is to get rid of it, once it occurs.  Contraindications: Anyone with allergy or hypersensitivity to the recommended medications. Anyone taking anticoagulants (Blood Thinners) (e.g., Coumadin, Warfarin, Plavix, etc.). Patients in Renal Failure or having chronic kidney disease.  Technique: Before you undertake an activity known to cause pain, or a flare-up of your chronic pain, and before you experience any pain, do the following:  On a full stomach, take 4 (four) over the counter Ibuprofens 200mg  tablets (Motrin), for a total of 800 mg. In addition, take over the counter Magnesium 400 to 500 mg, before doing the activity.  Six (6) hours later, again on a full stomach, repeat the Ibuprofen. That night, take a warm shower and stretch under the running warm water.  This technique may be sufficient to abort the pain and discomfort before it happens. Keep in mind that it takes a lot less medication to prevent swelling than it takes to eliminate it once it occurs.   Last Update: 08/12/2022  ______________________________________________________________________     ______________________________________________________________________    Procedure instructions  Stop blood-thinners  Do not eat or drink fluids (other than water) for 6 hours before your procedure  No water for 2 hours before your procedure  Take your blood pressure medicine with a sip of water  Arrive 30 minutes before your appointment  If sedation is planned, bring suitable driver. Pennie Banter, Benedetto Goad, & public transportation are NOT  APPROVED)  Carefully read the "Preparing for your procedure" detailed instructions  If you have questions call us at [phone]  ______________________________________________________________________      ______________________________________________________________________    Preparing for your procedure  Appointments: If you think you may not be able to keep your appointment, call 24-48 hours in advance to cancel. We need time to make it available to others.  During your procedure appointment there will be: No Prescription Refills. No disability issues to discussed. No medication changes or discussions.  Instructions: Food intake: Avoid eating anything solid for at least 8 hours prior to your procedure. Clear liquid intake: You may take clear liquids such as water up to 2 hours prior to your procedure. (No carbonated drinks. No soda.) Transportation: Unless otherwise stated by your physician, bring a driver. (Driver cannot be a Market researcher, Pharmacist, community, or any other form of public transportation.) Morning Medicines: Except for blood thinners, take all of your other morning medications with a sip of water. Make sure to take your heart and blood pressure medicines. If your blood pressure's lower number is above 100, the case will be rescheduled. Blood thinners: Make sure to stop your blood thinners as instructed.  If you take a blood thinner, but were not instructed to stop it, call our office [phone] and ask to talk to a nurse. Not stopping a blood thinner prior to certain procedures could lead to serious complications. Diabetics on insulin: Notify the staff so that you can be scheduled 1st case in the morning. If your diabetes requires high dose insulin, take only  of your normal insulin dose the morning of the procedure and notify the staff that you have done so. Preventing infections: Shower with an antibacterial  soap the morning of your procedure.  Build-up your immune system: Take  1000 mg of Vitamin C with every meal (3 times a day) the day prior to your procedure. Antibiotics: Inform the nursing staff if you are taking any antibiotics or if you have any conditions that may require antibiotics prior to procedures. (Example: recent joint implants)   Pregnancy: If you are pregnant make sure to notify the nursing staff. Not doing so may result in injury to the fetus, including death.  Sickness: If you have a cold, fever, or any active infections, call and cancel or reschedule your procedure. Receiving steroids while having an infection may result in complications. Arrival: You must be in the facility at least 30 minutes prior to your scheduled procedure. Tardiness: Your scheduled time is also the cutoff time. If you do not arrive at least 15 minutes prior to your procedure, you will be rescheduled.  Children: Do not bring any children with you. Make arrangements to keep them home. Dress appropriately: There is always a possibility that your clothing may get soiled. Avoid long dresses. Valuables: Do not bring any jewelry or valuables.  Reasons to call and reschedule or cancel your procedure: (Following these recommendations will minimize the risk of a serious complication.) Surgeries: Avoid having procedures within 2 weeks of any surgery. (Avoid for 2 weeks before or after any surgery). Flu Shots: Avoid having procedures within 2 weeks of a flu shots or . (Avoid for 2 weeks before or after immunizations). Barium: Avoid having a procedure within 7-10 days after having had a radiological study involving the use of radiological contrast. (Myelograms, Barium swallow or enema study). Heart attacks: Avoid any elective procedures or surgeries for the initial 6 months after a "Myocardial Infarction" (Heart Attack). Blood thinners: It is imperative that you stop these medications before procedures. Let us know if you if you take any blood thinner.  Infection: Avoid procedures during or  within two weeks of an infection (including chest colds or gastrointestinal problems). Symptoms associated with infections include: Localized redness, fever, chills, night sweats or profuse sweating, burning sensation when voiding, cough, congestion, stuffiness, runny nose, sore throat, diarrhea, nausea, vomiting, cold or Flu symptoms, recent or current infections. It is specially important if the infection is over the area that we intend to treat. Heart and lung problems: Symptoms that may suggest an active cardiopulmonary problem include: cough, chest pain, breathing difficulties or shortness of breath, dizziness, ankle swelling, uncontrolled high or unusually low blood pressure, and/or palpitations. If you are experiencing any of these symptoms, cancel your procedure and contact your primary care physician for an evaluation.  Remember:  Regular Business hours are:  Monday to Thursday 8:00 AM to 4:00 PM  Provider's Schedule: Delano Metz, MD:  Procedure days: Tuesday and Thursday 7:30 AM to 4:00 PM  Edward Jolly, MD:  Procedure days: Monday and Wednesday 7:30 AM to 4:00 PM Last  Updated: 09/21/2022 ______________________________________________________________________      ______________________________________________________________________    General Risks and Possible Complications  Patient Responsibilities: It is important that you read this as it is part of your informed consent. It is our duty to inform you of the risks and possible complications associated with treatments offered to you. It is your responsibility as a patient to read this and to ask questions about anything that is not clear or that you believe was not covered in this document.  Patient's Rights: You have the right to refuse treatment. You also have the right to  change your mind, even after initially having agreed to have the treatment done. However, under this last option, if you wait until the last second to  change your mind, you may be charged for the materials used up to that point.  Introduction: Medicine is not an Visual merchandiser. Everything in Medicine, including the lack of treatment(s), carries the potential for danger, harm, or loss (which is by definition: Risk). In Medicine, a complication is a secondary problem, condition, or disease that can aggravate an already existing one. All treatments carry the risk of possible complications. The fact that a side effects or complications occurs, does not imply that the treatment was conducted incorrectly. It must be clearly understood that these can happen even when everything is done following the highest safety standards.  No treatment: You can choose not to proceed with the proposed treatment alternative. The "PRO(s)" would include: avoiding the risk of complications associated with the therapy. The "CON(s)" would include: not getting any of the treatment benefits. These benefits fall under one of three categories: diagnostic; therapeutic; and/or palliative. Diagnostic benefits include: getting information which can ultimately lead to improvement of the disease or symptom(s). Therapeutic benefits are those associated with the successful treatment of the disease. Finally, palliative benefits are those related to the decrease of the primary symptoms, without necessarily curing the condition (example: decreasing the pain from a flare-up of a chronic condition, such as incurable terminal cancer).  General Risks and Complications: These are associated to most interventional treatments. They can occur alone, or in combination. They fall under one of the following six (6) categories: no benefit or worsening of symptoms; bleeding; infection; nerve damage; allergic reactions; and/or death. No benefits or worsening of symptoms: In Medicine there are no guarantees, only probabilities. No healthcare provider can ever guarantee that a medical treatment will work, they can  only state the probability that it may. Furthermore, there is always the possibility that the condition may worsen, either directly, or indirectly, as a consequence of the treatment. Bleeding: This is more common if the patient is taking a blood thinner, either prescription or over the counter (example: Goody Powders, Fish oil, Aspirin, Garlic, etc.), or if suffering a condition associated with impaired coagulation (example: Hemophilia, cirrhosis of the liver, low platelet counts, etc.). However, even if you do not have one on these, it can still happen. If you have any of these conditions, or take one of these drugs, make sure to notify your treating physician. Infection: This is more common in patients with a compromised immune system, either due to disease (example: diabetes, cancer, human immunodeficiency virus [HIV], etc.), or due to medications or treatments (example: therapies used to treat cancer and rheumatological diseases). However, even if you do not have one on these, it can still happen. If you have any of these conditions, or take one of these drugs, make sure to notify your treating physician. Nerve Damage: This is more common when the treatment is an invasive one, but it can also happen with the use of medications, such as those used in the treatment of cancer. The damage can occur to small secondary nerves, or to large primary ones, such as those in the spinal cord and brain. This damage may be temporary or permanent and it may lead to impairments that can range from temporary numbness to permanent paralysis and/or brain death. Allergic Reactions: Any time a substance or material comes in contact with our body, there is the possibility of an allergic reaction.  These can range from a mild skin rash (contact dermatitis) to a severe systemic reaction (anaphylactic reaction), which can result in death. Death: In general, any medical intervention can result in death, most of the time due to an  unforeseen complication. ______________________________________________________________________

## 2022-11-23 ENCOUNTER — Ambulatory Visit: Payer: Federal, State, Local not specified - PPO | Admitting: Pain Medicine

## 2022-11-25 ENCOUNTER — Ambulatory Visit: Payer: Federal, State, Local not specified - PPO | Attending: Pain Medicine | Admitting: Pain Medicine

## 2022-11-25 ENCOUNTER — Ambulatory Visit
Admission: RE | Admit: 2022-11-25 | Discharge: 2022-11-25 | Disposition: A | Discharge status: Home / Self Care | Payer: Federal, State, Local not specified - PPO | Source: Ambulatory Visit | Attending: Pain Medicine | Admitting: Pain Medicine

## 2022-11-25 ENCOUNTER — Encounter: Payer: Self-pay | Admitting: Pain Medicine

## 2022-11-25 VITALS — BP 121/73 | HR 67 | Temp 97.3°F | Resp 16 | Ht 67.0 in | Wt 238.0 lb

## 2022-11-25 DIAGNOSIS — Z87898 Personal history of other specified conditions: Secondary | ICD-10-CM | POA: Diagnosis not present

## 2022-11-25 DIAGNOSIS — M25551 Pain in right hip: Secondary | ICD-10-CM | POA: Insufficient documentation

## 2022-11-25 DIAGNOSIS — M1611 Unilateral primary osteoarthritis, right hip: Secondary | ICD-10-CM | POA: Insufficient documentation

## 2022-11-25 DIAGNOSIS — M7071 Other bursitis of hip, right hip: Secondary | ICD-10-CM | POA: Insufficient documentation

## 2022-11-25 DIAGNOSIS — G8929 Other chronic pain: Secondary | ICD-10-CM | POA: Insufficient documentation

## 2022-11-25 DIAGNOSIS — M76891 Other specified enthesopathies of right lower limb, excluding foot: Secondary | ICD-10-CM | POA: Insufficient documentation

## 2022-11-25 DIAGNOSIS — R1031 Right lower quadrant pain: Secondary | ICD-10-CM | POA: Diagnosis not present

## 2022-11-25 MED ORDER — MIDAZOLAM HCL 5 MG/5ML IJ SOLN
0.5000 mg | Freq: Once | INTRAMUSCULAR | Status: AC
  Administered 2022-11-25: 2 mg via INTRAVENOUS

## 2022-11-25 MED ORDER — LIDOCAINE HCL 2 % IJ SOLN
INTRAMUSCULAR | Status: AC
  Filled 2022-11-25: qty 20

## 2022-11-25 MED ORDER — MIDAZOLAM HCL 5 MG/5ML IJ SOLN
INTRAMUSCULAR | Status: AC
  Filled 2022-11-25: qty 5

## 2022-11-25 MED ORDER — FENTANYL CITRATE (PF) 100 MCG/2ML IJ SOLN: 25.0000 ug | INTRAMUSCULAR | Status: DC | PRN

## 2022-11-25 MED ORDER — ONDANSETRON HCL 4 MG/2ML IJ SOLN
4.0000 mg | Freq: Once | INTRAMUSCULAR | Status: AC
  Administered 2022-11-25: 4 mg via INTRAVENOUS

## 2022-11-25 MED ORDER — IOHEXOL 180 MG/ML  SOLN
INTRAMUSCULAR | Status: AC
  Filled 2022-11-25: qty 10

## 2022-11-25 MED ORDER — LACTATED RINGERS IV SOLN: Freq: Once | INTRAVENOUS | Status: AC

## 2022-11-25 MED ORDER — ROPIVACAINE HCL 2 MG/ML IJ SOLN
9.0000 mL | Freq: Once | INTRAMUSCULAR | Status: AC
  Administered 2022-11-25: 9 mL via INTRA_ARTICULAR

## 2022-11-25 MED ORDER — PENTAFLUOROPROP-TETRAFLUOROETH EX AERO
INHALATION_SPRAY | Freq: Once | CUTANEOUS | Status: AC
  Administered 2022-11-25: 30 via TOPICAL
  Filled 2022-11-25: qty 30

## 2022-11-25 MED ORDER — ONDANSETRON HCL 4 MG/2ML IJ SOLN
INTRAMUSCULAR | Status: AC
  Filled 2022-11-25: qty 2

## 2022-11-25 MED ORDER — METHYLPREDNISOLONE ACETATE 80 MG/ML IJ SUSP
80.0000 mg | Freq: Once | INTRAMUSCULAR | Status: AC
  Administered 2022-11-25: 80 mg via INTRA_ARTICULAR

## 2022-11-25 MED ORDER — LIDOCAINE HCL 2 % IJ SOLN
20.0000 mL | Freq: Once | INTRAMUSCULAR | Status: AC
  Administered 2022-11-25: 400 mg

## 2022-11-25 MED ORDER — FENTANYL CITRATE (PF) 100 MCG/2ML IJ SOLN
INTRAMUSCULAR | Status: AC
  Filled 2022-11-25: qty 2

## 2022-11-25 MED ORDER — METHYLPREDNISOLONE ACETATE 80 MG/ML IJ SUSP
INTRAMUSCULAR | Status: AC
  Filled 2022-11-25: qty 1

## 2022-11-25 MED ORDER — ROPIVACAINE HCL 2 MG/ML IJ SOLN
INTRAMUSCULAR | Status: AC
  Filled 2022-11-25: qty 20

## 2022-11-25 MED ORDER — IOHEXOL 180 MG/ML  SOLN
10.0000 mL | Freq: Once | INTRAMUSCULAR | Status: AC
  Administered 2022-11-25: 10 mL via INTRA_ARTICULAR

## 2022-11-25 NOTE — Patient Instructions (Signed)

## 2022-11-25 NOTE — Progress Notes (Signed)
PROVIDER NOTE: Interpretation of information contained herein should be left to medically-trained personnel. Specific patient instructions are provided elsewhere under "Patient Instructions" section of medical record. This document was created in part using STT-dictation technology, any transcriptional errors that may result from this process are unintentional.  Patient: Norma Wall Type: Established DOB: Mar 08, 1970 MRN: [id number] PCP: Erasmo Downer, MD  Service: Procedure DOS: 11/25/2022 Setting: Ambulatory Location: Ambulatory outpatient facility Delivery: Face-to-face Provider: Oswaldo Done, MD Specialty: Interventional Pain Management Specialty designation: 09 Location: Outpatient facility Ref. Prov.: Bacigalupo, Marzella Schlein, MD       Interventional Therapy   Type: Hip & bursae injection #1  Laterality: Right (-RT)  Bursae: Trochanteric  Laterality: Right (-RT)  Approach: Percutaneous  anterolateral  approach. Level: Lower pelvic and hip joint level.  Imaging: Fluoroscopy-guided         Anesthesia: Local anesthesia (1-2% Lidocaine) Anxiolysis: IV Versed 2.0 mg Sedation: Moderate Sedation None required. No Fentanyl administered.         DOS: 11/25/2022  Performed by: Oswaldo Done, MD  Purpose: Diagnostic/Therapeutic Indications: Hip pain severe enough to impact quality of life or function. Rationale (medical necessity): procedure needed and proper for the diagnosis and/or treatment of Norma Wall's medical symptoms and needs. 1. Chronic hip pain (1ry area of Pain) (Right)   2. Osteoarthritis of hip (Right)   3. Iliopsoas bursitis of hip (Right)   4. Groin pain (Right)   5. Hamstring tendinitis of thigh (Right)   6. Greater trochanteric pain syndrome of lower extremity (Right)   7. History of postoperative nausea and vomiting   8. Chronic hip pain, right   9. Primary osteoarthritis of right hip   10. Iliopsoas bursitis of right hip   11. Groin  pain, right   12. Hamstring tendinitis of right thigh   13. Greater trochanteric pain syndrome of right lower extremity    NAS-11 Pain score:   Pre-procedure: 2 /10   Post-procedure: 0-No pain/10      Target: Intra-articular aspect of the hip joint & peri-articular bursae Region: Hip joint proper. Femoral region Procedure Type: Percutaneous injection   Position / Prep / Materials:  Position: Lateral Decubitus with affected side up  Prep solution: ChloraPrep (2% chlorhexidine gluconate and 70% isopropyl alcohol) Prep Area:  Entire Posterolateral hip area. Materials:  Tray: Block tray Needle(s):  Type: Spinal  Gauge (G): 22  Length: 7-in  Qty: 1  H&P (Pre-op Assessment):  Norma Wall is a 52 y.o. (year old), female patient, seen today for interventional treatment. She  has a past surgical history that includes Ablation (2012); Neck surgery (02/22/2018); Ovarian cyst removal; Laparoscopic ovarian cystectomy (Right, 10/19/2018); Cholecystectomy (N/A, 10/19/2018); Colonoscopy with propofol (N/A, 07/21/2020); polypectomy (N/A, 07/21/2020); Total laparoscopic hysterectomy with salpingectomy (Bilateral, 09/25/2020); Cystoscopy (N/A, 09/25/2020); and Cystoscopy w/ ureteral stent placement (Right, 09/25/2020). Norma Wall has a current medication list which includes the following prescription(s): acetaminophen, calcium carbonate, vitamin d3, diclofenac, gabapentin, ibuprofen, and sumatriptan, and the following Facility-Administered Medications: fentanyl and lactated ringers. Her primarily concern today is the Hip Pain (Radiates down right hip to knee)  Initial Vital Signs:  Pulse/HCG Rate: 75ECG Heart Rate: 63 (NSR) Temp: (!) 97 F (36.1 C) Resp: 18 BP: 127/85 SpO2: 98 %  BMI: Estimated body mass index is 37.28 kg/m as calculated from the following:   Height as of this encounter: 5\' 7"  (1.702 m).   Weight as of this encounter: 238 lb (108 kg).  Risk Assessment: Allergies: Reviewed. She  has No  Known Allergies.  Allergy Precautions: None required Coagulopathies: Reviewed. None identified.  Blood-thinner therapy: None at this time Active Infection(s): Reviewed. None identified. Norma Wall is afebrile  Site Confirmation: Norma Wall was asked to confirm the procedure and laterality before marking the site Procedure checklist: Completed Consent: Before the procedure and under the influence of no sedative(s), amnesic(s), or anxiolytics, the patient was informed of the treatment options, risks and possible complications. To fulfill our ethical and legal obligations, as recommended by the American Medical Association's Code of Ethics, I have informed the patient of my clinical impression; the nature and purpose of the treatment or procedure; the risks, benefits, and possible complications of the intervention; the alternatives, including doing nothing; the risk(s) and benefit(s) of the alternative treatment(s) or procedure(s); and the risk(s) and benefit(s) of doing nothing. The patient was provided information about the general risks and possible complications associated with the procedure. These may include, but are not limited to: failure to achieve desired goals, infection, bleeding, organ or nerve damage, allergic reactions, paralysis, and death. In addition, the patient was informed of those risks and complications associated to the procedure, such as failure to decrease pain; infection; bleeding; organ or nerve damage with subsequent damage to sensory, motor, and/or autonomic systems, resulting in permanent pain, numbness, and/or weakness of one or several areas of the body; allergic reactions; (i.e.: anaphylactic reaction); and/or death. Furthermore, the patient was informed of those risks and complications associated with the medications. These include, but are not limited to: allergic reactions (i.e.: anaphylactic or anaphylactoid reaction(s)); adrenal axis suppression; blood sugar elevation that  in diabetics may result in ketoacidosis or comma; water retention that in patients with history of congestive heart failure may result in shortness of breath, pulmonary edema, and decompensation with resultant heart failure; weight gain; swelling or edema; medication-induced neural toxicity; particulate matter embolism and blood vessel occlusion with resultant organ, and/or nervous system infarction; and/or aseptic necrosis of one or more joints. Finally, the patient was informed that Medicine is not an exact science; therefore, there is also the possibility of unforeseen or unpredictable risks and/or possible complications that may result in a catastrophic outcome. The patient indicated having understood very clearly. We have given the patient no guarantees and we have made no promises. Enough time was given to the patient to ask questions, all of which were answered to the patient's satisfaction. Ms. Ruesch has indicated that she wanted to continue with the procedure. Attestation: I, the ordering provider, attest that I have discussed with the patient the benefits, risks, side-effects, alternatives, likelihood of achieving goals, and potential problems during recovery for the procedure that I have provided informed consent. Date  Time: 11/25/2022  8:11 AM  Pre-Procedure Preparation:  Monitoring: As per clinic protocol. Respiration, ETCO2, SpO2, BP, heart rate and rhythm monitor placed and checked for adequate function Safety Precautions: Patient was assessed for positional comfort and pressure points before starting the procedure. Time-out: I initiated and conducted the "Time-out" before starting the procedure, as per protocol. The patient was asked to participate by confirming the accuracy of the "Time Out" information. Verification of the correct person, site, and procedure were performed and confirmed by me, the nursing staff, and the patient. "Time-out" conducted as per Joint Commission's Universal  Protocol (UP.01.01.01). Time: 1000 Start Time: 1000 hrs.  Narrative                Rationale (medical necessity): procedure needed and proper for the diagnosis and/or treatment  of the patient's medical symptoms and needs. Procedural Technique Safety Precautions: Aspiration looking for blood return was conducted prior to all injections. At no point did we inject any substances, as a needle was being advanced. No attempts were made at seeking any paresthesias. Safe injection practices and needle disposal techniques used. Medications properly checked for expiration dates. SDV (single dose vial) medications used. Description of the Procedure: Protocol guidelines were followed. The patient was assisted into a comfortable position. The target area was identified and the area prepped in the usual manner. Skin & deeper tissues infiltrated with local anesthetic. Appropriate amount of time allowed to pass for local anesthetics to take effect. The procedure needles were then advanced to the target area. Proper needle placement secured. Negative aspiration confirmed. Solution injected in intermittent fashion, asking for systemic symptoms every 0.5cc of injectate. The needles were then removed and the area cleansed, making sure to leave some of the prepping solution back to take advantage of its long term bactericidal properties.  Technical description of procedure:  Skin & deeper tissues infiltrated with local anesthetic. Appropriate amount of time allowed to pass for local anesthetics to take effect. The procedure needles were then advanced to the target area. Proper needle placement secured. Negative aspiration confirmed. Solution injected in intermittent fashion, asking for systemic symptoms every 0.5cc of injectate. The needles were then removed and the area cleansed, making sure to leave some of the prepping solution back to take advantage of its long term bactericidal properties.             Vitals:    11/25/22 0955 11/25/22 1000 11/25/22 1008 11/25/22 1021  BP: (!) 140/83 133/73 138/77 121/73  Pulse: 66   67  Resp: 14 15 16 16   Temp:    (!) 97.3 F (36.3 C)  TempSrc:    Temporal  SpO2: 99% 98% 100% 100%  Weight:      Height:         Start Time: 1000 hrs. End Time: 1008 hrs.  Imaging Guidance (Non-Spinal):          Type of Imaging Technique: Fluoroscopy Guidance (Non-Spinal) Indication(s): Assistance in needle guidance and placement for procedures requiring needle placement in or near specific anatomical locations not easily accessible without such assistance. Exposure Time: Please see nurses notes. Contrast: Before injecting any contrast, we confirmed that the patient did not have an allergy to iodine, shellfish, or radiological contrast. Once satisfactory needle placement was completed at the desired level, radiological contrast was injected. Contrast injected under live fluoroscopy. No contrast complications. See chart for type and volume of contrast used. Fluoroscopic Guidance: I was personally present during the use of fluoroscopy. "Tunnel Vision Technique" used to obtain the best possible view of the target area. Parallax error corrected before commencing the procedure. "Direction-depth-direction" technique used to introduce the needle under continuous pulsed fluoroscopy. Once target was reached, antero-posterior, oblique, and lateral fluoroscopic projection used confirm needle placement in all planes. Images permanently stored in EMR. Interpretation: I personally interpreted the imaging intraoperatively. Adequate needle placement confirmed in multiple planes. Appropriate spread of contrast into desired area was observed. No evidence of afferent or efferent intravascular uptake. Permanent images saved into the patient's record.  Post-operative Assessment:  Post-procedure Vital Signs:  Pulse/HCG Rate: 6763 (NSR) Temp: (!) 97.3 F (36.3 C) Resp: 16 BP: 121/73 SpO2: 100 %  EBL:  None  Complications: No immediate post-treatment complications observed by team, or reported by patient.  Note: The patient tolerated the entire  procedure well. A repeat set of vitals were taken after the procedure and the patient was kept under observation following institutional policy, for this type of procedure. Post-procedural neurological assessment was performed, showing return to baseline, prior to discharge. The patient was provided with post-procedure discharge instructions, including a section on how to identify potential problems. Should any problems arise concerning this procedure, the patient was given instructions to immediately contact us, at any time, without hesitation. In any case, we plan to contact the patient by telephone for a follow-up status report regarding this interventional procedure.  Comments:  No additional relevant information.  Plan of Care (POC)  Orders:  Orders Placed This Encounter  Procedures   HIP INJECTION    Scheduling Instructions:     Side: Right-sided     Sedation: Patient's choice.     Timeframe: Today   DG PA[facility] C-ARM 1-60 MIN NO REPORT    Intraoperative interpretation by procedural physician at [facility].    Standing Status:   Standing    Number of Occurrences:   1    Order Specific Question:   Reason for exam:    Answer:   Assistance in needle guidance and placement for procedures requiring needle placement in or near specific anatomical locations not easily accessible without such assistance.   Informed Consent Details: Physician/Practitioner Attestation; Transcribe to consent form and obtain patient signature    Nursing Order: Transcribe to consent form and obtain patient signature. Note: Always confirm laterality of pain with Ms. Lazarz, before procedure.    Order Specific Question:   Physician/Practitioner attestation of informed consent for procedure/surgical case    Answer:   I, the physician/practitioner, attest that I  have discussed with the patient the benefits, risks, side effects, alternatives, likelihood of achieving goals and potential problems during recovery for the procedure that I have provided informed consent.    Order Specific Question:   Procedure    Answer:   Hip injection    Order Specific Question:   Physician/Practitioner performing the procedure    Answer:   Taitum Menton A. Laban Emperor, MD    Order Specific Question:   Indication/Reason    Answer:   Hip Joint Pain (Arthralgia)   Provide equipment / supplies at bedside    Procedure tray: "Block Tray" (Disposable  single use) Skin infiltration needle: Regular 1.5-in, 25-G, (x1) Block Needle type: Spinal Amount/quantity: 1 Size: Long (7-inch) Gauge: 22G    Standing Status:   Standing    Number of Occurrences:   1    Order Specific Question:   Specify    Answer:   Block Tray   Chronic Opioid Analgesic:  None MME/day: 0 mg/day   Medications ordered for procedure: Meds ordered this encounter  Medications   iohexol (OMNIPAQUE) 180 MG/ML injection 10 mL    Must be Myelogram-compatible. If not available, you may substitute with a water-soluble, non-ionic, hypoallergenic, myelogram-compatible radiological contrast medium.   lidocaine (XYLOCAINE) 2 % (with pres) injection 400 mg   pentafluoroprop-tetrafluoroeth (GEBAUERS) aerosol   lactated ringers infusion   midazolam (VERSED) 5 MG/5ML injection 0.5-2 mg    Make sure Flumazenil is available in the pyxis when using this medication. If oversedation occurs, administer 0.2 mg IV over 15 sec. If after 45 sec no response, administer 0.2 mg again over 1 min; may repeat at 1 min intervals; not to exceed 4 doses (1 mg)   fentaNYL (SUBLIMAZE) injection 25-50 mcg    Make sure Narcan is available in  the pyxis when using this medication. In the event of respiratory depression (RR< 8/min): Titrate NARCAN (naloxone) in increments of 0.1 to 0.2 mg IV at 2-3 minute intervals, until desired degree of reversal.    ropivacaine (PF) 2 mg/mL (0.2%) (NAROPIN) injection 9 mL   methylPREDNISolone acetate (DEPO-MEDROL) injection 80 mg   ondansetron (ZOFRAN) injection 4 mg   Medications administered: We administered iohexol, lidocaine, pentafluoroprop-tetrafluoroeth, lactated ringers, midazolam, ropivacaine (PF) 2 mg/mL (0.2%), methylPREDNISolone acetate, and ondansetron.  See the medical record for exact dosing, route, and time of administration.  Follow-up plan:   Return in about 2 weeks (around 12/09/2022) for (Face2F), (PPE).       Interventional Therapies  Risk Factors  Considerations  Medical Comorbidities:     Planned  Pending:   Diagnostic right IA hip joint (anterosuperior acetabulum) + right hamstring tendon insertion injection #1    Under consideration:   Diagnostic right IA hip joint (anterosuperior acetabulum) + right hamstring tendon insertion injection #1  Diagnostic right L3 and L4 TFESI #1  Diagnostic interlaminar L2-3 LESI #1  Diagnostic bilateral lumbar facet MBB #1    Completed:   None at this time   Therapeutic  Palliative (PRN) options:   None established   Completed by other providers:   Diagnostic/therapeutic right intra-articular steroid hip joint injection x1         Recent Visits Date Type Provider Dept  11/10/22 Office Visit Delano Metz, MD [facility]  10/25/22 Office Visit Delano Metz, MD [facility]  Showing recent visits within past 90 days and meeting all other requirements Today's Visits Date Type Provider Dept  11/25/22 Procedure visit Delano Metz, MD [facility]  Showing today's visits and meeting all other requirements Future Appointments Date Type Provider Dept  12/14/22 Appointment Delano Metz, MD [facility]  Showing future appointments within next 90 days and meeting all other requirements  Disposition: Discharge home  Discharge (Date  Time): 11/25/2022; 1022 hrs.    Primary Care Physician: Erasmo Downer, MD Location: [facility] Note by: Oswaldo Done, MD (TTS technology used. I apologize for any typographical errors that were not detected and corrected.) Date: 11/25/2022; Time: 10:46 AM  Disclaimer:  Medicine is not an Visual merchandiser. The only guarantee in medicine is that nothing is guaranteed. It is important to note that the decision to proceed with this intervention was based on the information collected from the patient. The Data and conclusions were drawn from the patient's questionnaire, the interview, and the physical examination. Because the information was provided in large part by the patient, it cannot be guaranteed that it has not been purposely or unconsciously manipulated. Every effort has been made to obtain as much relevant data as possible for this evaluation. It is important to note that the conclusions that lead to this procedure are derived in large part from the available data. Always take into account that the treatment will also be dependent on availability of resources and existing treatment guidelines, considered by other Pain Management Practitioners as being common knowledge and practice, at the time of the intervention. For Medico-Legal purposes, it is also important to point out that variation in procedural techniques and pharmacological choices are the acceptable norm. The indications, contraindications, technique, and results of the above procedure should only be interpreted and judged by a Board-Certified Interventional Pain Specialist with extensive familiarity and expertise in the same exact procedure and technique.

## 2022-11-25 NOTE — Progress Notes (Signed)
Safety precautions to be maintained throughout the outpatient stay will include: orient to surroundings, keep bed in low position, maintain call bell within reach at all times, provide assistance with transfer out of bed and ambulation.  

## 2022-11-26 ENCOUNTER — Telehealth: Payer: Self-pay

## 2022-11-26 NOTE — Telephone Encounter (Signed)
No problems post procedure. 

## 2022-12-12 NOTE — Progress Notes (Unsigned)
PROVIDER NOTE: Information contained herein reflects review and annotations entered in association with encounter. Interpretation of such information and data should be left to medically-trained personnel. Information provided to patient can be located elsewhere in the medical record under "Patient Instructions". Document created using STT-dictation technology, any transcriptional errors that may result from process are unintentional.    Patient: Norma Wall  Service Category: E/M  Provider: Oswaldo Done, MD  DOB: 1970/02/03  DOS: 12/14/2022  Referring Provider: Erasmo Downer, MD  MRN: [id number]  Specialty: Interventional Pain Management  PCP: Erasmo Downer, MD  Type: Established Patient  Setting: Ambulatory outpatient    Location: Office  Delivery: Face-to-face     HPI  Norma Wall, a 52 y.o. year old female, is here today because of her Chronic hip pain, right [M25.551, G89.29]. [name] primary complain today is No chief complaint on file.  Pertinent problems: [name] has Migraine; Pelvic pain; Left lower quadrant abdominal pain; Right upper quadrant abdominal pain; Arthralgia of right hip; Greater trochanteric pain syndrome of lower extremity (Right); Multilevel lumbosacral spondylosis with radiculopathy; Abnormal MRI, Hip (Right) (09/04/2022); Abnormal MRI, lumbar spine (09/03/2022); Chronic pain syndrome; Abnormal MRI, cervical spine (01/18/2018); Chronic hip pain (1ry area of Pain) (Right); Iliopsoas bursitis of hip (Right); Groin pain (Right); Chronic low back pain (Bilateral) w/o sciatica; Chronic low back pain (2ry area of Pain) (Midline) w/o sciatica; Lumbar facet joint pain; Grade 1 Retrolisthesis of Lumbar region (L2/L3, L3/L4, L4/L5); Lumbar facet joint hypertrophy (Multilevel) (Bilateral); Osteoarthritis of facet joint of lumbar spine; Lumbosacral facet joint syndrome; Osteoarthritis of lumbar spine; Spondylosis without myelopathy or  radiculopathy, lumbosacral region; Lumbar lateral recess stenosis (Bilateral: L2-3, L3-4, L4-5); Lumbosacral foraminal stenosis (Bilateral: L3-4, L4-5); Ligamentum flavum hypertrophy (L4-5); Osteoarthritis of hip (Right); Enthesopathy of hip region (Right); Hamstring tendinitis of thigh (Right); Iliopsoas bursitis of hip (Left); Cervical disc protrusion at C5-6 (Left); Cervical disc herniation/extrusion at C6-7 level  (Right); Cervical C8 nerve root impingement (Right); Cervical lateral recess stenosis (Left: C5-6) (Right:C5-6); DDD (degenerative disc disease), cervical; and DDD (degenerative disc disease), lumbar on their pertinent problem list. Pain Assessment: Severity of   is reported as a  /10. Location:    / . Onset:  . Quality:  . Timing:  . Modifying factor(s):  [name] Vitals:  vitals were not taken for this visit.  BMI: Estimated body mass index is 37.28 kg/m as calculated from the following:   Height as of 11/25/22: 5\' 7"  (1.702 m).   Weight as of 11/25/22: 238 lb (108 kg). Last encounter: 11/10/2022. Last procedure: 11/25/2022.  Reason for encounter: post-procedure evaluation and assessment. ***  Post-procedure evaluation   Type: Hip & bursae injection #1  Laterality: Right (-RT)  Bursae: Trochanteric  Laterality: Right (-RT)  Approach: Percutaneous  anterolateral  approach. Level: Lower pelvic and hip joint level.  Imaging: Fluoroscopy-guided         Anesthesia: Local anesthesia (1-2% Lidocaine) Anxiolysis: IV Versed 2.0 mg Sedation: Moderate Sedation None required. No Fentanyl administered.         DOS: 11/25/2022  Performed by: Oswaldo Done, MD  Purpose: Diagnostic/Therapeutic Indications: Hip pain severe enough to impact quality of life or function. Rationale (medical necessity): procedure needed and proper for the diagnosis and/or treatment of Ms. Manwarren's medical symptoms and needs. 1. Chronic hip pain (1ry area of Pain) (Right)   2. Osteoarthritis of hip (Right)   3.  Iliopsoas bursitis of hip (Right)   4. Groin pain (  Right)   5. Hamstring tendinitis of thigh (Right)   6. Greater trochanteric pain syndrome of lower extremity (Right)   7. History of postoperative nausea and vomiting   8. Chronic hip pain, right   9. Primary osteoarthritis of right hip   10. Iliopsoas bursitis of right hip   11. Groin pain, right   12. Hamstring tendinitis of right thigh   13. Greater trochanteric pain syndrome of right lower extremity    NAS-11 Pain score:   Pre-procedure: 2 /10   Post-procedure: 0-No pain/10       Effectiveness:  Initial hour after procedure:   ***. Subsequent 4-6 hours post-procedure:   ***. Analgesia past initial 6 hours:   ***. Ongoing improvement:  Analgesic:  *** Function:    ***    ROM:    ***     Pharmacotherapy Assessment  Analgesic: None MME/day: 0 mg/day   Monitoring: [place] PMP: PDMP reviewed during this encounter.       Pharmacotherapy: No side-effects or adverse reactions reported. Compliance: No problems identified. Effectiveness: Clinically acceptable.  No notes on file  No results found for: "CBDTHCR" No results found for: "D8THCCBX" No results found for: "D9THCCBX"  UDS:  Summary  Date Value Ref Range Status  10/26/2022 Note  Final    Comment:    ==================================================================== Compliance Drug Analysis, Ur ==================================================================== Test                             Result       Flag       Units  Drug Present and Declared for Prescription Verification   Gabapentin                     PRESENT      EXPECTED  Drug Absent but Declared for Prescription Verification   Acetaminophen                  Not Detected UNEXPECTED    Acetaminophen, as indicated in the declared medication list, is not    always detected even when used as directed.    Diclofenac                     Not Detected UNEXPECTED    Diclofenac, as indicated in the declared  medication list, is not    always detected even when used as directed.    Ibuprofen                      Not Detected UNEXPECTED    Ibuprofen, as indicated in the declared medication list, is not    always detected even when used as directed.  ==================================================================== Test                      Result    Flag   Units      Ref Range   Creatinine              45               mg/dL      >=65 ==================================================================== Declared Medications:  The flagging and interpretation on this report are based on the  following declared medications.  Unexpected results may arise from  inaccuracies in the declared medications.   **Note: The testing scope of this panel includes these medications:   Gabapentin (Neurontin)   **Note: The testing  scope of this panel does not include small to  moderate amounts of these reported medications:   Acetaminophen (Tylenol)  Diclofenac (Voltaren)  Ibuprofen (Advil)   **Note: The testing scope of this panel does not include the  following reported medications:   Calcium  Sumatriptan (Imitrex)  Vitamin D3 ==================================================================== For clinical consultation, please call [phone]. ====================================================================       ROS  Constitutional: Denies any fever or chills Gastrointestinal: No reported hemesis, hematochezia, vomiting, or acute GI distress Musculoskeletal: Denies any acute onset joint swelling, redness, loss of ROM, or weakness Neurological: No reported episodes of acute onset apraxia, aphasia, dysarthria, agnosia, amnesia, paralysis, loss of coordination, or loss of consciousness  Medication Review  Calcium Carbonate, SUMAtriptan, acetaminophen, diclofenac, gabapentin, ibuprofen, and vitamin D3  History Review  Allergy: Ms. Webb has No Known Allergies. Drug: Ms. Diekman   reports no history of drug use. Alcohol:  reports current alcohol use. Tobacco:  reports that she quit smoking about 25 years ago. Her smoking use included cigarettes. She has never used smokeless tobacco. Social: Ms. Plazola  reports that she quit smoking about 25 years ago. Her smoking use included cigarettes. She has never used smokeless tobacco. She reports current alcohol use. She reports that she does not use drugs. Medical:  has a past medical history of Abdominal pain, right upper quadrant (2013), Anxiety, Cancer (HCC), COVID-19 (2021), Endometriosis (01/2013), Gallbladder polyp, Lipidemia (2020), Lymphedema (01/2019), Migraine headache, Ovarian cyst (2014), PONV (postoperative nausea and vomiting), and Vertigo. Surgical: Ms. Ritz  has a past surgical history that includes Ablation (2012); Neck surgery (02/22/2018); Ovarian cyst removal; Laparoscopic ovarian cystectomy (Right, 10/19/2018); Cholecystectomy (N/A, 10/19/2018); Colonoscopy with propofol (N/A, 07/21/2020); polypectomy (N/A, 07/21/2020); Total laparoscopic hysterectomy with salpingectomy (Bilateral, 09/25/2020); Cystoscopy (N/A, 09/25/2020); and Cystoscopy w/ ureteral stent placement (Right, 09/25/2020). Family: family history includes Diabetes in her maternal grandmother and mother; Lung cancer in her father; Stroke in her mother.  Laboratory Chemistry Profile   Renal Lab Results  Component Value Date   BUN 17 10/25/2022   CREATININE 0.84 10/25/2022   BCR 13 03/01/2022   GFRAA 89 01/11/2020   GFRNONAA >60 10/25/2022    Hepatic Lab Results  Component Value Date   AST 35 10/25/2022   ALT 36 10/25/2022   ALBUMIN 4.0 10/25/2022   ALKPHOS 41 10/25/2022   AMYLASE 76 03/02/2016   LIPASE 28 03/01/2022    Electrolytes Lab Results  Component Value Date   NA 138 10/25/2022   K 4.3 10/25/2022   CL 104 10/25/2022   CALCIUM 9.3 10/25/2022   MG 2.4 10/25/2022    Bone Lab Results  Component Value Date   VD25OH 26.42 (L) 10/25/2022     Inflammation (CRP: Acute Phase) (ESR: Chronic Phase) Lab Results  Component Value Date   CRP 0.9 10/25/2022   ESRSEDRATE 16 10/25/2022         Note: Above Lab results reviewed.  Recent Imaging Review  DG PAIN CLINIC C-ARM 1-60 MIN NO REPORT Fluoro was used, but no Radiologist interpretation will be provided.  Please refer to "NOTES" tab for provider progress note. Note: Reviewed        Physical Exam  General appearance: Well nourished, well developed, and well hydrated. In no apparent acute distress Mental status: Alert, oriented x 3 (person, place, & time)       Respiratory: No evidence of acute respiratory distress Eyes: PERLA Vitals: LMP 09/08/2020  BMI: Estimated body mass index is 37.28 kg/m as calculated from the  following:   Height as of 11/25/22: 5\' 7"  (1.702 m).   Weight as of 11/25/22: 238 lb (108 kg). Ideal: Patient weight not recorded  Assessment   Diagnosis Status  1. Chronic hip pain (1ry area of Pain) (Right)   2. Groin pain (Right)   3. Iliopsoas bursitis of hip (Right)   4. Hamstring tendinitis of thigh (Right)   5. Greater trochanteric pain syndrome of lower extremity (Right)   6. Osteoarthritis of hip (Right)   7. Postop check    Controlled Controlled Controlled   Updated Problems: No problems updated.  Plan of Care  Problem-specific:  No problem-specific Assessment & Plan notes found for this encounter.  Ms. Wilmarie Mckillop has a current medication list which includes the following long-term medication(s): calcium carbonate, gabapentin, and sumatriptan.  Pharmacotherapy (Medications Ordered): No orders of the defined types were placed in this encounter.  Orders:  No orders of the defined types were placed in this encounter.  Follow-up plan:   No follow-ups on file.      Interventional Therapies  Risk Factors  Considerations  Medical Comorbidities:     Planned  Pending:   Diagnostic right IA hip joint (anterosuperior  acetabulum) + right hamstring tendon insertion injection #1    Under consideration:   Diagnostic right IA hip joint (anterosuperior acetabulum) + right hamstring tendon insertion injection #1  Diagnostic right L3 and L4 TFESI #1  Diagnostic interlaminar L2-3 LESI #1  Diagnostic bilateral lumbar facet MBB #1    Completed:   None at this time   Therapeutic  Palliative (PRN) options:   None established   Completed by other providers:   Diagnostic/therapeutic right intra-articular steroid hip joint injection x1          Recent Visits Date Type Provider Dept  11/25/22 Procedure visit Delano Metz, MD [facility]  11/10/22 Office Visit Delano Metz, MD [facility]  10/25/22 Office Visit Delano Metz, MD [facility]  Showing recent visits within past 90 days and meeting all other requirements Future Appointments Date Type Provider Dept  12/14/22 Appointment Delano Metz, MD [facility]  Showing future appointments within next 90 days and meeting all other requirements  I discussed the assessment and treatment plan with the patient. The patient was provided an opportunity to ask questions and all were answered. The patient agreed with the plan and demonstrated an understanding of the instructions.  Patient advised to call back or seek an in-person evaluation if the symptoms or condition worsens.  Duration of encounter: *** minutes.  Total time on encounter, as per AMA guidelines included both the face-to-face and non-face-to-face time personally spent by the physician and/or other qualified health care professional(s) on the day of the encounter (includes time in activities that require the physician or other qualified health care professional and does not include time in activities normally performed by clinical staff). Physician's time may include the following activities when performed: Preparing to see the patient  (e.g., pre-charting review of records, searching for previously ordered imaging, lab work, and nerve conduction tests) Review of prior analgesic pharmacotherapies. Reviewing PMP Interpreting ordered tests (e.g., lab work, imaging, nerve conduction tests) Performing post-procedure evaluations, including interpretation of diagnostic procedures Obtaining and/or reviewing separately obtained history Performing a medically appropriate examination and/or evaluation Counseling and educating the patient/family/caregiver Ordering medications, tests, or procedures Referring and communicating with other health care professionals (when not separately reported) Documenting clinical information in the electronic or other health record  Independently interpreting results (not separately reported) and communicating results to the patient/ family/caregiver Care coordination (not separately reported)  Note by: Oswaldo Done, MD Date: 12/14/2022; Time: 3:52 PM

## 2022-12-14 ENCOUNTER — Encounter: Payer: Self-pay | Admitting: Pain Medicine

## 2022-12-14 ENCOUNTER — Ambulatory Visit: Payer: Federal, State, Local not specified - PPO | Attending: Pain Medicine | Admitting: Pain Medicine

## 2022-12-14 VITALS — BP 121/79 | HR 66 | Temp 97.9°F | Ht 67.0 in | Wt 238.0 lb

## 2022-12-14 DIAGNOSIS — M7071 Other bursitis of hip, right hip: Secondary | ICD-10-CM | POA: Diagnosis not present

## 2022-12-14 DIAGNOSIS — M4310 Spondylolisthesis, site unspecified: Secondary | ICD-10-CM | POA: Insufficient documentation

## 2022-12-14 DIAGNOSIS — M25551 Pain in right hip: Secondary | ICD-10-CM | POA: Diagnosis not present

## 2022-12-14 DIAGNOSIS — Z09 Encounter for follow-up examination after completed treatment for conditions other than malignant neoplasm: Secondary | ICD-10-CM | POA: Diagnosis not present

## 2022-12-14 DIAGNOSIS — M545 Low back pain, unspecified: Secondary | ICD-10-CM | POA: Insufficient documentation

## 2022-12-14 DIAGNOSIS — M4807 Spinal stenosis, lumbosacral region: Secondary | ICD-10-CM | POA: Diagnosis not present

## 2022-12-14 DIAGNOSIS — M76891 Other specified enthesopathies of right lower limb, excluding foot: Secondary | ICD-10-CM | POA: Diagnosis not present

## 2022-12-14 DIAGNOSIS — M48061 Spinal stenosis, lumbar region without neurogenic claudication: Secondary | ICD-10-CM | POA: Insufficient documentation

## 2022-12-14 DIAGNOSIS — R1031 Right lower quadrant pain: Secondary | ICD-10-CM | POA: Insufficient documentation

## 2022-12-14 DIAGNOSIS — G8929 Other chronic pain: Secondary | ICD-10-CM | POA: Diagnosis not present

## 2022-12-14 DIAGNOSIS — M51362 Other intervertebral disc degeneration, lumbar region with discogenic back pain and lower extremity pain: Secondary | ICD-10-CM | POA: Insufficient documentation

## 2022-12-14 DIAGNOSIS — M1611 Unilateral primary osteoarthritis, right hip: Secondary | ICD-10-CM | POA: Insufficient documentation

## 2022-12-14 NOTE — Patient Instructions (Signed)

## 2022-12-14 NOTE — Progress Notes (Signed)
Safety precautions to be maintained throughout the outpatient stay will include: orient to surroundings, keep bed in low position, maintain call bell within reach at all times, provide assistance with transfer out of bed and ambulation.  

## 2022-12-23 ENCOUNTER — Ambulatory Visit: Payer: Federal, State, Local not specified - PPO | Admitting: Pain Medicine

## 2022-12-28 ENCOUNTER — Ambulatory Visit: Payer: Federal, State, Local not specified - PPO | Admitting: Pain Medicine

## 2023-02-07 NOTE — Patient Instructions (Signed)

## 2023-02-07 NOTE — Progress Notes (Signed)
 PROVIDER NOTE: Interpretation of information contained herein should be left to medically-trained personnel. Specific patient instructions are provided elsewhere under Patient Instructions section of medical record. This document was created in part using STT-dictation technology, any transcriptional errors that may result from this process are unintentional.  Patient: Norma Wall Type: Established DOB: May 27, 1970 MRN: [id number] PCP: Norma Norma Wall  Service: Procedure DOS: 02/08/2023 Setting: Ambulatory Location: Ambulatory outpatient facility Delivery: Face-to-face Provider: Eric DELENA Como, Wall Specialty: Interventional Pain Management Specialty designation: 09 Location: Outpatient facility Ref. Prov.: Norma Wall       Interventional Therapy   Type: Lumbar epidural steroid injection (LESI) (interlaminar) #1    Laterality: Right   Level:  L3-4 Level.  Imaging: Fluoroscopic guidance Spinal ([id number]) Anesthesia: Local anesthesia (1-2% Lidocaine ) Anxiolysis: IV Versed  2.0 mg Sedation: Moderate Sedation None required. No Fentanyl  administered.         DOS: 02/08/2023  Performed by: Norma DELENA Como, Wall  Purpose: Diagnostic/Therapeutic Indications: Lumbar radicular pain of intraspinal etiology of more than 4 weeks that has failed to respond to conservative therapy and is severe enough to impact quality of life or function. 1. Chronic low back pain (2ry area of Pain) (Midline) w/o sciatica   2. Chronic hip pain (1ry area of Pain) (Right)   3. Degeneration of intervertebral disc of lumbar region with discogenic back pain and lower extremity pain   4. Grade 1 Retrolisthesis of Lumbar region (L2/L3, L3/L4, L4/L5)   5. 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 Lumbar lateral recess stenosis (Bilateral: L2-3, L3-4, L4-5)   7. Lumbosacral foraminal stenosis (Bilateral: L3-4, L4-5)   8. Multilevel lumbosacral spondylosis with radiculopathy    NAS-11  Pain score:   Pre-procedure: 5 /10   Post-procedure: 2 /10      Position / Prep / Materials:  Position: Prone w/ head of the table raised (slight reverse trendelenburg) to facilitate breathing.  Prep solution: ChloraPrep (2% chlorhexidine  gluconate and 70% isopropyl alcohol) Prep Area: Entire Posterior Lumbar Region from lower scapular tip down to mid buttocks area and from flank to flank. Materials:  Tray: Epidural tray Needle(s):  Type: Epidural needle (Tuohy) Gauge (G):  17 Length: Regular (3.5-in) Qty: 1   H&P (Pre-op Assessment):  Norma Wall is a 53 y.o. (year old), female patient, seen today for interventional treatment. She  has a past surgical history that includes Ablation (2012); Neck surgery (02/22/2018); Ovarian cyst removal; Laparoscopic ovarian cystectomy (Right, 10/19/2018); Cholecystectomy (N/A, 10/19/2018); Colonoscopy with propofol  (N/A, 07/21/2020); polypectomy (N/A, 07/21/2020); Total laparoscopic hysterectomy with salpingectomy (Bilateral, 09/25/2020); Cystoscopy (N/A, 09/25/2020); and Cystoscopy w/ ureteral stent placement (Right, 09/25/2020). Norma Wall has a current medication list which includes the following prescription(s): acetaminophen , calcium carbonate, vitamin d3, ibuprofen , and sumatriptan . Her primarily concern today is the Back Pain and Hip Pain (right)  Initial Vital Signs:  Pulse/HCG Rate: 83ECG Heart Rate: 66 (NSR) Temp: (!) 97.5 F (36.4 C) Resp: 19 BP: 115/84 SpO2: 99 %  BMI: Estimated body mass index is 36.81 kg/m as calculated from the following:   Height as of this encounter: 5' 7 (1.702 m).   Weight as of this encounter: 235 lb (106.6 kg).  Risk Assessment: Allergies: Reviewed. She has no known allergies.  Allergy Precautions: None required Coagulopathies: Reviewed. None identified.  Blood-thinner therapy: None at this time Active Infection(s): Reviewed. None identified. Norma Wall is afebrile  Site Confirmation: Norma Wall was asked to confirm  the procedure and laterality before marking the site Procedure  checklist: Completed Consent: Before the procedure and under the influence of no sedative(s), amnesic(s), or anxiolytics, the patient was informed of the treatment options, risks and possible complications. To fulfill our ethical and legal obligations, as recommended by the American Medical Association's Code of Ethics, I have informed the patient of my clinical impression; the nature and purpose of the treatment or procedure; the risks, benefits, and possible complications of the intervention; the alternatives, including doing nothing; the risk(s) and benefit(s) of the alternative treatment(s) or procedure(s); and the risk(s) and benefit(s) of doing nothing. The patient was provided information about the general risks and possible complications associated with the procedure. These may include, but are not limited to: failure to achieve desired goals, infection, bleeding, organ or nerve damage, allergic reactions, paralysis, and death. In addition, the patient was informed of those risks and complications associated to Spine-related procedures, such as failure to decrease pain; infection (i.e.: Meningitis, epidural or intraspinal abscess); bleeding (i.e.: epidural hematoma, subarachnoid hemorrhage, or any other type of intraspinal or peri-dural bleeding); organ or nerve damage (i.e.: Any type of peripheral nerve, nerve root, or spinal cord injury) with subsequent damage to sensory, motor, and/or autonomic systems, resulting in permanent pain, numbness, and/or weakness of one or several areas of the body; allergic reactions; (i.e.: anaphylactic reaction); and/or death. Furthermore, the patient was informed of those risks and complications associated with the medications. These include, but are not limited to: allergic reactions (i.e.: anaphylactic or anaphylactoid reaction(s)); adrenal axis suppression; blood sugar elevation that in diabetics may  result in ketoacidosis or comma; water  retention that in patients with history of congestive heart failure may result in shortness of breath, pulmonary edema, and decompensation with resultant heart failure; weight gain; swelling or edema; medication-induced neural toxicity; particulate matter embolism and blood vessel occlusion with resultant organ, and/or nervous system infarction; and/or aseptic necrosis of one or more joints. Finally, the patient was informed that Medicine is not an exact science; therefore, there is also the possibility of unforeseen or unpredictable risks and/or possible complications that may result in a catastrophic outcome. The patient indicated having understood very clearly. We have given the patient no guarantees and we have made no promises. Enough time was given to the patient to ask questions, all of which were answered to the patient's satisfaction. Ms. Criswell has indicated that she wanted to continue with the procedure. Attestation: I, the ordering provider, attest that I have discussed with the patient the benefits, risks, side-effects, alternatives, likelihood of achieving goals, and potential problems during recovery for the procedure that I have provided informed consent. Date  Time: 02/08/2023  9:04 AM  Pre-Procedure Preparation:  Monitoring: As per clinic protocol. Respiration, ETCO2, SpO2, BP, heart rate and rhythm monitor placed and checked for adequate function Safety Precautions: Patient was assessed for positional comfort and pressure points before starting the procedure. Time-out: I initiated and conducted the Time-out before starting the procedure, as per protocol. The patient was asked to participate by confirming the accuracy of the Time Out information. Verification of the correct person, site, and procedure were performed and confirmed by me, the nursing staff, and the patient. Time-out conducted as per Joint Commission's Universal Protocol  (UP.01.01.01). Time: 1002 Start Time: 1002 hrs.  Description/Narrative of Procedure:          Target: Epidural space via interlaminar opening, initially targeting the lower laminar border of the superior vertebral body. Region: Lumbar Approach: Percutaneous paravertebral  Rationale (medical necessity): procedure needed and proper for the  diagnosis and/or treatment of the patient's medical symptoms and needs. Procedural Technique Safety Precautions: Aspiration looking for blood return was conducted prior to all injections. At no point did we inject any substances, as a needle was being advanced. No attempts were made at seeking any paresthesias. Safe injection practices and needle disposal techniques used. Medications properly checked for expiration dates. SDV (single dose vial) medications used. Description of the Procedure: Protocol guidelines were followed. The procedure needle was introduced through the skin, ipsilateral to the reported pain, and advanced to the target area. Bone was contacted and the needle walked caudad, until the lamina was cleared. The epidural space was identified using "loss-of-resistance technique" with 2-3 ml of PF-NaCl (0.9% NSS), in a 5cc LOR glass syringe.  Vitals:   02/08/23 1000 02/08/23 1005 02/08/23 1010 02/08/23 1019  BP: 118/80 106/71 105/79 120/69  Pulse:    65  Resp: 16 15 18 16   Temp:      SpO2: 100% 100% 100% 99%  Weight:      Height:        Start Time: 1002 hrs. End Time: 1009 hrs.  Imaging Guidance (Spinal):          Type of Imaging Technique: Fluoroscopy Guidance (Spinal) Indication(s): Fluoroscopy guidance for needle placement to enhance accuracy in procedures requiring precise needle localization for targeted delivery of medication in or near specific anatomical locations not easily accessible without such real-time imaging assistance. Exposure Time: Please see nurses notes. Contrast: Before injecting any contrast, we confirmed that the  patient did not have an allergy to iodine , shellfish, or radiological contrast. Once satisfactory needle placement was completed at the desired level, radiological contrast was injected. Contrast injected under live fluoroscopy. No contrast complications. See chart for type and volume of contrast used. Fluoroscopic Guidance: I was personally present during the use of fluoroscopy. Tunnel Vision Technique used to obtain the best possible view of the target area. Parallax error corrected before commencing the procedure. Direction-depth-direction technique used to introduce the needle under continuous pulsed fluoroscopy. Once target was reached, antero-posterior, oblique, and lateral fluoroscopic projection used confirm needle placement in all planes. Images permanently stored in EMR. Interpretation: I personally interpreted the imaging intraoperatively. Adequate needle placement confirmed in multiple planes. Appropriate spread of contrast into desired area was observed. No evidence of afferent or efferent intravascular uptake. No intrathecal or subarachnoid spread observed. Permanent images saved into the patient's record.  Antibiotic Prophylaxis:   Anti-infectives (From admission, onward)    None      Indication(s): None identified  Post-operative Assessment:  Post-procedure Vital Signs:  Pulse/HCG Rate: 6567 Temp: (!) 97.5 F (36.4 C) Resp: 16 BP: 120/69 SpO2: 99 %  EBL: None  Complications: No immediate post-treatment complications observed by team, or reported by patient.  Note: The patient tolerated the entire procedure well. A repeat set of vitals were taken after the procedure and the patient was kept under observation following institutional policy, for this type of procedure. Post-procedural neurological assessment was performed, showing return to baseline, prior to discharge. The patient was provided with post-procedure discharge instructions, including a section on how to  identify potential problems. Should any problems arise concerning this procedure, the patient was given instructions to immediately contact us , at any time, without hesitation. In any case, we plan to contact the patient by telephone for a follow-up status report regarding this interventional procedure.  Comments:  No additional relevant information.  Plan of Care (POC)  Orders:  Orders Placed This Encounter  Procedures   Lumbar Epidural Injection    Scheduling Instructions:     Procedure: Interlaminar LESI L3-4     Laterality: Midline     Sedation: With Sedation     Timeframe: Today    Where will this procedure be performed?:   [facility]   [facility] C-ARM 1-60 MIN NO REPORT    Intraoperative interpretation by procedural physician at [facility].    Standing Status:   Standing    Number of Occurrences:   1    Reason for exam::   Assistance in needle guidance and placement for procedures requiring needle placement in or near specific anatomical locations not easily accessible without such assistance.   Informed Consent Details: Physician/Practitioner Attestation; Transcribe to consent form and obtain patient signature    Note: Always confirm laterality of pain with Ms. Locher, before procedure. Transcribe to consent form and obtain patient signature.    Physician/Practitioner attestation of informed consent for procedure/surgical case:   I, the physician/practitioner, attest that I have discussed with the patient the benefits, risks, side effects, alternatives, likelihood of achieving goals and potential problems during recovery for the procedure that I have provided informed consent.    Procedure:   Lumbar epidural steroid injection under fluoroscopic guidance    Physician/Practitioner performing the procedure:   Elzia Hott A. Tanya, Wall    Indication/Reason:   Low back and/or lower extremity pain secondary to lumbar radiculitis   Provide equipment / supplies at  bedside    Procedural tray: Epidural Tray (Disposable  single use) Skin infiltration needle: Regular 1.5-in, 25-G, (x1) Block needle size: Regular standard Catheter: No catheter required    Standing Status:   Standing    Number of Occurrences:   1    Specify:   Epidural Tray   Saline lock IV    Have LR [phone] mL available and administer at 125 mL/hr if patient becomes hypotensive.    Standing Status:   Standing    Number of Occurrences:   1   Chronic Opioid Analgesic:   None MME/day: 0 mg/day   Medications ordered for procedure: Meds ordered this encounter  Medications   iohexol  (OMNIPAQUE ) 180 MG/ML injection 10 mL    Must be Myelogram-compatible. If not available, you may substitute with a water -soluble, non-ionic, hypoallergenic, myelogram-compatible radiological contrast medium.   lidocaine  (XYLOCAINE ) 2 % (with pres) injection 400 mg   pentafluoroprop-tetrafluoroeth (GEBAUERS) aerosol   sodium chloride  flush (NS) 0.9 % injection 2 mL   ropivacaine  (PF) 2 mg/mL (0.2%) (NAROPIN ) injection 2 mL   triamcinolone  acetonide (KENALOG -40) injection 40 mg   ondansetron  (ZOFRAN ) injection 4 mg   midazolam  (VERSED ) 5 MG/5ML injection 0.5-2 mg    Make sure Flumazenil is available in the pyxis when using this medication. If oversedation occurs, administer 0.2 mg IV over 15 sec. If after 45 sec no response, administer 0.2 mg again over 1 min; may repeat at 1 min intervals; not to exceed 4 doses (1 mg)   Medications administered: We administered iohexol , lidocaine , pentafluoroprop-tetrafluoroeth, sodium chloride  flush, ropivacaine  (PF) 2 mg/mL (0.2%), triamcinolone  acetonide, ondansetron , and midazolam .  See the medical record for exact dosing, route, and time of administration.  Follow-up plan:   Return in about 2 weeks (around 02/22/2023) for (Face2F), (PPE).       Interventional Therapies  Risk Factors  Considerations  Medical Comorbidities:     Planned  Pending:   Diagnostic  (ML) L3-4 LESI #1 (02/08/2023)  Under consideration:   Diagnostic right IA hip joint (anterosuperior acetabulum) + right hamstring tendon insertion injection #1  Diagnostic right L3 and L4 TFESI #1  Diagnostic interlaminar L2-3 LESI #1  Diagnostic bilateral lumbar facet MBB #1    Completed:   Diagnostic right IA hip (anterosuperior) + right hamstring peri-tendinal inj. x1 (11/25/2022) (100/100/80/80)    Therapeutic  Palliative (PRN) options:   None established   Completed by other providers:   Diagnostic/therapeutic right IA steroid hip inj. x1       Recent Visits Date Type Provider Dept  12/14/22 Office Visit Tanya Glisson, Wall [facility]  11/25/22 Procedure visit Tanya Glisson, Wall [facility]  11/10/22 Office Visit Tanya Glisson, Wall [facility]  Showing recent visits within past 90 days and meeting all other requirements Today's Visits Date Type Provider Dept  02/08/23 Procedure visit Tanya Glisson, Wall [facility]  Showing today's visits and meeting all other requirements Future Appointments Date Type Provider Dept  02/22/23 Appointment Tanya Glisson, Wall [facility]  Showing future appointments within next 90 days and meeting all other requirements  Disposition: Discharge home  Discharge (Date  Time): 02/08/2023; 1025 hrs.   Primary Care Physician: Norma Norma Wall Location: [facility] Note by: Glisson DELENA Tanya, Wall (TTS technology used. I apologize for any typographical errors that were not detected and corrected.) Date: 02/08/2023; Time: 11:14 AM  Disclaimer:  Medicine is not an visual merchandiser. The only guarantee in medicine is that nothing is guaranteed. It is important to note that the decision to proceed with this intervention was based on the information collected from the patient. The Data and conclusions were drawn from the patient's questionnaire, the  interview, and the physical examination. Because the information was provided in large part by the patient, it cannot be guaranteed that it has not been purposely or unconsciously manipulated. Every effort has been made to obtain as much relevant data as possible for this evaluation. It is important to note that the conclusions that lead to this procedure are derived in large part from the available data. Always take into account that the treatment will also be dependent on availability of resources and existing treatment guidelines, considered by other Pain Management Practitioners as being common knowledge and practice, at the time of the intervention. For Medico-Legal purposes, it is also important to point out that variation in procedural techniques and pharmacological choices are the acceptable norm. The indications, contraindications, technique, and results of the above procedure should only be interpreted and judged by a Board-Certified Interventional Pain Specialist with extensive familiarity and expertise in the same exact procedure and technique.

## 2023-02-08 ENCOUNTER — Ambulatory Visit: Payer: Federal, State, Local not specified - PPO | Attending: Pain Medicine | Admitting: Pain Medicine

## 2023-02-08 ENCOUNTER — Encounter: Payer: Self-pay | Admitting: Pain Medicine

## 2023-02-08 ENCOUNTER — Ambulatory Visit
Admission: RE | Admit: 2023-02-08 | Discharge: 2023-02-08 | Disposition: A | Discharge status: Home / Self Care | Payer: Federal, State, Local not specified - PPO | Source: Ambulatory Visit | Attending: Pain Medicine | Admitting: Pain Medicine

## 2023-02-08 VITALS — BP 120/69 | HR 65 | Temp 97.5°F | Resp 16 | Ht 67.0 in | Wt 235.0 lb

## 2023-02-08 DIAGNOSIS — M545 Low back pain, unspecified: Secondary | ICD-10-CM | POA: Diagnosis not present

## 2023-02-08 DIAGNOSIS — G8929 Other chronic pain: Secondary | ICD-10-CM | POA: Diagnosis not present

## 2023-02-08 DIAGNOSIS — M48061 Spinal stenosis, lumbar region without neurogenic claudication: Secondary | ICD-10-CM | POA: Diagnosis not present

## 2023-02-08 DIAGNOSIS — Z87898 Personal history of other specified conditions: Secondary | ICD-10-CM | POA: Insufficient documentation

## 2023-02-08 DIAGNOSIS — M4807 Spinal stenosis, lumbosacral region: Secondary | ICD-10-CM | POA: Diagnosis not present

## 2023-02-08 DIAGNOSIS — M51362 Other intervertebral disc degeneration, lumbar region with discogenic back pain and lower extremity pain: Secondary | ICD-10-CM | POA: Diagnosis not present

## 2023-02-08 DIAGNOSIS — M2428 Disorder of ligament, vertebrae: Secondary | ICD-10-CM | POA: Insufficient documentation

## 2023-02-08 DIAGNOSIS — M4727 Other spondylosis with radiculopathy, lumbosacral region: Secondary | ICD-10-CM | POA: Diagnosis not present

## 2023-02-08 DIAGNOSIS — M4310 Spondylolisthesis, site unspecified: Secondary | ICD-10-CM | POA: Diagnosis not present

## 2023-02-08 DIAGNOSIS — M25551 Pain in right hip: Secondary | ICD-10-CM | POA: Insufficient documentation

## 2023-02-08 MED ORDER — ONDANSETRON HCL 4 MG/2ML IJ SOLN
INTRAMUSCULAR | Status: AC
  Filled 2023-02-08: qty 2

## 2023-02-08 MED ORDER — LIDOCAINE HCL (PF) 2 % IJ SOLN
INTRAMUSCULAR | Status: AC
  Filled 2023-02-08: qty 10

## 2023-02-08 MED ORDER — TRIAMCINOLONE ACETONIDE 40 MG/ML IJ SUSP
INTRAMUSCULAR | Status: AC
  Filled 2023-02-08: qty 1

## 2023-02-08 MED ORDER — PENTAFLUOROPROP-TETRAFLUOROETH EX AERO
INHALATION_SPRAY | Freq: Once | CUTANEOUS | Status: AC
  Administered 2023-02-08: 30 via TOPICAL

## 2023-02-08 MED ORDER — SODIUM CHLORIDE (PF) 0.9 % IJ SOLN
INTRAMUSCULAR | Status: AC
  Filled 2023-02-08: qty 10

## 2023-02-08 MED ORDER — ROPIVACAINE HCL 2 MG/ML IJ SOLN
2.0000 mL | Freq: Once | INTRAMUSCULAR | Status: AC
Start: 2023-02-08 — End: 2023-02-08
  Administered 2023-02-08: 2 mL via EPIDURAL

## 2023-02-08 MED ORDER — MIDAZOLAM HCL 5 MG/5ML IJ SOLN
0.5000 mg | Freq: Once | INTRAMUSCULAR | Status: AC
  Administered 2023-02-08: 2 mg via INTRAVENOUS

## 2023-02-08 MED ORDER — IOHEXOL 180 MG/ML  SOLN
10.0000 mL | Freq: Once | INTRAMUSCULAR | Status: AC
  Administered 2023-02-08: 10 mL via EPIDURAL

## 2023-02-08 MED ORDER — FENTANYL CITRATE (PF) 100 MCG/2ML IJ SOLN
INTRAMUSCULAR | Status: AC
  Filled 2023-02-08: qty 2

## 2023-02-08 MED ORDER — ROPIVACAINE HCL 2 MG/ML IJ SOLN
INTRAMUSCULAR | Status: AC
  Filled 2023-02-08: qty 20

## 2023-02-08 MED ORDER — MIDAZOLAM HCL 5 MG/5ML IJ SOLN
INTRAMUSCULAR | Status: AC
  Filled 2023-02-08: qty 5

## 2023-02-08 MED ORDER — IOHEXOL 180 MG/ML  SOLN
INTRAMUSCULAR | Status: AC
  Filled 2023-02-08: qty 10

## 2023-02-08 MED ORDER — TRIAMCINOLONE ACETONIDE 40 MG/ML IJ SUSP
40.0000 mg | Freq: Once | INTRAMUSCULAR | Status: AC
  Administered 2023-02-08: 40 mg

## 2023-02-08 MED ORDER — SODIUM CHLORIDE 0.9% FLUSH
2.0000 mL | Freq: Once | INTRAVENOUS | Status: AC
  Administered 2023-02-08: 2 mL

## 2023-02-08 MED ORDER — ONDANSETRON HCL 4 MG/2ML IJ SOLN
4.0000 mg | Freq: Once | INTRAMUSCULAR | Status: AC
Start: 2023-02-08 — End: 2023-02-08
  Administered 2023-02-08: 4 mg via INTRAVENOUS

## 2023-02-08 MED ORDER — LIDOCAINE HCL 2 % IJ SOLN
20.0000 mL | Freq: Once | INTRAMUSCULAR | Status: AC
  Administered 2023-02-08: 100 mg

## 2023-02-08 NOTE — Progress Notes (Signed)
 Safety precautions to be maintained throughout the outpatient stay will include: orient to surroundings, keep bed in low position, maintain call bell within reach at all times, provide assistance with transfer out of bed and ambulation.

## 2023-02-09 ENCOUNTER — Telehealth: Payer: Self-pay

## 2023-02-15 ENCOUNTER — Ambulatory Visit (INDEPENDENT_AMBULATORY_CARE_PROVIDER_SITE_OTHER): Payer: Federal, State, Local not specified - PPO

## 2023-02-15 ENCOUNTER — Ambulatory Visit
Admission: RE | Admit: 2023-02-15 | Discharge: 2023-02-15 | Disposition: A | Discharge status: Home / Self Care | Payer: Federal, State, Local not specified - PPO | Source: Ambulatory Visit | Attending: Physician Assistant | Admitting: Physician Assistant

## 2023-02-15 ENCOUNTER — Telehealth: Payer: Federal, State, Local not specified - PPO | Admitting: Family Medicine

## 2023-02-15 VITALS — BP 139/89 | HR 77 | Temp 98.2°F | Resp 16 | Ht 67.0 in | Wt 235.0 lb

## 2023-02-15 DIAGNOSIS — R002 Palpitations: Secondary | ICD-10-CM

## 2023-02-15 DIAGNOSIS — J069 Acute upper respiratory infection, unspecified: Secondary | ICD-10-CM

## 2023-02-15 DIAGNOSIS — H9201 Otalgia, right ear: Secondary | ICD-10-CM | POA: Diagnosis not present

## 2023-02-15 DIAGNOSIS — R0989 Other specified symptoms and signs involving the circulatory and respiratory systems: Secondary | ICD-10-CM | POA: Diagnosis not present

## 2023-02-15 DIAGNOSIS — R0602 Shortness of breath: Secondary | ICD-10-CM | POA: Diagnosis not present

## 2023-02-15 DIAGNOSIS — R519 Headache, unspecified: Secondary | ICD-10-CM

## 2023-02-15 DIAGNOSIS — R052 Subacute cough: Secondary | ICD-10-CM

## 2023-02-15 DIAGNOSIS — R059 Cough, unspecified: Secondary | ICD-10-CM | POA: Diagnosis not present

## 2023-02-15 DIAGNOSIS — J019 Acute sinusitis, unspecified: Secondary | ICD-10-CM

## 2023-02-15 LAB — SARS CORONAVIRUS 2 BY RT PCR: SARS Coronavirus 2 by RT PCR: NEGATIVE

## 2023-02-15 MED ORDER — AMOXICILLIN-POT CLAVULANATE 875-125 MG PO TABS: 1.0000 | ORAL_TABLET | Freq: Two times a day (BID) | ORAL | 0 refills | Status: DC

## 2023-02-15 MED ORDER — IPRATROPIUM BROMIDE 0.06 % NA SOLN: 2.0000 | Freq: Four times a day (QID) | NASAL | 0 refills | Status: DC

## 2023-02-15 MED ORDER — PSEUDOEPH-BROMPHEN-DM 30-2-10 MG/5ML PO SYRP: 10.0000 mL | ORAL_SOLUTION | Freq: Four times a day (QID) | ORAL | 0 refills | Status: DC | PRN

## 2023-02-15 NOTE — Patient Instructions (Signed)
Palpitations Palpitations are feelings that your heartbeat is irregular or is faster than normal. It may feel like your heart is fluttering or skipping a beat. Palpitations may be caused by many things, including smoking, caffeine, alcohol, stress, and certain medicines or drugs. Most causes of palpitations are not serious.  However, some palpitations can be a sign of a serious problem. Further tests and a thorough medical history will be done to find the cause of your palpitations. Your provider may order tests such as an ECG, labs, an echocardiogram, or an ambulatory continuous ECG monitor. Follow these instructions at home: Pay attention to any changes in your symptoms. Let your health care provider know about them. Take these actions to help manage your symptoms: Eating and drinking Follow instructions from your health care provider about eating or drinking restrictions. You may need to avoid foods and drinks that may cause palpitations. These may include: Caffeinated coffee, tea, soft drinks, and energy drinks. Chocolate. Alcohol. Diet pills. Lifestyle     Take steps to reduce your stress and anxiety. Things that can help you relax include: Yoga. Mind-body activities, such as deep breathing, meditation, or using words and images to create positive thoughts (guided imagery). Physical activity, such as swimming, jogging, or walking. Tell your health care provider if your palpitations increase with activity. If you have chest pain or shortness of breath with activity, do not continue the activity until you are seen by your health care provider. Biofeedback. This is a method that helps you learn to use your mind to control things in your body, such as your heartbeat. Get plenty of rest and sleep. Keep a regular bed time. Do not use drugs, including cocaine or ecstasy. Do not use marijuana. Do not use any products that contain nicotine or tobacco. These products include cigarettes, chewing  tobacco, and vaping devices, such as e-cigarettes. If you need help quitting, ask your health care provider. General instructions Take over-the-counter and prescription medicines only as told by your health care provider. Keep all follow-up visits. This is important. These may include visits for further testing if palpitations do not go away or get worse. Contact a health care provider if: You continue to have a fast or irregular heartbeat for a long period of time. You notice that your palpitations occur more often. Get help right away if: You have chest pain or shortness of breath. You have a severe headache. You feel dizzy or you faint. These symptoms may represent a serious problem that is an emergency. Do not wait to see if the symptoms will go away. Get medical help right away. Call your local emergency services (911 in the [place] Do not drive yourself to the hospital. Summary Palpitations are feelings that your heartbeat is irregular or is faster than normal. It may feel like your heart is fluttering or skipping a beat. Palpitations may be caused by many things, including smoking, caffeine, alcohol, stress, certain medicines, and drugs. Further tests and a thorough medical history may be done to find the cause of your palpitations. Get help right away if you faint or have chest pain, shortness of breath, severe headache, or dizziness. This information is not intended to replace advice given to you by your health care provider. Make sure you discuss any questions you have with your health care provider. Document Revised: 06/11/2020 Document Reviewed: 06/11/2020 Elsevier Patient Education  2024 [employer].

## 2023-02-15 NOTE — ED Provider Notes (Signed)
 [facility]    CSN: [id number] Arrival date & time: 02/15/23  1636      History   Chief Complaint Chief Complaint  Patient presents with   Cough    Appt   Shortness of Breath   Otalgia    HPI Norma Wall is a 53 y.o. female presenting for 6 to 7-week history of cough and post nasal drainage.  States she had a cold the week after Thanksgiving and cough and postnasal drainage has been persistent.  Today, however she started to experience headaches, sinus pressure, right-sided ear pain and had some greenish drainage when she irrigated her nose. No fever. Mild fatigue.  Reports pain in her throat only when she coughs but now swallowing.  Denies chills, sweats, body aches, chest pain.  Reports intermittent palpitations and shortness of breath x 4 days.  States this has happened before when she has been stressed.  She felt the palpitations last night but has not felt them today.  Reports working from home and being more stressed recently.  Has taken Mucinex.  Patient reports having a telemedicine visit today and states they advised her to be seen in urgent care for chest x-ray.  HPI  Past Medical History:  Diagnosis Date   Abdominal pain, right upper quadrant 2013   Anxiety    Cancer (HCC)    basal cells- [place]   COVID-19 2021   Endometriosis 01/2013   Gallbladder polyp    Lipidemia 2020   Lymphedema 01/2019   Migraine headache    3-4x/yr   Ovarian cyst 2014   right ovary   PONV (postoperative nausea and vomiting)    Vertigo    resolves quickly    Patient Active Problem List   Diagnosis Date Noted   History of postoperative nausea and vomiting 11/25/2022   Lumbar facet joint hypertrophy (Multilevel) (Bilateral) 11/10/2022   Osteoarthritis of facet joint of lumbar spine 11/10/2022   Lumbosacral facet joint syndrome 11/10/2022   Osteoarthritis of lumbar spine 11/10/2022   Spondylosis without myelopathy or radiculopathy, lumbosacral region 11/10/2022    Lumbar lateral recess stenosis (Bilateral: L2-3, L3-4, L4-5) 11/10/2022   Lumbosacral foraminal stenosis (Bilateral: L3-4, L4-5) 11/10/2022   Ligamentum flavum hypertrophy (L4-5) 11/10/2022   Osteoarthritis of hip (Right) 11/10/2022   Enthesopathy of hip region (Right) 11/10/2022   Hamstring tendinitis of thigh (Right) 11/10/2022   Iliopsoas bursitis of hip (Left) 11/10/2022   Cervical disc protrusion at C5-6 (Left) 11/10/2022   Cervical disc herniation/extrusion at C6-7 level  (Right) 11/10/2022   Cervical C8 nerve root impingement (Right) 11/10/2022   Cervical lateral recess stenosis (Left: C5-6) (Right:C5-6) 11/10/2022   DDD (degenerative disc disease), cervical 11/10/2022   DDD (degenerative disc disease), lumbar 11/10/2022   Chronic hip pain (1ry area of Pain) (Right) 10/25/2022   Iliopsoas bursitis of hip (Right) 10/25/2022   Groin pain (Right) 10/25/2022   Chronic low back pain (Bilateral) w/o sciatica 10/25/2022   Chronic low back pain (2ry area of Pain) (Midline) w/o sciatica 10/25/2022   Lumbar facet joint pain 10/25/2022   Grade 1 Retrolisthesis of Lumbar region (L2/L3, L3/L4, L4/L5) 10/25/2022   Abnormal MRI, Hip (Right) (09/04/2022) 10/24/2022   Abnormal MRI, lumbar spine (09/03/2022) 10/24/2022   Chronic pain syndrome 10/24/2022   Pharmacologic therapy 10/24/2022   Disorder of skeletal system 10/24/2022   Problems influencing health status 10/24/2022   Abnormal MRI, cervical spine (01/18/2018) 10/24/2022   Arthralgia of right hip 06/29/2022   Multilevel lumbosacral spondylosis with  radiculopathy 06/29/2022   Low libido 05/13/2022   Hot flashes 05/13/2022   Hematuria 03/01/2022   Left lower quadrant abdominal pain 03/01/2022   Right upper quadrant abdominal pain 03/01/2022   S/P laparoscopic hysterectomy 09/25/2020   Pelvic pain    Polyp of ascending colon    Lymphedema 01/20/2019   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   Lipedema 01/10/2019   Endometriosis determined by  laparoscopy 07/06/2018   Dermatitis, eczematoid 11/20/2014   External hemorrhoid 11/20/2014   Greater trochanteric pain syndrome of lower extremity (Right) 11/20/2014   Migraine 08/28/2014   Gallbladder polyp 09/13/2012   Avitaminosis D 05/19/2009    Past Surgical History:  Procedure Laterality Date   ABLATION  2012   cyst removal   CHOLECYSTECTOMY N/A 10/19/2018   Procedure: LAPAROSCOPIC CHOLECYSTECTOMY;  Surgeon: Jordis Laneta FALCON, MD;  Location: [facility];  Service: General;  Laterality: N/A;   COLONOSCOPY WITH PROPOFOL  N/A 07/21/2020   Procedure: COLONOSCOPY WITH PROPOFOL ;  Surgeon: Jinny Carmine, MD;  Location: [facility];  Service: Endoscopy;  Laterality: N/A;   CYSTOSCOPY N/A 09/25/2020   Procedure: CYSTOSCOPY;  Surgeon: Lake Read, MD;  Location: [facility];  Service: Gynecology;  Laterality: N/A;   CYSTOSCOPY W/ URETERAL STENT PLACEMENT Right 09/25/2020   Procedure: CYSTOSCOPY WITH RETROGRADE PYELOGRAM/URETERAL STENT PLACEMENT;  Surgeon: Francisca Redell BROCKS, MD;  Location: [facility];  Service: Urology;  Laterality: Right;   LAPAROSCOPIC OVARIAN CYSTECTOMY Right 10/19/2018   Procedure: LAPAROSCOPIC RIGHT OVARIAN CYSTECTOMY;  Surgeon: Lake Read, MD;  Location: [facility];  Service: Gynecology;  Laterality: Right;   NECK SURGERY  02/22/2018   Artificial Disc replacement    OVARIAN CYST REMOVAL     POLYPECTOMY N/A 07/21/2020   Procedure: POLYPECTOMY;  Surgeon: Jinny Carmine, MD;  Location: [facility];  Service: Endoscopy;  Laterality: N/A;   TOTAL LAPAROSCOPIC HYSTERECTOMY WITH SALPINGECTOMY Bilateral 09/25/2020   Procedure: TOTAL LAPAROSCOPIC HYSTERECTOMY WITH SALPINGECTOMY;  Surgeon: Lake Read, MD;  Location: [facility];  Service: Gynecology;  Laterality: Bilateral;    OB History     Gravida  2   Para  2   Term      Preterm      AB      Living  2      SAB      IAB      Ectopic      Multiple      Live Births           Obstetric  Comments  1st Menstrual Cycle:  12 1st Pregnancy:  27          Home Medications    Prior to Admission medications   Medication Sig Start Date End Date Taking? Authorizing Provider  acetaminophen  (TYLENOL ) 500 MG tablet Take 1,000 mg by mouth every 6 (six) hours as needed for moderate pain, fever or headache.   Yes [provider]  amoxicillin -clavulanate (AUGMENTIN ) 875-125 MG tablet Take 1 tablet by mouth every 12 (twelve) hours for 7 days. 02/15/23 02/22/23 Yes Arvis Jolan NOVAK, PA-C  brompheniramine-pseudoephedrine -DM 30-2-10 MG/5ML syrup Take 10 mLs by mouth 4 (four) times daily as needed for up to 7 days. 02/15/23 02/22/23 Yes Arvis Jolan NOVAK, PA-C  Cholecalciferol (VITAMIN D3) 50 MCG (2000 UT) CAPS Take by mouth.   Yes [provider]  ibuprofen  (ADVIL ) 200 MG tablet Take 800 mg by mouth as needed.   Yes [provider]  ipratropium (ATROVENT ) 0.06 % nasal spray Place 2 sprays into both nostrils 4 (four)  times daily. 02/15/23  Yes Arvis Jolan NOVAK, PA-C  SUMAtriptan  (IMITREX ) 50 MG tablet Take 1 tablet (50 mg total) by mouth every 2 (two) hours. May repeat in 2 hours if headache persists or recurs. 10/20/22  Yes Bacigalupo, Angela M, MD  Calcium Carbonate (CALCIUM 500 PO) Take by mouth.    [provider]    Family History Family History  Problem Relation Age of Onset   Lung cancer Father    Diabetes Mother    Stroke Mother    Diabetes Maternal Grandmother    Colon cancer Neg Hx    Breast cancer Neg Hx     Social History Social History   Tobacco Use   Smoking status: Former    Current packs/day: 0.00    Types: Cigarettes    Quit date: 02/01/1997    Years since quitting: 26.0   Smokeless tobacco: Never  Vaping Use   Vaping status: Never Used  Substance Use Topics   Alcohol use: Yes    Comment: occassional    Drug use: No     Allergies   Patient has no known allergies.   Review of Systems Review of Systems  Constitutional:   Positive for fatigue. Negative for chills, diaphoresis and fever.  HENT:  Positive for congestion, sinus pressure and sinus pain. Negative for ear pain, rhinorrhea and sore throat.   Respiratory:  Positive for cough and shortness of breath.   Cardiovascular:  Positive for palpitations. Negative for chest pain.  Gastrointestinal:  Negative for abdominal pain, nausea and vomiting.  Musculoskeletal:  Negative for arthralgias and myalgias.  Skin:  Negative for rash.  Neurological:  Positive for headaches. Negative for weakness.  Hematological:  Negative for adenopathy.     Physical Exam Triage Vital Signs ED Triage Vitals  Encounter Vitals Group     BP --      Systolic BP Percentile --      Diastolic BP Percentile --      Pulse --      Resp 02/15/23 1643 16     Temp --      Temp Source 02/15/23 1643 Oral     SpO2 --      Weight 02/15/23 1642 235 lb (106.6 kg)     Height 02/15/23 1642 5' 7 (1.702 m)     Head Circumference --      Peak Flow --      Pain Score 02/15/23 1647 0     Pain Loc --      Pain Education --      Exclude from Growth Chart --    No data found.  Updated Vital Signs BP 139/89 (BP Location: Left Arm)   Pulse 77   Temp 98.2 F (36.8 C) (Oral)   Resp 16   Ht 5' 7 (1.702 m)   Wt 235 lb (106.6 kg)   LMP 09/08/2020   SpO2 96%   BMI 36.81 kg/m   Physical Exam Vitals and nursing note reviewed.  Constitutional:      General: She is not in acute distress.    Appearance: Normal appearance. She is not ill-appearing or toxic-appearing.  HENT:     Head: Normocephalic and atraumatic.     Right Ear: Ear canal and external ear normal. A middle ear effusion is present.     Left Ear: Tympanic membrane, ear canal and external ear normal.     Nose: Congestion present.     Mouth/Throat:     Mouth: Mucous  membranes are moist.     Pharynx: Oropharynx is clear.  Eyes:     General: No scleral icterus.       Right eye: No discharge.        Left eye: No discharge.      Conjunctiva/sclera: Conjunctivae normal.  Cardiovascular:     Rate and Rhythm: Normal rate and regular rhythm.     Heart sounds: Normal heart sounds.  Pulmonary:     Effort: Pulmonary effort is normal. No respiratory distress.     Breath sounds: Normal breath sounds.  Musculoskeletal:     Cervical back: Neck supple.  Skin:    General: Skin is dry.  Neurological:     General: No focal deficit present.     Mental Status: She is alert. Mental status is at baseline.     Motor: No weakness.     Gait: Gait normal.  Psychiatric:        Mood and Affect: Mood normal.        Behavior: Behavior normal.      UC Treatments / Results  Labs (all labs ordered are listed, but only abnormal results are displayed) Labs Reviewed  SARS CORONAVIRUS 2 BY RT PCR    EKG   Radiology DG Chest 2 View Result Date: 02/15/2023 CLINICAL DATA:  6-7 week hx cough and congestion EXAM: CHEST - 2 VIEW COMPARISON:  Chest x-ray 01/13/2017 FINDINGS: The heart and mediastinal contours are within normal limits. No focal consolidation. No pulmonary edema. No pleural effusion. No pneumothorax. No acute osseous abnormality. IMPRESSION: No active cardiopulmonary disease. Electronically Signed   By: Morgane  Naveau M.D.   On: 02/15/2023 17:10    Procedures Procedures (including critical care time)  Medications Ordered in UC Medications - No data to display  Initial Impression / Assessment and Plan / UC Course  I have reviewed the triage vital signs and the nursing notes.  Pertinent labs & imaging results that were available during my care of the patient were reviewed by me and considered in my medical decision making (see chart for details).   53 year old female presents for 6 to 7-week history of cough and postnasal drip after a cold.  Has been having palpitations and intermittent shortness of breath for the past 4 to 5 days.  Thinks it is potentially stress related as this has happened in the past.  Not  currently having palpitations and denies associated chest pain, weakness.  No fever.  Reports pain in her right ear, sinus pressure, headaches and increased fatigue beginning today.  Had a telemedicine appointment today and was advised to be evaluated in urgent care.  Vitals are stable and she is overall well-appearing.  On exam has effusion of right TM, nasal congestion.  Throat clear.  Chest clear.  Will obtain CXR given duration of cough and congestion.  Normal chest x-ray.  Will obtain COVID test in case she has picked up this virus recently since symptoms have recently worsened.  If COVID is negative we will treat for suspected bacterial sinusitis.  COVID-negative.  Reviewed result with patient.  Acute bacterial sinusitis.  Treating at this time with Augmentin , Atrovent  nasal spray and Promethazine  DM.  Reviewed following up with PCP if cough is not improving over the next couple weeks or if it worsens.  Will need to be evaluated for other causes of chronic cough.  Advised of ED precautions for palpitations.   Final Clinical Impressions(s) / UC Diagnoses   Final diagnoses:  Subacute cough  Acute sinusitis, recurrence not specified, unspecified location  Acute nonintractable headache, unspecified headache type  Otalgia, right ear  Palpitations     Discharge Instructions      -Your chest x-ray did not show any evidence of pneumonia. - Your heart is beating a regular rhythm and rate.  Vital signs are all normal and stable.  As discussed, your symptoms of palpitations could be due to PVCs.  PVCs can occur if you are ill, stress, have not had enough sleep, too much caffeine, or every other reasons.  If you have any bouts of sustained palpitations or experience co-occurring severe headaches, dizziness, weakness, pain in chest, shortness of breath, weakness, vomiting, etc. please call 911 or have someone take immediately to the ER.  If you continue to have the palpitations come and go  and have concerns follow-up with your primary care provider. - The chest was negative so I believe your symptoms are due to a bacterial sinusitis. -I [facility] so hopefully you are feeling better and the next week.  I also sent a nasal spray and cough medication.  Increase rest and fluids. - If cough continues follow-up with PCP for evaluation of chronic cough.     ED Prescriptions     Medication Sig Dispense Auth. Provider   ipratropium (ATROVENT ) 0.06 % nasal spray Place 2 sprays into both nostrils 4 (four) times daily. 15 mL Arvis Huxley B, PA-C   brompheniramine-pseudoephedrine -DM 30-2-10 MG/5ML syrup Take 10 mLs by mouth 4 (four) times daily as needed for up to 7 days. 150 mL Arvis Huxley B, PA-C   amoxicillin -clavulanate (AUGMENTIN ) 875-125 MG tablet Take 1 tablet by mouth every 12 (twelve) hours for 7 days. 14 tablet Shya Kovatch B, PA-C      PDMP not reviewed this encounter.   Arvis Huxley NOVAK, PA-C 02/15/23 1812

## 2023-02-15 NOTE — Discharge Instructions (Addendum)
-  Your chest x-ray did not show any evidence of pneumonia. - Your heart is beating a regular rhythm and rate.  Vital signs are all normal and stable.  As discussed, your symptoms of palpitations could be due to PVCs.  PVCs can occur if you are ill, stress, have not had enough sleep, too much caffeine, or every other reasons.  If you have any bouts of sustained palpitations or experience co-occurring severe headaches, dizziness, weakness, pain in chest, shortness of breath, weakness, vomiting, etc. please call 911 or have someone take immediately to the ER.  If you continue to have the palpitations come and go and have concerns follow-up with your primary care provider. - The chest was negative so I believe your symptoms are due to a bacterial sinusitis. -I [facility] antibiotic to [facility] so hopefully you are feeling better and the next week.  I also sent a nasal spray and cough medication.  Increase rest and fluids. - If cough continues follow-up with PCP for evaluation of chronic cough.

## 2023-02-15 NOTE — Progress Notes (Signed)
 Virtual Visit Consent   Norma Wall, you are scheduled for a virtual visit with a [facility] provider today. Just as with appointments in the office, your consent must be obtained to participate. Your consent will be active for this visit and any virtual visit you may have with one of our providers in the next 365 days. If you have a MyChart account, a copy of this consent can be sent to you electronically.  As this is a virtual visit, video technology does not allow for your provider to perform a traditional examination. This may limit your provider's ability to fully assess your condition. If your provider identifies any concerns that need to be evaluated in person or the need to arrange testing (such as labs, EKG, etc.), we will make arrangements to do so. Although advances in technology are sophisticated, we cannot ensure that it will always work on either your end or our end. If the connection with a video visit is poor, the visit may have to be switched to a telephone visit. With either a video or telephone visit, we are not always able to ensure that we have a secure connection.  By engaging in this virtual visit, you consent to the provision of healthcare and authorize for your insurance to be billed (if applicable) for the services provided during this visit. Depending on your insurance coverage, you may receive a charge related to this service.  I need to obtain your verbal consent now. Are you willing to proceed with your visit today? Norma Wall has provided verbal consent on 02/15/2023 for a virtual visit (video or telephone). Norma Lamp, FNP  Date: 02/15/2023 4:04 PM  Virtual Visit via Video Note   I, Norma Wall, connected with  Norma Wall  ([id number], 1970-11-20) on 02/15/23 at  4:00 PM EST by a video-enabled telemedicine application and verified that I am speaking with the correct person using two identifiers.  Location: Patient: Virtual Visit  Location Patient: Home Provider: Virtual Visit Location Provider: Home Office   I discussed the limitations of evaluation and management by telemedicine and the availability of in person appointments. The patient expressed understanding and agreed to proceed.    History of Present Illness: Norma Wall is a 53 y.o. who identifies as a female who was assigned female at birth, and is being seen today for URI sx since thanksgiving. Sx improved except for cough then worsened last week and since the weekend she has palpitations and sob on exertion. Denies chest pain, nausea. No fever. Norma Wall  HPI: HPI  Problems:  Patient Active Problem List   Diagnosis Date Noted   History of postoperative nausea and vomiting 11/25/2022   Lumbar facet joint hypertrophy (Multilevel) (Bilateral) 11/10/2022   Osteoarthritis of facet joint of lumbar spine 11/10/2022   Lumbosacral facet joint syndrome 11/10/2022   Osteoarthritis of lumbar spine 11/10/2022   Spondylosis without myelopathy or radiculopathy, lumbosacral region 11/10/2022   Lumbar lateral recess stenosis (Bilateral: L2-3, L3-4, L4-5) 11/10/2022   Lumbosacral foraminal stenosis (Bilateral: L3-4, L4-5) 11/10/2022   Ligamentum flavum hypertrophy (L4-5) 11/10/2022   Osteoarthritis of hip (Right) 11/10/2022   Enthesopathy of hip region (Right) 11/10/2022   Hamstring tendinitis of thigh (Right) 11/10/2022   Iliopsoas bursitis of hip (Left) 11/10/2022   Cervical disc protrusion at C5-6 (Left) 11/10/2022   Cervical disc herniation/extrusion at C6-7 level  (Right) 11/10/2022   Cervical C8 nerve root impingement (Right) 11/10/2022   Cervical lateral recess stenosis (Left: C5-6) (Right:C5-6)  11/10/2022   DDD (degenerative disc disease), cervical 11/10/2022   DDD (degenerative disc disease), lumbar 11/10/2022   Chronic hip pain (1ry area of Pain) (Right) 10/25/2022   Iliopsoas bursitis of hip (Right) 10/25/2022   Groin pain (Right) 10/25/2022   Chronic  low back pain (Bilateral) w/o sciatica 10/25/2022   Chronic low back pain (2ry area of Pain) (Midline) w/o sciatica 10/25/2022   Lumbar facet joint pain 10/25/2022   Grade 1 Retrolisthesis of Lumbar region (L2/L3, L3/L4, L4/L5) 10/25/2022   Abnormal MRI, Hip (Right) (09/04/2022) 10/24/2022   Abnormal MRI, lumbar spine (09/03/2022) 10/24/2022   Chronic pain syndrome 10/24/2022   Pharmacologic therapy 10/24/2022   Disorder of skeletal system 10/24/2022   Problems influencing health status 10/24/2022   Abnormal MRI, cervical spine (01/18/2018) 10/24/2022   Arthralgia of right hip 06/29/2022   Multilevel lumbosacral spondylosis with radiculopathy 06/29/2022   Low libido 05/13/2022   Hot flashes 05/13/2022   Hematuria 03/01/2022   Left lower quadrant abdominal pain 03/01/2022   Right upper quadrant abdominal pain 03/01/2022   S/P laparoscopic hysterectomy 09/25/2020   Pelvic pain    Polyp of ascending colon    Lymphedema 01/20/2019   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   Lipedema 01/10/2019   Endometriosis determined by laparoscopy 07/06/2018   Dermatitis, eczematoid 11/20/2014   External hemorrhoid 11/20/2014   Greater trochanteric pain syndrome of lower extremity (Right) 11/20/2014   Migraine 08/28/2014   Gallbladder polyp 09/13/2012   Avitaminosis D 05/19/2009    Allergies: No Known Allergies Medications:  Current Outpatient Medications:    acetaminophen  (TYLENOL ) 500 MG tablet, Take 1,000 mg by mouth every 6 (six) hours as needed for moderate pain, fever or headache., Disp: , Rfl:    Calcium Carbonate (CALCIUM 500 PO), Take by mouth., Disp: , Rfl:    Cholecalciferol (VITAMIN D3) 50 MCG (2000 UT) CAPS, Take by mouth., Disp: , Rfl:    ibuprofen  (ADVIL ) 200 MG tablet, Take 800 mg by mouth as needed., Disp: , Rfl:    SUMAtriptan  (IMITREX ) 50 MG tablet, Take 1 tablet (50 mg total) by mouth every 2 (two) hours. May repeat in 2 hours if headache persists or recurs., Disp: 10 tablet, Rfl:  2  Observations/Objective: Patient is well-developed, well-nourished in no acute distress.  Resting comfortably  at home.  Head is normocephalic, atraumatic.  No labored breathing.  Speech is clear and coherent with logical content.  Patient is alert and oriented at baseline.    Assessment and Plan: 1. Upper respiratory tract infection, unspecified type (Primary)  2. Palpitations  3. SOB (shortness of breath) on exertion  Proceed to UC for further eval due to chest pain and sob on exertion.  She agrees with plan.   Follow Up Instructions: I discussed the assessment and treatment plan with the patient. The patient was provided an opportunity to ask questions and all were answered. The patient agreed with the plan and demonstrated an understanding of the instructions.  A copy of instructions were sent to the patient via MyChart unless otherwise noted below.     The patient was advised to call back or seek an in-person evaluation if the symptoms worsen or if the condition fails to improve as anticipated.    Isamar Wellbrock, FNP

## 2023-02-15 NOTE — ED Triage Notes (Signed)
 Pt c/o cough x2 mon,ear pain,HA since today & sob x2 days.  Had Telehealth appt today & was advised to come to UC. Denies any cardiac hx.

## 2023-02-21 ENCOUNTER — Telehealth: Payer: Self-pay

## 2023-02-21 NOTE — Progress Notes (Unsigned)
PROVIDER NOTE: Information contained herein reflects review and annotations entered in association with encounter. Interpretation of such information and data should be left to medically-trained personnel. Information provided to patient can be located elsewhere in the medical record under "Patient Instructions". Document created using STT-dictation technology, any transcriptional errors that may result from process are unintentional.    Patient: Norma Wall  Service Category: E/M  Provider: Oswaldo Done, MD  DOB: [birth year]  DOS: 02/22/2023  Referring Provider: Erasmo Downer, MD  MRN: [id number]  Specialty: Interventional Pain Management  PCP: Erasmo Downer, MD  Type: Established Patient  Setting: Ambulatory outpatient    Location: Office  Delivery: Face-to-face     HPI  Norma Wall, a 53 y.o. year old female, is here today because of her Chronic midline low back pain without sciatica [M54.50, G89.29]. Norma Wall primary complain today is No chief complaint on file.  Pertinent problems: Norma Wall has Migraine; Pelvic pain; Left lower quadrant abdominal pain; Right upper quadrant abdominal pain; Arthralgia of right hip; Greater trochanteric pain syndrome of lower extremity (Right); Multilevel lumbosacral spondylosis with radiculopathy; Abnormal MRI, Hip (Right) (09/04/2022); Abnormal MRI, lumbar spine (09/03/2022); Chronic pain syndrome; Abnormal MRI, cervical spine (01/18/2018); Chronic hip pain (1ry area of Pain) (Right); Iliopsoas bursitis of hip (Right); Groin pain (Right); Chronic low back pain (Bilateral) w/o sciatica; Chronic low back pain (2ry area of Pain) (Midline) w/o sciatica; Lumbar facet joint pain; Grade 1 Retrolisthesis of Lumbar region (L2/L3, L3/L4, L4/L5); Lumbar facet joint hypertrophy (Multilevel) (Bilateral); Osteoarthritis of facet joint of lumbar spine; Lumbosacral facet joint syndrome; Osteoarthritis of lumbar spine; Spondylosis without  myelopathy or radiculopathy, lumbosacral region; Lumbar lateral recess stenosis (Bilateral: L2-3, L3-4, L4-5); Lumbosacral foraminal stenosis (Bilateral: L3-4, L4-5); Ligamentum flavum hypertrophy (L4-5); Osteoarthritis of hip (Right); Enthesopathy of hip region (Right); Hamstring tendinitis of thigh (Right); Iliopsoas bursitis of hip (Left); Cervical disc protrusion at C5-6 (Left); Cervical disc herniation/extrusion at C6-7 level  (Right); Cervical C8 nerve root impingement (Right); Cervical lateral recess stenosis (Left: C5-6) (Right:C5-6); DDD (degenerative disc disease), cervical; and DDD (degenerative disc disease), lumbar on their pertinent problem list. Pain Assessment: Severity of   is reported as a  /10. Location:    / . Onset:  . Quality:  . Timing:  . Modifying factor(s):  [name] Vitals:  vitals were not taken for this visit.  BMI: Estimated body mass index is 36.81 kg/m as calculated from the following:   Height as of 02/15/23: 5\' 7"  (1.702 m).   Weight as of 02/15/23: 235 lb (106.6 kg). Last encounter: 12/14/2022. Last procedure: 02/08/2023.  Reason for encounter: post-procedure evaluation and assessment. ***  Discussed the use of AI scribe software for clinical note transcription with the patient, who gave verbal consent to proceed.  History of Present Illness           Post-procedure evaluation   Type: Lumbar epidural steroid injection (LESI) (interlaminar) #1    Laterality: Right   Level:  L3-4 Level.  Imaging: Fluoroscopic guidance Spinal ([id number]) Anesthesia: Local anesthesia (1-2% Lidocaine) Anxiolysis: IV Versed 2.0 mg Sedation: Moderate Sedation None required. No Fentanyl administered.         DOS: 02/08/2023  Performed by: Oswaldo Done, MD  Purpose: Diagnostic/Therapeutic Indications: Lumbar radicular pain of intraspinal etiology of more than 4 weeks that has failed to respond to conservative therapy and is severe enough to impact quality of life or function. 1.  Chronic low back pain (2ry area of  Pain) (Midline) w/o sciatica   2. Chronic hip pain (1ry area of Pain) (Right)   3. Degeneration of intervertebral disc of lumbar region with discogenic back pain and lower extremity pain   4. Grade 1 Retrolisthesis of Lumbar region (L2/L3, L3/L4, L4/L5)   5. 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 Lumbar lateral recess stenosis (Bilateral: L2-3, L3-4, L4-5)   7. Lumbosacral foraminal stenosis (Bilateral: L3-4, L4-5)   8. Multilevel lumbosacral spondylosis with radiculopathy    NAS-11 Pain score:   Pre-procedure: 5 /10   Post-procedure: 2 /10     Effectiveness:  Initial hour after procedure:   ***. Subsequent 4-6 hours post-procedure:   ***. Analgesia past initial 6 hours:   ***. Ongoing improvement:  Analgesic:  *** Function:    ***    ROM:    ***     Pharmacotherapy Assessment  Analgesic: No chronic opioid analgesics therapy prescribed by our practice. None MME/day: 0 mg/day   Monitoring: [place] PMP: PDMP reviewed during this encounter.       Pharmacotherapy: No side-effects or adverse reactions reported. Compliance: No problems identified. Effectiveness: Clinically acceptable.  No notes on file  No results found for: "CBDTHCR" No results found for: "D8THCCBX" No results found for: "D9THCCBX"  UDS:  Summary  Date Value Ref Range Status  10/26/2022 Note  Final    Comment:    ==================================================================== Compliance Drug Analysis, Ur ==================================================================== Test                             Result       Flag       Units  Drug Present and Declared for Prescription Verification   Gabapentin                     PRESENT      EXPECTED  Drug Absent but Declared for Prescription Verification   Acetaminophen                  Not Detected UNEXPECTED    Acetaminophen, as indicated in the declared medication list, is not    always detected even when used as  directed.    Diclofenac                     Not Detected UNEXPECTED    Diclofenac, as indicated in the declared medication list, is not    always detected even when used as directed.    Ibuprofen                      Not Detected UNEXPECTED    Ibuprofen, as indicated in the declared medication list, is not    always detected even when used as directed.  ==================================================================== Test                      Result    Flag   Units      Ref Range   Creatinine              45               mg/dL      >=30 ==================================================================== Declared Medications:  The flagging and interpretation on this report are based on the  following declared medications.  Unexpected results may arise from  inaccuracies in the declared medications.   **Note: The testing scope of this panel includes these  medications:   Gabapentin (Neurontin)   **Note: The testing scope of this panel does not include small to  moderate amounts of these reported medications:   Acetaminophen (Tylenol)  Diclofenac (Voltaren)  Ibuprofen (Advil)   **Note: The testing scope of this panel does not include the  following reported medications:   Calcium  Sumatriptan (Imitrex)  Vitamin D3 ==================================================================== For clinical consultation, please call [phone]. ====================================================================       ROS  Constitutional: Denies any fever or chills Gastrointestinal: No reported hemesis, hematochezia, vomiting, or acute GI distress Musculoskeletal: Denies any acute onset joint swelling, redness, loss of ROM, or weakness Neurological: No reported episodes of acute onset apraxia, aphasia, dysarthria, agnosia, amnesia, paralysis, loss of coordination, or loss of consciousness  Medication Review  Calcium Carbonate, SUMAtriptan, acetaminophen,  amoxicillin-clavulanate, brompheniramine-pseudoephedrine-DM, ibuprofen, ipratropium, and vitamin D3  History Review  Allergy: Ms. Peyer has no known allergies. Drug: Ms. Palacio  reports no history of drug use. Alcohol:  reports current alcohol use. Tobacco:  reports that she quit smoking about 26 years ago. Her smoking use included cigarettes. She has never used smokeless tobacco. Social: Ms. Donis  reports that she quit smoking about 26 years ago. Her smoking use included cigarettes. She has never used smokeless tobacco. She reports current alcohol use. She reports that she does not use drugs. Medical:  has a past medical history of Abdominal pain, right upper quadrant (2013), Anxiety, Cancer (HCC), COVID-19 (2021), Endometriosis (01/2013), Gallbladder polyp, Lipidemia (2020), Lymphedema (01/2019), Migraine headache, Ovarian cyst (2014), PONV (postoperative nausea and vomiting), and Vertigo. Surgical: Ms. Yauch  has a past surgical history that includes Ablation (2012); Neck surgery (02/22/2018); Ovarian cyst removal; Laparoscopic ovarian cystectomy (Right, 10/19/2018); Cholecystectomy (N/A, 10/19/2018); Colonoscopy with propofol (N/A, 07/21/2020); polypectomy (N/A, 07/21/2020); Total laparoscopic hysterectomy with salpingectomy (Bilateral, 09/25/2020); Cystoscopy (N/A, 09/25/2020); and Cystoscopy w/ ureteral stent placement (Right, 09/25/2020). Family: family history includes Diabetes in her maternal grandmother and mother; Lung cancer in her father; Stroke in her mother.  Laboratory Chemistry Profile   Renal Lab Results  Component Value Date   BUN 17 10/25/2022   CREATININE 0.84 10/25/2022   BCR 13 03/01/2022   GFRAA 89 01/11/2020   GFRNONAA >60 10/25/2022    Hepatic Lab Results  Component Value Date   AST 35 10/25/2022   ALT 36 10/25/2022   ALBUMIN 4.0 10/25/2022   ALKPHOS 41 10/25/2022   AMYLASE 76 03/02/2016   LIPASE 28 03/01/2022    Electrolytes Lab Results  Component Value Date   NA  138 10/25/2022   K 4.3 10/25/2022   CL 104 10/25/2022   CALCIUM 9.3 10/25/2022   MG 2.4 10/25/2022    Bone Lab Results  Component Value Date   VD25OH 26.42 (L) 10/25/2022    Inflammation (CRP: Acute Phase) (ESR: Chronic Phase) Lab Results  Component Value Date   CRP 0.9 10/25/2022   ESRSEDRATE 16 10/25/2022         Note: Above Lab results reviewed.  Recent Imaging Review  DG Chest 2 View CLINICAL DATA:  6-7 week hx cough and congestion  EXAM: CHEST - 2 VIEW  COMPARISON:  Chest x-ray 01/13/2017  FINDINGS: The heart and mediastinal contours are within normal limits.  No focal consolidation. No pulmonary edema. No pleural effusion. No pneumothorax.  No acute osseous abnormality.  IMPRESSION: No active cardiopulmonary disease.  Electronically Signed   By: Tish Frederickson M.D.   On: 02/15/2023 17:10 Note: Reviewed        Physical  Exam  General appearance: Well nourished, well developed, and well hydrated. In no apparent acute distress Mental status: Alert, oriented x 3 (person, place, & time)       Respiratory: No evidence of acute respiratory distress Eyes: PERLA Vitals: LMP 09/08/2020  BMI: Estimated body mass index is 36.81 kg/m as calculated from the following:   Height as of 02/15/23: 5\' 7"  (1.702 m).   Weight as of 02/15/23: 235 lb (106.6 kg). Ideal: Ideal body weight: 61.6 kg (135 lb 12.9 oz) Adjusted ideal body weight: 79.6 kg (175 lb 7.7 oz)  Assessment   Diagnosis Status  1. Chronic low back pain (2ry area of Pain) (Midline) w/o sciatica   2. Chronic hip pain (1ry area of Pain) (Right)   3. Groin pain (Right)   4. Postop check    Controlled Controlled Controlled   Updated Problems: No problems updated.  Plan of Care  Problem-specific:  Assessment and Plan            Ms. Shamaya Schussler has a current medication list which includes the following long-term medication(s): calcium carbonate, ipratropium, and  sumatriptan.  Pharmacotherapy (Medications Ordered): No orders of the defined types were placed in this encounter.  Orders:  No orders of the defined types were placed in this encounter.  Follow-up plan:   No follow-ups on file.      Interventional Therapies  Risk Factors  Considerations  Medical Comorbidities:     Planned  Pending:   Diagnostic (ML) L3-4 LESI #1 (02/08/2023)    Under consideration:   Diagnostic right IA hip joint (anterosuperior acetabulum) + right hamstring tendon insertion injection #1  Diagnostic right L3 and L4 TFESI #1  Diagnostic interlaminar L2-3 LESI #1  Diagnostic bilateral lumbar facet MBB #1    Completed:   Diagnostic right IA hip (anterosuperior) + right hamstring peri-tendinal inj. x1 (11/25/2022) (100/100/80/80)    Therapeutic  Palliative (PRN) options:   None established   Completed by other providers:   Diagnostic/therapeutic right IA steroid hip inj. x1       Recent Visits Date Type Provider Dept  02/08/23 Procedure visit Delano Metz, MD [facility]  12/14/22 Office Visit Delano Metz, MD [facility]  11/25/22 Procedure visit Delano Metz, MD [facility]  Showing recent visits within past 90 days and meeting all other requirements Future Appointments Date Type Provider Dept  02/22/23 Appointment Delano Metz, MD [facility]  Showing future appointments within next 90 days and meeting all other requirements  I discussed the assessment and treatment plan with the patient. The patient was provided an opportunity to ask questions and all were answered. The patient agreed with the plan and demonstrated an understanding of the instructions.  Patient advised to call back or seek an in-person evaluation if the symptoms or condition worsens.  Duration of encounter: *** minutes.  Total time on encounter, as per AMA guidelines included both the face-to-face and  non-face-to-face time personally spent by the physician and/or other qualified health care professional(s) on the day of the encounter (includes time in activities that require the physician or other qualified health care professional and does not include time in activities normally performed by clinical staff). Physician's time may include the following activities when performed: Preparing to see the patient (e.g., pre-charting review of records, searching for previously ordered imaging, lab work, and nerve conduction tests) Review of prior analgesic pharmacotherapies. Reviewing PMP Interpreting ordered tests (e.g., lab work, imaging, nerve conduction tests)  Performing post-procedure evaluations, including interpretation of diagnostic procedures Obtaining and/or reviewing separately obtained history Performing a medically appropriate examination and/or evaluation Counseling and educating the patient/family/caregiver Ordering medications, tests, or procedures Referring and communicating with other health care professionals (when not separately reported) Documenting clinical information in the electronic or other health record Independently interpreting results (not separately reported) and communicating results to the patient/ family/caregiver Care coordination (not separately reported)  Note by: Oswaldo Done, MD Date: 02/22/2023; Time: 8:59 AM

## 2023-02-21 NOTE — Telephone Encounter (Signed)
Left voicemail with patient that if this is something ongoing we can address at her appt tomorrow 02/22/23, otherwise she can call and let me know the urgency.

## 2023-02-21 NOTE — Telephone Encounter (Signed)
She is having some numbness in her leg, like her leg is asleep. She has an appt in the morning.

## 2023-02-22 ENCOUNTER — Ambulatory Visit: Payer: Federal, State, Local not specified - PPO | Attending: Pain Medicine | Admitting: Pain Medicine

## 2023-02-22 ENCOUNTER — Encounter: Payer: Self-pay | Admitting: Pain Medicine

## 2023-02-22 VITALS — BP 124/90 | HR 76 | Temp 97.3°F | Resp 16 | Ht 67.0 in | Wt 235.0 lb

## 2023-02-22 DIAGNOSIS — Z09 Encounter for follow-up examination after completed treatment for conditions other than malignant neoplasm: Secondary | ICD-10-CM | POA: Diagnosis not present

## 2023-02-22 DIAGNOSIS — M545 Low back pain, unspecified: Secondary | ICD-10-CM | POA: Diagnosis not present

## 2023-02-22 DIAGNOSIS — M25551 Pain in right hip: Secondary | ICD-10-CM | POA: Insufficient documentation

## 2023-02-22 DIAGNOSIS — G8929 Other chronic pain: Secondary | ICD-10-CM | POA: Diagnosis not present

## 2023-02-22 DIAGNOSIS — R1031 Right lower quadrant pain: Secondary | ICD-10-CM | POA: Diagnosis not present

## 2023-02-22 NOTE — Progress Notes (Unsigned)
Safety precautions to be maintained throughout the outpatient stay will include: orient to surroundings, keep bed in low position, maintain call bell within reach at all times, provide assistance with transfer out of bed and ambulation.  

## 2023-02-22 NOTE — Patient Instructions (Addendum)
______________________________________________________________________    General Risks and Possible Complications  Patient Responsibilities: It is important that you read this as it is part of your informed consent. It is our duty to inform you of the risks and possible complications associated with treatments offered to you. It is your responsibility as a patient to read this and to ask questions about anything that is not clear or that you believe was not covered in this document.  Patient's Rights: You have the right to refuse treatment. You also have the right to change your mind, even after initially having agreed to have the treatment done. However, under this last option, if you wait until the last second to change your mind, you may be charged for the materials used up to that point.  Introduction: Medicine is not an Visual merchandiser. Everything in Medicine, including the lack of treatment(s), carries the potential for danger, harm, or loss (which is by definition: Risk). In Medicine, a complication is a secondary problem, condition, or disease that can aggravate an already existing one. All treatments carry the risk of possible complications. The fact that a side effects or complications occurs, does not imply that the treatment was conducted incorrectly. It must be clearly understood that these can happen even when everything is done following the highest safety standards.  No treatment: You can choose not to proceed with the proposed treatment alternative. The "PRO(s)" would include: avoiding the risk of complications associated with the therapy. The "CON(s)" would include: not getting any of the treatment benefits. These benefits fall under one of three categories: diagnostic; therapeutic; and/or palliative. Diagnostic benefits include: getting information which can ultimately lead to improvement of the disease or symptom(s). Therapeutic benefits are those associated with the successful  treatment of the disease. Finally, palliative benefits are those related to the decrease of the primary symptoms, without necessarily curing the condition (example: decreasing the pain from a flare-up of a chronic condition, such as incurable terminal cancer).  General Risks and Complications: These are associated to most interventional treatments. They can occur alone, or in combination. They fall under one of the following six (6) categories: no benefit or worsening of symptoms; bleeding; infection; nerve damage; allergic reactions; and/or death. No benefits or worsening of symptoms: In Medicine there are no guarantees, only probabilities. No healthcare provider can ever guarantee that a medical treatment will work, they can only state the probability that it may. Furthermore, there is always the possibility that the condition may worsen, either directly, or indirectly, as a consequence of the treatment. Bleeding: This is more common if the patient is taking a blood thinner, either prescription or over the counter (example: Goody Powders, Fish oil, Aspirin, Garlic, etc.), or if suffering a condition associated with impaired coagulation (example: Hemophilia, cirrhosis of the liver, low platelet counts, etc.). However, even if you do not have one on these, it can still happen. If you have any of these conditions, or take one of these drugs, make sure to notify your treating physician. Infection: This is more common in patients with a compromised immune system, either due to disease (example: diabetes, cancer, human immunodeficiency virus [HIV], etc.), or due to medications or treatments (example: therapies used to treat cancer and rheumatological diseases). However, even if you do not have one on these, it can still happen. If you have any of these conditions, or take one of these drugs, make sure to notify your treating physician. Nerve Damage: This is more common when the treatment is  an invasive one, but it  can also happen with the use of medications, such as those used in the treatment of cancer. The damage can occur to small secondary nerves, or to large primary ones, such as those in the spinal cord and brain. This damage may be temporary or permanent and it may lead to impairments that can range from temporary numbness to permanent paralysis and/or brain death. Allergic Reactions: Any time a substance or material comes in contact with our body, there is the possibility of an allergic reaction. These can range from a mild skin rash (contact dermatitis) to a severe systemic reaction (anaphylactic reaction), which can result in death. Death: In general, any medical intervention can result in death, most of the time due to an unforeseen complication. ______________________________________________________________________     Epidural Steroid Injection Patient Information  Description: The epidural space surrounds the nerves as they exit the spinal cord.  In some patients, the nerves can be compressed and inflamed by a bulging disc or a tight spinal canal (spinal stenosis).  By injecting steroids into the epidural space, we can bring irritated nerves into direct contact with a potentially helpful medication.  These steroids act directly on the irritated nerves and can reduce swelling and inflammation which often leads to decreased pain.  Epidural steroids may be injected anywhere along the spine and from the neck to the low back depending upon the location of your pain.   After numbing the skin with local anesthetic (like Novocaine), a small needle is passed into the epidural space slowly.  You may experience a sensation of pressure while this is being done.  The entire block usually last less than 10 minutes.  Conditions which may be treated by epidural steroids:  Low back and leg pain Neck and arm pain Spinal stenosis Post-laminectomy syndrome Herpes zoster (shingles) pain Pain from compression  fractures  Preparation for the injection:  Do not eat any solid food or dairy products within 8 hours of your appointment.  You may drink clear liquids up to 3 hours before appointment.  Clear liquids include water, black coffee, juice or soda.  No milk or cream please. You may take your regular medication, including pain medications, with a sip of water before your appointment  Diabetics should hold regular insulin (if taken separately) and take 1/2 normal NPH dos the morning of the procedure.  Carry some sugar containing items with you to your appointment. A driver must accompany you and be prepared to drive you home after your procedure.  Bring all your current medications with your. An IV may be inserted and sedation may be given at the discretion of the physician.   A blood pressure cuff, EKG and other monitors will often be applied during the procedure.  Some patients may need to have extra oxygen administered for a short period. You will be asked to provide medical information, including your allergies, prior to the procedure.  We must know immediately if you are taking blood thinners (like Coumadin/Warfarin)  Or if you are allergic to IV iodine contrast (dye). We must know if you could possible be pregnant.  Possible side-effects: Bleeding from needle site Infection (rare, may require surgery) Nerve injury (rare) Numbness & tingling (temporary) Difficulty urinating (rare, temporary) Spinal headache ( a headache worse with upright posture) Light -headedness (temporary) Pain at injection site (several days) Decreased blood pressure (temporary) Weakness in arm/leg (temporary) Pressure sensation in back/neck (temporary)  Call if you experience: Fever/chills associated with headache  or increased back/neck pain. Headache worsened by an upright position. New onset weakness or numbness of an extremity below the injection site Hives or difficulty breathing (go to the emergency  room) Inflammation or drainage at the infection site Severe back/neck pain Any new symptoms which are concerning to you  Please note:  Although the local anesthetic injected can often make your back or neck feel good for several hours after the injection, the pain will likely return.  It takes 3-7 days for steroids to work in the epidural space.  You may not notice any pain relief for at least that one week.  If effective, we will often do a series of three injections spaced 3-6 weeks apart to maximally decrease your pain.  After the initial series, we generally will wait several months before considering a repeat injection of the same type.  If you have any questions, please call [phone]  [facility] ______________________________________________________________________    Procedure instructions  Stop blood-thinners  Do not eat or drink fluids (other than water) for 6 hours before your procedure  No water for 2 hours before your procedure  Take your blood pressure medicine with a sip of water  Arrive 30 minutes before your appointment  If sedation is planned, bring suitable driver. Pennie Banter, Benedetto Goad, & public transportation are NOT APPROVED)  Carefully read the "Preparing for your procedure" detailed instructions  If you have questions call us at [phone]  Procedure appointments are for procedures only. NO medication refills or new problem evaluations.   ______________________________________________________________________      ______________________________________________________________________    Preparing for your procedure  Appointments: If you think you may not be able to keep your appointment, call 24-48 hours in advance to cancel. We need time to make it available to others.  Procedure visits are for procedures only. During your procedure appointment there will be: NO Prescription Refills*. NO medication changes or  discussions*. NO discussion of disability issues*. NO unrelated pain problem evaluations*. NO evaluations to order other pain procedures*. *These will be addressed at a separate and distinct evaluation encounter on the provider's evaluation schedule and not during procedure days.  Instructions: Food intake: Avoid eating anything solid for at least 8 hours prior to your procedure. Clear liquid intake: You may take clear liquids such as water up to 2 hours prior to your procedure. (No carbonated drinks. No soda.) Transportation: Unless otherwise stated by your physician, bring a driver. (Driver cannot be a Market researcher, Pharmacist, community, or any other form of public transportation.) Morning Medicines: Except for blood thinners, take all of your other morning medications with a sip of water. Make sure to take your heart and blood pressure medicines. If your blood pressure's lower number is above 100, the case will be rescheduled. Blood thinners: Make sure to stop your blood thinners as instructed.  If you take a blood thinner, but were not instructed to stop it, call our office [phone] and ask to talk to a nurse. Not stopping a blood thinner prior to certain procedures could lead to serious complications. Diabetics on insulin: Notify the staff so that you can be scheduled 1st case in the morning. If your diabetes requires high dose insulin, take only  of your normal insulin dose the morning of the procedure and notify the staff that you have done so. Preventing infections: Shower with an antibacterial soap the morning of your procedure.  Build-up your immune system: Take 1000 mg of Vitamin C with every  meal (3 times a day) the day prior to your procedure. Antibiotics: Inform the nursing staff if you are taking any antibiotics or if you have any conditions that may require antibiotics prior to procedures. (Example: recent joint implants)   Pregnancy: If you are pregnant make sure to notify the nursing staff. Not  doing so may result in injury to the fetus, including death.  Sickness: If you have a cold, fever, or any active infections, call and cancel or reschedule your procedure. Receiving steroids while having an infection may result in complications. Arrival: You must be in the facility at least 30 minutes prior to your scheduled procedure. Tardiness: Your scheduled time is also the cutoff time. If you do not arrive at least 15 minutes prior to your procedure, you will be rescheduled.  Children: Do not bring any children with you. Make arrangements to keep them home. Dress appropriately: There is always a possibility that your clothing may get soiled. Avoid long dresses. Valuables: Do not bring any jewelry or valuables.  Reasons to call and reschedule or cancel your procedure: (Following these recommendations will minimize the risk of a serious complication.) Surgeries: Avoid having procedures within 2 weeks of any surgery. (Avoid for 2 weeks before or after any surgery). Flu Shots: Avoid having procedures within 2 weeks of a flu shots or . (Avoid for 2 weeks before or after immunizations). Barium: Avoid having a procedure within 7-10 days after having had a radiological study involving the use of radiological contrast. (Myelograms, Barium swallow or enema study). Heart attacks: Avoid any elective procedures or surgeries for the initial 6 months after a "Myocardial Infarction" (Heart Attack). Blood thinners: It is imperative that you stop these medications before procedures. Let us know if you if you take any blood thinner.  Infection: Avoid procedures during or within two weeks of an infection (including chest colds or gastrointestinal problems). Symptoms associated with infections include: Localized redness, fever, chills, night sweats or profuse sweating, burning sensation when voiding, cough, congestion, stuffiness, runny nose, sore throat, diarrhea, nausea, vomiting, cold or Flu symptoms, recent or  current infections. It is specially important if the infection is over the area that we intend to treat. Heart and lung problems: Symptoms that may suggest an active cardiopulmonary problem include: cough, chest pain, breathing difficulties or shortness of breath, dizziness, ankle swelling, uncontrolled high or unusually low blood pressure, and/or palpitations. If you are experiencing any of these symptoms, cancel your procedure and contact your primary care physician for an evaluation.  Remember:  Regular Business hours are:  Monday to Thursday 8:00 AM to 4:00 PM  Provider's Schedule: Delano Metz, MD:  Procedure days: Tuesday and Thursday 7:30 AM to 4:00 PM  Edward Jolly, MD:  Procedure days: Monday and Wednesday 7:30 AM to 4:00 PM Last  Updated: 01/11/2023 ______________________________________________________________________      ______________________________________________________________________    General Risks and Possible Complications  Patient Responsibilities: It is important that you read this as it is part of your informed consent. It is our duty to inform you of the risks and possible complications associated with treatments offered to you. It is your responsibility as a patient to read this and to ask questions about anything that is not clear or that you believe was not covered in this document.  Patient's Rights: You have the right to refuse treatment. You also have the right to change your mind, even after initially having agreed to have the treatment done. However, under this last option, if  you wait until the last second to change your mind, you may be charged for the materials used up to that point.  Introduction: Medicine is not an Visual merchandiser. Everything in Medicine, including the lack of treatment(s), carries the potential for danger, harm, or loss (which is by definition: Risk). In Medicine, a complication is a secondary problem, condition, or disease that  can aggravate an already existing one. All treatments carry the risk of possible complications. The fact that a side effects or complications occurs, does not imply that the treatment was conducted incorrectly. It must be clearly understood that these can happen even when everything is done following the highest safety standards.  No treatment: You can choose not to proceed with the proposed treatment alternative. The "PRO(s)" would include: avoiding the risk of complications associated with the therapy. The "CON(s)" would include: not getting any of the treatment benefits. These benefits fall under one of three categories: diagnostic; therapeutic; and/or palliative. Diagnostic benefits include: getting information which can ultimately lead to improvement of the disease or symptom(s). Therapeutic benefits are those associated with the successful treatment of the disease. Finally, palliative benefits are those related to the decrease of the primary symptoms, without necessarily curing the condition (example: decreasing the pain from a flare-up of a chronic condition, such as incurable terminal cancer).  General Risks and Complications: These are associated to most interventional treatments. They can occur alone, or in combination. They fall under one of the following six (6) categories: no benefit or worsening of symptoms; bleeding; infection; nerve damage; allergic reactions; and/or death. No benefits or worsening of symptoms: In Medicine there are no guarantees, only probabilities. No healthcare provider can ever guarantee that a medical treatment will work, they can only state the probability that it may. Furthermore, there is always the possibility that the condition may worsen, either directly, or indirectly, as a consequence of the treatment. Bleeding: This is more common if the patient is taking a blood thinner, either prescription or over the counter (example: Goody Powders, Fish oil, Aspirin, Garlic,  etc.), or if suffering a condition associated with impaired coagulation (example: Hemophilia, cirrhosis of the liver, low platelet counts, etc.). However, even if you do not have one on these, it can still happen. If you have any of these conditions, or take one of these drugs, make sure to notify your treating physician. Infection: This is more common in patients with a compromised immune system, either due to disease (example: diabetes, cancer, human immunodeficiency virus [HIV], etc.), or due to medications or treatments (example: therapies used to treat cancer and rheumatological diseases). However, even if you do not have one on these, it can still happen. If you have any of these conditions, or take one of these drugs, make sure to notify your treating physician. Nerve Damage: This is more common when the treatment is an invasive one, but it can also happen with the use of medications, such as those used in the treatment of cancer. The damage can occur to small secondary nerves, or to large primary ones, such as those in the spinal cord and brain. This damage may be temporary or permanent and it may lead to impairments that can range from temporary numbness to permanent paralysis and/or brain death. Allergic Reactions: Any time a substance or material comes in contact with our body, there is the possibility of an allergic reaction. These can range from a mild skin rash (contact dermatitis) to a severe systemic reaction (anaphylactic reaction), which can  result in death. Death: In general, any medical intervention can result in death, most of the time due to an unforeseen complication. ______________________________________________________________________

## 2023-02-24 ENCOUNTER — Ambulatory Visit
Admission: RE | Admit: 2023-02-24 | Discharge: 2023-02-24 | Disposition: A | Discharge status: Home / Self Care | Payer: Federal, State, Local not specified - PPO | Source: Ambulatory Visit | Attending: Pain Medicine | Admitting: Pain Medicine

## 2023-02-24 ENCOUNTER — Encounter: Payer: Self-pay | Admitting: Pain Medicine

## 2023-02-24 ENCOUNTER — Ambulatory Visit: Payer: Federal, State, Local not specified - PPO | Attending: Pain Medicine | Admitting: Pain Medicine

## 2023-02-24 VITALS — BP 116/83 | Temp 97.5°F | Resp 12 | Ht 67.0 in | Wt 235.0 lb

## 2023-02-24 DIAGNOSIS — M4727 Other spondylosis with radiculopathy, lumbosacral region: Secondary | ICD-10-CM | POA: Diagnosis not present

## 2023-02-24 DIAGNOSIS — M51362 Other intervertebral disc degeneration, lumbar region with discogenic back pain and lower extremity pain: Secondary | ICD-10-CM

## 2023-02-24 DIAGNOSIS — R937 Abnormal findings on diagnostic imaging of other parts of musculoskeletal system: Secondary | ICD-10-CM

## 2023-02-24 DIAGNOSIS — R202 Paresthesia of skin: Secondary | ICD-10-CM | POA: Diagnosis not present

## 2023-02-24 DIAGNOSIS — M48061 Spinal stenosis, lumbar region without neurogenic claudication: Secondary | ICD-10-CM | POA: Diagnosis not present

## 2023-02-24 DIAGNOSIS — M4807 Spinal stenosis, lumbosacral region: Secondary | ICD-10-CM | POA: Diagnosis not present

## 2023-02-24 DIAGNOSIS — M5416 Radiculopathy, lumbar region: Secondary | ICD-10-CM | POA: Diagnosis not present

## 2023-02-24 DIAGNOSIS — M4310 Spondylolisthesis, site unspecified: Secondary | ICD-10-CM | POA: Diagnosis not present

## 2023-02-24 DIAGNOSIS — R200 Anesthesia of skin: Secondary | ICD-10-CM | POA: Diagnosis not present

## 2023-02-24 MED ORDER — LIDOCAINE HCL 2 % IJ SOLN
20.0000 mL | Freq: Once | INTRAMUSCULAR | Status: AC
  Administered 2023-02-24: 400 mg
  Filled 2023-02-24: qty 20

## 2023-02-24 MED ORDER — SODIUM CHLORIDE 0.9% FLUSH
2.0000 mL | Freq: Once | INTRAVENOUS | Status: AC
  Administered 2023-02-24: 2 mL

## 2023-02-24 MED ORDER — SODIUM CHLORIDE (PF) 0.9 % IJ SOLN
INTRAMUSCULAR | Status: AC
  Filled 2023-02-24: qty 10

## 2023-02-24 MED ORDER — TRIAMCINOLONE ACETONIDE 40 MG/ML IJ SUSP
40.0000 mg | Freq: Once | INTRAMUSCULAR | Status: AC
  Administered 2023-02-24: 40 mg
  Filled 2023-02-24: qty 1

## 2023-02-24 MED ORDER — IOHEXOL 180 MG/ML  SOLN
10.0000 mL | Freq: Once | INTRAMUSCULAR | Status: AC
  Administered 2023-02-24: 10 mL via EPIDURAL
  Filled 2023-02-24: qty 20

## 2023-02-24 MED ORDER — ROPIVACAINE HCL 2 MG/ML IJ SOLN
2.0000 mL | Freq: Once | INTRAMUSCULAR | Status: AC
Start: 2023-02-24 — End: 2023-02-24
  Administered 2023-02-24: 2 mL via EPIDURAL
  Filled 2023-02-24: qty 20

## 2023-02-24 MED ORDER — PENTAFLUOROPROP-TETRAFLUOROETH EX AERO
INHALATION_SPRAY | Freq: Once | CUTANEOUS | Status: AC
Start: 2023-02-24 — End: 2023-02-24
  Administered 2023-02-24: 30 via TOPICAL

## 2023-02-24 NOTE — Progress Notes (Signed)
PROVIDER NOTE: Interpretation of information contained herein should be left to medically-trained personnel. Specific patient instructions are provided elsewhere under "Patient Instructions" section of medical record. This document was created in part using STT-dictation technology, any transcriptional errors that may result from this process are unintentional.  Patient: Norma Wall Type: Established DOB: [birth year] MRN: [id number] PCP: Erasmo Downer, MD  Service: Procedure DOS: 02/24/2023 Setting: Ambulatory Location: Ambulatory outpatient facility Delivery: Face-to-face Provider: Oswaldo Done, MD Specialty: Interventional Pain Management Specialty designation: 09 Location: Outpatient facility Ref. Prov.: Bacigalupo, Marzella Schlein, MD       Interventional Therapy   Type: Lumbar epidural steroid injection (LESI) (interlaminar) #2    Laterality: Left   Level:  L4-5 Level.  Imaging: Fluoroscopic guidance Spinal ([id number]) Anesthesia: Local anesthesia (1-2% Lidocaine) Anxiolysis: None                 Sedation: No Sedation                       DOS: 02/24/2023  Performed by: Oswaldo Done, MD  Purpose: Diagnostic/Therapeutic Indications: Lumbar radicular pain of intraspinal etiology of more than 4 weeks that has failed to respond to conservative therapy and is severe enough to impact quality of life or function. 1. Grade 1 Retrolisthesis of Lumbar region (L2/L3, L3/L4, L4/L5)   2. Lumbosacral foraminal stenosis (Bilateral: L3-4, L4-5)   3. Lumbar lateral recess stenosis (Bilateral: L2-3, L3-4, L4-5)   4. Multilevel lumbosacral spondylosis with radiculopathy   5. Abnormal MRI, lumbar spine (09/03/2022)   6. Degeneration of intervertebral disc of lumbar region with discogenic back pain and lower extremity pain    NAS-11 Pain score:   Pre-procedure: 1 /10   Post-procedure: 0-No pain/10      Position / Prep / Materials:  Position: Prone w/ head of the table  raised (slight reverse trendelenburg) to facilitate breathing.  Prep solution: ChloraPrep (2% chlorhexidine gluconate and 70% isopropyl alcohol) Prep Area: Entire Posterior Lumbar Region from lower scapular tip down to mid buttocks area and from flank to flank. Materials:  Tray: Epidural tray Needle(s):  Type: Epidural needle (Tuohy) Gauge (G):  17 Length: Regular (3.5-in) Qty: 1   H&P (Pre-op Assessment):  Norma Wall is a 53 y.o. (year old), female patient, seen today for interventional treatment. She  has a past surgical history that includes Ablation (2012); Neck surgery (02/22/2018); Ovarian cyst removal; Laparoscopic ovarian cystectomy (Right, 10/19/2018); Cholecystectomy (N/A, 10/19/2018); Colonoscopy with propofol (N/A, 07/21/2020); polypectomy (N/A, 07/21/2020); Total laparoscopic hysterectomy with salpingectomy (Bilateral, 09/25/2020); Cystoscopy (N/A, 09/25/2020); and Cystoscopy w/ ureteral stent placement (Right, 09/25/2020). Norma Wall has a current medication list which includes the following prescription(s): acetaminophen, vitamin d3, ibuprofen, and sumatriptan. Her primarily concern today is the Back Pain (lower)  Initial Vital Signs:  Pulse/HCG Rate:  ECG Heart Rate: 77 Temp: (!) 97.5 F (36.4 C) Resp: 16 BP: 119/77 SpO2: 99 %  BMI: Estimated body mass index is 36.81 kg/m as calculated from the following:   Height as of this encounter: 5\' 7"  (1.702 m).   Weight as of this encounter: 235 lb (106.6 kg).  Risk Assessment: Allergies: Reviewed. She has no known allergies.  Allergy Precautions: None required Coagulopathies: Reviewed. None identified.  Blood-thinner therapy: None at this time Active Infection(s): Reviewed. None identified. Norma Wall is afebrile  Site Confirmation: Norma Wall was asked to confirm the procedure and laterality before marking the site Procedure checklist: Completed Consent: Before the procedure and under  the influence of no sedative(s), amnesic(s), or  anxiolytics, the patient was informed of the treatment options, risks and possible complications. To fulfill our ethical and legal obligations, as recommended by the American Medical Association's Code of Ethics, I have informed the patient of my clinical impression; the nature and purpose of the treatment or procedure; the risks, benefits, and possible complications of the intervention; the alternatives, including doing nothing; the risk(s) and benefit(s) of the alternative treatment(s) or procedure(s); and the risk(s) and benefit(s) of doing nothing. The patient was provided information about the general risks and possible complications associated with the procedure. These may include, but are not limited to: failure to achieve desired goals, infection, bleeding, organ or nerve damage, allergic reactions, paralysis, and death. In addition, the patient was informed of those risks and complications associated to Spine-related procedures, such as failure to decrease pain; infection (i.e.: Meningitis, epidural or intraspinal abscess); bleeding (i.e.: epidural hematoma, subarachnoid hemorrhage, or any other type of intraspinal or peri-dural bleeding); organ or nerve damage (i.e.: Any type of peripheral nerve, nerve root, or spinal cord injury) with subsequent damage to sensory, motor, and/or autonomic systems, resulting in permanent pain, numbness, and/or weakness of one or several areas of the body; allergic reactions; (i.e.: anaphylactic reaction); and/or death. Furthermore, the patient was informed of those risks and complications associated with the medications. These include, but are not limited to: allergic reactions (i.e.: anaphylactic or anaphylactoid reaction(s)); adrenal axis suppression; blood sugar elevation that in diabetics may result in ketoacidosis or comma; water retention that in patients with history of congestive heart failure may result in shortness of breath, pulmonary edema, and decompensation  with resultant heart failure; weight gain; swelling or edema; medication-induced neural toxicity; particulate matter embolism and blood vessel occlusion with resultant organ, and/or nervous system infarction; and/or aseptic necrosis of one or more joints. Finally, the patient was informed that Medicine is not an exact science; therefore, there is also the possibility of unforeseen or unpredictable risks and/or possible complications that may result in a catastrophic outcome. The patient indicated having understood very clearly. We have given the patient no guarantees and we have made no promises. Enough time was given to the patient to ask questions, all of which were answered to the patient's satisfaction. Norma Wall has indicated that she wanted to continue with the procedure. Attestation: I, the ordering provider, attest that I have discussed with the patient the benefits, risks, side-effects, alternatives, likelihood of achieving goals, and potential problems during recovery for the procedure that I have provided informed consent. Date  Time: 02/24/2023 11:10 AM  Pre-Procedure Preparation:  Monitoring: As per clinic protocol. Respiration, ETCO2, SpO2, BP, heart rate and rhythm monitor placed and checked for adequate function Safety Precautions: Patient was assessed for positional comfort and pressure points before starting the procedure. Time-out: I initiated and conducted the "Time-out" before starting the procedure, as per protocol. The patient was asked to participate by confirming the accuracy of the "Time Out" information. Verification of the correct person, site, and procedure were performed and confirmed by me, the nursing staff, and the patient. "Time-out" conducted as per Joint Commission's Universal Protocol (UP.01.01.01). Time: 1126 Start Time: 1126 hrs.  Description/Narrative of Procedure:          Target: Epidural space via interlaminar opening, initially targeting the lower laminar  border of the superior vertebral body. Region: Lumbar Approach: Percutaneous paravertebral  Rationale (medical necessity): procedure needed and proper for the diagnosis and/or treatment of the patient's medical symptoms  and needs. Procedural Technique Safety Precautions: Aspiration looking for blood return was conducted prior to all injections. At no point did we inject any substances, as a needle was being advanced. No attempts were made at seeking any paresthesias. Safe injection practices and needle disposal techniques used. Medications properly checked for expiration dates. SDV (single dose vial) medications used. Description of the Procedure: Protocol guidelines were followed. The procedure needle was introduced through the skin, ipsilateral to the reported pain, and advanced to the target area. Bone was contacted and the needle walked caudad, until the lamina was cleared. The epidural space was identified using "loss-of-resistance technique" with 2-3 ml of PF-NaCl (0.9% NSS), in a 5cc LOR glass syringe.  Vitals:   02/24/23 1109 02/24/23 1125 02/24/23 1130 02/24/23 1133  BP: 119/77 112/81 108/79 116/83  Resp: 16 10 10 12   Temp: (!) 97.5 F (36.4 C)     SpO2: 99% 100% 100% 100%  Weight: 235 lb (106.6 kg)     Height: 5\' 7"  (1.702 m)       Start Time: 1126 hrs. End Time: 1132 hrs.  Imaging Guidance (Spinal):          Type of Imaging Technique: Fluoroscopy Guidance (Spinal) Indication(s): Fluoroscopy guidance for needle placement to enhance accuracy in procedures requiring precise needle localization for targeted delivery of medication in or near specific anatomical locations not easily accessible without such real-time imaging assistance. Exposure Time: Please see nurses notes. Contrast: Before injecting any contrast, we confirmed that the patient did not have an allergy to iodine, shellfish, or radiological contrast. Once satisfactory needle placement was completed at the desired level,  radiological contrast was injected. Contrast injected under live fluoroscopy. No contrast complications. See chart for type and volume of contrast used. Fluoroscopic Guidance: I was personally present during the use of fluoroscopy. "Tunnel Vision Technique" used to obtain the best possible view of the target area. Parallax error corrected before commencing the procedure. "Direction-depth-direction" technique used to introduce the needle under continuous pulsed fluoroscopy. Once target was reached, antero-posterior, oblique, and lateral fluoroscopic projection used confirm needle placement in all planes. Images permanently stored in EMR. Interpretation: I personally interpreted the imaging intraoperatively. Adequate needle placement confirmed in multiple planes. Appropriate spread of contrast into desired area was observed. No evidence of afferent or efferent intravascular uptake. No intrathecal or subarachnoid spread observed. Permanent images saved into the patient's record.  Antibiotic Prophylaxis:   Anti-infectives (From admission, onward)    None      Indication(s): None identified  Post-operative Assessment:  Post-procedure Vital Signs:  Pulse/HCG Rate:  76 Temp: (!) 97.5 F (36.4 C) Resp: 12 BP: 116/83 SpO2: 100 %  EBL: None  Complications: No immediate post-treatment complications observed by team, or reported by patient.  Note: The patient tolerated the entire procedure well. A repeat set of vitals were taken after the procedure and the patient was kept under observation following institutional policy, for this type of procedure. Post-procedural neurological assessment was performed, showing return to baseline, prior to discharge. The patient was provided with post-procedure discharge instructions, including a section on how to identify potential problems. Should any problems arise concerning this procedure, the patient was given instructions to immediately contact us, at any time,  without hesitation. In any case, we plan to contact the patient by telephone for a follow-up status report regarding this interventional procedure.  Comments:  No additional relevant information.  Plan of Care (POC)  Orders:  Orders Placed This Encounter  Procedures  Lumbar Epidural Injection    Scheduling Instructions:     Procedure: Interlaminar LESI L4-5     Laterality: Left     Sedation: No Sedation     Timeframe: Today    Where will this procedure be performed?:   [facility]   [facility] C-ARM 1-60 MIN NO REPORT    Intraoperative interpretation by procedural physician at [facility].    Standing Status:   Standing    Number of Occurrences:   1    Reason for exam::   Assistance in needle guidance and placement for procedures requiring needle placement in or near specific anatomical locations not easily accessible without such assistance.   Informed Consent Details: Physician/Practitioner Attestation; Transcribe to consent form and obtain patient signature    Note: Always confirm laterality of pain with Norma Wall, before procedure. Transcribe to consent form and obtain patient signature.    Physician/Practitioner attestation of informed consent for procedure/surgical case:   I, the physician/practitioner, attest that I have discussed with the patient the benefits, risks, side effects, alternatives, likelihood of achieving goals and potential problems during recovery for the procedure that I have provided informed consent.    Procedure:   Lumbar epidural steroid injection under fluoroscopic guidance    Physician/Practitioner performing the procedure:   Nari Vannatter A. Laban Emperor, MD    Indication/Reason:   Low back and/or lower extremity pain secondary to lumbar radiculitis   Provide equipment / supplies at bedside    Procedural tray: Epidural Tray (Disposable  single use) Skin infiltration needle: Regular 1.5-in, 25-G, (x1) Block needle size: Regular  standard Catheter: No catheter required    Standing Status:   Standing    Number of Occurrences:   1    Specify:   Epidural Tray   Saline lock IV    Have LR [phone] mL available and administer at 125 mL/hr if patient becomes hypotensive.    Standing Status:   Standing    Number of Occurrences:   1   Chronic Opioid Analgesic:  No chronic opioid analgesics therapy prescribed by our practice. None MME/day: 0 mg/day   Medications ordered for procedure: Meds ordered this encounter  Medications   iohexol (OMNIPAQUE) 180 MG/ML injection 10 mL    Must be Myelogram-compatible. If not available, you may substitute with a water-soluble, non-ionic, hypoallergenic, myelogram-compatible radiological contrast medium.   lidocaine (XYLOCAINE) 2 % (with pres) injection 400 mg   pentafluoroprop-tetrafluoroeth (GEBAUERS) aerosol   sodium chloride flush (NS) 0.9 % injection 2 mL   ropivacaine (PF) 2 mg/mL (0.2%) (NAROPIN) injection 2 mL   triamcinolone acetonide (KENALOG-40) injection 40 mg   Medications administered: We administered iohexol, lidocaine, pentafluoroprop-tetrafluoroeth, sodium chloride flush, ropivacaine (PF) 2 mg/mL (0.2%), and triamcinolone acetonide.  See the medical record for exact dosing, route, and time of administration.  Follow-up plan:   Return in about 2 weeks (around 03/10/2023) for (Face2F), (PPE).       Interventional Therapies  Risk Factors  Considerations  Medical Comorbidities:     Planned  Pending:   Diagnostic (ML) L3-4 LESI #1 (02/08/2023)    Under consideration:   Diagnostic right IA hip joint (anterosuperior acetabulum) + right hamstring tendon insertion injection #1  Diagnostic right L3 and L4 TFESI #1  Diagnostic interlaminar L2-3 LESI #1  Diagnostic bilateral lumbar facet MBB #1    Completed:   Diagnostic right IA hip (anterosuperior) + right hamstring peri-tendinal inj. x1 (11/25/2022) (100/100/80/80)    Therapeutic  Palliative (PRN)  options:    None established   Completed by other providers:   Diagnostic/therapeutic right IA steroid hip inj. x1       Recent Visits Date Type Provider Dept  02/22/23 Office Visit Delano Metz, MD [facility]  02/08/23 Procedure visit Delano Metz, MD [facility]  12/14/22 Office Visit Delano Metz, MD [facility]  Showing recent visits within past 90 days and meeting all other requirements Today's Visits Date Type Provider Dept  02/24/23 Procedure visit Delano Metz, MD [facility]  Showing today's visits and meeting all other requirements Future Appointments No visits were found meeting these conditions. Showing future appointments within next 90 days and meeting all other requirements  Disposition: Discharge home  Discharge (Date  Time): 02/24/2023;   hrs.   Primary Care Physician: Erasmo Downer, MD Location: [facility] Note by: Oswaldo Done, MD (TTS technology used. I apologize for any typographical errors that were not detected and corrected.) Date: 02/24/2023; Time: 11:40 AM  Disclaimer:  Medicine is not an Visual merchandiser. The only guarantee in medicine is that nothing is guaranteed. It is important to note that the decision to proceed with this intervention was based on the information collected from the patient. The Data and conclusions were drawn from the patient's questionnaire, the interview, and the physical examination. Because the information was provided in large part by the patient, it cannot be guaranteed that it has not been purposely or unconsciously manipulated. Every effort has been made to obtain as much relevant data as possible for this evaluation. It is important to note that the conclusions that lead to this procedure are derived in large part from the available data. Always take into account that the treatment will also be dependent on availability of resources  and existing treatment guidelines, considered by other Pain Management Practitioners as being common knowledge and practice, at the time of the intervention. For Medico-Legal purposes, it is also important to point out that variation in procedural techniques and pharmacological choices are the acceptable norm. The indications, contraindications, technique, and results of the above procedure should only be interpreted and judged by a Board-Certified Interventional Pain Specialist with extensive familiarity and expertise in the same exact procedure and technique.

## 2023-02-24 NOTE — Patient Instructions (Signed)

## 2023-02-24 NOTE — Progress Notes (Signed)
Safety precautions to be maintained throughout the outpatient stay will include: orient to surroundings, keep bed in low position, maintain call bell within reach at all times, provide assistance with transfer out of bed and ambulation.  

## 2023-02-25 ENCOUNTER — Telehealth: Payer: Self-pay

## 2023-02-25 NOTE — Telephone Encounter (Signed)
Post procedure follow up.  Patient states she is doing good.

## 2023-03-01 DIAGNOSIS — I872 Venous insufficiency (chronic) (peripheral): Secondary | ICD-10-CM | POA: Diagnosis not present

## 2023-03-08 ENCOUNTER — Encounter: Payer: Federal, State, Local not specified - PPO | Admitting: Family Medicine

## 2023-03-08 ENCOUNTER — Ambulatory Visit: Payer: Federal, State, Local not specified - PPO | Admitting: Family Medicine

## 2023-03-08 ENCOUNTER — Encounter: Payer: Self-pay | Admitting: Family Medicine

## 2023-03-08 VITALS — BP 115/75 | HR 75 | Ht 67.0 in | Wt 251.6 lb

## 2023-03-08 DIAGNOSIS — E669 Obesity, unspecified: Secondary | ICD-10-CM | POA: Diagnosis not present

## 2023-03-08 DIAGNOSIS — Z0001 Encounter for general adult medical examination with abnormal findings: Secondary | ICD-10-CM | POA: Diagnosis not present

## 2023-03-08 DIAGNOSIS — Z0000 Encounter for general adult medical examination without abnormal findings: Secondary | ICD-10-CM

## 2023-03-08 DIAGNOSIS — L608 Other nail disorders: Secondary | ICD-10-CM | POA: Diagnosis not present

## 2023-03-08 DIAGNOSIS — E559 Vitamin D deficiency, unspecified: Secondary | ICD-10-CM | POA: Diagnosis not present

## 2023-03-08 DIAGNOSIS — Z780 Asymptomatic menopausal state: Secondary | ICD-10-CM | POA: Diagnosis not present

## 2023-03-08 DIAGNOSIS — R6882 Decreased libido: Secondary | ICD-10-CM

## 2023-03-08 DIAGNOSIS — Z23 Encounter for immunization: Secondary | ICD-10-CM

## 2023-03-08 MED ORDER — ESTRADIOL 0.5 MG PO TABS: 0.5000 mg | ORAL_TABLET | Freq: Every day | ORAL | 1 refills | Status: DC

## 2023-03-08 NOTE — Progress Notes (Signed)
 Complete physical exam   Patient: Norma Wall   DOB: 04/06/1970   53 y.o. Female  MRN: [id number] Visit Date: 03/08/2023  Today's healthcare provider: Jon Eva, MD   Chief Complaint  Patient presents with   Annual Exam    Last completed 03/01/22 Diet - General, well balanced Exercise - None Feeling - well Sleeping - pretty good Concerns - black line on great toe of left foot for over a year. Seen podiatrist 6 months ago and was advised to come back in 6 months but still present. Would also like to follow-up on hormone concerns   Immunizations    Influenza - declined Shingles - declined   Subjective    Norma Wall is a 53 y.o. female who presents today for a complete physical exam.   Discussed the use of AI scribe software for clinical note transcription with the patient, who gave verbal consent to proceed.  History of Present Illness   The patient, with a history of hysterectomy and lipedema, presents with multiple concerns. The primary concern is a black line on her toe, which has been present for approximately six years. The patient denies any known trauma to the area and reports that the nail bed appears to be cracked at the edge.  The patient also reports a complete lack of sex drive and increased moodiness, which she believes may be related to menopause. She expresses interest in hormone replacement therapy to address these issues.  The patient has been experiencing hip pain due to degeneration in her back. She has received multiple steroid injections, which have provided some relief. However, she has recently developed numbness in the back of her left leg and foot, which has not improved with additional injections.  The patient also reports weight gain, which she attributes in part to lipedema. She expresses frustration with her inability to lose weight despite attempts at diet and exercise.  Finally, the patient reports a recent cold that  lasted for approximately two months. During the last week of the illness, she experienced heart palpitations. The symptoms resolved after a course of antibiotics.        Last depression screening scores    03/08/2023   11:14 AM 02/08/2023    9:08 AM 11/25/2022    8:14 AM  PHQ 2/9 Scores  PHQ - 2 Score 2 0 0  PHQ- 9 Score 4     Last fall risk screening    02/22/2023   10:16 AM  Fall Risk   Falls in the past year? 0  Number falls in past yr: 0  Risk for fall due to : No Fall Risks  Follow up Falls evaluation completed        Medications: Outpatient Medications Prior to Visit  Medication Sig   acetaminophen  (TYLENOL ) 500 MG tablet Take 1,000 mg by mouth every 6 (six) hours as needed for moderate pain, fever or headache.   Cholecalciferol (VITAMIN D3) 50 MCG (2000 UT) CAPS Take by mouth.   ibuprofen  (ADVIL ) 200 MG tablet Take 800 mg by mouth as needed.   SUMAtriptan  (IMITREX ) 50 MG tablet Take 1 tablet (50 mg total) by mouth every 2 (two) hours. May repeat in 2 hours if headache persists or recurs.   No facility-administered medications prior to visit.    Review of Systems     Objective    BP 115/75 (BP Location: Left Arm, Patient Position: Sitting, Cuff Size: Large)   Pulse 75   Ht  5' 7 (1.702 m)   Wt 251 lb 9.6 oz (114.1 kg)   LMP 09/08/2020   SpO2 100%   BMI 39.41 kg/m    Physical Exam Vitals reviewed.  Constitutional:      General: She is not in acute distress.    Appearance: Normal appearance. She is well-developed. She is not diaphoretic.  HENT:     Head: Normocephalic and atraumatic.  Eyes:     General: No scleral icterus.    Conjunctiva/sclera: Conjunctivae normal.  Neck:     Thyroid : No thyromegaly.  Cardiovascular:     Rate and Rhythm: Normal rate and regular rhythm.     Heart sounds: Normal heart sounds. No murmur heard. Pulmonary:     Effort: Pulmonary effort is normal. No respiratory distress.     Breath sounds: Normal breath sounds. No  wheezing, rhonchi or rales.  Musculoskeletal:     Cervical back: Neck supple.     Right lower leg: No edema.     Left lower leg: No edema.  Lymphadenopathy:     Cervical: No cervical adenopathy.  Skin:    General: Skin is warm and dry.     Findings: No rash.  Neurological:     Mental Status: She is alert and oriented to person, place, and time. Mental status is at baseline.  Psychiatric:        Mood and Affect: Mood normal.        Behavior: Behavior normal.      No results found for any visits on 03/08/23.  Assessment & Plan    Routine Health Maintenance and Physical Exam  Exercise Activities and Dietary recommendations  Goals   None     Immunization History  Administered Date(s) Administered   Influenza Split 11/15/2016   Influenza,inj,Quad PF,6+ Mos 11/15/2016, 01/05/2019   PFIZER(Purple Top)SARS-COV-2 Vaccination 02/21/2019, 03/12/2019, 12/24/2019   Tdap 04/06/2010, 03/01/2022   Zoster Recombinant(Shingrix ) 03/08/2023    Health Maintenance  Topic Date Due   COVID-19 Vaccine (4 - 2024-25 season) 10/03/2022   INFLUENZA VACCINE  05/02/2023 (Originally 09/02/2022)   Zoster Vaccines- Shingrix  (2 of 2) 05/03/2023   MAMMOGRAM  11/04/2023   Colonoscopy  07/21/2025   DTaP/Tdap/Td (3 - Td or Tdap) 03/01/2032   Hepatitis C Screening  Completed   HIV Screening  Completed   HPV VACCINES  Aged Out    Discussed health benefits of physical activity, and encouraged her to engage in regular exercise appropriate for her age and condition.  Problem List Items Addressed This Visit       Other   Obesity (BMI 30-39.9)   Avitaminosis D   Relevant Orders   VITAMIN D  25 Hydroxy (Vit-D Deficiency, Fractures)   Low libido   Menopause   Other Visit Diagnoses       Encounter for annual physical exam    -  Primary   Relevant Orders   Comprehensive metabolic panel   Lipid panel   VITAMIN D  25 Hydroxy (Vit-D Deficiency, Fractures)     Need for shingles vaccine       Relevant  Orders   Varicella-zoster vaccine IM (Completed)     Toenail deformity           Assessment and Plan    Routine Physical Exam Annual wellness visit to assess overall health and address any concerns. - Perform routine physical examination - Review and update medical history  Hip and Lower Back Pain Receiving steroid injections for hip and lower back pain due to  degeneration. Recent injections alleviated most pain but caused numbness in the left leg and foot. Discussed degeneration and symptom management, including risks of repeated steroid injections. - Continue follow-up with Dr. Neviera for pain management - Monitor glucose levels for potential steroid-induced hyperglycemia  Longitudinal Melanonychia Black vertical line on the big toenail present for almost a year. Differential diagnosis includes melanoma, fungal infection, or nail bed issue. Melanoma is unlikely based on appearance and history. Advised against nail removal due to lack of concern for melanoma. - Monitor the nail - No immediate need for follow-up with podiatrist unless changes occur  Gastroesophageal Reflux Disease (GERD) Recent onset of reflux and burning sensation, possibly positional and related to recent changes in work environment. No connection between reflux and back pain. - Monitor symptoms and consider positional adjustments  Weight Management Struggles with weight loss, partly due to lipedema. Discussed diet, exercise, and potential use of GLP-1 agonists. Patient prefers natural methods. Explained that increased fiber intake can naturally boost GLP-1 levels and that weightlifting is more effective for weight loss than cardio. - Increase fiber intake to naturally boost GLP-1 levels - Incorporate weightlifting exercises - Consider calcium and vitamin D  supplementation  Shingles Prevention Hesitant about vaccines but understands the severity of shingles. Discussed benefits, including prevention of severe  pain, permanent nerve damage, blindness, and deafness. Explained potential side effects of the vaccine. - Administer first dose of shingles vaccine today - Schedule second dose in 2-6 months  General Health Maintenance Routine preventive care. - Order routine labs including kidney and liver function, cholesterol, and vitamin D  levels - Continue annual mammograms - Colonoscopy due in 2027.          Return in about 3 months (around 06/05/2023) for estrogen f/u, shingrix  #2.     Jon Eva, MD  [facility] [phone] (phone) [phone] (fax)  [facility]

## 2023-03-08 NOTE — Assessment & Plan Note (Signed)
 Reports zero sex drive and moodiness, likely related to menopause and lack of estrogen. Discussed hormone replacement therapy (HRT) options, including estrogen patches and pills. Patient prefers estrogen pills. Discussed risks, including a small increased risk of breast cancer, and benefits, such as improved mood, sex drive, and bone health. s/p hysterectomy, so no progesterone needed - Prescribe estrogen 0.5 mg daily - Follow up in 2-3 months to assess effectiveness and adjust dosage if necessary

## 2023-03-08 NOTE — Progress Notes (Signed)
 PROVIDER NOTE: Information contained herein reflects review and annotations entered in association with encounter. Interpretation of such information and data should be left to medically-trained personnel. Information provided to patient can be located elsewhere in the medical record under Patient Instructions. Document created using STT-dictation technology, any transcriptional errors that may result from process are unintentional.    Patient: Norma Wall  Service Category: E/M  Provider: Eric DELENA Como, MD  DOB: 09/19/1970  DOS: 03/09/2023  Referring Provider: Myrla Jon HERO, MD  MRN: [id number]  Specialty: Interventional Pain Management  PCP: Myrla Jon HERO, MD  Type: Established Patient  Setting: Ambulatory outpatient    Location: Office  Delivery: Face-to-face     HPI  Norma Wall, a 53 y.o. year old female, is here today because of her Numbness and tingling of left lower extremity [R20.0, R20.2]. Norma Wall primary complain today is Hip Pain  Pertinent problems: Norma Wall has Migraine; Pelvic pain; Left lower quadrant abdominal pain; Right upper quadrant abdominal pain; Arthralgia of right hip; Greater trochanteric pain syndrome of lower extremity (Right); Multilevel lumbosacral spondylosis with radiculopathy; Abnormal MRI, Hip (Right) (09/04/2022); Abnormal MRI, lumbar spine (09/03/2022); Chronic pain syndrome; Abnormal MRI, cervical spine (01/18/2018); Chronic hip pain (1ry area of Pain) (Right); Iliopsoas bursitis of hip (Right); Groin pain (Right); Chronic low back pain (Bilateral) w/o sciatica; Chronic low back pain (2ry area of Pain) (Midline) w/o sciatica; Lumbar facet joint pain; Grade 1 Retrolisthesis of Lumbar region (L2/L3, L3/L4, L4/L5); Lumbar facet joint hypertrophy (Multilevel) (Bilateral); Osteoarthritis of facet joint of lumbar spine; Lumbosacral facet joint syndrome; Osteoarthritis of lumbar spine; Spondylosis without myelopathy or  radiculopathy, lumbosacral region; Lumbar lateral recess stenosis (Bilateral: L2-3, L3-4, L4-5); Lumbosacral foraminal stenosis (Bilateral: L3-4, L4-5); Ligamentum flavum hypertrophy (L4-5); Osteoarthritis of hip (Right); Enthesopathy of hip region (Right); Hamstring tendinitis of thigh (Right); Iliopsoas bursitis of hip (Left); Cervical disc protrusion at C5-6 (Left); Cervical disc herniation/extrusion at C6-7 level  (Right); Cervical C8 nerve root impingement (Right); Cervical lateral recess stenosis (Left: C5-6) (Right:C5-6); DDD (degenerative disc disease), cervical; DDD (degenerative disc disease), lumbar; Lower extremity numbness and tingling (Left); and Lumbar radiculitis (Left) on their pertinent problem list. Pain Assessment: Severity of Chronic pain is reported as a 0-No pain/10. Location: Back Lower, Right, Left/From left bottocks to behing the the thighs and also from back of lower left leg to left foot. Onset: More than a month ago. Quality: Numbness. Timing: Intermittent. Modifying factor(s): Procedures. Vitals:  height is 5' 7 (1.702 m) and weight is 251 lb (113.9 kg). Her temporal temperature is 97.7 F (36.5 C). Her blood pressure is 122/87 and her pulse is 90. Her respiration is 16 and oxygen saturation is 100%.  BMI: Estimated body mass index is 39.31 kg/m as calculated from the following:   Height as of this encounter: 5' 7 (1.702 m).   Weight as of this encounter: 251 lb (113.9 kg). Last encounter: 02/22/2023. Last procedure: 02/24/2023.  Reason for encounter: post-procedure evaluation and assessment.  Discussed the use of AI scribe software for clinical note transcription with the patient, who gave verbal consent to proceed.  History of Present Illness   Norma Wall is a 53 year old female with degenerative spinal disease who presents with persistent numbness and pain management.  She has experienced significant improvement in her pain symptoms following a second  epidural injection, with approximately 95% improvement noted. However, she experiences occasional flare-ups of pain, particularly when engaging in certain physical activities.  She  describes persistent numbness in her left leg, characterized as a 'waking up numbness,' indicating it is not completely numb. The numbness is present from her lower buttocks down to her knee, and also affects her heel and the side of her foot, but skips the back area. This numbness is constant and does not currently impact her daily activities, but she is concerned about potential worsening.  Her past medical history includes degenerative spinal disease, with retrolisthesis and spondylolisthesis at the L2, L3, L4, and L5 levels. She is focused on conservative management, including weight loss and core strengthening, to minimize stress on her spine.  She is currently not taking any specific medications for her condition but has been provided with information on over-the-counter supplements that may help with inflammation and nerve pain. She is addressing lifestyle factors such as smoking, high cholesterol, and weight.      Post-procedure evaluation   Type: Lumbar epidural steroid injection (LESI) (interlaminar) #2    Laterality: Left   Level:  L4-5 Level.  Imaging: Fluoroscopic guidance Spinal ([id number]) Anesthesia: Local anesthesia (1-2% Lidocaine ) Anxiolysis: None                 Sedation: No Sedation                       DOS: 02/24/2023  Performed by: Eric DELENA Como, MD  Purpose: Diagnostic/Therapeutic Indications: Lumbar radicular pain of intraspinal etiology of more than 4 weeks that has failed to respond to conservative therapy and is severe enough to impact quality of life or function. 1. Grade 1 Retrolisthesis of Lumbar region (L2/L3, L3/L4, L4/L5)   2. Lumbosacral foraminal stenosis (Bilateral: L3-4, L4-5)   3. Lumbar lateral recess stenosis (Bilateral: L2-3, L3-4, L4-5)   4. Multilevel lumbosacral  spondylosis with radiculopathy   5. Abnormal MRI, lumbar spine (09/03/2022)   6. Degeneration of intervertebral disc of lumbar region with discogenic back pain and lower extremity pain    NAS-11 Pain score:   Pre-procedure: 1 /10   Post-procedure: 0-No pain/10     Effectiveness:  Initial hour after procedure: 100 %. Subsequent 4-6 hours post-procedure: 100 %. Analgesia past initial 6 hours: 95 % (did not help numbness). Ongoing improvement:  Analgesic: The patient indicates having attained 100% relief of the pain for the duration of local anesthetic followed by an ongoing 95% improvement which she indicates could be considered to be 100% improvement since the symptoms that she has been experiencing are now intermittent while in the past that used to be constant.  In addition, the area of discomfort has greatly reduced and she indicates the discomfort area to be primarily in the area of the buttocks and occasionally will go down the leg further. Function: Norma Wall reports improvement in function ROM: Norma Wall reports improvement in ROM  Pharmacotherapy Assessment  Analgesic: No chronic opioid analgesics therapy prescribed by our practice. None MME/day: 0 mg/day   Monitoring: [place] PMP: PDMP reviewed during this encounter.       Pharmacotherapy: No side-effects or adverse reactions reported. Compliance: No problems identified. Effectiveness: Clinically acceptable.  Bonner Norris, RN  03/09/2023  3:17 PM  Sign when Signing Visit Safety precautions to be maintained throughout the outpatient stay will include: orient to surroundings, keep bed in low position, maintain call bell within reach at all times, provide assistance with transfer out of bed and ambulation.     No results found for: CBDTHCR No results found for: D8THCCBX No results found for:  D9THCCBX  UDS:  Summary  Date Value Ref Range Status  10/26/2022 Note  Final    Comment:     ==================================================================== Compliance Drug Analysis, Ur ==================================================================== Test                             Result       Flag       Units  Drug Present and Declared for Prescription Verification   Gabapentin                      PRESENT      EXPECTED  Drug Absent but Declared for Prescription Verification   Acetaminophen                   Not Detected UNEXPECTED    Acetaminophen , as indicated in the declared medication list, is not    always detected even when used as directed.    Diclofenac                      Not Detected UNEXPECTED    Diclofenac , as indicated in the declared medication list, is not    always detected even when used as directed.    Ibuprofen                       Not Detected UNEXPECTED    Ibuprofen , as indicated in the declared medication list, is not    always detected even when used as directed.  ==================================================================== Test                      Result    Flag   Units      Ref Range   Creatinine              45               mg/dL      >=79 ==================================================================== Declared Medications:  The flagging and interpretation on this report are based on the  following declared medications.  Unexpected results may arise from  inaccuracies in the declared medications.   **Note: The testing scope of this panel includes these medications:   Gabapentin  (Neurontin )   **Note: The testing scope of this panel does not include small to  moderate amounts of these reported medications:   Acetaminophen  (Tylenol )  Diclofenac  (Voltaren )  Ibuprofen  (Advil )   **Note: The testing scope of this panel does not include the  following reported medications:   Calcium  Sumatriptan  (Imitrex )  Vitamin D3 ==================================================================== For clinical consultation, please  call [phone]. ====================================================================       ROS  Constitutional: Denies any fever or chills Gastrointestinal: No reported hemesis, hematochezia, vomiting, or acute GI distress Musculoskeletal: Denies any acute onset joint swelling, redness, loss of ROM, or weakness Neurological: No reported episodes of acute onset apraxia, aphasia, dysarthria, agnosia, amnesia, paralysis, loss of coordination, or loss of consciousness  Medication Review  SUMAtriptan , acetaminophen , estradiol , ibuprofen , and vitamin D3  History Review  Allergy: Norma Wall has no known allergies. Drug: Norma Wall  reports no history of drug use. Alcohol:  reports current alcohol use. Tobacco:  reports that she quit smoking about 26 years ago. Her smoking use included cigarettes. She has never used smokeless tobacco. Social: Norma Wall  reports that she quit smoking about 26 years ago. Her smoking use included cigarettes. She has never used smokeless tobacco. She  reports current alcohol use. She reports that she does not use drugs. Medical:  has a past medical history of Abdominal pain, right upper quadrant (2013), Anxiety, Cancer (HCC), COVID-19 (2021), Endometriosis (01/2013), Gallbladder polyp, Lipidemia (2020), Lymphedema (01/2019), Migraine headache, Ovarian cyst (2014), PONV (postoperative nausea and vomiting), and Vertigo. Surgical: Norma Wall  has a past surgical history that includes Ablation (2012); Neck surgery (02/22/2018); Ovarian cyst removal; Laparoscopic ovarian cystectomy (Right, 10/19/2018); Cholecystectomy (N/A, 10/19/2018); Colonoscopy with propofol  (N/A, 07/21/2020); polypectomy (N/A, 07/21/2020); Total laparoscopic hysterectomy with salpingectomy (Bilateral, 09/25/2020); Cystoscopy (N/A, 09/25/2020); and Cystoscopy w/ ureteral stent placement (Right, 09/25/2020). Family: family history includes Diabetes in her maternal grandmother and mother; Lung cancer in her father;  Stroke in her mother.  Laboratory Chemistry Profile   Renal Lab Results  Component Value Date   BUN 12 03/08/2023   CREATININE 0.88 03/08/2023   BCR 14 03/08/2023   GFRAA 89 01/11/2020   GFRNONAA >60 10/25/2022    Hepatic Lab Results  Component Value Date   AST 16 03/08/2023   ALT 18 03/08/2023   ALBUMIN 4.1 03/08/2023   ALKPHOS 55 03/08/2023   AMYLASE 76 03/02/2016   LIPASE 28 03/01/2022    Electrolytes Lab Results  Component Value Date   NA 139 03/08/2023   K 4.1 03/08/2023   CL 103 03/08/2023   CALCIUM 9.1 03/08/2023   MG 2.4 10/25/2022    Bone Lab Results  Component Value Date   VD25OH 44.8 03/08/2023    Inflammation (CRP: Acute Phase) (ESR: Chronic Phase) Lab Results  Component Value Date   CRP 0.9 10/25/2022   ESRSEDRATE 16 10/25/2022         Note: Above Lab results reviewed.  Recent Imaging Review  DG PAIN CLINIC C-ARM 1-60 MIN NO REPORT Fluoro was used, but no Radiologist interpretation will be provided.  Please refer to NOTES tab for provider progress note. Note: Reviewed        Physical Exam  General appearance: Well nourished, well developed, and well hydrated. In no apparent acute distress Mental status: Alert, oriented x 3 (person, place, & time)       Respiratory: No evidence of acute respiratory distress Eyes: PERLA Vitals: BP 122/87 (Patient Position: Sitting, Cuff Size: Normal)   Pulse 90   Temp 97.7 F (36.5 C) (Temporal)   Resp 16   Ht 5' 7 (1.702 m)   Wt 251 lb (113.9 kg)   LMP 09/08/2020   SpO2 100%   BMI 39.31 kg/m  BMI: Estimated body mass index is 39.31 kg/m as calculated from the following:   Height as of this encounter: 5' 7 (1.702 m).   Weight as of this encounter: 251 lb (113.9 kg). Ideal: Ideal body weight: 61.6 kg (135 lb 12.9 oz) Adjusted ideal body weight: 82.5 kg (181 lb 14.1 oz)  Assessment   Diagnosis Status  1. Lower extremity numbness and tingling (Left)   2. Lumbar radiculitis (Left)   3. Chronic  hip pain (1ry area of Pain) (Right)   4. Groin pain (Right)   5. Chronic low back pain (2ry area of Pain) (Midline) w/o sciatica   6. Postop check    Controlled Controlled Controlled   Updated Problems: No problems updated.  Plan of Care  Problem-specific:  Assessment and Plan    Lumbar Radiculopathy There is a 95% improvement in pain following the second epidural injection. Intermittent numbness persists in the left leg, from the lower buttocks to the knee and heel, described as 'waking  up numbness', but it does not impact daily activities. Pain relief suggests a spinal origin rather than joint. Early intervention is emphasized to minimize treatment complexity. Monitor symptoms and report any recurrence of pain immediately. Consider conservative treatments initially; if ineffective, explore surgical referral.  Degenerative Disc Disease with Spondylolisthesis and Retrolisthesis Degenerative disc disease with spondylolisthesis and retrolisthesis is present at L2-L3, L3-L4, and L4-L5. Lifestyle modifications are emphasized to minimize further degeneration. There is a potential need for spinal fusion surgery if instability occurs, which may prevent nerve damage but not necessarily alleviate back pain. Core strengthening and physical conditioning are advised to reduce spinal stress. Contributing factors include smoking, high cholesterol, and weight. Encourage weight loss and smoking cessation if applicable. Recommend core strengthening and physical conditioning. Monitor for changes in pain or anatomical stability; consider repeat imaging if symptoms change.  General Health Maintenance Information on over-the-counter supplements for inflammation and nerve pain was provided. A list of over-the-counter supplements and their benefits is available.  Follow-up Contact if symptoms worsen or pain recurs.       Norma Wall has a current medication list which includes the following  long-term medication(s): estradiol  and sumatriptan .  Pharmacotherapy (Medications Ordered): No orders of the defined types were placed in this encounter.  Orders:  Orders Placed This Encounter  Procedures   Nursing Instructions:    Please complete this patient's postprocedure evaluation.    Scheduling Instructions:     Please complete this patient's postprocedure evaluation.   Follow-up plan:   Return if symptoms worsen or fail to improve.      Interventional Therapies  Risk Factors  Considerations  Medical Comorbidities:     Planned  Pending:      Under consideration:      Completed:   Diagnostic/therapeutic left L3-4 LESI x1 (02/24/2023) (100/100/95/(B)LBP+(L)LEP:95-100)  Diagnostic/therapeutic right L3-4 LESI x1 (02/08/2023) (80/80/70/(B)LBP + (R)LEP:70)  Diagnostic right IA hip (anterosuperior) + right hamstring peri-tendinal inj. x1 (11/25/2022) (100/100/80/RHp:80)    Therapeutic  Palliative (PRN) options:   None established   Completed by other providers:   Diagnostic/therapeutic right IA steroid hip inj. x1      Recent Visits Date Type Provider Dept  02/24/23 Procedure visit Tanya Glisson, MD [facility]  02/22/23 Office Visit Tanya Glisson, MD [facility]  02/08/23 Procedure visit Tanya Glisson, MD [facility]  12/14/22 Office Visit Tanya Glisson, MD [facility]  Showing recent visits within past 90 days and meeting all other requirements Today's Visits Date Type Provider Dept  03/09/23 Office Visit Tanya Glisson, MD [facility]  Showing today's visits and meeting all other requirements Future Appointments No visits were found meeting these conditions. Showing future appointments within next 90 days and meeting all other requirements  I discussed the assessment and treatment plan with the patient. The patient was provided an opportunity to ask questions and all were answered.  The patient agreed with the plan and demonstrated an understanding of the instructions.  Patient advised to call back or seek an in-person evaluation if the symptoms or condition worsens.  Duration of encounter: 30 minutes.  Total time on encounter, as per AMA guidelines included both the face-to-face and non-face-to-face time personally spent by the physician and/or other qualified health care professional(s) on the day of the encounter (includes time in activities that require the physician or other qualified health care professional and does not include time in activities normally performed by clinical staff). Physician's time may include the following  activities when performed: Preparing to see the patient (e.g., pre-charting review of records, searching for previously ordered imaging, lab work, and nerve conduction tests) Review of prior analgesic pharmacotherapies. Reviewing PMP Interpreting ordered tests (e.g., lab work, imaging, nerve conduction tests) Performing post-procedure evaluations, including interpretation of diagnostic procedures Obtaining and/or reviewing separately obtained history Performing a medically appropriate examination and/or evaluation Counseling and educating the patient/family/caregiver Ordering medications, tests, or procedures Referring and communicating with other health care professionals (when not separately reported) Documenting clinical information in the electronic or other health record Independently interpreting results (not separately reported) and communicating results to the patient/ family/caregiver Care coordination (not separately reported)  Note by: Eric DELENA Como, MD Date: 03/09/2023; Time: 3:59 PM

## 2023-03-09 ENCOUNTER — Encounter: Payer: Self-pay | Admitting: Pain Medicine

## 2023-03-09 ENCOUNTER — Ambulatory Visit: Payer: Federal, State, Local not specified - PPO | Attending: Pain Medicine | Admitting: Pain Medicine

## 2023-03-09 VITALS — BP 122/87 | HR 90 | Temp 97.7°F | Resp 16 | Ht 67.0 in | Wt 251.0 lb

## 2023-03-09 DIAGNOSIS — R202 Paresthesia of skin: Secondary | ICD-10-CM | POA: Insufficient documentation

## 2023-03-09 DIAGNOSIS — G8929 Other chronic pain: Secondary | ICD-10-CM | POA: Diagnosis not present

## 2023-03-09 DIAGNOSIS — Z09 Encounter for follow-up examination after completed treatment for conditions other than malignant neoplasm: Secondary | ICD-10-CM | POA: Insufficient documentation

## 2023-03-09 DIAGNOSIS — M25551 Pain in right hip: Secondary | ICD-10-CM | POA: Diagnosis not present

## 2023-03-09 DIAGNOSIS — M5416 Radiculopathy, lumbar region: Secondary | ICD-10-CM | POA: Insufficient documentation

## 2023-03-09 DIAGNOSIS — R200 Anesthesia of skin: Secondary | ICD-10-CM | POA: Diagnosis not present

## 2023-03-09 DIAGNOSIS — R1031 Right lower quadrant pain: Secondary | ICD-10-CM | POA: Diagnosis not present

## 2023-03-09 DIAGNOSIS — M545 Low back pain, unspecified: Secondary | ICD-10-CM | POA: Insufficient documentation

## 2023-03-09 LAB — COMPREHENSIVE METABOLIC PANEL
ALT: 18 [IU]/L (ref 0–32)
AST: 16 [IU]/L (ref 0–40)
Albumin: 4.1 g/dL (ref 3.8–4.9)
Alkaline Phosphatase: 55 [IU]/L (ref 44–121)
BUN/Creatinine Ratio: 14 (ref 9–23)
BUN: 12 mg/dL (ref 6–24)
Bilirubin Total: 0.4 mg/dL (ref 0.0–1.2)
CO2: 22 mmol/L (ref 20–29)
Calcium: 9.1 mg/dL (ref 8.7–10.2)
Chloride: 103 mmol/L (ref 96–106)
Creatinine, Ser: 0.88 mg/dL (ref 0.57–1.00)
Globulin, Total: 2.6 g/dL (ref 1.5–4.5)
Glucose: 86 mg/dL (ref 70–99)
Potassium: 4.1 mmol/L (ref 3.5–5.2)
Sodium: 139 mmol/L (ref 134–144)
Total Protein: 6.7 g/dL (ref 6.0–8.5)
eGFR: 79 mL/min/{1.73_m2} (ref >59)

## 2023-03-09 LAB — LIPID PANEL
Chol/HDL Ratio: 2.5 {ratio} (ref 0.0–4.4)
Cholesterol, Total: 180 mg/dL (ref 100–199)
HDL: 72 mg/dL (ref >39)
LDL Chol Calc (NIH): 95 mg/dL (ref 0–99)
Triglycerides: 71 mg/dL (ref 0–149)
VLDL Cholesterol Cal: 13 mg/dL (ref 5–40)

## 2023-03-09 LAB — VITAMIN D 25 HYDROXY (VIT D DEFICIENCY, FRACTURES): Vit D, 25-Hydroxy: 44.8 ng/mL (ref 30.0–100.0)

## 2023-03-09 NOTE — Progress Notes (Signed)
 Safety precautions to be maintained throughout the outpatient stay will include: orient to surroundings, keep bed in low position, maintain call bell within reach at all times, provide assistance with transfer out of bed and ambulation.

## 2023-03-09 NOTE — Patient Instructions (Signed)
 ______________________________________________________________________    OTC Supplements:   The following is a list of over-the-counter (OTC) supplements that have been found to have [employer]) studies suggesting that they may be of some benefits when used in moderation in some chronic pain-related conditions.  NOTE:  Always consult with your primary care provider and/or pharmacist before taking any OTC medications to make sure they will not interact with your current medications. Always use manufacturer's recommended dosage.  Supplement Possible benefit May be of benefit in treatment of   Turmeric/curcumin anti-inflammatory Joint and muscle aches and pain.  Glucosamine/chondroitin (triple strength) may slow loss of articular cartilage Joint pain.  Vitamin D-3* may suppress release of chemicals associated with inflammation. Increases tolerance to pain. Joint and muscle aches and pain.   Moringa(+) anti-inflammatory with mild analgesic effects Joint and muscle aches and pain.  Melatonin(+) Helps reset sleep cycle. Insomnia.  Vitamin B-12* may help keep nerves and blood cells healthy as well as maintaining function of nervous system Nerve pain (Burning pain)  Alpha-Lipoic-Acid (ALA)* antioxidant that may help with nerve health, pain, and blocking the activation of some inflammatory chemicals Diabetic neuropathy and metabolic syndrome  superoxide dismutase (SOD)** Currently being reviewed.   Tiger Balm Currently being reviewed.   hydrolyzed collagen peptides* Currently being reviewed.  Collagen supplementation may increases bone strength, density, and mass; may improve joint stiffness/mobility, and functionality; and may reduce joint pain. Possible chondroprotective effects. May help with protection of joint health.   Methylsulfonylmethane (MSM)* Currently being reviewed.   CBD(+) Currently being reviewed.   Delta-8 THC(+) Currently being reviewed.   *  Generally  Recognized As Safe (GRAS) approved substance.-FDA ([employer]) ** Possibly Safe, but not considered Generally Recognized As Safe (Not GRAS) by the [place]  Food and Drug Administration (FDA) as a food additive. (+) Not considered Generally Recognized As Safe (Not GRAS) by the [place]  Food and Drug Administration (FDA) as a food additive.  ______________________________________________________________________

## 2023-03-10 ENCOUNTER — Encounter: Payer: Self-pay | Admitting: Family Medicine

## 2023-03-31 DIAGNOSIS — I872 Venous insufficiency (chronic) (peripheral): Secondary | ICD-10-CM | POA: Diagnosis not present

## 2023-04-04 ENCOUNTER — Telehealth: Payer: Self-pay | Admitting: Family Medicine

## 2023-04-04 DIAGNOSIS — G43709 Chronic migraine without aura, not intractable, without status migrainosus: Secondary | ICD-10-CM

## 2023-04-04 MED ORDER — SUMATRIPTAN SUCCINATE 50 MG PO TABS: 50.0000 mg | ORAL_TABLET | ORAL | 2 refills | Status: DC

## 2023-04-04 NOTE — Telephone Encounter (Signed)
 [facility] pharmacy faxed refill request for the following medications:   SUMAtriptan (IMITREX) 50 MG tablet     Please advise

## 2023-04-26 DIAGNOSIS — Z01818 Encounter for other preprocedural examination: Secondary | ICD-10-CM | POA: Diagnosis not present

## 2023-04-26 DIAGNOSIS — I872 Venous insufficiency (chronic) (peripheral): Secondary | ICD-10-CM | POA: Diagnosis not present

## 2023-05-03 DIAGNOSIS — I872 Venous insufficiency (chronic) (peripheral): Secondary | ICD-10-CM | POA: Diagnosis not present

## 2023-05-10 DIAGNOSIS — I872 Venous insufficiency (chronic) (peripheral): Secondary | ICD-10-CM | POA: Diagnosis not present

## 2023-05-10 DIAGNOSIS — Z09 Encounter for follow-up examination after completed treatment for conditions other than malignant neoplasm: Secondary | ICD-10-CM | POA: Diagnosis not present

## 2023-06-07 ENCOUNTER — Encounter: Payer: Self-pay | Admitting: Family Medicine

## 2023-06-07 ENCOUNTER — Ambulatory Visit: Payer: Federal, State, Local not specified - PPO | Admitting: Family Medicine

## 2023-06-07 VITALS — BP 127/86 | HR 71 | Ht 67.0 in | Wt 253.3 lb

## 2023-06-07 DIAGNOSIS — I890 Lymphedema, not elsewhere classified: Secondary | ICD-10-CM

## 2023-06-07 DIAGNOSIS — Z780 Asymptomatic menopausal state: Secondary | ICD-10-CM

## 2023-06-07 DIAGNOSIS — Z85828 Personal history of other malignant neoplasm of skin: Secondary | ICD-10-CM | POA: Diagnosis not present

## 2023-06-07 DIAGNOSIS — R002 Palpitations: Secondary | ICD-10-CM | POA: Diagnosis not present

## 2023-06-07 DIAGNOSIS — L578 Other skin changes due to chronic exposure to nonionizing radiation: Secondary | ICD-10-CM | POA: Diagnosis not present

## 2023-06-07 DIAGNOSIS — Z86018 Personal history of other benign neoplasm: Secondary | ICD-10-CM | POA: Diagnosis not present

## 2023-06-07 DIAGNOSIS — Z79890 Hormone replacement therapy (postmenopausal): Secondary | ICD-10-CM | POA: Diagnosis not present

## 2023-06-07 DIAGNOSIS — L218 Other seborrheic dermatitis: Secondary | ICD-10-CM | POA: Diagnosis not present

## 2023-06-07 MED ORDER — ESTRADIOL 1 MG PO TABS: 1.0000 mg | ORAL_TABLET | Freq: Every day | ORAL | 2 refills | Status: DC

## 2023-06-07 NOTE — Assessment & Plan Note (Signed)
 Lipedema and Lymphedema Lipedema and lymphedema have been worsening, with current management strategies less effective. Swelling is generally symmetric, occasionally more pronounced in the left leg. Previous specialist consultation confirmed diagnosis and provided pumps. Current management includes vibration plate, leg pumps, and increased water  intake, but these are less effective now. Discussed the natural progression of lipedema and the importance of individualized management strategies. - Encourage continued use of current management strategies. - Consider new set of pumps if current ones become ineffective.

## 2023-06-07 NOTE — Progress Notes (Signed)
 Established patient visit   Patient: Norma Wall   DOB: [birth year]   53 y.o. Female  MRN: [id number] Visit Date: 06/07/2023  Today's healthcare provider: Aden Agreste, MD   Chief Complaint  Patient presents with   Medical Management of Chronic Issues    3 month follow-up for estrogen. Patient also express concerns with feeling flutters more than usual and edema that has not been able to resolve as easy as previous times as she has hx of lymphedema. Patient also reports SOB has gotten worse over the year. Advised of mitral valve regurgitation but was not concerned at that time   Care Management    Zoster Vaccine would like to wait and not do today   Subjective    HPI HPI     Medical Management of Chronic Issues    Additional comments: 3 month follow-up for estrogen. Patient also express concerns with feeling flutters more than usual and edema that has not been able to resolve as easy as previous times as she has hx of lymphedema. Patient also reports SOB has gotten worse over the year. Advised of mitral valve regurgitation but was not concerned at that time        Care Management    Additional comments: Zoster Vaccine would like to wait and not do today      Last edited by Pasty Bongo, CMA on 06/07/2023  4:14 PM.       Discussed the use of AI scribe software for clinical note transcription with the patient, who gave verbal consent to proceed.  History of Present Illness   Norma Wall is a 53 year old female with lipedema and lymphedema who presents with leg swelling, chest fluttering, and estrogen therapy follow-up.  She experiences ongoing leg swelling from lipedema and lymphedema. Her usual management, including a vibration plate, leg pumps, and increased water  intake, is less effective. Currently, her left leg is more swollen than usual. She is frustrated with conflicting management advice, such as pool therapy versus chemical exposure  concerns.  She has a fluttering sensation in her chest for the past couple of weeks, now occurring daily. This sensation feels different from previous stress-related palpitations. An echocardiogram in 2019 showed trivial regurgitation without significant issues. She denies lightheadedness or syncope.  She is on estrogen therapy with no negative side effects but no significant positive changes. She has not experienced hot flashes. Her mood is more stable, though she still has moments of emotional reactivity. She is considering increasing her estrogen dose from 0.5 mg to 1 mg. 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  Her family history includes a mother and aunt likely with lipedema, and another aunt currently with the condition but not seeking treatment. She is concerned about the hereditary nature and limited treatment options.         Medications: Outpatient Medications Prior to Visit  Medication Sig   acetaminophen  (TYLENOL ) 500 MG tablet Take 1,000 mg by mouth every 6 (six) hours as needed for moderate pain, fever or headache.   Cholecalciferol (VITAMIN D3) 50 MCG (2000 UT) CAPS Take by mouth.   ibuprofen  (ADVIL ) 200 MG tablet Take 800 mg by mouth as needed.   SUMAtriptan  (IMITREX ) 50 MG tablet Take 1 tablet (50 mg total) by mouth every 2 (two) hours. May repeat in 2 hours if headache persists or recurs.   [DISCONTINUED] estradiol  (ESTRACE ) 0.5 MG tablet Take 1 tablet (0.5 mg total)  by mouth daily.   No facility-administered medications prior to visit.    Review of Systems     Objective    BP 127/86 (BP Location: Left Arm, Patient Position: Sitting, Cuff Size: Large)   Pulse 71   Ht 5\' 7"  (1.702 m)   Wt 253 lb 4.8 oz (114.9 kg)   LMP 09/08/2020   SpO2 100%   BMI 39.67 kg/m    Physical Exam Vitals reviewed.  Constitutional:      General: She is not in acute distress.    Appearance: Normal appearance. She is well-developed. She is not diaphoretic.   HENT:     Head: Normocephalic and atraumatic.  Eyes:     General: No scleral icterus.    Conjunctiva/sclera: Conjunctivae normal.  Neck:     Thyroid: No thyromegaly.  Cardiovascular:     Rate and Rhythm: Normal rate and regular rhythm.     Pulses: Normal pulses.     Heart sounds: Normal heart sounds. No murmur heard. Pulmonary:     Effort: Pulmonary effort is normal. No respiratory distress.     Breath sounds: Normal breath sounds. No wheezing, rhonchi or rales.  Musculoskeletal:     Cervical back: Neck supple.     Right lower leg: Edema present.     Left lower leg: Edema present.  Lymphadenopathy:     Cervical: No cervical adenopathy.  Skin:    General: Skin is warm and dry.     Findings: No rash.  Neurological:     Mental Status: She is alert and oriented to person, place, and time. Mental status is at baseline.  Psychiatric:        Mood and Affect: Mood normal.        Behavior: Behavior normal.      EKG: incomplete RBBB, normal rate  No results found for any visits on 06/07/23.  Assessment & Plan     Problem List Items Addressed This Visit       Other   Lymphedema   Lipedema and Lymphedema Lipedema and lymphedema have been worsening, with current management strategies less effective. Swelling is generally symmetric, occasionally more pronounced in the left leg. Previous specialist consultation confirmed diagnosis and provided pumps. Current management includes vibration plate, leg pumps, and increased water  intake, but these are less effective now. Discussed the natural progression of lipedema and the importance of individualized management strategies. - Encourage continued use of current management strategies. - Consider new set of pumps if current ones become ineffective.      Menopause - Primary   Hormone replacement therapy (HRT)   Currently on 0.5 mg oral estrogen with no significant side effects but no significant improvement in symptoms. Discussed  increasing dose to 1 mg for better symptom control. Oral and patch forms have similar absorption and risk profiles. Discussed potential risks of estrogen therapy, including blood clot and breast cancer risks, with recent studies suggesting minimal increase in risk. Vaginal estrogen was discussed for vaginal dryness but not for mood or irritability. - Increase oral estrogen dose to 1 mg. - Reassess in 3 months to evaluate effectiveness and side effects.       Other Visit Diagnoses       Palpitations       Relevant Orders   EKG 12-Lead   LONG TERM MONITOR (3-14 DAYS)   Comprehensive metabolic panel with GFR   CBC   TSH   Ambulatory referral to Cardiology  Palpitations Palpitations have been occurring daily for a couple of weeks, described as a gush or extra burst of blood, differing from previous stress-related episodes. No associated lightheadedness or syncope. A 2019 echocardiogram showed trivial regurgitation without significant issues. Differential includes arrhythmia or progression of valvular disease. Discussed using a Zio patch for extended monitoring, capturing 98% of events within 14 days, to determine if cardiology referral is necessary. - Perform EKG today. - Order Zio patch for 14-day heart monitoring. - Consider cardiology referral based on heart monitor results. Patient prefers to go ahead with referral today - Check thyroid function, blood counts, kidney, and liver function.       Return in about 3 months (around 09/07/2023) for chronic disease f/u.      Total time spent on today's visit was greater than 40 minutes, including both face-to-face time and nonface-to-face time personally spent on review of chart (labs and imaging), discussing labs and goals, discussing further work-up, treatment options, referrals to specialist, reviewing outside records, answering patient's questions, and coordinating care.   Aden Agreste, MD  [facility] [phone] (phone) [phone] (fax)  [facility]

## 2023-06-07 NOTE — Assessment & Plan Note (Signed)
 Currently on 0.5 mg oral estrogen with no significant side effects but no significant improvement in symptoms. Discussed increasing dose to 1 mg for better symptom control. Oral and patch forms have similar absorption and risk profiles. Discussed potential risks of estrogen therapy, including blood clot and breast cancer risks, with recent studies suggesting minimal increase in risk. Vaginal estrogen was discussed for vaginal dryness but not for mood or irritability. - Increase oral estrogen dose to 1 mg. - Reassess in 3 months to evaluate effectiveness and side effects.

## 2023-06-08 ENCOUNTER — Ambulatory Visit: Attending: Family Medicine

## 2023-06-08 DIAGNOSIS — R002 Palpitations: Secondary | ICD-10-CM

## 2023-06-09 DIAGNOSIS — I872 Venous insufficiency (chronic) (peripheral): Secondary | ICD-10-CM | POA: Diagnosis not present

## 2023-06-09 DIAGNOSIS — R002 Palpitations: Secondary | ICD-10-CM | POA: Diagnosis not present

## 2023-06-09 DIAGNOSIS — Z01818 Encounter for other preprocedural examination: Secondary | ICD-10-CM | POA: Diagnosis not present

## 2023-06-10 ENCOUNTER — Encounter: Payer: Self-pay | Admitting: Family Medicine

## 2023-06-10 LAB — COMPREHENSIVE METABOLIC PANEL WITH GFR
ALT: 23 IU/L (ref 0–32)
AST: 22 IU/L (ref 0–40)
Albumin: 4.4 g/dL (ref 3.8–4.9)
Alkaline Phosphatase: 52 IU/L (ref 44–121)
BUN/Creatinine Ratio: 16 (ref 9–23)
BUN: 14 mg/dL (ref 6–24)
Bilirubin Total: 0.4 mg/dL (ref 0.0–1.2)
CO2: 22 mmol/L (ref 20–29)
Calcium: 9.4 mg/dL (ref 8.7–10.2)
Chloride: 103 mmol/L (ref 96–106)
Creatinine, Ser: 0.87 mg/dL (ref 0.57–1.00)
Globulin, Total: 2.2 g/dL (ref 1.5–4.5)
Glucose: 82 mg/dL (ref 70–99)
Potassium: 4 mmol/L (ref 3.5–5.2)
Sodium: 141 mmol/L (ref 134–144)
Total Protein: 6.6 g/dL (ref 6.0–8.5)
eGFR: 80 mL/min/{1.73_m2} (ref >59)

## 2023-06-10 LAB — CBC
Hematocrit: 42.4 % (ref 34.0–46.6)
Hemoglobin: 13.6 g/dL (ref 11.1–15.9)
MCH: 28.3 pg (ref 26.6–33.0)
MCHC: 32.1 g/dL (ref 31.5–35.7)
MCV: 88 fL (ref 79–97)
Platelets: 195 10*3/uL (ref 150–450)
RBC: 4.81 x10E6/uL (ref 3.77–5.28)
RDW: 12.7 % (ref 11.7–15.4)
WBC: 4.6 10*3/uL (ref 3.4–10.8)

## 2023-06-10 LAB — TSH: TSH: 1.75 u[IU]/mL (ref 0.450–4.500)

## 2023-06-14 DIAGNOSIS — I872 Venous insufficiency (chronic) (peripheral): Secondary | ICD-10-CM | POA: Diagnosis not present

## 2023-06-14 DIAGNOSIS — Z01818 Encounter for other preprocedural examination: Secondary | ICD-10-CM | POA: Diagnosis not present

## 2023-07-02 DIAGNOSIS — R002 Palpitations: Secondary | ICD-10-CM

## 2023-07-07 ENCOUNTER — Encounter: Payer: Self-pay | Admitting: Family Medicine

## 2023-07-11 ENCOUNTER — Ambulatory Visit: Payer: Self-pay | Admitting: Family Medicine

## 2023-08-09 ENCOUNTER — Telehealth: Payer: Self-pay | Admitting: *Deleted

## 2023-08-09 NOTE — Telephone Encounter (Signed)
Patient returned staff call. 

## 2023-08-09 NOTE — Telephone Encounter (Signed)
 LMOVM to verify card hx.

## 2023-08-09 NOTE — Telephone Encounter (Signed)
 Spoke with pt hx [facility] Dr. Bosie.

## 2023-08-14 NOTE — Progress Notes (Unsigned)
 Cardiology Office Note  Date:  08/15/2023   ID:  Norma Wall, DOB 1970-05-26, MRN [id number]  PCP:  Norma Jon HERO, MD   Chief Complaint  Patient presents with   New Patient (Initial Visit)    Ref  by Dr. Bacigalupo for palpitations. Former patient of Dr. Bosie in 2019; patient wore a Holter Monitor. Patient c/o palpitations for that past about 2 months.      HPI:  [name] is a 53 year old woman with past medical history of DDD/chronic pain Lymphedema Who presents by referral from Dr. Bacigalupo for palpitations, SOB  Developed palpitation symptoms March 2025 For couple of weeks, described as a gush or extra burst of blood, differing from previous stress-related episodes. No associated lightheadedness or syncope.    2019 echocardiogram  NORMAL LEFT VENTRICULAR SYSTOLIC FUNCTION  NORMAL RIGHT VENTRICULAR SYSTOLIC FUNCTION  NO VALVULAR disease Closest EF: >55% (Estimated)   Zio monitor, data pulled up and reviewed HR 55 to 176, average 82. 6 nonsustained SVT, longest 14 beats Rare supraventricular and ventricular ectopy. No sustained arrhythmias. No atrial fibrillation. Isolated SVEs were rare ( < 1. 0% ) ,  SVE Couplets were rare ( < 1. 0% ) ,  SVE Triplets were rare ( < 1. 0% ) .  Isolated VEs were rare ( < 1. 0% ) ,  VE Couplets were rare ( < 1. 0% ) ,  no VE Triplets were present. Symptom triggered episodes correspond to sinus rhythm with PVCs  Symptoms have since resolved, does not feel that she needs medications for palpitations  Has changed diet, weight is trending downward  EKG personally reviewed by myself on todays visit EKG Interpretation Date/Time:  Monday August 15 2023 15:00:30 EDT Ventricular Rate:  71 PR Interval:  156 QRS Duration:  98 QT Interval:  400 QTC Calculation: 434 R Axis:   -3  Text Interpretation: Sinus rhythm with Premature atrial complexes with Abberant conduction Incomplete right bundle branch block When  compared with ECG of 26-Feb-2018 13:14, PREVIOUS ECG IS PRESENT Confirmed by Perla Lye [phone]) on 08/15/2023 3:16:51 PM    PMH:   has a past medical history of Abdominal pain, right upper quadrant (2013), Anxiety, Cancer (HCC), COVID-19 (2021), Endometriosis (01/2013), Gallbladder polyp, Lipidemia (2020), Lymphedema (01/2019), Migraine headache, Ovarian cyst (2014), PONV (postoperative nausea and vomiting), and Vertigo.  PSH:    Past Surgical History:  Procedure Laterality Date   ABLATION  2012   cyst removal   CHOLECYSTECTOMY N/A 10/19/2018   Procedure: LAPAROSCOPIC CHOLECYSTECTOMY;  Surgeon: Jordis Laneta FALCON, MD;  Location: [facility];  Service: General;  Laterality: N/A;   COLONOSCOPY WITH PROPOFOL  N/A 07/21/2020   Procedure: COLONOSCOPY WITH PROPOFOL ;  Surgeon: Jinny Carmine, MD;  Location: [facility];  Service: Endoscopy;  Laterality: N/A;   CYSTOSCOPY N/A 09/25/2020   Procedure: CYSTOSCOPY;  Surgeon: Lake Read, MD;  Location: [facility];  Service: Gynecology;  Laterality: N/A;   CYSTOSCOPY W/ URETERAL STENT PLACEMENT Right 09/25/2020   Procedure: CYSTOSCOPY WITH RETROGRADE PYELOGRAM/URETERAL STENT PLACEMENT;  Surgeon: Francisca Redell BROCKS, MD;  Location: [facility];  Service: Urology;  Laterality: Right;   LAPAROSCOPIC OVARIAN CYSTECTOMY Right 10/19/2018   Procedure: LAPAROSCOPIC RIGHT OVARIAN CYSTECTOMY;  Surgeon: Lake Read, MD;  Location: [facility];  Service: Gynecology;  Laterality: Right;   NECK SURGERY  02/22/2018   Artificial Disc replacement    OVARIAN CYST REMOVAL     POLYPECTOMY N/A 07/21/2020   Procedure: POLYPECTOMY;  Surgeon: Jinny Carmine, MD;  Location: [facility];  Service: Endoscopy;  Laterality: N/A;   TOTAL LAPAROSCOPIC HYSTERECTOMY WITH SALPINGECTOMY Bilateral 09/25/2020   Procedure: TOTAL LAPAROSCOPIC HYSTERECTOMY WITH SALPINGECTOMY;  Surgeon: Lake Read, MD;  Location: [facility];  Service: Gynecology;  Laterality: Bilateral;    Current  Outpatient Medications  Medication Sig Dispense Refill   acetaminophen  (TYLENOL ) 500 MG tablet Take 1,000 mg by mouth every 6 (six) hours as needed for moderate pain, fever or headache.     Cholecalciferol (VITAMIN D3) 50 MCG (2000 UT) CAPS Take by mouth.     estradiol  (ESTRACE ) 1 MG tablet Take 1 tablet (1 mg total) by mouth daily. 30 tablet 2   ibuprofen  (ADVIL ) 200 MG tablet Take 800 mg by mouth as needed.     SUMAtriptan  (IMITREX ) 50 MG tablet Take 1 tablet (50 mg total) by mouth every 2 (two) hours. May repeat in 2 hours if headache persists or recurs. 10 tablet 2   No current facility-administered medications for this visit.    Allergies:   Patient has no known allergies.   Social History:  The patient  reports that she quit smoking about 26 years ago. Her smoking use included cigarettes. She has never used smokeless tobacco. She reports current alcohol use. She reports that she does not use drugs.   Family History:   family history includes Diabetes in her maternal grandmother and mother; Lung cancer in her father; Stroke in her mother.    Review of Systems: Review of Systems  Constitutional: Negative.   HENT: Negative.    Respiratory: Negative.    Cardiovascular:  Positive for palpitations.  Gastrointestinal: Negative.   Musculoskeletal: Negative.   Neurological: Negative.   Psychiatric/Behavioral: Negative.    All other systems reviewed and are negative.   PHYSICAL EXAM: VS:  Ht 5' 7 (1.702 m)   Wt 243 lb 6 oz (110.4 kg)   LMP 09/08/2020   BMI 38.12 kg/m  , BMI Body mass index is 38.12 kg/m. GEN: Well nourished, well developed, in no acute distress HEENT: normal Neck: no JVD, carotid bruits, or masses Cardiac: RRR; no murmurs, rubs, or gallops, nonpitting lower extremity edema Respiratory:  clear to auscultation bilaterally, normal work of breathing GI: soft, nontender, nondistended, + BS MS: no deformity or atrophy Skin: warm and dry, no rash Neuro:  Strength  and sensation are intact Psych: euthymic mood, full affect   Recent Labs: 10/25/2022: Magnesium 2.4 06/09/2023: ALT 23; BUN 14; Creatinine, Ser 0.87; Hemoglobin 13.6; Platelets 195; Potassium 4.0; Sodium 141; TSH 1.750   Lipid Panel Lab Results  Component Value Date   CHOL 180 03/08/2023   HDL 72 03/08/2023   LDLCALC 95 03/08/2023   TRIG 71 03/08/2023    Wt Readings from Last 3 Encounters:  08/15/23 243 lb 6 oz (110.4 kg)  06/07/23 253 lb 4.8 oz (114.9 kg)  03/09/23 251 lb (113.9 kg)     ASSESSMENT AND PLAN:  Problem List Items Addressed This Visit     Chronic pain syndrome (Chronic)   Lymphedema   Other Visit Diagnoses       Palpitations    -  Primary   Relevant Orders   EKG 12-Lead     PVC's (premature ventricular contractions)          Lymphedema Has lymphedema compression pumps Recommend she discuss treatment with PT/OT therapist Name provided of therapist to work through [facility]  Palpitations/PVCs Triggered events pulled up and reviewed showing normal sinus rhythm, rare PVC Also has rare  PACs Symptoms have essentially resolved, does not feel she requires daily beta-blockers or even beta-blockers on an as-needed basis -Recommended if symptoms get worse she call us , low-dose beta-blockers could be tried such as metoprolol tartrate 12.5 twice daily as needed or propranolol 10 mg as needed  Obesity Weight trending down through dietary changes  Preventive care CT scan abdomen pelvis reviewed from 2024 No aortic atherosclerosis noted extending from base of the heart to iliac vessels Cholesterol reasonable, non-smoker, nondiabetic No additional workup needed at this time  Ms. Marcus was seen in consultation for Dr.  Myrla and will be referred back to her office for ongoing care of the issues detailed above  Signed, Velinda Lunger, M.D., Ph.D. [facility] [place], [place] [phone]

## 2023-08-15 ENCOUNTER — Ambulatory Visit: Attending: Cardiovascular Disease | Admitting: Cardiovascular Disease

## 2023-08-15 ENCOUNTER — Encounter: Payer: Self-pay | Admitting: Cardiovascular Disease

## 2023-08-15 VITALS — BP 110/80 | HR 71 | Ht 67.0 in | Wt 243.4 lb

## 2023-08-15 DIAGNOSIS — R002 Palpitations: Secondary | ICD-10-CM

## 2023-08-15 DIAGNOSIS — I890 Lymphedema, not elsewhere classified: Secondary | ICD-10-CM | POA: Diagnosis not present

## 2023-08-15 DIAGNOSIS — G894 Chronic pain syndrome: Secondary | ICD-10-CM

## 2023-08-15 DIAGNOSIS — I493 Ventricular premature depolarization: Secondary | ICD-10-CM

## 2023-08-15 NOTE — Patient Instructions (Addendum)

## 2023-09-08 ENCOUNTER — Encounter: Payer: Self-pay | Admitting: Family Medicine

## 2023-09-08 ENCOUNTER — Ambulatory Visit: Admitting: Family Medicine

## 2023-09-08 VITALS — BP 114/73 | HR 80 | Resp 16 | Wt 241.7 lb

## 2023-09-08 DIAGNOSIS — E669 Obesity, unspecified: Secondary | ICD-10-CM

## 2023-09-08 DIAGNOSIS — R609 Edema, unspecified: Secondary | ICD-10-CM | POA: Diagnosis not present

## 2023-09-08 DIAGNOSIS — I890 Lymphedema, not elsewhere classified: Secondary | ICD-10-CM | POA: Diagnosis not present

## 2023-09-08 DIAGNOSIS — Z780 Asymptomatic menopausal state: Secondary | ICD-10-CM

## 2023-09-08 DIAGNOSIS — Z79890 Hormone replacement therapy (postmenopausal): Secondary | ICD-10-CM | POA: Diagnosis not present

## 2023-09-08 DIAGNOSIS — Z1231 Encounter for screening mammogram for malignant neoplasm of breast: Secondary | ICD-10-CM

## 2023-09-08 NOTE — Progress Notes (Signed)
 Established patient visit   Patient: Norma Wall   DOB: 03-Aug-1970   53 y.o. Female  MRN: [id number] Visit Date: 09/08/2023  Today's healthcare provider: Jon Eva, MD   Chief Complaint  Patient presents with   Follow-up    3 month f/u pt has agreed to get 2nd dose of her shingles Vaccine   Subjective    HPI HPI     Follow-up    Additional comments: 3 month f/u pt has agreed to get 2nd dose of her shingles Vaccine      Last edited by Marylen Odella CROME, CMA on 09/08/2023  9:42 AM.       Discussed the use of AI scribe software for clinical note transcription with the patient, who gave verbal consent to proceed.  History of Present Illness   Norma Wall is a 53 year old female who presents for a follow-up visit regarding hormone therapy and weight management. She is accompanied by her partner, Daryl.  Her mood has improved with 1 mg of estrogen, experiencing occasional irritations but generally feeling more settled. She has not had any hot flashes, which she attributes to the estrogen therapy.  She has lost 12 pounds over the past three months through dietary changes but has reached a plateau. She plans to reinforce her dietary habits to continue progress. She engages in lymphatic movement exercises, contributing to her weight loss, though some may be water  weight due to lymphedema.  She is considering the duration of her hormone therapy and is curious about the differences between oral and patch forms of estrogen, as well as potential impacts on her liver or kidneys.         Medications: Outpatient Medications Prior to Visit  Medication Sig   acetaminophen  (TYLENOL ) 500 MG tablet Take 1,000 mg by mouth every 6 (six) hours as needed for moderate pain, fever or headache.   Cholecalciferol (VITAMIN D3) 50 MCG (2000 UT) CAPS Take by mouth.   estradiol  (ESTRACE ) 1 MG tablet Take 1 tablet (1 mg total) by mouth daily.   ibuprofen  (ADVIL ) 200 MG  tablet Take 800 mg by mouth as needed.   SUMAtriptan  (IMITREX ) 50 MG tablet Take 1 tablet (50 mg total) by mouth every 2 (two) hours. May repeat in 2 hours if headache persists or recurs.   No facility-administered medications prior to visit.    Review of Systems     Objective    BP 114/73 (BP Location: Right Arm, Patient Position: Sitting, Cuff Size: Normal)   Pulse 80   Resp 16   Wt 241 lb 11.2 oz (109.6 kg)   LMP 09/08/2020   SpO2 99%   BMI 37.86 kg/m    Physical Exam Vitals reviewed.  Constitutional:      General: She is not in acute distress.    Appearance: Normal appearance. She is well-developed. She is not diaphoretic.  HENT:     Head: Normocephalic and atraumatic.  Eyes:     General: No scleral icterus.    Conjunctiva/sclera: Conjunctivae normal.  Neck:     Thyroid : No thyromegaly.  Cardiovascular:     Rate and Rhythm: Normal rate and regular rhythm.     Heart sounds: Normal heart sounds. No murmur heard. Pulmonary:     Effort: Pulmonary effort is normal. No respiratory distress.     Breath sounds: Normal breath sounds. No wheezing, rhonchi or rales.  Musculoskeletal:     Cervical back: Neck supple.  Right lower leg: No edema.     Left lower leg: No edema.  Lymphadenopathy:     Cervical: No cervical adenopathy.  Skin:    General: Skin is warm and dry.     Findings: No rash.  Neurological:     Mental Status: She is alert and oriented to person, place, and time. Mental status is at baseline.  Psychiatric:        Mood and Affect: Mood normal.        Behavior: Behavior normal.      No results found for any visits on 09/08/23.  Assessment & Plan     Problem List Items Addressed This Visit       Musculoskeletal and Integument   Lipedema     Other   Obesity (BMI 30-39.9)   Lymphedema   Menopause   Hormone replacement therapy (HRT) - Primary   Other Visit Diagnoses       Breast cancer screening by mammogram       Relevant Orders   MM 3D  SCREENING MAMMOGRAM BILATERAL BREAST           Menopausal hormone therapy for menopausal symptoms On 1 mg of estrogen, resulting in improved mood and reduced hot flashes. Reports no hot flashes and overall mood enhancement. Partner notes improved sexual motivation and drive. Satisfied with current treatment and aware of the need for regular mammograms. Discussed typical duration of therapy, suggesting reassessment around age 12. Informed that oral and patch forms are equivalent in efficacy and impact on liver and kidneys. - Order mammogram for October - Continue 1 mg oral estrogen therapy - Reassess hormone therapy around age 62  Lymphedema Engaging in lymphatic movement exercises, contributing to weight loss and overall well-being. Acknowledges difficulty in assessing estrogen's impact on lymphedema but notes condition is well-managed.  Overweight Lost 12 pounds over the past three months through dietary changes and increased lymphatic movement. Recently plateaued and plans to reinforce dietary changes to continue weight loss.  Mood disturbance Mood improved with 1 mg estrogen, with fewer irritations and mood swings. Partner also notes improvement in mood and motivation. - Continue 1 mg oral estrogen therapy  Female sexual dysfunction Partner notes improvement in sexual motivation and drive since starting estrogen therapy. Satisfied with current level of sexual motivation without immediate concerns. - Continue 1 mg oral estrogen therapy       Return in about 6 months (around 03/10/2024) for CPE.       Jon Eva, MD  [facility] [phone] (phone) [phone] (fax)  [facility]

## 2023-09-09 ENCOUNTER — Other Ambulatory Visit: Payer: Self-pay | Admitting: Family Medicine

## 2023-09-15 ENCOUNTER — Encounter: Payer: Self-pay | Admitting: Family Medicine

## 2023-09-15 ENCOUNTER — Ambulatory Visit (INDEPENDENT_AMBULATORY_CARE_PROVIDER_SITE_OTHER): Admitting: Family Medicine

## 2023-09-15 ENCOUNTER — Telehealth: Payer: Self-pay | Admitting: Family Medicine

## 2023-09-15 DIAGNOSIS — G43709 Chronic migraine without aura, not intractable, without status migrainosus: Secondary | ICD-10-CM

## 2023-09-15 DIAGNOSIS — Z23 Encounter for immunization: Secondary | ICD-10-CM | POA: Diagnosis not present

## 2023-09-15 MED ORDER — SUMATRIPTAN SUCCINATE 50 MG PO TABS: 50.0000 mg | ORAL_TABLET | ORAL | 2 refills | Status: AC

## 2023-09-15 NOTE — Telephone Encounter (Signed)
 Noted

## 2023-09-15 NOTE — Progress Notes (Signed)
 Patient here for second shingles vaccination only.  I did not examine the patient.  I did review his medical history, medications, and allergies and vaccine consent form.  CMA gave vaccination. Patient tolerated well.  Wellington Curtis LABOR, FNP, DNP,FNP [facility] 09/15/2023 4:31 PM

## 2023-09-15 NOTE — Telephone Encounter (Signed)
 Patient is out of Sumatriptan  and would like to get refills please. She uses [facility] in [place]

## 2023-09-15 NOTE — Telephone Encounter (Signed)
 Will fill.

## 2023-09-28 ENCOUNTER — Encounter: Payer: Self-pay | Admitting: Family Medicine

## 2023-09-28 DIAGNOSIS — I890 Lymphedema, not elsewhere classified: Secondary | ICD-10-CM

## 2023-09-28 DIAGNOSIS — L603 Nail dystrophy: Secondary | ICD-10-CM

## 2023-09-28 DIAGNOSIS — S90229A Contusion of unspecified lesser toe(s) with damage to nail, initial encounter: Secondary | ICD-10-CM

## 2023-09-28 DIAGNOSIS — R609 Edema, unspecified: Secondary | ICD-10-CM

## 2023-09-29 NOTE — Telephone Encounter (Signed)
 Ok to send referrals as requested - I think lipedema therapist will fall under OT

## 2023-10-11 ENCOUNTER — Ambulatory Visit: Attending: Family Medicine | Admitting: Occupational Therapy

## 2023-10-11 ENCOUNTER — Ambulatory Visit: Admitting: Podiatry

## 2023-10-11 DIAGNOSIS — I890 Lymphedema, not elsewhere classified: Secondary | ICD-10-CM | POA: Diagnosis not present

## 2023-10-11 DIAGNOSIS — L603 Nail dystrophy: Secondary | ICD-10-CM

## 2023-10-11 DIAGNOSIS — R609 Edema, unspecified: Secondary | ICD-10-CM | POA: Insufficient documentation

## 2023-10-11 NOTE — Progress Notes (Signed)
 Chief Complaint  Patient presents with   Nail Problem    Was seen a year ago. Nail has split down the middle on the right great toe. No pain/discharge.    HPI: 53 y.o. female presenting today for follow-up evaluation of longitudinal splitting to the right hallux nail plate.  She says that it has worsened over the past year.  Past Medical History:  Diagnosis Date   Abdominal pain, right upper quadrant 2013   Anxiety    Cancer (HCC)    basal cells- [place]   COVID-19 2021   Endometriosis 01/2013   Gallbladder polyp    Lipidemia 2020   Lymphedema 01/2019   Migraine headache    3-4x/yr   Ovarian cyst 2014   right ovary   PONV (postoperative nausea and vomiting)    Vertigo    resolves quickly    Past Surgical History:  Procedure Laterality Date   ABLATION  2012   cyst removal   CHOLECYSTECTOMY N/A 10/19/2018   Procedure: LAPAROSCOPIC CHOLECYSTECTOMY;  Surgeon: Jordis Laneta FALCON, MD;  Location: [facility];  Service: General;  Laterality: N/A;   COLONOSCOPY WITH PROPOFOL  N/A 07/21/2020   Procedure: COLONOSCOPY WITH PROPOFOL ;  Surgeon: Jinny Carmine, MD;  Location: [facility];  Service: Endoscopy;  Laterality: N/A;   CYSTOSCOPY N/A 09/25/2020   Procedure: CYSTOSCOPY;  Surgeon: Lake Read, MD;  Location: [facility];  Service: Gynecology;  Laterality: N/A;   CYSTOSCOPY W/ URETERAL STENT PLACEMENT Right 09/25/2020   Procedure: CYSTOSCOPY WITH RETROGRADE PYELOGRAM/URETERAL STENT PLACEMENT;  Surgeon: Francisca Redell BROCKS, MD;  Location: [facility];  Service: Urology;  Laterality: Right;   LAPAROSCOPIC OVARIAN CYSTECTOMY Right 10/19/2018   Procedure: LAPAROSCOPIC RIGHT OVARIAN CYSTECTOMY;  Surgeon: Lake Read, MD;  Location: [facility];  Service: Gynecology;  Laterality: Right;   NECK SURGERY  02/22/2018   Artificial Disc replacement    OVARIAN CYST REMOVAL     POLYPECTOMY N/A 07/21/2020   Procedure: POLYPECTOMY;  Surgeon: Jinny Carmine, MD;  Location: [facility];  Service:  Endoscopy;  Laterality: N/A;   TOTAL LAPAROSCOPIC HYSTERECTOMY WITH SALPINGECTOMY Bilateral 09/25/2020   Procedure: TOTAL LAPAROSCOPIC HYSTERECTOMY WITH SALPINGECTOMY;  Surgeon: Lake Read, MD;  Location: [facility];  Service: Gynecology;  Laterality: Bilateral;    No Known Allergies    RT great toe 10/19/2022   RT hallux nail plate 0/0/7974  Physical Exam: General: The patient is alert and oriented x3 in no acute distress.  Dermatology: Longitudinal splitting of the nail plate noted to the right hallux.  Please see above noted photo  Vascular: Palpable pedal pulses bilaterally. Capillary refill within normal limits.  No appreciable edema.  No erythema.  Neurological: Grossly intact via light touch  Musculoskeletal Exam: No pedal deformities noted   Assessment/Plan of Care: 1.  Slight longitudinal discoloration along the central portion of the right hallux nail plate  -Unfortunately the toenail has worsened in appearance over the past year. -The nail was filed using a rotary bur -OTC Tolcylen antifungal topical was dispensed to begin applying daily -Return to clinic as needed.  If the nail continues to worsen I do believe the possible total temporary nail avulsion is appropriate to remove and allow a new nail to grow in potentially without complication     Thresa EMERSON Sar, DPM [facility]  Dr. Thresa EMERSON Sar, DPM    [address]  [place], [place] [address]                Office [phone]  Fax [phone]

## 2023-10-11 NOTE — Therapy (Unsigned)
 OUTPATIENT OCCUPATIONAL THERAPY EVALUATION  BLE/BLQ LYMPHEDEMA  Patient Name: Norma Wall MRN: [id number] DOB:[birth year], 53 y.o., female Today's Date: 10/11/2023  END OF SESSION:  OT End of Session - 10/11/23 0913     Visit Number 1    OT Start Time 0905            Past Medical History:  Diagnosis Date   Abdominal pain, right upper quadrant 2013   Anxiety    Cancer (HCC)    basal cells- [place]   COVID-19 2021   Endometriosis 01/2013   Gallbladder polyp    Lipidemia 2020   Lymphedema 01/2019   Migraine headache    3-4x/yr   Ovarian cyst 2014   right ovary   PONV (postoperative nausea and vomiting)    Vertigo    resolves quickly   Past Surgical History:  Procedure Laterality Date   ABLATION  2012   cyst removal   CHOLECYSTECTOMY N/A 10/19/2018   Procedure: LAPAROSCOPIC CHOLECYSTECTOMY;  Surgeon: Jordis Laneta FALCON, MD;  Location: [facility];  Service: General;  Laterality: N/A;   COLONOSCOPY WITH PROPOFOL  N/A 07/21/2020   Procedure: COLONOSCOPY WITH PROPOFOL ;  Surgeon: Jinny Carmine, MD;  Location: [facility];  Service: Endoscopy;  Laterality: N/A;   CYSTOSCOPY N/A 09/25/2020   Procedure: CYSTOSCOPY;  Surgeon: Lake Read, MD;  Location: [facility];  Service: Gynecology;  Laterality: N/A;   CYSTOSCOPY W/ URETERAL STENT PLACEMENT Right 09/25/2020   Procedure: CYSTOSCOPY WITH RETROGRADE PYELOGRAM/URETERAL STENT PLACEMENT;  Surgeon: Francisca Redell BROCKS, MD;  Location: [facility];  Service: Urology;  Laterality: Right;   LAPAROSCOPIC OVARIAN CYSTECTOMY Right 10/19/2018   Procedure: LAPAROSCOPIC RIGHT OVARIAN CYSTECTOMY;  Surgeon: Lake Read, MD;  Location: [facility];  Service: Gynecology;  Laterality: Right;   NECK SURGERY  02/22/2018   Artificial Disc replacement    OVARIAN CYST REMOVAL     POLYPECTOMY N/A 07/21/2020   Procedure: POLYPECTOMY;  Surgeon: Jinny Carmine, MD;  Location: [facility];  Service: Endoscopy;  Laterality: N/A;   TOTAL  LAPAROSCOPIC HYSTERECTOMY WITH SALPINGECTOMY Bilateral 09/25/2020   Procedure: TOTAL LAPAROSCOPIC HYSTERECTOMY WITH SALPINGECTOMY;  Surgeon: Lake Read, MD;  Location: [facility];  Service: Gynecology;  Laterality: Bilateral;   Patient Active Problem List   Diagnosis Date Noted   Hormone replacement therapy (HRT) 06/07/2023   Menopause 03/08/2023   Lower extremity numbness and tingling (Left) 02/24/2023   Lumbar radiculitis (Left) 02/24/2023   History of postoperative nausea and vomiting 11/25/2022   Lumbar facet joint hypertrophy (Multilevel) (Bilateral) 11/10/2022   Osteoarthritis of facet joint of lumbar spine 11/10/2022   Lumbosacral facet joint syndrome 11/10/2022   Osteoarthritis of lumbar spine 11/10/2022   Spondylosis without myelopathy or radiculopathy, lumbosacral region 11/10/2022   Lumbar lateral recess stenosis (Bilateral: L2-3, L3-4, L4-5) 11/10/2022   Lumbosacral foraminal stenosis (Bilateral: L3-4, L4-5) 11/10/2022   Ligamentum flavum hypertrophy (L4-5) 11/10/2022   Osteoarthritis of hip (Right) 11/10/2022   Enthesopathy of hip region (Right) 11/10/2022   Hamstring tendinitis of thigh (Right) 11/10/2022   Iliopsoas bursitis of hip (Left) 11/10/2022   Cervical disc protrusion at C5-6 (Left) 11/10/2022   Cervical disc herniation/extrusion at C6-7 level  (Right) 11/10/2022   Cervical C8 nerve root impingement (Right) 11/10/2022   Cervical lateral recess stenosis (Left: C5-6) (Right:C5-6) 11/10/2022   DDD (degenerative disc disease), cervical 11/10/2022   DDD (degenerative disc disease), lumbar 11/10/2022   Chronic hip pain (1ry area of Pain) (Right) 10/25/2022   Iliopsoas bursitis of hip (Right) 10/25/2022  Groin pain (Right) 10/25/2022   Chronic low back pain (Bilateral) w/o sciatica 10/25/2022   Chronic low back pain (2ry area of Pain) (Midline) w/o sciatica 10/25/2022   Lumbar facet joint pain 10/25/2022   Grade 1 Retrolisthesis of Lumbar region (L2/L3, L3/L4,  L4/L5) 10/25/2022   Abnormal MRI, Hip (Right) (09/04/2022) 10/24/2022   Abnormal MRI, lumbar spine (09/03/2022) 10/24/2022   Chronic pain syndrome 10/24/2022   Pharmacologic therapy 10/24/2022   Disorder of skeletal system 10/24/2022   Problems influencing health status 10/24/2022   Abnormal MRI, cervical spine (01/18/2018) 10/24/2022   Arthralgia of right hip 06/29/2022   Multilevel lumbosacral spondylosis with radiculopathy 06/29/2022   Low libido 05/13/2022   Hot flashes 05/13/2022   Hematuria 03/01/2022   Left lower quadrant abdominal pain 03/01/2022   Right upper quadrant abdominal pain 03/01/2022   S/P laparoscopic hysterectomy 09/25/2020   Pelvic pain    Polyp of ascending colon    Lymphedema 01/20/2019   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 Always felt I were larger than my friends. With both of my pregnancoies I had a lot of swelling in my legs ( 2000 and 2006). Also remember in 2010 noticing how swoen my ankles were after work.  SUBJECTIVE:                                                                                                                                                                                           SUBJECTIVE STATEMENT:  PERTINENT HISTORY:   PAIN:  Are you having pain? Yes: NPRS scale: 2-3/10 on average Pain location: full limb and worse in thighs Pain description: tenderness, dull deep ache, almost like  a bone ache, hypersensative to touch Aggravating factors: worse at rest, at night it's more, climbing stairs and inclines Relieving factors: conservative therapies- pump, epsom salts baths, vibration plate, elevation  PRECAUTIONS: Other: Lymphedema  FALLS:  Has patient fallen in last 6 months? No  LIVING ENVIRONMENT: Lives with:  spouse, 10 yo son ans our dog Lives in: House/apartment Stairs: Yes a few to enter; Internal: 1 flight steps; on right going up and   Has following equipment at home:  None  OCCUPATION: office manager   LEISURE: reading, crafts, photography, kyacking, fishing  HAND DOMINANCE: right   PRIOR LEVEL OF FUNCTION: Independent  PATIENT GOALS: Learn about MLD; get comfortable compression that works  OBJECTIVE: Note: Objective measures were completed at Evaluation unless otherwise noted.  COGNITION:  Overall cognitive status: Within functional limits for tasks assessed   OBSERVATIONS / OTHER ASSESSMENTS:   POSTURE: WNL  LE ROM: WFL  STRENGTH: WFL  Mild, Stage  II, Bilateral Lower Extremity Lymphedema 2/2 CVI and Obesity  Skin  Description Hyper-Keratosis Peau' de Orange Shiny Tight Fibrotic/ Indurated Fatty Doughy Spongy/ boggy       R>L x  x   Skin dry Flaky WNL Macerated   mildly      Color Redness Varicosities Blanching Hemosiderin Stain Mottled   x     x   Odor Malodorous Yeast Fungal infection  WNL      x   Temperature Warm Cool wnl    x     Pitting Edema   1+ 2+ 3+ 4+ Non-pitting         x   Girth Symmetrical Asymmetrical                   Distribution    R>L toes to groin    Stemmer Sign Positive Negative   +    Lymphorrhea History Of:  Present Absent     x    Wounds History Of Present Absent Venous Arterial Pressure Sheer     x        Signs of Infection Redness Warmth Erythema Acute Swelling Drainage Borders                    Sensation Light Touch Deep pressure Hypersensitivty   Present Impaired  Present Impaired Absent Impaired   x Tactile  x  x     Nails WNL   Fungus nail dystrophy   x     Hair Growth Symmetrical Asymmetrical   x    Skin Creases Base of toes  Ankles   Base of Fingers knees       Abdominal pannus Thigh Lobules  Face/neck   x x  x       BLE COMPARATIVE LIMB VOLUMETRICS TBA OT Rx 1  LANDMARK RIGHT  09/23/21  R LEG (A-D) N/A  R THIGH (E-G) ml  R FULL LIMB (A-G) ml  Limb Volume differential (LVD)  %  Volume change since initial %  Volume change overall V  (Blank rows = not tested)  LANDMARK LEFT  09/23/21  R LEG (A-D) N/A  R THIGH (E-G) ml  R FULL LIMB (A-G) ml  Limb Volume differential (LVD)  %  Volume change since initial %  Volume change overall %  (Blank rows = not tested)     LYMPHEDEMA ASSESSMENTS:   SURGERY TYPE/DATE: N/A non cancer related  INFECTIONS: denies hx cellulitis  WOUNDS: denies  INJURIES/TRAUMA: significant L achilles injury as a chid  LYMPHEDEMA ASSESSMENTS:    GAIT: Distance walked: >500' Assistive device utilized: None Level of assistance: Complete Independence Comments: WFL  LYMPHEDEMA LIFE IMPACT SCALE (LLIS):  TREATMENT DATE: ***   PATIENT EDUCATION:  Education details: *** Person educated: {Person educated:25204} Education method: {Education Method:25205} Education comprehension: {Education Comprehension:25206}  HOME EXERCISE PROGRAM: ***  ASSESSMENT:  CLINICAL IMPRESSION: Patient is a *** y.o. *** who was seen today for physical therapy evaluation and treatment for ***.   OBJECTIVE IMPAIRMENTS: {opptimpairments:25111}.   ACTIVITY LIMITATIONS: {activitylimitations:27494}  PARTICIPATION LIMITATIONS: {participationrestrictions:25113}  PERSONAL FACTORS: {Personal factors:25162} are also affecting patient's functional outcome.   REHAB POTENTIAL:  {rehabpotential:25112}  CLINICAL DECISION MAKING: {clinical decision making:25114}  EVALUATION COMPLEXITY: {Evaluation complexity:25115}   GOALS: Goals reviewed with patient? {yes/no:20286}  SHORT TERM GOALS: Target date: ***  *** Baseline: Goal status: INITIAL  2.  *** Baseline:  Goal status: INITIAL  3.  *** Baseline:  Goal status: INITIAL  4.  *** Baseline:  Goal status: INITIAL  5.  *** Baseline:  Goal status: INITIAL  6.  *** Baseline:  Goal status: INITIAL  LONG TERM GOALS: Target date: ***  *** Baseline:  Goal status: INITIAL  2.  *** Baseline:  Goal status: INITIAL  3.  *** Baseline:  Goal status: INITIAL  4.  *** Baseline:  Goal status: INITIAL  5.  *** Baseline:  Goal status: INITIAL  6.  *** Baseline:  Goal status: INITIAL   PLAN:  OT FREQUENCY: {rehab frequency:25116}  OT DURATION: {rehab duration:25117}  PLANNED INTERVENTIONS: {rehab planned interventions:25118::97110-Therapeutic exercises,97530- Therapeutic [phone]- Neuromuscular re-education,97535- Self Rjmz,02859- Manual therapy}  PLAN FOR NEXT SESSION: ***   Zebedee Dec, MS, OTR/L, CLT-LANA 10/11/23 9:19 AM

## 2023-10-18 ENCOUNTER — Ambulatory Visit: Admitting: Occupational Therapy

## 2023-10-18 ENCOUNTER — Encounter: Payer: Self-pay | Admitting: Occupational Therapy

## 2023-10-18 DIAGNOSIS — R609 Edema, unspecified: Secondary | ICD-10-CM | POA: Diagnosis not present

## 2023-10-18 DIAGNOSIS — I890 Lymphedema, not elsewhere classified: Secondary | ICD-10-CM | POA: Diagnosis not present

## 2023-10-18 NOTE — Therapy (Signed)
 OUTPATIENT OCCUPATIONAL THERAPY EVALUATION  BLE/BLQ  Lipo-Lymphedema   Patient Name: Norma Wall MRN: [id number] DOB:[birth year], 53 y.o., female Today's Date: 10/18/2023  END OF SESSION:  OT End of Session - 10/18/23 1505     Visit Number 2    Number of Visits 36    Date for OT Re-Evaluation 01/10/24    OT Start Time 0305    OT Stop Time 0405    OT Time Calculation (min) 60 min    Activity Tolerance Patient tolerated treatment well;No increased pain    Behavior During Therapy [facility] for tasks assessed/performed            Past Medical History:  Diagnosis Date   Abdominal pain, right upper quadrant 2013   Anxiety    Cancer (HCC)    basal cells- [place]   COVID-19 2021   Endometriosis 01/2013   Gallbladder polyp    Lipidemia 2020   Lymphedema 01/2019   Migraine headache    3-4x/yr   Ovarian cyst 2014   right ovary   PONV (postoperative nausea and vomiting)    Vertigo    resolves quickly   Past Surgical History:  Procedure Laterality Date   ABLATION  2012   cyst removal   CHOLECYSTECTOMY N/A 10/19/2018   Procedure: LAPAROSCOPIC CHOLECYSTECTOMY;  Surgeon: Jordis Laneta FALCON, MD;  Location: [facility];  Service: General;  Laterality: N/A;   COLONOSCOPY WITH PROPOFOL  N/A 07/21/2020   Procedure: COLONOSCOPY WITH PROPOFOL ;  Surgeon: Jinny Carmine, MD;  Location: [facility];  Service: Endoscopy;  Laterality: N/A;   CYSTOSCOPY N/A 09/25/2020   Procedure: CYSTOSCOPY;  Surgeon: Lake Read, MD;  Location: [facility];  Service: Gynecology;  Laterality: N/A;   CYSTOSCOPY W/ URETERAL STENT PLACEMENT Right 09/25/2020   Procedure: CYSTOSCOPY WITH RETROGRADE PYELOGRAM/URETERAL STENT PLACEMENT;  Surgeon: Francisca Redell BROCKS, MD;  Location: [facility];  Service: Urology;  Laterality: Right;   LAPAROSCOPIC OVARIAN CYSTECTOMY Right 10/19/2018   Procedure: LAPAROSCOPIC RIGHT OVARIAN CYSTECTOMY;  Surgeon: Lake Read, MD;  Location: [facility];  Service: Gynecology;   Laterality: Right;   NECK SURGERY  02/22/2018   Artificial Disc replacement    OVARIAN CYST REMOVAL     POLYPECTOMY N/A 07/21/2020   Procedure: POLYPECTOMY;  Surgeon: Jinny Carmine, MD;  Location: [facility];  Service: Endoscopy;  Laterality: N/A;   TOTAL LAPAROSCOPIC HYSTERECTOMY WITH SALPINGECTOMY Bilateral 09/25/2020   Procedure: TOTAL LAPAROSCOPIC HYSTERECTOMY WITH SALPINGECTOMY;  Surgeon: Lake Read, MD;  Location: [facility];  Service: Gynecology;  Laterality: Bilateral;   Patient Active Problem List   Diagnosis Date Noted   Hormone replacement therapy (HRT) 06/07/2023   Menopause 03/08/2023   Lower extremity numbness and tingling (Left) 02/24/2023   Lumbar radiculitis (Left) 02/24/2023   History of postoperative nausea and vomiting 11/25/2022   Lumbar facet joint hypertrophy (Multilevel) (Bilateral) 11/10/2022   Osteoarthritis of facet joint of lumbar spine 11/10/2022   Lumbosacral facet joint syndrome 11/10/2022   Osteoarthritis of lumbar spine 11/10/2022   Spondylosis without myelopathy or radiculopathy, lumbosacral region 11/10/2022   Lumbar lateral recess stenosis (Bilateral: L2-3, L3-4, L4-5) 11/10/2022   Lumbosacral foraminal stenosis (Bilateral: L3-4, L4-5) 11/10/2022   Ligamentum flavum hypertrophy (L4-5) 11/10/2022   Osteoarthritis of hip (Right) 11/10/2022   Enthesopathy of hip region (Right) 11/10/2022   Hamstring tendinitis of thigh (Right) 11/10/2022   Iliopsoas bursitis of hip (Left) 11/10/2022   Cervical disc protrusion at C5-6 (Left) 11/10/2022   Cervical disc herniation/extrusion at C6-7 level  (Right) 11/10/2022  Cervical C8 nerve root impingement (Right) 11/10/2022   Cervical lateral recess stenosis (Left: C5-6) (Right:C5-6) 11/10/2022   DDD (degenerative disc disease), cervical 11/10/2022   DDD (degenerative disc disease), lumbar 11/10/2022   Chronic hip pain (1ry area of Pain) (Right) 10/25/2022   Iliopsoas bursitis of hip (Right) 10/25/2022    Groin pain (Right) 10/25/2022   Chronic low back pain (Bilateral) w/o sciatica 10/25/2022   Chronic low back pain (2ry area of Pain) (Midline) w/o sciatica 10/25/2022   Lumbar facet joint pain 10/25/2022   Grade 1 Retrolisthesis of Lumbar region (L2/L3, L3/L4, L4/L5) 10/25/2022   Abnormal MRI, Hip (Right) (09/04/2022) 10/24/2022   Abnormal MRI, lumbar spine (09/03/2022) 10/24/2022   Chronic pain syndrome 10/24/2022   Pharmacologic therapy 10/24/2022   Disorder of skeletal system 10/24/2022   Problems influencing health status 10/24/2022   Abnormal MRI, cervical spine (01/18/2018) 10/24/2022   Arthralgia of right hip 06/29/2022   Multilevel lumbosacral spondylosis with radiculopathy 06/29/2022   Low libido 05/13/2022   Hot flashes 05/13/2022   Hematuria 03/01/2022   Left lower quadrant abdominal pain 03/01/2022   Right upper quadrant abdominal pain 03/01/2022   S/P laparoscopic hysterectomy 09/25/2020   Pelvic pain    Polyp of ascending colon    Lymphedema 01/20/2019   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 Always felt I were larger than my friends. With both of my pregnancies I had a lot of swelling in my legs ( 2000 and 2006). Also remember in 2010 noticing how swoen my ankles were after work.  SUBJECTIVE:   SUBJECTIVE STATEMENT: Initial BLE comparative limb volumetrics reveal LVD greater on the R than the left at all segments, but  essentially symmetrical volume distribution on R and L. Next session teach compression bandaging to knee on the R. RLE/RLQ MD if time allows. Cont per POC.  (OT INITIAL EVAL: Joslynn Jamroz is referred to Occupational Therapy by Jon Eva, MD, for evaluation and treatment of BLE lymphedema. Pt relays that she requested referral so she could learn more about her diagnosis and explore treatment options.  Pt reports,  When growing up I always felt like I had big legs and feet. During both pregnancies her legs were abnormally swollen, and they remained swollen afterwards. Pt endorses family hx of chronic leg swelling in her mother and maternal grandmother. Pt reports she has not previously undergone formal lymphedema treatment in the past, but she tries to follow conservative measures, including leg elevation her.  She occasionally wears knee length compression stockings, but has never found any that are comfortable and that fit.  They roll down and they hurt. She reports she is diagnoses with CVI by Ortencia Lema, MD,  at the [facility] in [place], [place]. Pt reports she has undergone several vein ablations. She reports she also uses a compression pump regularly, but she is unsure of the manufacturer. Pt's goals for OT are,  to l learn about MLD and get comfortable compression that works.   PERTINENT HISTORY: CVI (  multiple vein ablations at chronic hip and back pain 2/2 multiple complex problems including multiple level DJD, OA,  Multilevel lumbosacral spondylosis with radiculopathy, tendonitis, bursitis, obesity (30-39.9), Lipedema  PAIN:  Are you having pain? Yes: NPRS scale: 2-3/10 on average Pain location: full limb and worse in thighs Pain description: tenderness, dull deep ache, almost like a bone ache, hypersensative to touch Aggravating factors: worse at rest, at night it's more, climbing stairs and inclines Relieving factors: conservative therapies- pump, epsom  salts baths, vibration plate, elevation  PRECAUTIONS: Other: Lymphedema  FALLS:  Has patient fallen in last 6 months? No  LIVING ENVIRONMENT: Lives with:  spouse, 45 yo son ans our dog Lives in: House/apartment Stairs: Yes a few to enter; Internal: 1 flight steps; on right going up and   Has following equipment at home: None  OCCUPATION: office manager   LEISURE: reading, crafts, photography, kyacking, fishing  HAND DOMINANCE: right   PRIOR LEVEL OF FUNCTION: Independent  PATIENT GOALS: Learn about MLD; get comfortable compression that works  OBJECTIVE: Note: Objective measures were completed at Evaluation unless otherwise noted.  COGNITION:  Overall cognitive status: Within functional limits for tasks assessed   OBSERVATIONS / OTHER ASSESSMENTS:   POSTURE: WNL  LE ROM: WFL  STRENGTH: WFL  Mild, Stage  II, Bilateral Lower Extremity Lymphedema 2/2 CVI and Obesity  Skin  Description Hyper-Keratosis Peau d' Orange Shiny Tight Fibrotic/ Indurated Fatty Doughy Spongy/ boggy   x    Fatty fibrosis, nodular fat x x x   Skin dry Flaky WNL Macerated     x    Color Redness Varicosities Blanching Hemosiderin Stain Mottled    x    x   Odor Malodorous Yeast Fungal infection  WNL      x   Temperature Warm Cool wnl    x     Pitting Edema   1+ 2+ 3+ 4+ Non-pitting         x   Girth Symmetrical Asymmetrical                   Distribution   X Feet spared  toes to groin    Stemmer Sign Positive Negative    Feet spared   Lymphorrhea History Of:  Present Absent     x    Wounds History Of Present Absent Venous Arterial Pressure Sheer     x        Signs of Infection Redness Warmth Erythema Acute Swelling Drainage Borders                    Sensation Light Touch Deep pressure Hypersensitivty   In Tact Impaired In Tact Impaired Absent Impaired   x Tactile  x   x    Nails WNL   Fungus nail dystrophy   x     Hair Growth Symmetrical Asymmetrical    x    Skin Creases Base of toes  Ankles   Base of Fingers knees       Abdominal pannus Thigh Lobules  Face/neck    X Pantaloon deformity  x       BLE COMPARATIVE LIMB VOLUMETRICS TBA OT Rx 1  LANDMARK RIGHT    R LEG (A-D) 7728.4 ml  R THIGH (E-G) 9757.0 ml  R FULL LIMB (A-G) 17485.3 ml  Limb Volume differential (LVD)  R Leg LVD= 3.6%, R>L, R THIGH LVD = 4.9, R>L, Full Limb LVD= 4.3%, R>L  Volume change since initial %  Volume change overall V  (Blank rows = not tested)  LANDMARK LEFT  09/23/21  L LEG (A-D) 7447.0 ml  L THIGH (E-G) 9276.3 ml  L  FULL LIMB (A-G) 16725.2 ml  Limb Volume differential (LVD)  %  Volume change since initial %  Volume change overall %  (Blank rows = not tested)     LYMPHEDEMA ASSESSMENTS:   SURGERY TYPE/DATE: N/A non cancer related  INFECTIONS: denies hx cellulitis  WOUNDS: denies  INJURIES/TRAUMA: significant L achilles injury as a chid  LYMPHEDEMA ASSESSMENTS:   GAIT: Distance walked: >500' Assistive device utilized: None Level of assistance: Complete Independence Comments: WFL  LYMPHEDEMA LIFE IMPACT SCALE (LLIS):   Initial 10/11/23 60.29% (The extent of which lymphedema-related problems impacted your life in the last week)                                                                                                                       TREATMENT THIS DATE: OT evaluation Pt/ family edu   PATIENT EDUCATION:  Education details: Discussed differential diagnoses for various swelling disorders. Provided basic level education regarding lymphatic structure and function, etiology, onset patterns, stages of progression, and prevention to limit infection risk, worsening condition and further functional decline. Pt edu for aught interaction between blood circulatory system and lymphatic circulation.Discussed  impact of gravity and co-morbidities on lymphatic function. Outlined Complete Decongestive Therapy (CDT)  as standard of care and  provided in depth information regarding 4 primary components of Intensive and Self Management Phases, including Manual Lymph Drainage (MLD), compression wrapping and garments, skin care, and therapeutic exercise. Dempsey discussion with re need for frequent attendance and high burden of care when caregiver is needed, impact of co morbidities. We discussed  the chronic, progressive nature of lymphedema and Importance of daily, ongoing LE self-care essential for limiting progression and infection risk.  Person educated: Patient  Education method: Explanation, Demonstration, and Handouts Education comprehension: verbalized understanding, returned demonstration, verbal cues required, and needs further education  LYMPHEDEMA SELF-CARE HOME PROGRAM:  LYMPHEDEMA SELF-CARE HOME PROGRAM: BLE lymphatic pumping there ex- 1 set of 10 each element, in order. Hold 5. 2 x daily 2. Daily, short stretch, thigh length, multilayer compression bandages during Intensive Phase CDT- one LE at a time 3. During Self-management Phase:   Custom-made gradient compression garments and HOS devices are medically necessary because they are uniquely sized and shaped to fit the exact dimensions of the affected extremities, and to provide appropriate medical grade, graduated compression essential for optimally managing chronic, progressive lymphedema. Multiple custom compression garments are needed to ensure proper hygiene to limit infection risk. Custom compression garments should be replaced q 3-6 months When worn consistently for optimal lipo-lymphedema self-management over time. HOS devices, medically necessary to limit fibrosis buildup in tisse, should be replaced q 2 years and PRN when worn out.   4. Daily skin care with low ph lotion matching skin ph 5. Daily use of advanced, sequential , pneumatic compression device (Flexitouch)  combined with self-MLD     ASSESSMENT:  CLINICAL IMPRESSION: Sarina Robleto is a 53 yo female  presenting with moderate, stage II, BLE, lipo-lymphedema 2/2 primary, stage 2, type 3 ( buttocks, abdomen and ankles), lipedema. Lipidema is a primary disorder characterized by the symmetrical fatty enlargement of bilateral legs, buttocks, abdomen, and sometimes the arms, due to excessive fat deposition, or storage, in the lower quadrants. The upper half of the body is unaffected, and despite recent weight loss, the lower half of the body is disproportionately larger than the upper half. As lipedema progresses fibrotic, nodular fat accumulates in the subcutis putting pressure on delicate lymphatics, which in turn leads to excessive, high protein lymph accumulation, resulting in chronic, progressive lymphedema. The combined condition of lipo-lymphedema is difficult to treat as lipedema does not typically respond well to Complete Decongestive Therapy (CDT). The lymphedema side of the condition may respond to varying degrees. Well fitting, comfortable compression garments that do not constrict or roll down often provide pain relief and enable improved functional performance. An advanced, sequential, pneumatic, compression device, the [employer] Flexitouch device is recommended because it addresses the affected abdomen and buttocks, proximal to the regional, inguinal lymph nodes. The advanced Flexitouch device is known reduce pain and lymphatic congestion, as well as infection risk in those with lipo-lymphedema. If Pt is using a basic vaso-pneumatic pump with squeeze and release function, it should be replaced with an advanced sequential pneumatic compression device for optimal, long term, daily, lipo-lymphedema self-management over time.    Ms Halberstadt will benefit from skilled Occupational Therapy to reduce limb swelling and associated pain, to limit progression and infection risk associated with chronic, progressive Lymphedema, and to learn lymphedema self management protocol for long term self management.   SABRA  BLE lipo- lymphedema limits functional performance in all occupational domains, including functional mobility and ambulation, basic and instrumental ADLs, productive activities, leisure pursuits, social participation and quality of life. This patient will benefit from modified Intensive and Self-management Phase CDT . In addition to MLD, therex, skin care and compression bandaging below the knees, Pt will be fitted with custom knee length compression stockings:   OBJECTIVE IMPAIRMENTS: decreased knowledge of condition, decreased knowledge of use of DME, decreased mobility, decreased ROM, increased edema, impaired flexibility, impaired sensation, obesity, pain, and chronic, progressive, painful BLE/BLQ swelling.   ACTIVITY LIMITATIONS:  Limited Functional ambulation and mobility (dependent sitting, standing, walking, squatting, stairs, transfers, bed mobility) Impaired basic and instrumental ADLs: sleeping, grooming toe nails, inspecting skin and performing LE/LQ skin care, LB bathing, LB dressing, fitting shoes,  Impaired social participation:  when extended standing, walking and sitting are necessary, such as when shopping, going to the movies, attending community and church functions Impaired leisure pursuits when extended sitting, standing , walking required Quality of Life (Psychosocial concerns ):   PERSONAL FACTORS: Time since onset of injury/illness/exacerbation and 3+ comorbidities: primary lipidema, obesity, chronic pain are also affecting patient's functional outcome.   REHAB POTENTIAL: Good  EVALUATION COMPLEXITY: Moderate   GOALS:Goals reviewed with patient? Yes   SHORT TERM GOALS: Target date: 4th OT Rx visit    Pt will demonstrate understanding of lymphedema precautions and prevention strategies with modified independence using a printed reference to identify at least 5 precautions and discussing how s/he may implement them into daily life to reduce risk of progression with  modified assistance Baseline: max a Goal status: INITIAL   2.  With modified independence (extra time)  Pt will be able to apply  multilayer, thigh length, compression wraps using gradient techniques  to decrease limb volume, to limit infection risk, and to limit lymphedema progression.  Given this patient's Intake score of tbd % on the  Baseline: Dependent Goal status: INITIAL     LONG TERM GOALS: Target date: 01/09/24(12 WEEKS)     Given this patient's Intake score of 60.29% on the Lymphedema Life Impact Scale (LLIS), patient will experience a reduction of at least 20 points in the extent to which lymphedema-elated problems impact her life on a weekly basis to her perceived level to improve functional performance and quality of life (QOL). Baseline: max a Goal status: INITIAL   2.  With modified independence (extra time and assistive devices) Pt will be able to don and doff appropriate compression garments and/or devices to control BLE lymphedema and to limit progression.  Baseline: Dependent Goal status: INITIAL  3. Pt  will be able to perform each lymphedema self-care home program component ( simple self MLD, lymphatic pumping ther ex, skin care, compression therapy) using correct techniques using printed work bbook as reference by DC for optimal LE self management overe time to limit oprogression.   4. Pt will achieve at least a 10% volume reductions bilaterally below the knees to return limb to more typical size and shape, to limit infection risk and LE progression, to decrease pain, to improve function, and to improve body image and QOL. Baseline: Dependent Goal status: INITIAL   5. Pt will achieve and sustain at least 85% attendance at OT sessions, and with compliance with all LE self-care home program components throughout CDT, including modified simple self-MLD, daily skin care and inspection, lymphatic pumping the ex and appropriate compression to limit lymphedema progression and  to limit further functional decline. Baseline: Dependent Goal status: INITIAL PLAN:  OT FREQUENCY: 2x/week and PRN  OT DURATION: other: 16 weeks and PRN due to bilaterality  PLANNED INTERVENTIONS: Complete Decongestive Therapy (CDT): Manual therapy (manual lymphatic drainage (MLD), therapeutic lymphatic pumping exercise, skin care to limit infection risk, compression ( wraps and garments, devices prn)[id number]-Therapeutic exercises, 97530- Therapeutic activity, 97535- Self Care, [id number]- Manual therapy, Patient/Family education, Manual lymph drainage, Compression bandaging, and DME instructions  PLAN FOR NEXT SESSION:  BLE comparative limb volumetrics Multilayer compression bandaging to one leg at a time. Start with knee length and build tolerance to thigh length. Pt. Family edu   Zebedee Dec, MS, OTR/L, [facility] 10/18/23 4:06 PM

## 2023-10-25 ENCOUNTER — Ambulatory Visit: Admitting: Occupational Therapy

## 2023-10-25 ENCOUNTER — Encounter: Payer: Self-pay | Admitting: Occupational Therapy

## 2023-10-25 DIAGNOSIS — I890 Lymphedema, not elsewhere classified: Secondary | ICD-10-CM

## 2023-10-25 DIAGNOSIS — R609 Edema, unspecified: Secondary | ICD-10-CM | POA: Diagnosis not present

## 2023-10-25 NOTE — Therapy (Signed)
 OUTPATIENT OCCUPATIONAL THERAPY EVALUATION  BLE/BLQ  Lipo-Lymphedema   Patient Name: Norma Wall MRN: [id number] DOB:22-Dec-1970, 53 y.o., female Today's Date: 10/25/2023  END OF SESSION:  OT End of Session - 10/25/23 1119     Visit Number 3    Number of Visits 36    Date for Recertification  01/10/24    OT Start Time 1115    OT Stop Time 1210    OT Time Calculation (min) 55 min    Activity Tolerance Patient tolerated treatment well;No increased pain    Behavior During Therapy [facility] for tasks assessed/performed            Past Medical History:  Diagnosis Date   Abdominal pain, right upper quadrant 2013   Anxiety    Cancer (HCC)    basal cells- [place]   COVID-19 2021   Endometriosis 01/2013   Gallbladder polyp    Lipidemia 2020   Lymphedema 01/2019   Migraine headache    3-4x/yr   Ovarian cyst 2014   right ovary   PONV (postoperative nausea and vomiting)    Vertigo    resolves quickly   Past Surgical History:  Procedure Laterality Date   ABLATION  2012   cyst removal   CHOLECYSTECTOMY N/A 10/19/2018   Procedure: LAPAROSCOPIC CHOLECYSTECTOMY;  Surgeon: Jordis Laneta FALCON, MD;  Location: [facility];  Service: General;  Laterality: N/A;   COLONOSCOPY WITH PROPOFOL  N/A 07/21/2020   Procedure: COLONOSCOPY WITH PROPOFOL ;  Surgeon: Jinny Carmine, MD;  Location: [facility];  Service: Endoscopy;  Laterality: N/A;   CYSTOSCOPY N/A 09/25/2020   Procedure: CYSTOSCOPY;  Surgeon: Lake Read, MD;  Location: [facility];  Service: Gynecology;  Laterality: N/A;   CYSTOSCOPY W/ URETERAL STENT PLACEMENT Right 09/25/2020   Procedure: CYSTOSCOPY WITH RETROGRADE PYELOGRAM/URETERAL STENT PLACEMENT;  Surgeon: Francisca Redell BROCKS, MD;  Location: [facility];  Service: Urology;  Laterality: Right;   LAPAROSCOPIC OVARIAN CYSTECTOMY Right 10/19/2018   Procedure: LAPAROSCOPIC RIGHT OVARIAN CYSTECTOMY;  Surgeon: Lake Read, MD;  Location: [facility];  Service: Gynecology;   Laterality: Right;   NECK SURGERY  02/22/2018   Artificial Disc replacement    OVARIAN CYST REMOVAL     POLYPECTOMY N/A 07/21/2020   Procedure: POLYPECTOMY;  Surgeon: Jinny Carmine, MD;  Location: [facility];  Service: Endoscopy;  Laterality: N/A;   TOTAL LAPAROSCOPIC HYSTERECTOMY WITH SALPINGECTOMY Bilateral 09/25/2020   Procedure: TOTAL LAPAROSCOPIC HYSTERECTOMY WITH SALPINGECTOMY;  Surgeon: Lake Read, MD;  Location: [facility];  Service: Gynecology;  Laterality: Bilateral;   Patient Active Problem List   Diagnosis Date Noted   Hormone replacement therapy (HRT) 06/07/2023   Menopause 03/08/2023   Lower extremity numbness and tingling (Left) 02/24/2023   Lumbar radiculitis (Left) 02/24/2023   History of postoperative nausea and vomiting 11/25/2022   Lumbar facet joint hypertrophy (Multilevel) (Bilateral) 11/10/2022   Osteoarthritis of facet joint of lumbar spine 11/10/2022   Lumbosacral facet joint syndrome 11/10/2022   Osteoarthritis of lumbar spine 11/10/2022   Spondylosis without myelopathy or radiculopathy, lumbosacral region 11/10/2022   Lumbar lateral recess stenosis (Bilateral: L2-3, L3-4, L4-5) 11/10/2022   Lumbosacral foraminal stenosis (Bilateral: L3-4, L4-5) 11/10/2022   Ligamentum flavum hypertrophy (L4-5) 11/10/2022   Osteoarthritis of hip (Right) 11/10/2022   Enthesopathy of hip region (Right) 11/10/2022   Hamstring tendinitis of thigh (Right) 11/10/2022   Iliopsoas bursitis of hip (Left) 11/10/2022   Cervical disc protrusion at C5-6 (Left) 11/10/2022   Cervical disc herniation/extrusion at C6-7 level  (Right) 11/10/2022  Cervical C8 nerve root impingement (Right) 11/10/2022   Cervical lateral recess stenosis (Left: C5-6) (Right:C5-6) 11/10/2022   DDD (degenerative disc disease), cervical 11/10/2022   DDD (degenerative disc disease), lumbar 11/10/2022   Chronic hip pain (1ry area of Pain) (Right) 10/25/2022   Iliopsoas bursitis of hip (Right) 10/25/2022    Groin pain (Right) 10/25/2022   Chronic low back pain (Bilateral) w/o sciatica 10/25/2022   Chronic low back pain (2ry area of Pain) (Midline) w/o sciatica 10/25/2022   Lumbar facet joint pain 10/25/2022   Grade 1 Retrolisthesis of Lumbar region (L2/L3, L3/L4, L4/L5) 10/25/2022   Abnormal MRI, Hip (Right) (09/04/2022) 10/24/2022   Abnormal MRI, lumbar spine (09/03/2022) 10/24/2022   Chronic pain syndrome 10/24/2022   Pharmacologic therapy 10/24/2022   Disorder of skeletal system 10/24/2022   Problems influencing health status 10/24/2022   Abnormal MRI, cervical spine (01/18/2018) 10/24/2022   Arthralgia of right hip 06/29/2022   Multilevel lumbosacral spondylosis with radiculopathy 06/29/2022   Low libido 05/13/2022   Hot flashes 05/13/2022   Hematuria 03/01/2022   Left lower quadrant abdominal pain 03/01/2022   Right upper quadrant abdominal pain 03/01/2022   S/P laparoscopic hysterectomy 09/25/2020   Pelvic pain    Polyp of ascending colon    Lymphedema 01/20/2019   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 Always felt I were larger than my friends. With both of my pregnancies I had a lot of swelling in my legs ( 2000 and 2006). Also remember in 2010 noticing how swoen my ankles were after work.  SUBJECTIVE:   SUBJECTIVE STATEMENT: Pt presents for OT visit to address BLE Lipo-lymphedema. Pt denies pain this morning, but rates lipo-lymohedema  related pain, on average, as 3/10 over the last 24 hours.  (OT INITIAL EVAL: Shannon Balthazar is referred to Occupational Therapy by Jon Eva, MD, for evaluation and treatment of BLE lymphedema. Pt relays that she requested referral so she could learn more about her diagnosis and explore treatment options.  Pt reports,  When growing up I always felt like I had big legs and feet. During both pregnancies her legs were abnormally swollen, and they remained swollen afterwards. Pt endorses family hx of chronic leg swelling in her mother and maternal grandmother. Pt reports she has not previously undergone formal lymphedema treatment in the past, but she tries to follow conservative measures, including leg elevation her.  She occasionally wears knee length compression stockings, but has never found any that are comfortable and that fit.  They roll down and they hurt. She reports she is diagnoses with CVI by Ortencia Lema, MD,  at the [facility] in [place], [place]. Pt reports she has undergone several vein ablations. She reports she also uses a compression pump regularly, but she is unsure of the manufacturer. Pt's goals for OT are,  to l learn about MLD and get comfortable compression that works.   PERTINENT HISTORY: CVI ( multiple vein ablations at chronic hip and back pain 2/2 multiple complex problems including multiple level  DJD, OA,  Multilevel lumbosacral spondylosis with radiculopathy, tendonitis, bursitis, obesity (30-39.9), Lipedema  PAIN:  Are you having pain? Yes: NPRS scale: 2-3/10 on average Pain location: full limb and worse in thighs Pain description: tenderness, dull deep ache, almost like a bone ache, hypersensative to touch Aggravating factors: worse at rest, at night it's more, climbing stairs and inclines Relieving factors: conservative therapies- pump, epsom salts baths, vibration plate, elevation  PRECAUTIONS: Other: Lymphedema  FALLS:  Has patient  fallen in last 6 months? No  LIVING ENVIRONMENT: Lives with:  spouse, 83 yo son ans our dog Lives in: House/apartment Stairs: Yes a few to enter; Internal: 1 flight steps; on right going up and   Has following equipment at home: None  OCCUPATION: office manager   LEISURE: reading, crafts, photography, kyacking, fishing  HAND DOMINANCE: right   PRIOR LEVEL OF FUNCTION: Independent  PATIENT GOALS: Learn about MLD; get comfortable compression that works  OBJECTIVE: Note: Objective measures were completed at Evaluation unless otherwise noted.  COGNITION:  Overall cognitive status: Within functional limits for tasks assessed   OBSERVATIONS / OTHER ASSESSMENTS:   POSTURE: WNL  LE ROM: WFL  STRENGTH: WFL  Mild, Stage  II, Bilateral Lower Extremity Lymphedema 2/2 CVI and Obesity  Skin  Description Hyper-Keratosis Peau d' Orange Shiny Tight Fibrotic/ Indurated Fatty Doughy Spongy/ boggy   x    Fatty fibrosis, nodular fat x x x   Skin dry Flaky WNL Macerated     x    Color Redness Varicosities Blanching Hemosiderin Stain Mottled    x    x   Odor Malodorous Yeast Fungal infection  WNL      x   Temperature Warm Cool wnl    x     Pitting Edema   1+ 2+ 3+ 4+ Non-pitting         x   Girth Symmetrical Asymmetrical                   Distribution   X Feet spared  toes to groin    Stemmer Sign Positive Negative    Feet spared   Lymphorrhea History Of:  Present Absent     x    Wounds History Of Present Absent Venous Arterial Pressure Sheer     x        Signs of Infection Redness Warmth Erythema Acute Swelling Drainage Borders                    Sensation Light Touch Deep pressure Hypersensitivty   In Tact Impaired In Tact Impaired Absent Impaired   x Tactile  x   x    Nails WNL   Fungus nail dystrophy   x     Hair Growth Symmetrical Asymmetrical   x    Skin Creases Base of toes  Ankles   Base of Fingers knees       Abdominal pannus Thigh  Lobules  Face/neck    X Pantaloon deformity  x       BLE COMPARATIVE LIMB VOLUMETRICS TBA OT Rx 1  LANDMARK RIGHT    R LEG (A-D) 7728.4 ml  R THIGH (E-G) 9757.0 ml  R FULL LIMB (A-G) 17485.3 ml  Limb Volume differential (LVD)  R Leg LVD= 3.6%, R>L, R THIGH LVD = 4.9, R>L, Full Limb LVD= 4.3%, R>L  Volume change since initial %  Volume change overall V  (Blank rows = not tested)  LANDMARK LEFT  09/23/21  L LEG (A-D) 7447.0 ml  L THIGH (E-G) 9276.3 ml  L  FULL LIMB (A-G) 16725.2 ml  Limb Volume differential (LVD)  %  Volume change since initial %  Volume change overall %  (Blank rows = not tested)     LYMPHEDEMA ASSESSMENTS:   SURGERY TYPE/DATE: N/A non cancer related  INFECTIONS: denies hx cellulitis  WOUNDS: denies  INJURIES/TRAUMA: significant L achilles injury as a chid  LYMPHEDEMA ASSESSMENTS:   GAIT: Distance walked: >500' Assistive device utilized: None Level of assistance: Complete Independence Comments: WFL  LYMPHEDEMA LIFE IMPACT SCALE (LLIS):   Initial 10/11/23 60.29% (The extent of which lymphedema-related problems impacted your life in the last week)                                                                                                                       TREATMENT THIS DATE: OT evaluation Pt/ family edu   PATIENT EDUCATION:  Education details: Discussed differential diagnoses for various swelling disorders. Provided basic level education regarding lymphatic structure and function, etiology, onset patterns, stages of progression, and prevention to limit infection risk, worsening condition and further functional decline. Pt edu for aught interaction between blood circulatory system and lymphatic circulation.Discussed  impact of gravity and co-morbidities on lymphatic function. Outlined Complete Decongestive Therapy (CDT)  as standard of care and provided in depth information regarding 4 primary components of Intensive and Self Management  Phases, including Manual Lymph Drainage (MLD), compression wrapping and garments, skin care, and therapeutic exercise. Dempsey discussion with re need for frequent attendance and high burden of care when caregiver is needed, impact of co morbidities. We discussed  the chronic, progressive nature of lymphedema and Importance of daily, ongoing LE self-care essential for limiting progression and infection risk.  Person educated: Patient  Education method: Explanation, Demonstration, and Handouts Education comprehension: verbalized understanding, returned demonstration, verbal cues required, and needs further education  LYMPHEDEMA SELF-CARE HOME PROGRAM:  LYMPHEDEMA SELF-CARE HOME PROGRAM: BLE lymphatic pumping there ex- 1 set of 10 each element, in order. Hold 5. 2 x daily 2. Daily, short stretch, thigh length, multilayer compression bandages during Intensive Phase CDT- one LE at a time 3. During Self-management Phase:   Custom-made gradient compression garments and HOS devices are medically necessary because they are uniquely sized and shaped to fit the exact dimensions of the affected extremities, and to provide appropriate medical grade, graduated compression essential for optimally managing chronic, progressive lymphedema. Multiple custom compression garments are needed to ensure proper hygiene to limit infection risk. Custom compression garments should be replaced q 3-6 months When worn consistently for optimal lipo-lymphedema self-management over time. HOS devices, medically necessary to limit fibrosis buildup in tisse, should be replaced q 2 years and PRN when worn out.   4. Daily skin care with low ph lotion matching skin ph 5. Daily use of advanced, sequential , pneumatic compression device (Flexitouch) combined with self-MLD     ASSESSMENT:  CLINICAL IMPRESSION: Commenced RLE/RLQ MLD and provided  Pt edu for simple self MLD throughout session. Pt tolerated manual therapy and asked excellent  questions. Provided Printed handout with MLD sequences on them for reference. Pt not sure she will tolerate compression wraps to R leg. We'll try that next session. Cont as per POC.   Contina Strain is a 53 yo female presenting with moderate, stage II, BLE, lipo-lymphedema 2/2 primary, stage 2, type 3 ( buttocks, abdomen and ankles), lipedema. Lipidema is a primary disorder characterized by the symmetrical fatty enlargement of bilateral legs, buttocks, abdomen, and sometimes the arms, due to excessive fat deposition, or storage, in the lower quadrants. The upper half of the body is unaffected, and despite recent weight loss, the lower half of the body is disproportionately larger than the upper half. As lipedema progresses fibrotic, nodular fat accumulates in the subcutis putting pressure on delicate lymphatics, which in turn leads to excessive, high protein lymph accumulation, resulting in chronic, progressive lymphedema. The combined condition of lipo-lymphedema is difficult to treat as lipedema does not typically respond well to Complete Decongestive Therapy (CDT). The lymphedema side of the condition may respond to varying degrees. Well fitting, comfortable compression garments that do not constrict or roll down often provide pain relief and enable improved functional performance. An advanced, sequential, pneumatic, compression device, the [employer] Flexitouch device is recommended because it addresses the affected abdomen and buttocks, proximal to the regional, inguinal lymph nodes. The advanced Flexitouch device is known reduce pain and lymphatic congestion, as well as infection risk in those with lipo-lymphedema. If Pt is using a basic vaso-pneumatic pump with squeeze and release function, it should be replaced with an advanced sequential pneumatic compression device for optimal, long term, daily, lipo-lymphedema self-management over time.    Ms Henle will benefit from skilled Occupational Therapy  to reduce limb swelling and associated pain, to limit progression and infection risk associated with chronic, progressive Lymphedema, and to learn lymphedema self management protocol for long term self management.   SABRA BLE lipo- lymphedema limits functional performance in all occupational domains, including functional mobility and ambulation, basic and instrumental ADLs, productive activities, leisure pursuits, social participation and quality of life. This patient will benefit from modified Intensive and Self-management Phase CDT . In addition to MLD, therex, skin care and compression bandaging below the knees, Pt will be fitted with custom knee length compression stockings:   OBJECTIVE IMPAIRMENTS: decreased knowledge of condition, decreased knowledge of use of DME, decreased mobility, decreased ROM, increased edema, impaired flexibility, impaired sensation, obesity, pain, and chronic, progressive, painful BLE/BLQ swelling.   ACTIVITY LIMITATIONS:  Limited Functional ambulation and mobility (dependent sitting, standing, walking, squatting, stairs, transfers, bed mobility) Impaired basic and instrumental ADLs: sleeping, grooming toe nails, inspecting skin and performing LE/LQ skin care, LB bathing, LB dressing, fitting shoes,  Impaired social participation:  when extended standing, walking and sitting are necessary, such as when shopping, going to the movies, attending community and church functions Impaired leisure pursuits when extended sitting, standing , walking required Quality of Life (Psychosocial concerns ):   PERSONAL FACTORS: Time since onset of injury/illness/exacerbation and 3+ comorbidities: primary lipidema, obesity, chronic pain are also affecting patient's functional outcome.   REHAB POTENTIAL: Good  EVALUATION COMPLEXITY: Moderate   GOALS:Goals reviewed with patient? Yes   SHORT TERM GOALS: Target date: 4th OT Rx visit    Pt will demonstrate understanding of lymphedema  precautions and prevention strategies with modified independence using a printed reference to identify at least 5 precautions and discussing how  s/he may implement them into daily life to reduce risk of progression with modified assistance Baseline: max a Goal status: INITIAL   2.  With modified independence (extra time)  Pt will be able to apply multilayer, thigh length, compression wraps using gradient techniques  to decrease limb volume, to limit infection risk, and to limit lymphedema progression.  Given this patient's Intake score of tbd % on the  Baseline: Dependent Goal status: INITIAL     LONG TERM GOALS: Target date: 01/09/24(12 WEEKS)     Given this patient's Intake score of 60.29% on the Lymphedema Life Impact Scale (LLIS), patient will experience a reduction of at least 20 points in the extent to which lymphedema-elated problems impact her life on a weekly basis to her perceived level to improve functional performance and quality of life (QOL). Baseline: max a Goal status: INITIAL   2.  With modified independence (extra time and assistive devices) Pt will be able to don and doff appropriate compression garments and/or devices to control BLE lymphedema and to limit progression.  Baseline: Dependent Goal status: INITIAL  3. Pt  will be able to perform each lymphedema self-care home program component ( simple self MLD, lymphatic pumping ther ex, skin care, compression therapy) using correct techniques using printed work bbook as reference by DC for optimal LE self management overe time to limit oprogression.   4. Pt will achieve at least a 10% volume reductions bilaterally below the knees to return limb to more typical size and shape, to limit infection risk and LE progression, to decrease pain, to improve function, and to improve body image and QOL. Baseline: Dependent Goal status: INITIAL   5. Pt will achieve and sustain at least 85% attendance at OT sessions, and with compliance  with all LE self-care home program components throughout CDT, including modified simple self-MLD, daily skin care and inspection, lymphatic pumping the ex and appropriate compression to limit lymphedema progression and to limit further functional decline. Baseline: Dependent Goal status: INITIAL PLAN:  OT FREQUENCY: 2x/week and PRN  OT DURATION: other: 16 weeks and PRN due to bilaterality  PLANNED INTERVENTIONS: Complete Decongestive Therapy (CDT): Manual therapy (manual lymphatic drainage (MLD), therapeutic lymphatic pumping exercise, skin care to limit infection risk, compression ( wraps and garments, devices prn)[id number]-Therapeutic exercises, 97530- Therapeutic activity, 97535- Self Care, [id number]- Manual therapy, Patient/Family education, Manual lymph drainage, Compression bandaging, and DME instructions  PLAN FOR NEXT SESSION:  BLE comparative limb volumetrics Multilayer compression bandaging to one leg at a time. Start with knee length and build tolerance to thigh length. Pt. Family edu   Zebedee Dec, MS, OTR/L, [facility] 10/25/23 3:07 PM

## 2023-11-01 ENCOUNTER — Ambulatory Visit: Admitting: Occupational Therapy

## 2023-11-01 DIAGNOSIS — I890 Lymphedema, not elsewhere classified: Secondary | ICD-10-CM

## 2023-11-01 DIAGNOSIS — R609 Edema, unspecified: Secondary | ICD-10-CM | POA: Diagnosis not present

## 2023-11-01 NOTE — Therapy (Signed)
 OUTPATIENT OCCUPATIONAL THERAPY EVALUATION  BLE/BLQ  Lipo-Lymphedema   Patient Name: Norma Wall MRN: [id number] DOB:[birth year], 53 y.o., female Today's Date: 11/01/2023  END OF SESSION:  OT End of Session - 11/01/23 1500     Visit Number 4    Number of Visits 36    Date for Recertification  01/10/24    OT Start Time 0300    OT Stop Time 0400    OT Time Calculation (min) 60 min    Activity Tolerance Patient tolerated treatment well;No increased pain    Behavior During Therapy [facility] for tasks assessed/performed            Past Medical History:  Diagnosis Date   Abdominal pain, right upper quadrant 2013   Anxiety    Cancer (HCC)    basal cells- [place]   COVID-19 2021   Endometriosis 01/2013   Gallbladder polyp    Lipidemia 2020   Lymphedema 01/2019   Migraine headache    3-4x/yr   Ovarian cyst 2014   right ovary   PONV (postoperative nausea and vomiting)    Vertigo    resolves quickly   Past Surgical History:  Procedure Laterality Date   ABLATION  2012   cyst removal   CHOLECYSTECTOMY N/A 10/19/2018   Procedure: LAPAROSCOPIC CHOLECYSTECTOMY;  Surgeon: Jordis Laneta FALCON, MD;  Location: [facility];  Service: General;  Laterality: N/A;   COLONOSCOPY WITH PROPOFOL  N/A 07/21/2020   Procedure: COLONOSCOPY WITH PROPOFOL ;  Surgeon: Jinny Carmine, MD;  Location: [facility];  Service: Endoscopy;  Laterality: N/A;   CYSTOSCOPY N/A 09/25/2020   Procedure: CYSTOSCOPY;  Surgeon: Lake Read, MD;  Location: [facility];  Service: Gynecology;  Laterality: N/A;   CYSTOSCOPY W/ URETERAL STENT PLACEMENT Right 09/25/2020   Procedure: CYSTOSCOPY WITH RETROGRADE PYELOGRAM/URETERAL STENT PLACEMENT;  Surgeon: Francisca Redell BROCKS, MD;  Location: [facility];  Service: Urology;  Laterality: Right;   LAPAROSCOPIC OVARIAN CYSTECTOMY Right 10/19/2018   Procedure: LAPAROSCOPIC RIGHT OVARIAN CYSTECTOMY;  Surgeon: Lake Read, MD;  Location: [facility];  Service: Gynecology;   Laterality: Right;   NECK SURGERY  02/22/2018   Artificial Disc replacement    OVARIAN CYST REMOVAL     POLYPECTOMY N/A 07/21/2020   Procedure: POLYPECTOMY;  Surgeon: Jinny Carmine, MD;  Location: [facility];  Service: Endoscopy;  Laterality: N/A;   TOTAL LAPAROSCOPIC HYSTERECTOMY WITH SALPINGECTOMY Bilateral 09/25/2020   Procedure: TOTAL LAPAROSCOPIC HYSTERECTOMY WITH SALPINGECTOMY;  Surgeon: Lake Read, MD;  Location: [facility];  Service: Gynecology;  Laterality: Bilateral;   Patient Active Problem List   Diagnosis Date Noted   Hormone replacement therapy (HRT) 06/07/2023   Menopause 03/08/2023   Lower extremity numbness and tingling (Left) 02/24/2023   Lumbar radiculitis (Left) 02/24/2023   History of postoperative nausea and vomiting 11/25/2022   Lumbar facet joint hypertrophy (Multilevel) (Bilateral) 11/10/2022   Osteoarthritis of facet joint of lumbar spine 11/10/2022   Lumbosacral facet joint syndrome 11/10/2022   Osteoarthritis of lumbar spine 11/10/2022   Spondylosis without myelopathy or radiculopathy, lumbosacral region 11/10/2022   Lumbar lateral recess stenosis (Bilateral: L2-3, L3-4, L4-5) 11/10/2022   Lumbosacral foraminal stenosis (Bilateral: L3-4, L4-5) 11/10/2022   Ligamentum flavum hypertrophy (L4-5) 11/10/2022   Osteoarthritis of hip (Right) 11/10/2022   Enthesopathy of hip region (Right) 11/10/2022   Hamstring tendinitis of thigh (Right) 11/10/2022   Iliopsoas bursitis of hip (Left) 11/10/2022   Cervical disc protrusion at C5-6 (Left) 11/10/2022   Cervical disc herniation/extrusion at C6-7 level  (Right) 11/10/2022  Cervical C8 nerve root impingement (Right) 11/10/2022   Cervical lateral recess stenosis (Left: C5-6) (Right:C5-6) 11/10/2022   DDD (degenerative disc disease), cervical 11/10/2022   DDD (degenerative disc disease), lumbar 11/10/2022   Chronic hip pain (1ry area of Pain) (Right) 10/25/2022   Iliopsoas bursitis of hip (Right) 10/25/2022    Groin pain (Right) 10/25/2022   Chronic low back pain (Bilateral) w/o sciatica 10/25/2022   Chronic low back pain (2ry area of Pain) (Midline) w/o sciatica 10/25/2022   Lumbar facet joint pain 10/25/2022   Grade 1 Retrolisthesis of Lumbar region (L2/L3, L3/L4, L4/L5) 10/25/2022   Abnormal MRI, Hip (Right) (09/04/2022) 10/24/2022   Abnormal MRI, lumbar spine (09/03/2022) 10/24/2022   Chronic pain syndrome 10/24/2022   Pharmacologic therapy 10/24/2022   Disorder of skeletal system 10/24/2022   Problems influencing health status 10/24/2022   Abnormal MRI, cervical spine (01/18/2018) 10/24/2022   Arthralgia of right hip 06/29/2022   Multilevel lumbosacral spondylosis with radiculopathy 06/29/2022   Low libido 05/13/2022   Hot flashes 05/13/2022   Hematuria 03/01/2022   Left lower quadrant abdominal pain 03/01/2022   Right upper quadrant abdominal pain 03/01/2022   S/P laparoscopic hysterectomy 09/25/2020   Pelvic pain    Polyp of ascending colon    Lymphedema 01/20/2019   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 Always felt I were larger than my friends. With both of my pregnancies I had a lot of swelling in my legs ( 2000 and 2006). Also remember in 2010 noticing how swollen my ankles were after work.  SUBJECTIVE:   SUBJECTIVE STATEMENT: Pt presents for OT visit to address BLE Lipo-lymphedema. Pt reports no new concerns.the patient had no  problems after MLD last session. Pt reports BLE pain as 1-2/10. It's hard tease out sometimes what's causing it.   (OT INITIAL EVAL: Ally Knodel is referred to Occupational Therapy by Jon Eva, MD, for evaluation and treatment of BLE lymphedema. Pt relays that she requested referral so she could learn more about her diagnosis and explore treatment options.  Pt reports,  When growing up I always felt like I had big legs and feet. During both pregnancies her legs were abnormally swollen, and they remained swollen afterwards. Pt endorses family hx of chronic leg swelling in her mother and maternal grandmother. Pt reports she has not previously undergone formal lymphedema treatment in the past, but she tries to follow conservative measures, including leg elevation her.  She occasionally wears knee length compression stockings, but has never found any that are comfortable and that fit.  They roll down and they hurt. She reports she is diagnoses with CVI by Earma Johnita Birch, MD,  at the [facility] in [place], [place]. Pt reports she has undergone several vein ablations. She reports she also uses a compression pump regularly, but she is unsure of the manufacturer. Pt's goals for OT are,  to L learn about MLD and get comfortable compression that works.   PERTINENT HISTORY: CVI ( multiple vein ablations at chronic hip  and back pain 2/2 multiple complex problems including multiple level DJD, OA,  Multilevel lumbosacral spondylosis with radiculopathy, tendonitis, bursitis, obesity (30-39.9), Lipedema  PAIN:  Are you having pain? Yes: NPRS scale: 2-3/10 on average Pain location: full limb and worse in thighs Pain description: tenderness, dull deep ache, almost like a bone ache, hypersensitive to touch Aggravating factors: worse at rest, at night it's more, climbing stairs and inclines Relieving factors: conservative therapies- pump, epsom salts baths, vibration plate,  elevation  PRECAUTIONS: Other: Lymphedema  FALLS:  Has patient fallen in last 6 months? No  LIVING ENVIRONMENT: Lives with:  spouse, 68 yo son ans our dog Lives in: House/apartment Stairs: Yes a few to enter; Internal: 1 flight steps; on right going up and   Has following equipment at home: None  OCCUPATION: office manager   LEISURE: reading, crafts, photography, kyacking, fishing  HAND DOMINANCE: right   PRIOR LEVEL OF FUNCTION: Independent  PATIENT GOALS: Learn about MLD; get comfortable compression that works  OBJECTIVE: Note: Objective measures were completed at Evaluation unless otherwise noted.  COGNITION:  Overall cognitive status: Within functional limits for tasks assessed   OBSERVATIONS / OTHER ASSESSMENTS:   POSTURE: WNL  LE ROM: WFL  STRENGTH: WFL  Mild, Stage  II, Bilateral Lower Extremity Lymphedema 2/2 CVI and Obesity  Skin  Description Hyper-Keratosis Peau d' Orange Shiny Tight Fibrotic/ Indurated Fatty Doughy Spongy/ boggy   x    Fatty fibrosis, nodular fat x x x   Skin dry Flaky WNL Macerated     x    Color Redness Varicosities Blanching Hemosiderin Stain Mottled    x    x   Odor Malodorous Yeast Fungal infection  WNL      x   Temperature Warm Cool wnl    x     Pitting Edema   1+ 2+ 3+ 4+ Non-pitting         x   Girth Symmetrical Asymmetrical                   Distribution   X Feet spared  toes to groin    Stemmer Sign Positive Negative    Feet spared   Lymphorrhea History Of:  Present Absent     x    Wounds History Of Present Absent Venous Arterial Pressure Sheer     x        Signs of Infection Redness Warmth Erythema Acute Swelling Drainage Borders                    Sensation Light Touch Deep pressure Hypersensitivity   In Tact Impaired In Tact Impaired Absent Impaired   x Tactile  x   x    Nails WNL   Fungus nail dystrophy   x     Hair Growth Symmetrical Asymmetrical   x    Skin Creases Base of  toes  Ankles   Base of Fingers knees       Abdominal pannus Thigh Lobules  Face/neck    X Pantaloon deformity  x       BLE COMPARATIVE LIMB VOLUMETRICS TBA OT Rx 1  LANDMARK RIGHT    R LEG (A-D) 7728.4 ml  R THIGH (E-G) 9757.0 ml  R FULL LIMB (A-G) 17485.3 ml  Limb Volume differential (LVD)  R Leg LVD= 3.6%, R>L, R THIGH LVD = 4.9, R>L, Full Limb LVD= 4.3%, R>L  Volume change since initial %  Volume change overall V  (  Blank rows = not tested)  LANDMARK LEFT  09/23/21  L LEG (A-D) 7447.0 ml  L THIGH (E-G) 9276.3 ml  L  FULL LIMB (A-G) 16725.2 ml  Limb Volume differential (LVD)  %  Volume change since initial %  Volume change overall %  (Blank rows = not tested)     LYMPHEDEMA ASSESSMENTS:   SURGERY TYPE/DATE: N/A non cancer related  INFECTIONS: denies hx cellulitis  WOUNDS: denies  INJURIES/TRAUMA: significant L achilles injury as a chid  LYMPHEDEMA ASSESSMENTS:   GAIT: Distance walked: >500' Assistive device utilized: None Level of assistance: Complete Independence Comments: WFL  LYMPHEDEMA LIFE IMPACT SCALE (LLIS):   Initial 10/11/23 60.29% (The extent of which lymphedema-related problems impacted your life in the last week)                                                                                                                       TREATMENT THIS DATE: RLE/RLQ MLD Pt/ family edu   PATIENT EDUCATION:  Continued Pt/ CG edu for lymphedema self care home program throughout session. Topics include outcome of comparative limb volumetrics- starting limb volume differentials (LVDs), technology and gradient techniques used for short stretch, multilayer compression wrapping, simple self-MLD, therapeutic lymphatic pumping exercises, skin/nail care, LE precautions, compression garment recommendations and specifications, wear and care schedule and compression garment donning / doffing w assistive devices. Discussed progress towards all OT goals since commencing  CDT. Discussed detrimental impact of obesity on lower and upper extremity lymphedema over time. Reviewed OT goals for lymphedema care with Pt and discussed progress to date.  All questions answered to the Pt's satisfaction. Good return. Person educated: Patient  Education method: Explanation, Demonstration, and Handouts Education comprehension: verbalized understanding, returned demonstration, verbal cues required, and needs further education   LYMPHEDEMA SELF-CARE HOME PROGRAM:  BLE lymphatic pumping there ex- 1 set of 10 each element, in order. Hold 5. 2 x daily 2. Daily, short stretch, thigh length, multilayer compression bandages during Intensive Phase CDT- one LE at a time 3. During Self-management Phase:   Custom-made gradient compression garments and HOS devices are medically necessary because they are uniquely sized and shaped to fit the exact dimensions of the affected extremities, and to provide appropriate medical grade, graduated compression essential for optimally managing chronic, progressive lymphedema. Multiple custom compression garments are needed to ensure proper hygiene to limit infection risk. Custom compression garments should be replaced q 3-6 months When worn consistently for optimal lipo-lymphedema self-management over time. HOS devices, medically necessary to limit fibrosis buildup in tissue, should be replaced q 2 years and PRN when worn out.   4. Daily skin care with low ph lotion matching skin ph 5. Daily use of advanced, sequential , pneumatic compression device (Flexitouch) combined with self-MLD     ASSESSMENT:  CLINICAL IMPRESSION: Continued  RLE/RLQ MLD and provided Pt edu for simple self MLD throughout session. Pt tolerated manual therapy and asked excellent questions. . Pt not sure she will tolerate compression  wraps to R leg. Provided Pt edu re compression garments, flat vs circular knit, containment vs compression, flat vs circular knit. Educated Pt re proper  fit and function, importance of comfort, tolerance, independence with donning and doffing. Provided several samples to explore. Next session we'll check anatomical measurements to see if Pt will fit into off the shelf Juzo Sensation pantyhose. During interval OT will check cosy and submit demographics to see if Pt is in network. Cont as per POC.   Nicki Furlan is a 53 yo female presenting with moderate, stage II, BLE, lipo-lymphedema 2/2 primary, stage 2, type 3 ( buttocks, abdomen and ankles), lipedema. Lipidema is a primary disorder characterized by the symmetrical fatty enlargement of bilateral legs, buttocks, abdomen, and sometimes the arms, due to excessive fat deposition, or storage, in the lower quadrants. The upper half of the body is unaffected, and despite recent weight loss, the lower half of the body is disproportionately larger than the upper half. As lipedema progresses fibrotic, nodular fat accumulates in the subcutis putting pressure on delicate lymphatics, which in turn leads to excessive, high protein lymph accumulation, resulting in chronic, progressive lymphedema. The combined condition of lipo-lymphedema is difficult to treat as lipedema does not typically respond well to Complete Decongestive Therapy (CDT). The lymphedema side of the condition may respond to varying degrees. Well fitting, comfortable compression garments that do not constrict or roll down often provide pain relief and enable improved functional performance. An advanced, sequential, pneumatic, compression device, the [employer] Flexitouch device is recommended because it addresses the affected abdomen and buttocks, proximal to the regional, inguinal lymph nodes. The advanced Flexitouch device is known reduce pain and lymphatic congestion, as well as infection risk in those with lipo-lymphedema. If Pt is using a basic vaso-pneumatic pump with squeeze and release function, it should be replaced with an advanced  sequential pneumatic compression device for optimal, long term, daily, lipo-lymphedema self-management over time.    Ms Jacquez will benefit from skilled Occupational Therapy to reduce limb swelling and associated pain, to limit progression and infection risk associated with chronic, progressive Lymphedema, and to learn lymphedema self management protocol for long term self management.   SABRA BLE lipo- lymphedema limits functional performance in all occupational domains, including functional mobility and ambulation, basic and instrumental ADLs, productive activities, leisure pursuits, social participation and quality of life. This patient will benefit from modified Intensive and Self-management Phase CDT . In addition to MLD, therex, skin care and compression bandaging below the knees, Pt will be fitted with custom knee length compression stockings:   OBJECTIVE IMPAIRMENTS: decreased knowledge of condition, decreased knowledge of use of DME, decreased mobility, decreased ROM, increased edema, impaired flexibility, impaired sensation, obesity, pain, and chronic, progressive, painful BLE/BLQ swelling.   ACTIVITY LIMITATIONS:  Limited Functional ambulation and mobility (dependent sitting, standing, walking, squatting, stairs, transfers, bed mobility) Impaired basic and instrumental ADLs: sleeping, grooming toe nails, inspecting skin and performing LE/LLQ skin care, LB bathing, LB dressing, fitting shoes,  Impaired social participation:  when extended standing, walking and sitting are necessary, such as when shopping, going to the movies, attending community and church functions Impaired leisure pursuits when extended sitting, standing , walking required Quality of Life (Psychosocial concerns ):   PERSONAL FACTORS: Time since onset of injury/illness/exacerbation and 3+ comorbidities: primary lipidema, obesity, chronic pain are also affecting patient's functional outcome.   REHAB POTENTIAL: Good  EVALUATION  COMPLEXITY: Moderate   GOALS:Goals reviewed with patient? Yes   SHORT TERM GOALS:  Target date: 4th OT Rx visit    Pt will demonstrate understanding of lymphedema precautions and prevention strategies with modified independence using a printed reference to identify at least 5 precautions and discussing how s/he may implement them into daily life to reduce risk of progression with modified assistance Baseline: max a Goal status: INITIAL   2.  With modified independence (extra time)  Pt will be able to apply multilayer, thigh length, compression wraps using gradient techniques  to decrease limb volume, to limit infection risk, and to limit lymphedema progression.  Given this patient's Intake score of tbd % on the  Baseline: Dependent Goal status: INITIAL     LONG TERM GOALS: Target date: 01/09/24(12 WEEKS)     Given this patient's Intake score of 60.29% on the Lymphedema Life Impact Scale (LLIS), patient will experience a reduction of at least 20 points in the extent to which lymphedema-elated problems impact her life on a weekly basis to her perceived level to improve functional performance and quality of life (QOL). Baseline: max a Goal status: INITIAL   2.  With modified independence (extra time and assistive devices) Pt will be able to don and doff appropriate compression garments and/or devices to control BLE lymphedema and to limit progression.  Baseline: Dependent Goal status: INITIAL  3. Pt  will be able to perform each lymphedema self-care home program component ( simple self MLD, lymphatic pumping ther ex, skin care, compression therapy) using correct techniques using printed work book as Education administrator by DC for optimal LE self management over time to limit progression.   4. Pt will achieve at least a 10% volume reductions bilaterally below the knees to return limb to more typical size and shape, to limit infection risk and LE progression, to decrease pain, to improve function, and to  improve body image and QOL. Baseline: Dependent Goal status: INITIAL   5. Pt will achieve and sustain at least 85% attendance at OT sessions, and with compliance with all LE self-care home program components throughout CDT, including modified simple self-MLD, daily skin care and inspection, lymphatic pumping the ex and appropriate compression to limit lymphedema progression and to limit further functional decline. Baseline: Dependent Goal status: INITIAL PLAN:  OT FREQUENCY: 2x/week and PRN  OT DURATION: other: 16 weeks and PRN due to bilaterality  PLANNED INTERVENTIONS: Complete Decongestive Therapy (CDT): Manual therapy (manual lymphatic drainage (MLD), therapeutic lymphatic pumping exercise, skin care to limit infection risk, compression ( wraps and garments, devices prn)[id number]-Therapeutic exercises, 97530- Therapeutic activity, 97535- Self Care, [id number]- Manual therapy, Patient/Family education, Manual lymph drainage, Compression bandaging, and DME instructions  PLAN FOR NEXT SESSION:  BLE comparative limb volumetrics Multilayer compression bandaging to one leg at a time. Start with knee length and build tolerance to thigh length. Pt. Family edu   Zebedee Dec, MS, OTR/L, CLT-LANA 11/01/23 4:01 PM

## 2023-11-03 ENCOUNTER — Encounter: Admitting: Occupational Therapy

## 2023-11-07 ENCOUNTER — Ambulatory Visit
Admission: RE | Admit: 2023-11-07 | Discharge: 2023-11-07 | Disposition: A | Discharge status: Home / Self Care | Source: Ambulatory Visit | Attending: Family Medicine | Admitting: Family Medicine

## 2023-11-07 DIAGNOSIS — Z1231 Encounter for screening mammogram for malignant neoplasm of breast: Secondary | ICD-10-CM

## 2023-11-08 ENCOUNTER — Ambulatory Visit: Attending: Family Medicine | Admitting: Occupational Therapy

## 2023-11-08 DIAGNOSIS — I890 Lymphedema, not elsewhere classified: Secondary | ICD-10-CM | POA: Diagnosis not present

## 2023-11-08 NOTE — Therapy (Signed)
 OUTPATIENT OCCUPATIONAL THERAPY EVALUATION  BLE/BLQ  Lipo-Lymphedema   Patient Name: Norma Wall MRN: [id number] DOB:May 06, 1970, 53 y.o., female Today's Date: 11/08/2023  END OF SESSION:  OT End of Session - 11/08/23 0911     Visit Number 5    Number of Visits 36    Date for Recertification  01/10/24    OT Start Time 0905    Activity Tolerance Patient tolerated treatment well;No increased pain    Behavior During Therapy [facility] for tasks assessed/performed            Past Medical History:  Diagnosis Date   Abdominal pain, right upper quadrant 2013   Anxiety    Cancer (HCC)    basal cells- [place]   COVID-19 2021   Endometriosis 01/2013   Gallbladder polyp    Lipidemia 2020   Lymphedema 01/2019   Migraine headache    3-4x/yr   Ovarian cyst 2014   right ovary   PONV (postoperative nausea and vomiting)    Vertigo    resolves quickly   Past Surgical History:  Procedure Laterality Date   ABLATION  2012   cyst removal   CHOLECYSTECTOMY N/A 10/19/2018   Procedure: LAPAROSCOPIC CHOLECYSTECTOMY;  Surgeon: Jordis Laneta FALCON, MD;  Location: [facility];  Service: General;  Laterality: N/A;   COLONOSCOPY WITH PROPOFOL  N/A 07/21/2020   Procedure: COLONOSCOPY WITH PROPOFOL ;  Surgeon: Jinny Carmine, MD;  Location: [facility];  Service: Endoscopy;  Laterality: N/A;   CYSTOSCOPY N/A 09/25/2020   Procedure: CYSTOSCOPY;  Surgeon: Lake Read, MD;  Location: [facility];  Service: Gynecology;  Laterality: N/A;   CYSTOSCOPY W/ URETERAL STENT PLACEMENT Right 09/25/2020   Procedure: CYSTOSCOPY WITH RETROGRADE PYELOGRAM/URETERAL STENT PLACEMENT;  Surgeon: Francisca Redell BROCKS, MD;  Location: [facility];  Service: Urology;  Laterality: Right;   LAPAROSCOPIC OVARIAN CYSTECTOMY Right 10/19/2018   Procedure: LAPAROSCOPIC RIGHT OVARIAN CYSTECTOMY;  Surgeon: Lake Read, MD;  Location: [facility];  Service: Gynecology;  Laterality: Right;   NECK SURGERY  02/22/2018   Artificial Disc  replacement    OVARIAN CYST REMOVAL     POLYPECTOMY N/A 07/21/2020   Procedure: POLYPECTOMY;  Surgeon: Jinny Carmine, MD;  Location: [facility];  Service: Endoscopy;  Laterality: N/A;   TOTAL LAPAROSCOPIC HYSTERECTOMY WITH SALPINGECTOMY Bilateral 09/25/2020   Procedure: TOTAL LAPAROSCOPIC HYSTERECTOMY WITH SALPINGECTOMY;  Surgeon: Lake Read, MD;  Location: [facility];  Service: Gynecology;  Laterality: Bilateral;   Patient Active Problem List   Diagnosis Date Noted   Hormone replacement therapy (HRT) 06/07/2023   Menopause 03/08/2023   Lower extremity numbness and tingling (Left) 02/24/2023   Lumbar radiculitis (Left) 02/24/2023   History of postoperative nausea and vomiting 11/25/2022   Lumbar facet joint hypertrophy (Multilevel) (Bilateral) 11/10/2022   Osteoarthritis of facet joint of lumbar spine 11/10/2022   Lumbosacral facet joint syndrome 11/10/2022   Osteoarthritis of lumbar spine 11/10/2022   Spondylosis without myelopathy or radiculopathy, lumbosacral region 11/10/2022   Lumbar lateral recess stenosis (Bilateral: L2-3, L3-4, L4-5) 11/10/2022   Lumbosacral foraminal stenosis (Bilateral: L3-4, L4-5) 11/10/2022   Ligamentum flavum hypertrophy (L4-5) 11/10/2022   Osteoarthritis of hip (Right) 11/10/2022   Enthesopathy of hip region (Right) 11/10/2022   Hamstring tendinitis of thigh (Right) 11/10/2022   Iliopsoas bursitis of hip (Left) 11/10/2022   Cervical disc protrusion at C5-6 (Left) 11/10/2022   Cervical disc herniation/extrusion at C6-7 level  (Right) 11/10/2022   Cervical C8 nerve root impingement (Right) 11/10/2022   Cervical lateral recess stenosis (Left: C5-6) (  Right:C5-6) 11/10/2022   DDD (degenerative disc disease), cervical 11/10/2022   DDD (degenerative disc disease), lumbar 11/10/2022   Chronic hip pain (1ry area of Pain) (Right) 10/25/2022   Iliopsoas bursitis of hip (Right) 10/25/2022   Groin pain (Right) 10/25/2022   Chronic low back pain  (Bilateral) w/o sciatica 10/25/2022   Chronic low back pain (2ry area of Pain) (Midline) w/o sciatica 10/25/2022   Lumbar facet joint pain 10/25/2022   Grade 1 Retrolisthesis of Lumbar region (L2/L3, L3/L4, L4/L5) 10/25/2022   Abnormal MRI, Hip (Right) (09/04/2022) 10/24/2022   Abnormal MRI, lumbar spine (09/03/2022) 10/24/2022   Chronic pain syndrome 10/24/2022   Pharmacologic therapy 10/24/2022   Disorder of skeletal system 10/24/2022   Problems influencing health status 10/24/2022   Abnormal MRI, cervical spine (01/18/2018) 10/24/2022   Arthralgia of right hip 06/29/2022   Multilevel lumbosacral spondylosis with radiculopathy 06/29/2022   Low libido 05/13/2022   Hot flashes 05/13/2022   Hematuria 03/01/2022   Left lower quadrant abdominal pain 03/01/2022   Right upper quadrant abdominal pain 03/01/2022   S/P laparoscopic hysterectomy 09/25/2020   Pelvic pain    Polyp of ascending colon    Lymphedema 01/20/2019   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 Always felt I were larger than my friends. With both of my pregnancies I had a lot of swelling in my legs ( 2000 and 2006). Also remember in 2010 noticing how swollen my ankles were after work.  SUBJECTIVE:   SUBJECTIVE STATEMENT: Pt presents for OT visit to address BLE Lipo-lymphedema. Pt reports no new concerns. The patient had no problems after MLD last session. Pt reports BLE pain as 1-2/10.  It's hard tease out sometimes what's causing it.   (OT INITIAL EVAL: Norma Wall is referred to Occupational Therapy by Jon Eva, MD, for evaluation and treatment of BLE lymphedema. Pt relays that she requested referral so she could learn more about her diagnosis and explore treatment options.  Pt reports,  When growing up I always felt like I had big legs and feet. During both pregnancies her legs were abnormally swollen, and they remained swollen afterwards. Pt endorses family hx of chronic leg swelling in her mother and maternal grandmother. Pt reports she has not previously undergone formal lymphedema treatment in the past, but she tries to follow conservative measures, including leg elevation her.  She occasionally wears knee length compression stockings, but has never found any that are comfortable and that fit.  They roll down and they hurt. She reports she is diagnoses with CVI by Earma Johnita Birch, MD,  at the [facility] in [place], [place]. Pt reports she has undergone several vein ablations. She reports she also uses a compression pump regularly, but she is unsure of the manufacturer. Pt's goals for OT are,  to L learn about MLD and get comfortable compression that works.   PERTINENT HISTORY: CVI ( multiple vein ablations at chronic hip and back pain 2/2 multiple complex problems including multiple level DJD, OA,  Multilevel  lumbosacral spondylosis with radiculopathy, tendonitis, bursitis, obesity (30-39.9), Lipedema  PAIN:  Are you having pain? Yes: NPRS scale: 7/10 on average Pain location: full limb and worse in thighs Pain description: tenderness, dull deep ache, almost like a bone ache, hypersensitive to touch Aggravating factors: worse at rest, at night it's more, climbing stairs and inclines Relieving factors: conservative therapies- pump, epsom salts baths, vibration plate, elevation  PRECAUTIONS: Other: Lymphedema  FALLS:  Has patient fallen in  last 6 months? No  LIVING ENVIRONMENT: Lives with:  spouse, 23 yo son ans our dog Lives in: House/apartment Stairs: Yes a few to enter; Internal: 1 flight steps; on right going up and   Has following equipment at home: None  OCCUPATION: office manager   LEISURE: reading, crafts, photography, kyacking, fishing  HAND DOMINANCE: right   PRIOR LEVEL OF FUNCTION: Independent  PATIENT GOALS: Learn about MLD; get comfortable compression that works  OBJECTIVE: Note: Objective measures were completed at Evaluation unless otherwise noted.  COGNITION:  Overall cognitive status: Within functional limits for tasks assessed   OBSERVATIONS / OTHER ASSESSMENTS:   POSTURE: WNL  LE ROM: WFL  STRENGTH: WFL  Mild, Stage  II, Bilateral Lower Extremity Lymphedema 2/2 CVI and Obesity  Skin  Description Hyper-Keratosis Peau d' Orange Shiny Tight Fibrotic/ Indurated Fatty Doughy Spongy/ boggy   x    Fatty fibrosis, nodular fat x x x   Skin dry Flaky WNL Macerated     x    Color Redness Varicosities Blanching Hemosiderin Stain Mottled    x    x   Odor Malodorous Yeast Fungal infection  WNL      x   Temperature Warm Cool wnl    x     Pitting Edema   1+ 2+ 3+ 4+ Non-pitting         x   Girth Symmetrical Asymmetrical                   Distribution   X Feet spared  toes to groin    Stemmer Sign Positive Negative    Feet spared   Lymphorrhea History Of:  Present Absent     x    Wounds History Of Present Absent Venous Arterial Pressure Sheer     x        Signs of Infection Redness Warmth Erythema Acute Swelling Drainage Borders                    Sensation Light Touch Deep pressure Hypersensitivity   In Tact Impaired In Tact Impaired Absent Impaired   x Tactile  x   x    Nails WNL   Fungus nail dystrophy   x     Hair Growth Symmetrical Asymmetrical   x    Skin Creases Base of toes  Ankles   Base of Fingers knees       Abdominal pannus Thigh Lobules   Face/neck    X Pantaloon deformity  x       BLE COMPARATIVE LIMB VOLUMETRICS TBA OT Rx 1  LANDMARK RIGHT    R LEG (A-D) 7728.4 ml  R THIGH (E-G) 9757.0 ml  R FULL LIMB (A-G) 17485.3 ml  Limb Volume differential (LVD)  R Leg LVD= 3.6%, R>L, R THIGH LVD = 4.9, R>L, Full Limb LVD= 4.3%, R>L  Volume change since initial %  Volume change overall V  (Blank rows = not tested)  LANDMARK LEFT  09/23/21  L LEG (A-D)  7447.0 ml  L THIGH (E-G) 9276.3 ml  L  FULL LIMB (A-G) 16725.2 ml  Limb Volume differential (LVD)  %  Volume change since initial %  Volume change overall %  (Blank rows = not tested)     LYMPHEDEMA ASSESSMENTS:   SURGERY TYPE/DATE: N/A non cancer related  INFECTIONS: denies hx cellulitis  WOUNDS: denies  INJURIES/TRAUMA: significant L achilles injury as a chid  LYMPHEDEMA ASSESSMENTS:   GAIT: Distance walked: >500' Assistive device utilized: None Level of assistance: Complete Independence Comments: WFL  LYMPHEDEMA LIFE IMPACT SCALE (LLIS):   Initial 10/11/23 60.29% (The extent of which lymphedema-related problems impacted your life in the last week)                                                                                                                       TREATMENT THIS DATE: RLE/RLQ MLD Pt/ family edu Multilayer gradient compression bandages to LLE , ankles ti groin.   PATIENT EDUCATION:  Continued Pt/ CG edu for lymphedema self care home program throughout session. Topics include outcome of comparative limb volumetrics- starting limb volume differentials (LVDs), technology and gradient techniques used for short stretch, multilayer compression wrapping, simple self-MLD, therapeutic lymphatic pumping exercises, skin/nail care, LE precautions, compression garment recommendations and specifications, wear and care schedule and compression garment donning / doffing w assistive devices. Discussed progress towards all OT goals since commencing CDT.  Discussed detrimental impact of obesity on lower and upper extremity lymphedema over time. Reviewed OT goals for lymphedema care with Pt and discussed progress to date.  All questions answered to the Pt's satisfaction. Good return. Person educated: Patient  Education method: Explanation, Demonstration, and Handouts Education comprehension: verbalized understanding, returned demonstration, verbal cues required, and needs further education   LYMPHEDEMA SELF-CARE HOME PROGRAM:  BLE lymphatic pumping there ex- 1 set of 10 each element, in order. Hold 5. 2 x daily 2. Daily, short stretch, thigh length, multilayer compression bandages during Intensive Phase CDT- one LE at a time 3. During Self-management Phase:   Custom-made gradient compression garments and HOS devices are medically necessary because they are uniquely sized and shaped to fit the exact dimensions of the affected extremities, and to provide appropriate medical grade, graduated compression essential for optimally managing chronic, progressive lymphedema. Multiple custom compression garments are needed to ensure proper hygiene to limit infection risk. Custom compression garments should be replaced q 3-6 months When worn consistently for optimal lipo-lymphedema self-management over time. HOS devices, medically necessary to limit fibrosis buildup in tissue, should be replaced q 2 years and PRN when worn out.   4. Daily skin care with low ph lotion matching skin ph 5. Daily use of advanced, sequential , pneumatic compression device (Flexitouch) combined with self-MLD     ASSESSMENT:  CLINICAL IMPRESSION: Completed anatomical measurements and determined Pt should;d fit into an off the shelf Juzo Sensation pantyhose, size L Plus, REG length pantyhose. Requested benefits check and financial s sent to patient from DME vendor  for estimate of OOP cost. Provided abbreviated LLE MLD session while reviewing short vs long stretch compression and how it  works. Pt spontaneously performed neck sequence correctly without verbal cues. Provided Pt edu  re short vs long stretch compression and wrapped R leg  with gradient techniques. Pt willing to try wraps on trial basis for now. Assess tolerance of wraps and outcome next session. Cont as per POC.   Continued  RLE/RLQ MLD and provided Pt edu for simple self MLD throughout session. Pt tolerated manual therapy and asked excellent questions. . Pt not sure she will tolerate compression wraps to R leg. Provided Pt edu re compression garments, flat vs circular knit, containment vs compression, flat vs circular knit. Educated Pt re proper fit and function, importance of comfort, tolerance, independence with donning and doffing. Provided several samples to explore. Next session we'll check anatomical measurements to see if Pt will fit into off the shelf Juzo Sensation pantyhose. During interval OT will check cosy and submit demographics to see if Pt is in network. Cont as per POC.   Brynlee Pennywell is a 53 yo female presenting with moderate, stage II, BLE, lipo-lymphedema 2/2 primary, stage 2, type 3 ( buttocks, abdomen and ankles), lipedema. Lipidema is a primary disorder characterized by the symmetrical fatty enlargement of bilateral legs, buttocks, abdomen, and sometimes the arms, due to excessive fat deposition, or storage, in the lower quadrants. The upper half of the body is unaffected, and despite recent weight loss, the lower half of the body is disproportionately larger than the upper half. As lipedema progresses fibrotic, nodular fat accumulates in the subcutis putting pressure on delicate lymphatics, which in turn leads to excessive, high protein lymph accumulation, resulting in chronic, progressive lymphedema. The combined condition of lipo-lymphedema is difficult to treat as lipedema does not typically respond well to Complete Decongestive Therapy (CDT). The lymphedema side of the condition may respond to  varying degrees. Well fitting, comfortable compression garments that do not constrict or roll down often provide pain relief and enable improved functional performance. An advanced, sequential, pneumatic, compression device, the [employer] Flexitouch device is recommended because it addresses the affected abdomen and buttocks, proximal to the regional, inguinal lymph nodes. The advanced Flexitouch device is known reduce pain and lymphatic congestion, as well as infection risk in those with lipo-lymphedema. If Pt is using a basic vaso-pneumatic pump with squeeze and release function, it should be replaced with an advanced sequential pneumatic compression device for optimal, long term, daily, lipo-lymphedema self-management over time.    Ms Wahlstrom will benefit from skilled Occupational Therapy to reduce limb swelling and associated pain, to limit progression and infection risk associated with chronic, progressive Lymphedema, and to learn lymphedema self management protocol for long term self management.   SABRA BLE lipo- lymphedema limits functional performance in all occupational domains, including functional mobility and ambulation, basic and instrumental ADLs, productive activities, leisure pursuits, social participation and quality of life. This patient will benefit from modified Intensive and Self-management Phase CDT . In addition to MLD, therex, skin care and compression bandaging below the knees, Pt will be fitted with custom knee length compression stockings:   OBJECTIVE IMPAIRMENTS: decreased knowledge of condition, decreased knowledge of use of DME, decreased mobility, decreased ROM, increased edema, impaired flexibility, impaired sensation, obesity, pain, and chronic, progressive, painful BLE/BLQ swelling.   ACTIVITY LIMITATIONS:  Limited Functional ambulation and mobility (dependent sitting, standing, walking, squatting, stairs, transfers, bed mobility) Impaired basic and instrumental ADLs:  sleeping,  grooming toe nails, inspecting skin and performing LE/LLQ skin care, LB bathing, LB dressing, fitting shoes,  Impaired social participation:  when extended standing, walking and sitting are necessary, such as when shopping, going to the movies, attending community and church functions Impaired leisure pursuits when extended sitting, standing , walking required Quality of Life (Psychosocial concerns ):   PERSONAL FACTORS: Time since onset of injury/illness/exacerbation and 3+ comorbidities: primary lipidema, obesity, chronic pain are also affecting patient's functional outcome.   REHAB POTENTIAL: Good  EVALUATION COMPLEXITY: Moderate   GOALS:Goals reviewed with patient? Yes   SHORT TERM GOALS: Target date: 4th OT Rx visit    Pt will demonstrate understanding of lymphedema precautions and prevention strategies with modified independence using a printed reference to identify at least 5 precautions and discussing how s/he may implement them into daily life to reduce risk of progression with modified assistance Baseline: max a Goal status: INITIAL   2.  With modified independence (extra time)  Pt will be able to apply multilayer, thigh length, compression wraps using gradient techniques  to decrease limb volume, to limit infection risk, and to limit lymphedema progression.  Given this patient's Intake score of tbd % on the  Baseline: Dependent Goal status: INITIAL     LONG TERM GOALS: Target date: 01/09/24(12 WEEKS)     Given this patient's Intake score of 60.29% on the Lymphedema Life Impact Scale (LLIS), patient will experience a reduction of at least 20 points in the extent to which lymphedema-elated problems impact her life on a weekly basis to her perceived level to improve functional performance and quality of life (QOL). Baseline: max a Goal status: INITIAL   2.  With modified independence (extra time and assistive devices) Pt will be able to don and doff appropriate  compression garments and/or devices to control BLE lymphedema and to limit progression.  Baseline: Dependent Goal status: INITIAL  3. Pt  will be able to perform each lymphedema self-care home program component ( simple self MLD, lymphatic pumping ther ex, skin care, compression therapy) using correct techniques using printed work book as Education administrator by DC for optimal LE self management over time to limit progression.   4. Pt will achieve at least a 10% volume reductions bilaterally below the knees to return limb to more typical size and shape, to limit infection risk and LE progression, to decrease pain, to improve function, and to improve body image and QOL. Baseline: Dependent Goal status: INITIAL   5. Pt will achieve and sustain at least 85% attendance at OT sessions, and with compliance with all LE self-care home program components throughout CDT, including modified simple self-MLD, daily skin care and inspection, lymphatic pumping the ex and appropriate compression to limit lymphedema progression and to limit further functional decline. Baseline: Dependent Goal status: INITIAL PLAN:  OT FREQUENCY: 2x/week and PRN  OT DURATION: other: 16 weeks and PRN due to bilaterality  PLANNED INTERVENTIONS: Complete Decongestive Therapy (CDT): Manual therapy (manual lymphatic drainage (MLD), therapeutic lymphatic pumping exercise, skin care to limit infection risk, compression ( wraps and garments, devices prn)[id number]-Therapeutic exercises, 97530- Therapeutic activity, 97535- Self Care, [id number]- Manual therapy, Patient/Family education, Manual lymph drainage, Compression bandaging, and DME instructions  PLAN FOR NEXT SESSION:  BLE comparative limb volumetrics Multilayer compression bandaging to one leg at a time. Start with knee length and build tolerance to thigh length. Pt. Family edu   Zebedee Dec, MS, OTR/L, [facility] 11/08/23 9:13 AM

## 2023-11-10 ENCOUNTER — Ambulatory Visit: Payer: Self-pay | Admitting: Family Medicine

## 2023-11-15 ENCOUNTER — Ambulatory Visit: Admitting: Occupational Therapy

## 2023-11-15 DIAGNOSIS — I890 Lymphedema, not elsewhere classified: Secondary | ICD-10-CM

## 2023-11-16 NOTE — Therapy (Signed)
 OUTPATIENT OCCUPATIONAL THERAPY TREATMENT NOTE  BLE/BLQ  Lipo-Lymphedema   Patient Name: Timothea Bodenheimer MRN: [id number] DOB:1970/02/21, 53 y.o., female Today's Date: 11/16/2023  END OF SESSION:  OT End of Session - 11/15/23 1613     Visit Number 6    Number of Visits 36    Date for Recertification  01/10/24    OT Start Time 0305    OT Stop Time 0405    OT Time Calculation (min) 60 min    Activity Tolerance Patient tolerated treatment well;No increased pain    Behavior During Therapy [facility] for tasks assessed/performed            Past Medical History:  Diagnosis Date   Abdominal pain, right upper quadrant 2013   Anxiety    Cancer (HCC)    basal cells- [place]   COVID-19 2021   Endometriosis 01/2013   Gallbladder polyp    Lipidemia 2020   Lymphedema 01/2019   Migraine headache    3-4x/yr   Ovarian cyst 2014   right ovary   PONV (postoperative nausea and vomiting)    Vertigo    resolves quickly   Past Surgical History:  Procedure Laterality Date   ABLATION  2012   cyst removal   CHOLECYSTECTOMY N/A 10/19/2018   Procedure: LAPAROSCOPIC CHOLECYSTECTOMY;  Surgeon: Jordis Laneta FALCON, MD;  Location: [facility];  Service: General;  Laterality: N/A;   COLONOSCOPY WITH PROPOFOL  N/A 07/21/2020   Procedure: COLONOSCOPY WITH PROPOFOL ;  Surgeon: Jinny Carmine, MD;  Location: [facility];  Service: Endoscopy;  Laterality: N/A;   CYSTOSCOPY N/A 09/25/2020   Procedure: CYSTOSCOPY;  Surgeon: Lake Read, MD;  Location: [facility];  Service: Gynecology;  Laterality: N/A;   CYSTOSCOPY W/ URETERAL STENT PLACEMENT Right 09/25/2020   Procedure: CYSTOSCOPY WITH RETROGRADE PYELOGRAM/URETERAL STENT PLACEMENT;  Surgeon: Francisca Redell BROCKS, MD;  Location: [facility];  Service: Urology;  Laterality: Right;   LAPAROSCOPIC OVARIAN CYSTECTOMY Right 10/19/2018   Procedure: LAPAROSCOPIC RIGHT OVARIAN CYSTECTOMY;  Surgeon: Lake Read, MD;  Location: [facility];  Service: Gynecology;   Laterality: Right;   NECK SURGERY  02/22/2018   Artificial Disc replacement    OVARIAN CYST REMOVAL     POLYPECTOMY N/A 07/21/2020   Procedure: POLYPECTOMY;  Surgeon: Jinny Carmine, MD;  Location: [facility];  Service: Endoscopy;  Laterality: N/A;   TOTAL LAPAROSCOPIC HYSTERECTOMY WITH SALPINGECTOMY Bilateral 09/25/2020   Procedure: TOTAL LAPAROSCOPIC HYSTERECTOMY WITH SALPINGECTOMY;  Surgeon: Lake Read, MD;  Location: [facility];  Service: Gynecology;  Laterality: Bilateral;   Patient Active Problem List   Diagnosis Date Noted   Hormone replacement therapy (HRT) 06/07/2023   Menopause 03/08/2023   Lower extremity numbness and tingling (Left) 02/24/2023   Lumbar radiculitis (Left) 02/24/2023   History of postoperative nausea and vomiting 11/25/2022   Lumbar facet joint hypertrophy (Multilevel) (Bilateral) 11/10/2022   Osteoarthritis of facet joint of lumbar spine 11/10/2022   Lumbosacral facet joint syndrome 11/10/2022   Osteoarthritis of lumbar spine 11/10/2022   Spondylosis without myelopathy or radiculopathy, lumbosacral region 11/10/2022   Lumbar lateral recess stenosis (Bilateral: L2-3, L3-4, L4-5) 11/10/2022   Lumbosacral foraminal stenosis (Bilateral: L3-4, L4-5) 11/10/2022   Ligamentum flavum hypertrophy (L4-5) 11/10/2022   Osteoarthritis of hip (Right) 11/10/2022   Enthesopathy of hip region (Right) 11/10/2022   Hamstring tendinitis of thigh (Right) 11/10/2022   Iliopsoas bursitis of hip (Left) 11/10/2022   Cervical disc protrusion at C5-6 (Left) 11/10/2022   Cervical disc herniation/extrusion at C6-7 level  (Right) 11/10/2022  Cervical C8 nerve root impingement (Right) 11/10/2022   Cervical lateral recess stenosis (Left: C5-6) (Right:C5-6) 11/10/2022   DDD (degenerative disc disease), cervical 11/10/2022   DDD (degenerative disc disease), lumbar 11/10/2022   Chronic hip pain (1ry area of Pain) (Right) 10/25/2022   Iliopsoas bursitis of hip (Right) 10/25/2022    Groin pain (Right) 10/25/2022   Chronic low back pain (Bilateral) w/o sciatica 10/25/2022   Chronic low back pain (2ry area of Pain) (Midline) w/o sciatica 10/25/2022   Lumbar facet joint pain 10/25/2022   Grade 1 Retrolisthesis of Lumbar region (L2/L3, L3/L4, L4/L5) 10/25/2022   Abnormal MRI, Hip (Right) (09/04/2022) 10/24/2022   Abnormal MRI, lumbar spine (09/03/2022) 10/24/2022   Chronic pain syndrome 10/24/2022   Pharmacologic therapy 10/24/2022   Disorder of skeletal system 10/24/2022   Problems influencing health status 10/24/2022   Abnormal MRI, cervical spine (01/18/2018) 10/24/2022   Arthralgia of right hip 06/29/2022   Multilevel lumbosacral spondylosis with radiculopathy 06/29/2022   Low libido 05/13/2022   Hot flashes 05/13/2022   Hematuria 03/01/2022   Left lower quadrant abdominal pain 03/01/2022   Right upper quadrant abdominal pain 03/01/2022   S/P laparoscopic hysterectomy 09/25/2020   Pelvic pain    Polyp of ascending colon    Lymphedema 01/20/2019   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 Always felt I were larger than my friends. With both of my pregnancies I had a lot of swelling in my legs ( 2000 and 2006). Also remember in 2010 noticing how swollen my ankles were after work.  SUBJECTIVE:   SUBJECTIVE STATEMENT: Pt presents for OT treatment visit to address BLE Lipo-lymphedema. Pt reports no new concerns. The patient had  no problems after MLD last session. Pt reports BLE pain as 1-2/10. It's hard tease out sometimes what's causing it.   (OT INITIAL EVAL: Evelia Waskey is referred to Occupational Therapy by Jon Eva, MD, for evaluation and treatment of BLE lymphedema. Pt relays that she requested referral so she could learn more about her diagnosis and explore treatment options.  Pt reports,  When growing up I always felt like I had big legs and feet. During both pregnancies her legs were abnormally swollen, and they remained swollen afterwards. Pt endorses family hx of chronic leg swelling in her mother and maternal grandmother. Pt reports she has not previously undergone formal lymphedema treatment in the past, but she tries to follow conservative measures, including leg elevation her.  She occasionally wears knee length compression stockings, but has never found any that are comfortable and that fit.  They roll down and they hurt. She reports she is diagnoses with CVI by Earma Johnita Birch, MD,  at the [facility] in [place], [place]. Pt reports she has undergone several vein ablations. She reports she also uses a compression pump regularly, but she is unsure of the manufacturer. Pt's goals for OT are,  to L learn about MLD and get comfortable compression that works.   PERTINENT HISTORY: CVI ( multiple vein ablations at  chronic hip and back pain 2/2 multiple complex problems including multiple level DJD, OA,  Multilevel lumbosacral spondylosis with radiculopathy, tendonitis, bursitis, obesity (30-39.9), Lipedema  PAIN:  Are you having pain? Yes: NPRS scale: 1-2/10 on average Pain location: full limb and worse in thighs Pain description: tenderness, dull deep ache, almost like a bone ache, hypersensitive to touch Aggravating factors: worse at rest, at night it's more, climbing stairs and inclines Relieving factors: conservative therapies- pump, epsom salts baths, vibration plate,  elevation  PRECAUTIONS: Other: Lymphedema  FALLS:  Has patient fallen in last 6 months? No  LIVING ENVIRONMENT: Lives with:  spouse, 36 yo son and dog Lives in: House/apartment Stairs: Yes a few to enter; Internal: 1 flight steps; on right going up and   Has following equipment at home: None  OCCUPATION: office manager   LEISURE: reading, crafts, photography, kyacking, fishing  HAND DOMINANCE: right   PRIOR LEVEL OF FUNCTION: Independent  PATIENT GOALS: Learn about MLD; get comfortable compression that works  OBJECTIVE: Note: Objective measures were completed at Evaluation unless otherwise noted.  COGNITION:  Overall cognitive status: Within functional limits for tasks assessed   OBSERVATIONS / OTHER ASSESSMENTS:   POSTURE: WNL  LE ROM: WFL  STRENGTH: WFL  Mild, Stage  II, Bilateral Lower Extremity Lymphedema 2/2 Lipedema and CVI   Skin  Description Hyper-Keratosis Peau d' Orange Shiny Tight Fibrotic/ Indurated Fatty Doughy Spongy/ boggy   x   x Fatty fibrosis, nodular fat x x x   Skin dry Flaky WNL Macerated     x    Color Redness Varicosities Blanching Hemosiderin Stain Mottled    x    x   Odor Malodorous Yeast Fungal infection  WNL      x   Temperature Warm Cool wnl    x     Pitting Edema   1+ 2+ 3+ 4+ Non-pitting         x   Girth Symmetrical Asymmetrical                   Distribution   X Feet spared  toes to abdomen, hips and buttocks    Stemmer Sign Positive Negative    Feet spared   Lymphorrhea History Of:  Present Absent     x    Wounds History Of Present Absent Venous Arterial Pressure Sheer     x        Signs of Infection Redness Warmth Erythema Acute Swelling Drainage Borders                    Sensation Light Touch Deep pressure Hypersensitivity   In Tact Impaired In Tact Impaired Absent Impaired   x  x   x    Nails WNL   Fungus nail dystrophy   x     Hair Growth Symmetrical Asymmetrical   x    Skin  Creases Base of toes  Ankles   Base of Fingers knees       Abdominal pannus Thigh Lobules  Face/neck    X Pantaloon deformity  x       BLE COMPARATIVE LIMB VOLUMETRICS TBA OT Rx 1  LANDMARK RIGHT    R LEG (A-D) 7728.4 ml  R THIGH (E-G) 9757.0 ml  R FULL LIMB (A-G) 17485.3 ml  Limb Volume differential (LVD)  R Leg LVD= 3.6%, R>L, R THIGH LVD = 4.9, R>L, Full Limb LVD= 4.3%, R>L  Volume change since initial %  Volume  change overall V  (Blank rows = not tested)  LANDMARK LEFT  09/23/21  L LEG (A-D) 7447.0 ml  L THIGH (E-G) 9276.3 ml  L  FULL LIMB (A-G) 16725.2 ml  Limb Volume differential (LVD)  %  Volume change since initial %  Volume change overall %  (Blank rows = not tested)     LYMPHEDEMA ASSESSMENTS:   SURGERY TYPE/DATE: N/A non cancer related  INFECTIONS: denies hx cellulitis  WOUNDS: denies  INJURIES/TRAUMA: significant L achilles injury as a chid  LYMPHEDEMA ASSESSMENTS:   GAIT: Distance walked: >500' Assistive device utilized: None Level of assistance: Complete Independence Comments: WFL  LYMPHEDEMA LIFE IMPACT SCALE (LLIS):   Initial 10/11/23 60.29% (The extent of which lymphedema-related problems impacted your life in the last week)                                                                                                                       TREATMENT THIS DATE: RLE/RLQ MLD Pt/ family edu  PATIENT EDUCATION:  Continued Pt/ CG edu for lymphedema self care home program throughout session. Topics include outcome of comparative limb volumetrics- starting limb volume differentials (LVDs), technology and gradient techniques used for short stretch, multilayer compression wrapping, simple self-MLD, therapeutic lymphatic pumping exercises, skin/nail care, LE precautions, compression garment recommendations and specifications, wear and care schedule and compression garment donning / doffing w assistive devices. Discussed progress towards all OT goals  since commencing CDT. Discussed detrimental impact of obesity on lower and upper extremity lymphedema over time. Reviewed OT goals for lymphedema care with Pt and discussed progress to date.  All questions answered to the Pt's satisfaction. Good return. Person educated: Patient  Education method: Explanation, Demonstration, and Handouts Education comprehension: verbalized understanding, returned demonstration, verbal cues required, and needs further education   LYMPHEDEMA SELF-CARE HOME PROGRAM:  BLE lymphatic pumping there ex- 1 set of 10 each element, in order. Hold 5. 2 x daily 2. Daily, short stretch, thigh length, multilayer compression bandages during Intensive Phase CDT- one LE at a time 3. During Self-management Phase:   Custom-made gradient compression garments and HOS devices are medically necessary because they are uniquely sized and shaped to fit the exact dimensions of the affected extremities, and to provide appropriate medical grade, graduated compression essential for optimally managing chronic, progressive lymphedema. Multiple custom compression garments are needed to ensure proper hygiene to limit infection risk. Custom compression garments should be replaced q 3-6 months When worn consistently for optimal lipo-lymphedema self-management over time. HOS devices, medically necessary to limit fibrosis buildup in tissue, should be replaced q 2 years and PRN when worn out.   4. Daily skin care with low ph lotion matching skin ph 5. Daily use of advanced, sequential , pneumatic compression device (Flexitouch) combined with self-MLD     ASSESSMENT:  CLINICAL IMPRESSION: Pt tolerated compression wraps to leg recently but she noticed very little change after session. Pt took photos for before and after comparison. She states she is  willing to try bandaging again to see if it might make a difference. We'll plan to wrap again and teach Pt how to wrap at a future session. Continued  RLE/RLQ  MLD and provided Pt edu for simple self MLD. Pt tolerated manual therapy and asked excellent questions.  Cont as per POC.   (INITIAL EVAL: Kumari Sculley is a 53 yo female presenting with moderate, stage II, BLE, lipo-lymphedema 2/2 primary, stage 2, type 3 ( buttocks, abdomen and ankles), lipedema. Lipidema is a primary disorder characterized by the symmetrical fatty enlargement of bilateral legs, buttocks, abdomen, and sometimes the arms, due to excessive fat deposition, or storage, in the lower quadrants. The upper half of the body is unaffected, and despite recent weight loss, the lower half of the body is disproportionately larger than the upper half. As lipedema progresses fibrotic, nodular fat accumulates in the subcutis putting pressure on delicate lymphatics, which in turn leads to excessive, high protein lymph accumulation, resulting in chronic, progressive lymphedema. The combined condition of lipo-lymphedema is difficult to treat as lipedema does not typically respond well to Complete Decongestive Therapy (CDT). The lymphedema side of the condition may respond to varying degrees. Well fitting, comfortable compression garments that do not constrict or roll down often provide pain relief and enable improved functional performance. An advanced, sequential, pneumatic, compression device, the [employer] Flexitouch device is recommended because it addresses the affected abdomen and buttocks, proximal to the regional, inguinal lymph nodes. The advanced Flexitouch device is known reduce pain and lymphatic congestion, as well as infection risk in those with lipo-lymphedema. If Pt is using a basic vaso-pneumatic pump with squeeze and release function, it should be replaced with an advanced sequential pneumatic compression device for optimal, long term, daily, lipo-lymphedema self-management over time.    Ms Rumberger will benefit from skilled Occupational Therapy to reduce limb swelling and associated pain,  to limit progression and infection risk associated with chronic, progressive Lymphedema, and to learn lymphedema self management protocol for long term self management.   SABRA BLE lipo- lymphedema limits functional performance in all occupational domains, including functional mobility and ambulation, basic and instrumental ADLs, productive activities, leisure pursuits, social participation and quality of life. This patient will benefit from modified Intensive and Self-management Phase CDT . In addition to MLD, therex, skin care and compression bandaging below the knees, Pt will be fitted with custom knee length compression stockings:   OBJECTIVE IMPAIRMENTS: decreased knowledge of condition, decreased knowledge of use of DME, decreased mobility, decreased ROM, increased edema, impaired flexibility, impaired sensation, obesity, pain, and chronic, progressive, painful BLE/BLQ swelling.   ACTIVITY LIMITATIONS:  Limited Functional ambulation and mobility (dependent sitting, standing, walking, squatting, stairs, transfers, bed mobility) Impaired basic and instrumental ADLs: sleeping, grooming toe nails, inspecting skin and performing LE/LLQ skin care, LB bathing, LB dressing, fitting shoes,  Impaired social participation:  when extended standing, walking and sitting are necessary, such as when shopping, going to the movies, attending community and church functions Impaired leisure pursuits when extended sitting, standing , walking required Quality of Life (Psychosocial concerns ):   PERSONAL FACTORS: Time since onset of injury/illness/exacerbation and 3+ comorbidities: primary lipidema, obesity, chronic pain are also affecting patient's functional outcome.   REHAB POTENTIAL: Good  EVALUATION COMPLEXITY: Moderate   GOALS:Goals reviewed with patient? Yes   SHORT TERM GOALS: Target date: 4th OT Rx visit    Pt will demonstrate understanding of lymphedema precautions and prevention strategies with modified  independence using a printed reference  to identify at least 5 precautions and discussing how s/he may implement them into daily life to reduce risk of progression with modified assistance Baseline: max a Goal status: INITIAL   2.  With modified independence (extra time)  Pt will be able to apply multilayer, thigh length, compression wraps using gradient techniques  to decrease limb volume, to limit infection risk, and to limit lymphedema progression.  Given this patient's Intake score of tbd % on the  Baseline: Dependent Goal status: INITIAL     LONG TERM GOALS: Target date: 01/09/24(12 WEEKS)     Given this patient's Intake score of 60.29% on the Lymphedema Life Impact Scale (LLIS), patient will experience a reduction of at least 20 points in the extent to which lymphedema-elated problems impact her life on a weekly basis to her perceived level to improve functional performance and quality of life (QOL). Baseline: max a Goal status: INITIAL   2.  With modified independence (extra time and assistive devices) Pt will be able to don and doff appropriate compression garments and/or devices to control BLE lymphedema and to limit progression.  Baseline: Dependent Goal status: INITIAL  3. Pt  will be able to perform each lymphedema self-care home program component ( simple self MLD, lymphatic pumping ther ex, skin care, compression therapy) using correct techniques using printed work book as Education administrator by DC for optimal LE self management over time to limit progression.   4. Pt will achieve at least a 10% volume reductions bilaterally below the knees to return limb to more typical size and shape, to limit infection risk and LE progression, to decrease pain, to improve function, and to improve body image and QOL. Baseline: Dependent Goal status: INITIAL   5. Pt will achieve and sustain at least 85% attendance at OT sessions, and with compliance with all LE self-care home program components  throughout CDT, including modified simple self-MLD, daily skin care and inspection, lymphatic pumping the ex and appropriate compression to limit lymphedema progression and to limit further functional decline. Baseline: Dependent Goal status: INITIAL PLAN:  OT FREQUENCY: 2x/week and PRN  OT DURATION: other: 16 weeks and PRN due to bilaterality  PLANNED INTERVENTIONS: Complete Decongestive Therapy (CDT): Manual therapy (manual lymphatic drainage (MLD), therapeutic lymphatic pumping exercise, skin care to limit infection risk, compression ( wraps and garments, devices prn)[id number]-Therapeutic exercises, 97530- Therapeutic activity, 97535- Self Care, [id number]- Manual therapy, Patient/Family education, Manual lymph drainage, Compression bandaging, and DME instructions  PLAN FOR NEXT SESSION:  BLE comparative limb volumetrics Multilayer compression bandaging to one leg at a time. Start with knee length and build tolerance to thigh length. Pt. Family edu   Zebedee Dec, MS, OTR/L, CLT-LANA 11/16/23 8:08 AM

## 2023-11-22 ENCOUNTER — Ambulatory Visit: Admitting: Occupational Therapy

## 2023-11-29 ENCOUNTER — Ambulatory Visit: Admitting: Occupational Therapy

## 2023-11-29 DIAGNOSIS — I890 Lymphedema, not elsewhere classified: Secondary | ICD-10-CM

## 2023-11-30 NOTE — Therapy (Signed)
 OUTPATIENT OCCUPATIONAL THERAPY TREATMENT NOTE  BLE/BLQ  Lipo-Lymphedema   Patient Name: Norma Wall MRN: [id number] DOB:03-Jun-1970, 53 y.o., female Today's Date: 11/30/2023  END OF SESSION:  OT End of Session - 11/30/23 1300     Visit Number 7    Number of Visits 36    Date for Recertification  01/10/24    OT Start Time 0300    OT Stop Time 0400    OT Time Calculation (min) 60 min    Activity Tolerance Patient tolerated treatment well;No increased pain    Behavior During Therapy [facility] for tasks assessed/performed            Past Medical History:  Diagnosis Date   Abdominal pain, right upper quadrant 2013   Anxiety    Cancer (HCC)    basal cells- [place]   COVID-19 2021   Endometriosis 01/2013   Gallbladder polyp    Lipidemia 2020   Lymphedema 01/2019   Migraine headache    3-4x/yr   Ovarian cyst 2014   right ovary   PONV (postoperative nausea and vomiting)    Vertigo    resolves quickly   Past Surgical History:  Procedure Laterality Date   ABLATION  2012   cyst removal   CHOLECYSTECTOMY N/A 10/19/2018   Procedure: LAPAROSCOPIC CHOLECYSTECTOMY;  Surgeon: Norma Laneta FALCON, MD;  Location: [facility];  Service: General;  Laterality: N/A;   COLONOSCOPY WITH PROPOFOL  N/A 07/21/2020   Procedure: COLONOSCOPY WITH PROPOFOL ;  Surgeon: Norma Carmine, MD;  Location: [facility];  Service: Endoscopy;  Laterality: N/A;   CYSTOSCOPY N/A 09/25/2020   Procedure: CYSTOSCOPY;  Surgeon: Norma Read, MD;  Location: [facility];  Service: Gynecology;  Laterality: N/A;   CYSTOSCOPY W/ URETERAL STENT PLACEMENT Right 09/25/2020   Procedure: CYSTOSCOPY WITH RETROGRADE PYELOGRAM/URETERAL STENT PLACEMENT;  Surgeon: Norma Redell BROCKS, MD;  Location: [facility];  Service: Urology;  Laterality: Right;   LAPAROSCOPIC OVARIAN CYSTECTOMY Right 10/19/2018   Procedure: LAPAROSCOPIC RIGHT OVARIAN CYSTECTOMY;  Surgeon: Norma Read, MD;  Location: [facility];  Service: Gynecology;   Laterality: Right;   NECK SURGERY  02/22/2018   Artificial Disc replacement    OVARIAN CYST REMOVAL     POLYPECTOMY N/A 07/21/2020   Procedure: POLYPECTOMY;  Surgeon: Norma Carmine, MD;  Location: [facility];  Service: Endoscopy;  Laterality: N/A;   TOTAL LAPAROSCOPIC HYSTERECTOMY WITH SALPINGECTOMY Bilateral 09/25/2020   Procedure: TOTAL LAPAROSCOPIC HYSTERECTOMY WITH SALPINGECTOMY;  Surgeon: Norma Read, MD;  Location: [facility];  Service: Gynecology;  Laterality: Bilateral;   Patient Active Problem List   Diagnosis Date Noted   Hormone replacement therapy (HRT) 06/07/2023   Menopause 03/08/2023   Lower extremity numbness and tingling (Left) 02/24/2023   Lumbar radiculitis (Left) 02/24/2023   History of postoperative nausea and vomiting 11/25/2022   Lumbar facet joint hypertrophy (Multilevel) (Bilateral) 11/10/2022   Osteoarthritis of facet joint of lumbar spine 11/10/2022   Lumbosacral facet joint syndrome 11/10/2022   Osteoarthritis of lumbar spine 11/10/2022   Spondylosis without myelopathy or radiculopathy, lumbosacral region 11/10/2022   Lumbar lateral recess stenosis (Bilateral: L2-3, L3-4, L4-5) 11/10/2022   Lumbosacral foraminal stenosis (Bilateral: L3-4, L4-5) 11/10/2022   Ligamentum flavum hypertrophy (L4-5) 11/10/2022   Osteoarthritis of hip (Right) 11/10/2022   Enthesopathy of hip region (Right) 11/10/2022   Hamstring tendinitis of thigh (Right) 11/10/2022   Iliopsoas bursitis of hip (Left) 11/10/2022   Cervical disc protrusion at C5-6 (Left) 11/10/2022   Cervical disc herniation/extrusion at C6-7 level  (Right) 11/10/2022  Cervical C8 nerve root impingement (Right) 11/10/2022   Cervical lateral recess stenosis (Left: C5-6) (Right:C5-6) 11/10/2022   DDD (degenerative disc disease), cervical 11/10/2022   DDD (degenerative disc disease), lumbar 11/10/2022   Chronic hip pain (1ry area of Pain) (Right) 10/25/2022   Iliopsoas bursitis of hip (Right) 10/25/2022    Groin pain (Right) 10/25/2022   Chronic low back pain (Bilateral) w/o sciatica 10/25/2022   Chronic low back pain (2ry area of Pain) (Midline) w/o sciatica 10/25/2022   Lumbar facet joint pain 10/25/2022   Grade 1 Retrolisthesis of Lumbar region (L2/L3, L3/L4, L4/L5) 10/25/2022   Abnormal MRI, Hip (Right) (09/04/2022) 10/24/2022   Abnormal MRI, lumbar spine (09/03/2022) 10/24/2022   Chronic pain syndrome 10/24/2022   Pharmacologic therapy 10/24/2022   Disorder of skeletal system 10/24/2022   Problems influencing health status 10/24/2022   Abnormal MRI, cervical spine (01/18/2018) 10/24/2022   Arthralgia of right hip 06/29/2022   Multilevel lumbosacral spondylosis with radiculopathy 06/29/2022   Low libido 05/13/2022   Hot flashes 05/13/2022   Hematuria 03/01/2022   Left lower quadrant abdominal pain 03/01/2022   Right upper quadrant abdominal pain 03/01/2022   S/P laparoscopic hysterectomy 09/25/2020   Pelvic pain    Polyp of ascending colon    Lymphedema 01/20/2019   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 Always felt I were larger than my friends. With both of my pregnancies I had a lot of swelling in my legs ( 2000 and 2006). Also remember in 2010 noticing how swollen my ankles were after work.  SUBJECTIVE:   SUBJECTIVE STATEMENT: Pt presents for OT treatment visit to address BLE Lipo-lymphedema. Pt reports no new concerns. The patient had  no problems after MLD last session. Pt reports BLE pain as 1-2/10. It's hard tease out sometimes what's causing it.   (OT INITIAL EVAL: Norma Wall is referred to Occupational Therapy by Norma Eva, MD, for evaluation and treatment of BLE lymphedema. Pt relays that she requested referral so she could learn more about her diagnosis and explore treatment options.  Pt reports,  When growing up I always felt like I had big legs and feet. During both pregnancies her legs were abnormally swollen, and they remained swollen afterwards. Pt endorses family hx of chronic leg swelling in her mother and maternal grandmother. Pt reports she has not previously undergone formal lymphedema treatment in the past, but she tries to follow conservative measures, including leg elevation her.  She occasionally wears knee length compression stockings, but has never found any that are comfortable and that fit.  They roll down and they hurt. She reports she is diagnoses with CVI by Earma Johnita Birch, MD,  at the [facility] in [place], [place]. Pt reports she has undergone several vein ablations. She reports she also uses a compression pump regularly, but she is unsure of the manufacturer. Pt's goals for OT are,  to L learn about MLD and get comfortable compression that works.   PERTINENT HISTORY: CVI ( multiple vein ablations at  chronic hip and back pain 2/2 multiple complex problems including multiple level DJD, OA,  Multilevel lumbosacral spondylosis with radiculopathy, tendonitis, bursitis, obesity (30-39.9), Lipedema  PAIN:  Are you having pain? Yes: NPRS scale: 1-2/10 on average Pain location: full limb and worse in thighs Pain description: tenderness, dull deep ache, almost like a bone ache, hypersensitive to touch Aggravating factors: worse at rest, at night it's more, climbing stairs and inclines Relieving factors: conservative therapies- pump, epsom salts baths, vibration plate,  elevation  PRECAUTIONS: Other: Lymphedema  FALLS:  Has patient fallen in last 6 months? No  LIVING ENVIRONMENT: Lives with:  spouse, 31 yo son and dog Lives in: House/apartment Stairs: Yes a few to enter; Internal: 1 flight steps; on right going up and   Has following equipment at home: None  OCCUPATION: office manager   LEISURE: reading, crafts, photography, kyacking, fishing  HAND DOMINANCE: right   PRIOR LEVEL OF FUNCTION: Independent  PATIENT GOALS: Learn about MLD; get comfortable compression that works  OBJECTIVE: Note: Objective measures were completed at Evaluation unless otherwise noted.  COGNITION:  Overall cognitive status: Within functional limits for tasks assessed   OBSERVATIONS / OTHER ASSESSMENTS:   POSTURE: WNL  LE ROM: WFL  STRENGTH: WFL  Mild, Stage  II, Bilateral Lower Extremity Lymphedema 2/2 Lipedema and CVI   Skin  Description Hyper-Keratosis Peau d' Orange Shiny Tight Fibrotic/ Indurated Fatty Doughy Spongy/ boggy   x   x Fatty fibrosis, nodular fat x x x   Skin dry Flaky WNL Macerated     x    Color Redness Varicosities Blanching Hemosiderin Stain Mottled    x    x   Odor Malodorous Yeast Fungal infection  WNL      x   Temperature Warm Cool wnl    x     Pitting Edema   1+ 2+ 3+ 4+ Non-pitting         x   Girth Symmetrical Asymmetrical                   Distribution   X Feet spared  toes to abdomen, hips and buttocks    Stemmer Sign Positive Negative    Feet spared   Lymphorrhea History Of:  Present Absent     x    Wounds History Of Present Absent Venous Arterial Pressure Sheer     x        Signs of Infection Redness Warmth Erythema Acute Swelling Drainage Borders                    Sensation Light Touch Deep pressure Hypersensitivity   In Tact Impaired In Tact Impaired Absent Impaired   x  x   x    Nails WNL   Fungus nail dystrophy   x     Hair Growth Symmetrical Asymmetrical   x    Skin  Creases Base of toes  Ankles   Base of Fingers knees       Abdominal pannus Thigh Lobules  Face/neck    X Pantaloon deformity  x       BLE COMPARATIVE LIMB VOLUMETRICS TBA OT Rx 1  LANDMARK RIGHT    R LEG (A-D) 7728.4 ml  R THIGH (E-G) 9757.0 ml  R FULL LIMB (A-G) 17485.3 ml  Limb Volume differential (LVD)  R Leg LVD= 3.6%, R>L, R THIGH LVD = 4.9, R>L, Full Limb LVD= 4.3%, R>L  Volume change since initial %  Volume  change overall V  (Blank rows = not tested)  LANDMARK LEFT  09/23/21  L LEG (A-D) 7447.0 ml  L THIGH (E-G) 9276.3 ml  L  FULL LIMB (A-G) 16725.2 ml  Limb Volume differential (LVD)  %  Volume change since initial %  Volume change overall %  (Blank rows = not tested)     LYMPHEDEMA ASSESSMENTS:   SURGERY TYPE/DATE: N/A non cancer related  INFECTIONS: denies hx cellulitis  WOUNDS: denies  INJURIES/TRAUMA: significant L achilles injury as a chid  LYMPHEDEMA ASSESSMENTS:   GAIT: Distance walked: >500' Assistive device utilized: None Level of assistance: Complete Independence Comments: WFL  LYMPHEDEMA LIFE IMPACT SCALE (LLIS):   Initial 10/11/23 60.29% (The extent of which lymphedema-related problems impacted your life in the last week)                                                                                                                       TREATMENT THIS DATE: RLE/RLQ MLD Pt/ family edu  PATIENT EDUCATION:  Continued Pt/ CG edu for lymphedema self care home program throughout session. Topics include outcome of comparative limb volumetrics- starting limb volume differentials (LVDs), technology and gradient techniques used for short stretch, multilayer compression wrapping, simple self-MLD, therapeutic lymphatic pumping exercises, skin/nail care, LE precautions, compression garment recommendations and specifications, wear and care schedule and compression garment donning / doffing w assistive devices. Discussed progress towards all OT goals  since commencing CDT. Discussed detrimental impact of obesity on lower and upper extremity lymphedema over time. Reviewed OT goals for lymphedema care with Pt and discussed progress to date.  All questions answered to the Pt's satisfaction. Good return. Person educated: Patient  Education method: Explanation, Demonstration, and Handouts Education comprehension: verbalized understanding, returned demonstration, verbal cues required, and needs further education   LYMPHEDEMA SELF-CARE HOME PROGRAM:  BLE lymphatic pumping there ex- 1 set of 10 each element, in order. Hold 5. 2 x daily 2. Daily, short stretch, thigh length, multilayer compression bandages during Intensive Phase CDT- one LE at a time 3. During Self-management Phase:   Custom-made gradient compression garments and HOS devices are medically necessary because they are uniquely sized and shaped to fit the exact dimensions of the affected extremities, and to provide appropriate medical grade, graduated compression essential for optimally managing chronic, progressive lymphedema. Multiple custom compression garments are needed to ensure proper hygiene to limit infection risk. Custom compression garments should be replaced q 3-6 months When worn consistently for optimal lipo-lymphedema self-management over time. HOS devices, medically necessary to limit fibrosis buildup in tissue, should be replaced q 2 years and PRN when worn out.   4. Daily skin care with low ph lotion matching skin ph 5. Daily use of advanced, sequential , pneumatic compression device (Flexitouch) combined with self-MLD     ASSESSMENT:  CLINICAL IMPRESSION: Pt tolerated compression wraps to leg recently but she noticed very little change after session. Pt took photos for before and after comparison. She states she is  willing to try bandaging again to see if it might make a difference. We'll plan to wrap again and teach Pt how to wrap at a future session. Continued  RLE/RLQ  MLD and provided Pt edu for simple self MLD. Pt tolerated manual therapy and asked excellent questions.  Cont as per POC.   (INITIAL EVAL: Norma Wall is a 53 yo female presenting with moderate, stage II, BLE, lipo-lymphedema 2/2 primary, stage 2, type 3 ( buttocks, abdomen and ankles), lipedema. Lipidema is a primary disorder characterized by the symmetrical fatty enlargement of bilateral legs, buttocks, abdomen, and sometimes the arms, due to excessive fat deposition, or storage, in the lower quadrants. The upper half of the body is unaffected, and despite recent weight loss, the lower half of the body is disproportionately larger than the upper half. As lipedema progresses fibrotic, nodular fat accumulates in the subcutis putting pressure on delicate lymphatics, which in turn leads to excessive, high protein lymph accumulation, resulting in chronic, progressive lymphedema. The combined condition of lipo-lymphedema is difficult to treat as lipedema does not typically respond well to Complete Decongestive Therapy (CDT). The lymphedema side of the condition may respond to varying degrees. Well fitting, comfortable compression garments that do not constrict or roll down often provide pain relief and enable improved functional performance. An advanced, sequential, pneumatic, compression device, the [employer] Flexitouch device is recommended because it addresses the affected abdomen and buttocks, proximal to the regional, inguinal lymph nodes. The advanced Flexitouch device is known reduce pain and lymphatic congestion, as well as infection risk in those with lipo-lymphedema. If Pt is using a basic vaso-pneumatic pump with squeeze and release function, it should be replaced with an advanced sequential pneumatic compression device for optimal, long term, daily, lipo-lymphedema self-management over time.    Ms Ellinger will benefit from skilled Occupational Therapy to reduce limb swelling and associated pain,  to limit progression and infection risk associated with chronic, progressive Lymphedema, and to learn lymphedema self management protocol for long term self management.   SABRA BLE lipo- lymphedema limits functional performance in all occupational domains, including functional mobility and ambulation, basic and instrumental ADLs, productive activities, leisure pursuits, social participation and quality of life. This patient will benefit from modified Intensive and Self-management Phase CDT . In addition to MLD, therex, skin care and compression bandaging below the knees, Pt will be fitted with custom knee length compression stockings:   OBJECTIVE IMPAIRMENTS: decreased knowledge of condition, decreased knowledge of use of DME, decreased mobility, decreased ROM, increased edema, impaired flexibility, impaired sensation, obesity, pain, and chronic, progressive, painful BLE/BLQ swelling.   ACTIVITY LIMITATIONS:  Limited Functional ambulation and mobility (dependent sitting, standing, walking, squatting, stairs, transfers, bed mobility) Impaired basic and instrumental ADLs: sleeping, grooming toe nails, inspecting skin and performing LE/LLQ skin care, LB bathing, LB dressing, fitting shoes,  Impaired social participation:  when extended standing, walking and sitting are necessary, such as when shopping, going to the movies, attending community and church functions Impaired leisure pursuits when extended sitting, standing , walking required Quality of Life (Psychosocial concerns ):   PERSONAL FACTORS: Time since onset of injury/illness/exacerbation and 3+ comorbidities: primary lipidema, obesity, chronic pain are also affecting patient's functional outcome.   REHAB POTENTIAL: Good  EVALUATION COMPLEXITY: Moderate   GOALS:Goals reviewed with patient? Yes   SHORT TERM GOALS: Target date: 4th OT Rx visit    Pt will demonstrate understanding of lymphedema precautions and prevention strategies with modified  independence using a printed reference  to identify at least 5 precautions and discussing how s/he may implement them into daily life to reduce risk of progression with modified assistance Baseline: max a Goal status: INITIAL   2.  With modified independence (extra time)  Pt will be able to apply multilayer, thigh length, compression wraps using gradient techniques  to decrease limb volume, to limit infection risk, and to limit lymphedema progression.  Given this patient's Intake score of tbd % on the  Baseline: Dependent Goal status: INITIAL     LONG TERM GOALS: Target date: 01/09/24(12 WEEKS)     Given this patient's Intake score of 60.29% on the Lymphedema Life Impact Scale (LLIS), patient will experience a reduction of at least 20 points in the extent to which lymphedema-elated problems impact her life on a weekly basis to her perceived level to improve functional performance and quality of life (QOL). Baseline: max a Goal status: INITIAL   2.  With modified independence (extra time and assistive devices) Pt will be able to don and doff appropriate compression garments and/or devices to control BLE lymphedema and to limit progression.  Baseline: Dependent Goal status: INITIAL  3. Pt  will be able to perform each lymphedema self-care home program component ( simple self MLD, lymphatic pumping ther ex, skin care, compression therapy) using correct techniques using printed work book as education administrator by DC for optimal LE self management over time to limit progression.   4. Pt will achieve at least a 10% volume reductions bilaterally below the knees to return limb to more typical size and shape, to limit infection risk and LE progression, to decrease pain, to improve function, and to improve body image and QOL. Baseline: Dependent Goal status: INITIAL   5. Pt will achieve and sustain at least 85% attendance at OT sessions, and with compliance with all LE self-care home program components  throughout CDT, including modified simple self-MLD, daily skin care and inspection, lymphatic pumping the ex and appropriate compression to limit lymphedema progression and to limit further functional decline. Baseline: Dependent Goal status: INITIAL PLAN:  OT FREQUENCY: 2x/week and PRN  OT DURATION: other: 16 weeks and PRN due to bilaterality  PLANNED INTERVENTIONS: Complete Decongestive Therapy (CDT): Manual therapy (manual lymphatic drainage (MLD), therapeutic lymphatic pumping exercise, skin care to limit infection risk, compression ( wraps and garments, devices prn)[id number]-Therapeutic exercises, 97530- Therapeutic activity, 97535- Self Care, [id number]- Manual therapy, Patient/Family education, Manual lymph drainage, Compression bandaging, and DME instructions  PLAN FOR NEXT SESSION:  BLE comparative limb volumetrics Multilayer compression bandaging to one leg at a time. Start with knee length and build tolerance to thigh length. Pt. Family edu   Zebedee Dec, MS, OTR/L, CLT-LANA 11/30/23 1:02 PM

## 2023-12-06 ENCOUNTER — Ambulatory Visit: Admitting: Occupational Therapy

## 2023-12-06 DIAGNOSIS — I872 Venous insufficiency (chronic) (peripheral): Secondary | ICD-10-CM | POA: Diagnosis not present

## 2023-12-08 ENCOUNTER — Ambulatory Visit: Admitting: Occupational Therapy

## 2023-12-15 ENCOUNTER — Ambulatory Visit: Admitting: Occupational Therapy

## 2023-12-16 ENCOUNTER — Encounter: Payer: Self-pay | Admitting: Occupational Therapy

## 2023-12-16 ENCOUNTER — Ambulatory Visit: Attending: Family Medicine | Admitting: Occupational Therapy

## 2023-12-16 DIAGNOSIS — I890 Lymphedema, not elsewhere classified: Secondary | ICD-10-CM | POA: Diagnosis not present

## 2023-12-16 NOTE — Therapy (Signed)
 OUTPATIENT OCCUPATIONAL THERAPY TREATMENT NOTE  BLE/BLQ  Lipo-Lymphedema   Patient Name: Norma Wall MRN: [id number] DOB:March 26, 1970, 53 y.o., female Today's Date: 12/16/2023  END OF SESSION:  OT End of Session - 12/16/23 0805     Visit Number 8    Number of Visits 36    Date for Recertification  01/10/24    OT Start Time 0800    OT Stop Time 0900    OT Time Calculation (min) 60 min    Activity Tolerance Patient tolerated treatment well;No increased pain    Behavior During Therapy [facility] for tasks assessed/performed            Past Medical History:  Diagnosis Date   Abdominal pain, right upper quadrant 2013   Anxiety    Cancer (HCC)    basal cells- [place]   COVID-19 2021   Endometriosis 01/2013   Gallbladder polyp    Lipidemia 2020   Lymphedema 01/2019   Migraine headache    3-4x/yr   Ovarian cyst 2014   right ovary   PONV (postoperative nausea and vomiting)    Vertigo    resolves quickly   Past Surgical History:  Procedure Laterality Date   ABLATION  2012   cyst removal   CHOLECYSTECTOMY N/A 10/19/2018   Procedure: LAPAROSCOPIC CHOLECYSTECTOMY;  Surgeon: Jordis Laneta FALCON, MD;  Location: [facility];  Service: General;  Laterality: N/A;   COLONOSCOPY WITH PROPOFOL  N/A 07/21/2020   Procedure: COLONOSCOPY WITH PROPOFOL ;  Surgeon: Jinny Carmine, MD;  Location: [facility];  Service: Endoscopy;  Laterality: N/A;   CYSTOSCOPY N/A 09/25/2020   Procedure: CYSTOSCOPY;  Surgeon: Lake Read, MD;  Location: [facility];  Service: Gynecology;  Laterality: N/A;   CYSTOSCOPY W/ URETERAL STENT PLACEMENT Right 09/25/2020   Procedure: CYSTOSCOPY WITH RETROGRADE PYELOGRAM/URETERAL STENT PLACEMENT;  Surgeon: Francisca Redell BROCKS, MD;  Location: [facility];  Service: Urology;  Laterality: Right;   LAPAROSCOPIC OVARIAN CYSTECTOMY Right 10/19/2018   Procedure: LAPAROSCOPIC RIGHT OVARIAN CYSTECTOMY;  Surgeon: Lake Read, MD;  Location: [facility];  Service: Gynecology;   Laterality: Right;   NECK SURGERY  02/22/2018   Artificial Disc replacement    OVARIAN CYST REMOVAL     POLYPECTOMY N/A 07/21/2020   Procedure: POLYPECTOMY;  Surgeon: Jinny Carmine, MD;  Location: [facility];  Service: Endoscopy;  Laterality: N/A;   TOTAL LAPAROSCOPIC HYSTERECTOMY WITH SALPINGECTOMY Bilateral 09/25/2020   Procedure: TOTAL LAPAROSCOPIC HYSTERECTOMY WITH SALPINGECTOMY;  Surgeon: Lake Read, MD;  Location: [facility];  Service: Gynecology;  Laterality: Bilateral;   Patient Active Problem List   Diagnosis Date Noted   Hormone replacement therapy (HRT) 06/07/2023   Menopause 03/08/2023   Lower extremity numbness and tingling (Left) 02/24/2023   Lumbar radiculitis (Left) 02/24/2023   History of postoperative nausea and vomiting 11/25/2022   Lumbar facet joint hypertrophy (Multilevel) (Bilateral) 11/10/2022   Osteoarthritis of facet joint of lumbar spine 11/10/2022   Lumbosacral facet joint syndrome 11/10/2022   Osteoarthritis of lumbar spine 11/10/2022   Spondylosis without myelopathy or radiculopathy, lumbosacral region 11/10/2022   Lumbar lateral recess stenosis (Bilateral: L2-3, L3-4, L4-5) 11/10/2022   Lumbosacral foraminal stenosis (Bilateral: L3-4, L4-5) 11/10/2022   Ligamentum flavum hypertrophy (L4-5) 11/10/2022   Osteoarthritis of hip (Right) 11/10/2022   Enthesopathy of hip region (Right) 11/10/2022   Hamstring tendinitis of thigh (Right) 11/10/2022   Iliopsoas bursitis of hip (Left) 11/10/2022   Cervical disc protrusion at C5-6 (Left) 11/10/2022   Cervical disc herniation/extrusion at C6-7 level  (Right) 11/10/2022  Cervical C8 nerve root impingement (Right) 11/10/2022   Cervical lateral recess stenosis (Left: C5-6) (Right:C5-6) 11/10/2022   DDD (degenerative disc disease), cervical 11/10/2022   DDD (degenerative disc disease), lumbar 11/10/2022   Chronic hip pain (1ry area of Pain) (Right) 10/25/2022   Iliopsoas bursitis of hip (Right) 10/25/2022    Groin pain (Right) 10/25/2022   Chronic low back pain (Bilateral) w/o sciatica 10/25/2022   Chronic low back pain (2ry area of Pain) (Midline) w/o sciatica 10/25/2022   Lumbar facet joint pain 10/25/2022   Grade 1 Retrolisthesis of Lumbar region (L2/L3, L3/L4, L4/L5) 10/25/2022   Abnormal MRI, Hip (Right) (09/04/2022) 10/24/2022   Abnormal MRI, lumbar spine (09/03/2022) 10/24/2022   Chronic pain syndrome 10/24/2022   Pharmacologic therapy 10/24/2022   Disorder of skeletal system 10/24/2022   Problems influencing health status 10/24/2022   Abnormal MRI, cervical spine (01/18/2018) 10/24/2022   Arthralgia of right hip 06/29/2022   Multilevel lumbosacral spondylosis with radiculopathy 06/29/2022   Low libido 05/13/2022   Hot flashes 05/13/2022   Hematuria 03/01/2022   Left lower quadrant abdominal pain 03/01/2022   Right upper quadrant abdominal pain 03/01/2022   S/P laparoscopic hysterectomy 09/25/2020   Pelvic pain    Polyp of ascending colon    Lymphedema 01/20/2019   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 Always felt I were larger than my friends. With both of my pregnancies I had a lot of swelling in my legs ( 2000 and 2006). Also remember in 2010 noticing how swollen my ankles were after work.  SUBJECTIVE:   SUBJECTIVE STATEMENT: Pt presents for OT treatment visit to address BLE Lipo-lymphedema. Pt reports no new concerns. The patient had  no problems after MLD last session. Pt reports BLE pain as 1-2/10. Pt reports she has not heard from DME vendor re the cost of her garments. She states she did get an email confirming she has insurance coverage.   (OT INITIAL EVAL: [name] is referred to Occupational Therapy by Jon Eva, MD, for evaluation and treatment of BLE lymphedema. Pt relays that she requested referral so she could learn more about her diagnosis and explore treatment options.  Pt reports,  When growing up I always felt like I had big legs and feet. During both pregnancies her legs were abnormally swollen, and they remained swollen afterwards. Pt endorses family hx of chronic leg swelling in her mother and maternal grandmother. Pt reports she has not previously undergone formal lymphedema treatment in the past, but she tries to follow conservative measures, including leg elevation her.  She occasionally wears knee length compression stockings, but has never found any that are comfortable and that fit.  They roll down and they hurt. She reports she is diagnoses with CVI by Earma Johnita Birch, MD,  at the [facility] in [place], [place]. Pt reports she has undergone several vein ablations. She reports she also uses a compression pump regularly, but she is unsure of the manufacturer. Pt's goals for OT are,  to L  learn about MLD and get comfortable compression that works.   PERTINENT HISTORY: CVI ( multiple vein ablations at chronic hip and back pain 2/2 multiple complex problems including multiple level DJD, OA,  Multilevel lumbosacral spondylosis with radiculopathy, tendonitis, bursitis, obesity (30-39.9), Lipedema  PAIN:  Are you having pain? Yes: NPRS scale: 1-2/10 on average Pain location: full limb and worse in thighs Pain description: tenderness, dull deep ache, almost like a bone ache, hypersensitive to touch Aggravating factors: worse at rest, at night it's more, climbing stairs and  inclines Relieving factors: conservative therapies- pump, epsom salts baths, vibration plate, elevation  PRECAUTIONS: Other: Lymphedema  FALLS:  Has patient fallen in last 6 months? No  LIVING ENVIRONMENT: Lives with:  spouse, 68 yo son and dog Lives in: House/apartment Stairs: Yes a few to enter; Internal: 1 flight steps; on right going up and   Has following equipment at home: None  OCCUPATION: office manager   LEISURE: reading, crafts, photography, kyacking, fishing  HAND DOMINANCE: right   PRIOR LEVEL OF FUNCTION: Independent  PATIENT GOALS: Learn about MLD; get comfortable compression that works  OBJECTIVE: Note: Objective measures were completed at Evaluation unless otherwise noted.  COGNITION:  Overall cognitive status: Within functional limits for tasks assessed   OBSERVATIONS / OTHER ASSESSMENTS:   POSTURE: WNL  LE ROM: WFL  STRENGTH: WFL  Mild, Stage  II, Bilateral Lower Extremity Lymphedema 2/2 Lipedema and CVI   Skin  Description Hyper-Keratosis Peau d' Orange Shiny Tight Fibrotic/ Indurated Fatty Doughy Spongy/ boggy   x   x Fatty fibrosis, nodular fat x x x   Skin dry Flaky WNL Macerated     x    Color Redness Varicosities Blanching Hemosiderin Stain Mottled    x    x   Odor Malodorous Yeast Fungal infection  WNL      x   Temperature Warm Cool wnl    x     Pitting Edema   1+ 2+ 3+ 4+ Non-pitting         x   Girth Symmetrical Asymmetrical                   Distribution   X Feet spared  toes to abdomen, hips and buttocks    Stemmer Sign Positive Negative    Feet spared   Lymphorrhea History Of:  Present Absent     x    Wounds History Of Present Absent Venous Arterial Pressure Sheer     x        Signs of Infection Redness Warmth Erythema Acute Swelling Drainage Borders                    Sensation Light Touch Deep pressure Hypersensitivity   In Tact Impaired In Tact Impaired Absent Impaired   x  x   x    Nails  WNL   Fungus nail dystrophy   x     Hair Growth Symmetrical Asymmetrical   x    Skin Creases Base of toes  Ankles   Base of Fingers knees       Abdominal pannus Thigh Lobules  Face/neck    X Pantaloon deformity  x       BLE COMPARATIVE LIMB VOLUMETRICS TBA OT Rx 1  LANDMARK RIGHT    R LEG (A-D) 7728.4 ml  R THIGH (E-G) 9757.0 ml  R FULL LIMB (A-G) 17485.3 ml  Limb Volume differential (LVD)  R Leg LVD= 3.6%, R>L,  R THIGH LVD = 4.9, R>L, Full Limb LVD= 4.3%, R>L  Volume change since initial %  Volume change overall V  (Blank rows = not tested)  LANDMARK LEFT  09/23/21  L LEG (A-D) 7447.0 ml  L THIGH (E-G) 9276.3 ml  L  FULL LIMB (A-G) 16725.2 ml  Limb Volume differential (LVD)  %  Volume change since initial %  Volume change overall %  (Blank rows = not tested)     LYMPHEDEMA ASSESSMENTS:   SURGERY TYPE/DATE: N/A non cancer related  INFECTIONS: denies hx cellulitis  WOUNDS: denies  INJURIES/TRAUMA: significant L achilles injury as a chid  LYMPHEDEMA ASSESSMENTS:   GAIT: Distance walked: >500' Assistive device utilized: None Level of assistance: Complete Independence Comments: WFL  LYMPHEDEMA LIFE IMPACT SCALE (LLIS):   Initial 10/11/23 60.29% (The extent of which lymphedema-related problems impacted your life in the last week)                                                                                                                       TREATMENT THIS DATE: RLE/RLQ MLD Pt/ family edu  PATIENT EDUCATION:  Continued Pt/ CG edu for lymphedema self care home program throughout session. Topics include outcome of comparative limb volumetrics- starting limb volume differentials (LVDs), technology and gradient techniques used for short stretch, multilayer compression wrapping, simple self-MLD, therapeutic lymphatic pumping exercises, skin/nail care, LE precautions, compression garment recommendations and specifications, wear and care schedule and  compression garment donning / doffing w assistive devices. Discussed progress towards all OT goals since commencing CDT. Discussed detrimental impact of obesity on lower and upper extremity lymphedema over time. Reviewed OT goals for lymphedema care with Pt and discussed progress to date.  All questions answered to the Pt's satisfaction. Good return. Person educated: Patient  Education method: Explanation, Demonstration, and Handouts Education comprehension: verbalized understanding, returned demonstration, verbal cues required, and needs further education   LYMPHEDEMA SELF-CARE HOME PROGRAM:  BLE lymphatic pumping there ex- 1 set of 10 each element, in order. Hold 5. 2 x daily 2. Daily, short stretch, thigh length, multilayer compression bandages during Intensive Phase CDT- one LE at a time 3. During Self-management Phase:   Ready-to-wear, medical grade, gradient compression garments  are necessary to provide appropriate compression and containment when performing lite duty daily activities. Appropriate compression garments ar essential for managing chronic, progressive lipo-lymphedema.  Pt should be fit with 3 prs of Juzo SENSATION (3900-AT) pantyhose, 15-20 mmHg, SIZE L-PLUS, Regular length.  Custom, flat knit, medical grade, gradient, compression garments are medically necessary when performing heavy physical activities. For managing chronic, progressive lipo-lymphedema Pt should be fit with custom, flat knit compression garments that  fit Pt's dimensions exactly for more physically demanding activities that significantly increase the lymphatic load in tissues.  Multiple custom compression garments are needed to ensure proper hygiene to limit infection risk. Custom compression garments should be replaced q 3-6 months When worn consistently for optimal lipo-lymphedema self-management over time. HOS devices, medically  necessary to limit fibrosis buildup in tissue, should be replaced q 2 years and  PRN when worn out.   4. Daily skin care with low ph lotion matching skin ph 5. Daily use of advanced, sequential , pneumatic compression device (Flexitouch) combined with self-MLD     ASSESSMENT:  CLINICAL IMPRESSION:  Continued  RLE/RLQ MLD and provided Pt edu for simple self MLD. Utilized in tact inguinal watershed after activating terminus. Pt performs diaphragmatic breathing independently  Pt tolerated manual therapy and asked excellent questions. No palpable or visible volume or tissue density reduction observed after MLD. OT faxed note and compression garment specifications to DME vendor after session.  Pt is ready to be fit with Ready-to-wear, medical grade, gradient compression garments  are necessary to provide appropriate compression and containment when performing lite duty daily activities. Appropriate compression garments ar essential for managing chronic, progressive lipo-lymphedema.  Pt should be fit with 3 prs of Juzo SENSATION (3900-AT) pantyhose, 15-20 mmHg, SIZE L-PLUS, Regular length.  Cont as per POC.   (INITIAL EVAL: Coumba Kellison is a 53 yo female presenting with moderate, stage II, BLE, lipo-lymphedema 2/2 primary, stage 2, type 3 ( buttocks, abdomen and ankles), lipedema. Lipidema is a primary disorder characterized by the symmetrical fatty enlargement of bilateral legs, buttocks, abdomen, and sometimes the arms, due to excessive fat deposition, or storage, in the lower quadrants. The upper half of the body is unaffected, and despite recent weight loss, the lower half of the body is disproportionately larger than the upper half. As lipedema progresses fibrotic, nodular fat accumulates in the subcutis putting pressure on delicate lymphatics, which in turn leads to excessive, high protein lymph accumulation, resulting in chronic, progressive lymphedema. The combined condition of lipo-lymphedema is difficult to treat as lipedema does not typically respond well to Complete  Decongestive Therapy (CDT). The lymphedema side of the condition may respond to varying degrees. Well fitting, comfortable compression garments that do not constrict or roll down often provide pain relief and enable improved functional performance. An advanced, sequential, pneumatic, compression device, the [employer] Flexitouch device is recommended because it addresses the affected abdomen and buttocks, proximal to the regional, inguinal lymph nodes. The advanced Flexitouch device is known reduce pain and lymphatic congestion, as well as infection risk in those with lipo-lymphedema. If Pt is using a basic vaso-pneumatic pump with squeeze and release function, it should be replaced with an advanced sequential pneumatic compression device for optimal, long term, daily, lipo-lymphedema self-management over time.    Ms Azimi will benefit from skilled Occupational Therapy to reduce limb swelling and associated pain, to limit progression and infection risk associated with chronic, progressive Lymphedema, and to learn lymphedema self management protocol for long term self management.   SABRA BLE lipo- lymphedema limits functional performance in all occupational domains, including functional mobility and ambulation, basic and instrumental ADLs, productive activities, leisure pursuits, social participation and quality of life. This patient will benefit from modified Intensive and Self-management Phase CDT . In addition to MLD, therex, skin care and compression bandaging below the knees, Pt will be fitted with custom knee length compression stockings:   OBJECTIVE IMPAIRMENTS: decreased knowledge of condition, decreased knowledge of use of DME, decreased mobility, decreased ROM, increased edema, impaired flexibility, impaired sensation, obesity, pain, and chronic, progressive, painful BLE/BLQ swelling.   ACTIVITY LIMITATIONS:  Limited Functional ambulation and mobility (dependent sitting, standing, walking,  squatting, stairs, transfers, bed mobility) Impaired basic and instrumental ADLs: sleeping, grooming toe nails, inspecting skin and  performing LE/LLQ skin care, LB bathing, LB dressing, fitting shoes,  Impaired social participation:  when extended standing, walking and sitting are necessary, such as when shopping, going to the movies, attending community and church functions Impaired leisure pursuits when extended sitting, standing , walking required Quality of Life (Psychosocial concerns ):   PERSONAL FACTORS: Time since onset of injury/illness/exacerbation and 3+ comorbidities: primary lipidema, obesity, chronic pain are also affecting patient's functional outcome.   REHAB POTENTIAL: Good  EVALUATION COMPLEXITY: Moderate   GOALS:Goals reviewed with patient? Yes   SHORT TERM GOALS: Target date: 4th OT Rx visit    Pt will demonstrate understanding of lymphedema precautions and prevention strategies with modified independence using a printed reference to identify at least 5 precautions and discussing how s/he may implement them into daily life to reduce risk of progression with modified assistance Baseline: max a Goal status: INITIAL   2.  With modified independence (extra time)  Pt will be able to apply multilayer, thigh length, compression wraps using gradient techniques  to decrease limb volume, to limit infection risk, and to limit lymphedema progression.  Given this patient's Intake score of tbd % on the  Baseline: Dependent Goal status: INITIAL     LONG TERM GOALS: Target date: 01/09/24(12 WEEKS)     Given this patient's Intake score of 60.29% on the Lymphedema Life Impact Scale (LLIS), patient will experience a reduction of at least 20 points in the extent to which lymphedema-elated problems impact her life on a weekly basis to her perceived level to improve functional performance and quality of life (QOL). Baseline: max a Goal status: INITIAL   2.  With modified independence  (extra time and assistive devices) Pt will be able to don and doff appropriate compression garments and/or devices to control BLE lymphedema and to limit progression.  Baseline: Dependent Goal status: INITIAL  3. Pt  will be able to perform each lymphedema self-care home program component ( simple self MLD, lymphatic pumping ther ex, skin care, compression therapy) using correct techniques using printed work book as education administrator by DC for optimal LE self management over time to limit progression.   4. Pt will achieve at least a 10% volume reductions bilaterally below the knees to return limb to more typical size and shape, to limit infection risk and LE progression, to decrease pain, to improve function, and to improve body image and QOL. Baseline: Dependent Goal status: INITIAL   5. Pt will achieve and sustain at least 85% attendance at OT sessions, and with compliance with all LE self-care home program components throughout CDT, including modified simple self-MLD, daily skin care and inspection, lymphatic pumping the ex and appropriate compression to limit lymphedema progression and to limit further functional decline. Baseline: Dependent Goal status: INITIAL PLAN:  OT FREQUENCY: 2x/week and PRN  OT DURATION: other: 16 weeks and PRN due to bilaterality  PLANNED INTERVENTIONS: Complete Decongestive Therapy (CDT): Manual therapy (manual lymphatic drainage (MLD), therapeutic lymphatic pumping exercise, skin care to limit infection risk, compression ( wraps and garments, devices prn)97110-Therapeutic exercises, 97530- Therapeutic activity, 97535- Self Care, [id number]- Manual therapy, Patient/Family education, Manual lymph drainage, Compression bandaging, and DME instructions  PLAN FOR NEXT SESSION:  BLE comparative limb volumetrics Multilayer compression bandaging to one leg at a time. Start with knee length and build tolerance to thigh length. Pt. Family edu   Zebedee Dec, MS, OTR/L,  CLT-LANA 12/16/23 10:13 AM

## 2023-12-20 ENCOUNTER — Ambulatory Visit: Admitting: Occupational Therapy

## 2023-12-20 DIAGNOSIS — I872 Venous insufficiency (chronic) (peripheral): Secondary | ICD-10-CM | POA: Diagnosis not present

## 2023-12-24 DIAGNOSIS — I890 Lymphedema, not elsewhere classified: Secondary | ICD-10-CM | POA: Diagnosis not present

## 2023-12-27 ENCOUNTER — Ambulatory Visit: Admitting: Occupational Therapy

## 2023-12-27 DIAGNOSIS — I872 Venous insufficiency (chronic) (peripheral): Secondary | ICD-10-CM | POA: Diagnosis not present

## 2024-01-19 ENCOUNTER — Ambulatory Visit: Attending: Family Medicine | Admitting: Occupational Therapy

## 2024-01-19 DIAGNOSIS — I890 Lymphedema, not elsewhere classified: Secondary | ICD-10-CM | POA: Insufficient documentation

## 2024-01-20 ENCOUNTER — Telehealth: Payer: Self-pay

## 2024-01-20 NOTE — Telephone Encounter (Signed)
 Call could not be completed as dialed. Mychart message will be sent

## 2024-01-20 NOTE — Therapy (Signed)
 " OUTPATIENT OCCUPATIONAL THERAPY TREATMENT NOTE  BLE/BLQ  Lipo-Lymphedema   Patient Name: Norma Wall MRN: [id number] DOB:Nov 18, 1970, 53 y.o., female Today's Date: 01/20/2024  END OF SESSION:  OT End of Session - 01/19/24 1610     Visit Number 9    Number of Visits 36    Date for Recertification  01/10/24    OT Start Time 0306    OT Stop Time 0409    OT Time Calculation (min) 63 min    Activity Tolerance Patient tolerated treatment well;No increased pain    Behavior During Therapy [facility] for tasks assessed/performed            Past Medical History:  Diagnosis Date   Abdominal pain, right upper quadrant 2013   Anxiety    Cancer (HCC)    basal cells- [place]   COVID-19 2021   Endometriosis 01/2013   Gallbladder polyp    Lipidemia 2020   Lymphedema 01/2019   Migraine headache    3-4x/yr   Ovarian cyst 2014   right ovary   PONV (postoperative nausea and vomiting)    Vertigo    resolves quickly   Past Surgical History:  Procedure Laterality Date   ABLATION  2012   cyst removal   CHOLECYSTECTOMY N/A 10/19/2018   Procedure: LAPAROSCOPIC CHOLECYSTECTOMY;  Surgeon: Jordis Laneta FALCON, MD;  Location: [facility];  Service: General;  Laterality: N/A;   COLONOSCOPY WITH PROPOFOL  N/A 07/21/2020   Procedure: COLONOSCOPY WITH PROPOFOL ;  Surgeon: Jinny Carmine, MD;  Location: [facility];  Service: Endoscopy;  Laterality: N/A;   CYSTOSCOPY N/A 09/25/2020   Procedure: CYSTOSCOPY;  Surgeon: Lake Read, MD;  Location: [facility];  Service: Gynecology;  Laterality: N/A;   CYSTOSCOPY W/ URETERAL STENT PLACEMENT Right 09/25/2020   Procedure: CYSTOSCOPY WITH RETROGRADE PYELOGRAM/URETERAL STENT PLACEMENT;  Surgeon: Francisca Redell BROCKS, MD;  Location: [facility];  Service: Urology;  Laterality: Right;   LAPAROSCOPIC OVARIAN CYSTECTOMY Right 10/19/2018   Procedure: LAPAROSCOPIC RIGHT OVARIAN CYSTECTOMY;  Surgeon: Lake Read, MD;  Location: [facility];  Service: Gynecology;   Laterality: Right;   NECK SURGERY  02/22/2018   Artificial Disc replacement    OVARIAN CYST REMOVAL     POLYPECTOMY N/A 07/21/2020   Procedure: POLYPECTOMY;  Surgeon: Jinny Carmine, MD;  Location: [facility];  Service: Endoscopy;  Laterality: N/A;   TOTAL LAPAROSCOPIC HYSTERECTOMY WITH SALPINGECTOMY Bilateral 09/25/2020   Procedure: TOTAL LAPAROSCOPIC HYSTERECTOMY WITH SALPINGECTOMY;  Surgeon: Lake Read, MD;  Location: [facility];  Service: Gynecology;  Laterality: Bilateral;   Patient Active Problem List   Diagnosis Date Noted   Hormone replacement therapy (HRT) 06/07/2023   Menopause 03/08/2023   Lower extremity numbness and tingling (Left) 02/24/2023   Lumbar radiculitis (Left) 02/24/2023   History of postoperative nausea and vomiting 11/25/2022   Lumbar facet joint hypertrophy (Multilevel) (Bilateral) 11/10/2022   Osteoarthritis of facet joint of lumbar spine 11/10/2022   Lumbosacral facet joint syndrome 11/10/2022   Osteoarthritis of lumbar spine 11/10/2022   Spondylosis without myelopathy or radiculopathy, lumbosacral region 11/10/2022   Lumbar lateral recess stenosis (Bilateral: L2-3, L3-4, L4-5) 11/10/2022   Lumbosacral foraminal stenosis (Bilateral: L3-4, L4-5) 11/10/2022   Ligamentum flavum hypertrophy (L4-5) 11/10/2022   Osteoarthritis of hip (Right) 11/10/2022   Enthesopathy of hip region (Right) 11/10/2022   Hamstring tendinitis of thigh (Right) 11/10/2022   Iliopsoas bursitis of hip (Left) 11/10/2022   Cervical disc protrusion at C5-6 (Left) 11/10/2022   Cervical disc herniation/extrusion at C6-7 level  (Right)  11/10/2022   Cervical C8 nerve root impingement (Right) 11/10/2022   Cervical lateral recess stenosis (Left: C5-6) (Right:C5-6) 11/10/2022   DDD (degenerative disc disease), cervical 11/10/2022   DDD (degenerative disc disease), lumbar 11/10/2022   Chronic hip pain (1ry area of Pain) (Right) 10/25/2022   Iliopsoas bursitis of hip (Right) 10/25/2022    Groin pain (Right) 10/25/2022   Chronic low back pain (Bilateral) w/o sciatica 10/25/2022   Chronic low back pain (2ry area of Pain) (Midline) w/o sciatica 10/25/2022   Lumbar facet joint pain 10/25/2022   Grade 1 Retrolisthesis of Lumbar region (L2/L3, L3/L4, L4/L5) 10/25/2022   Abnormal MRI, Hip (Right) (09/04/2022) 10/24/2022   Abnormal MRI, lumbar spine (09/03/2022) 10/24/2022   Chronic pain syndrome 10/24/2022   Pharmacologic therapy 10/24/2022   Disorder of skeletal system 10/24/2022   Problems influencing health status 10/24/2022   Abnormal MRI, cervical spine (01/18/2018) 10/24/2022   Arthralgia of right hip 06/29/2022   Multilevel lumbosacral spondylosis with radiculopathy 06/29/2022   Low libido 05/13/2022   Hot flashes 05/13/2022   Hematuria 03/01/2022   Left lower quadrant abdominal pain 03/01/2022   Right upper quadrant abdominal pain 03/01/2022   S/P laparoscopic hysterectomy 09/25/2020   Pelvic pain    Polyp of ascending colon    Lymphedema 01/20/2019   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 Always felt I were larger than my friends. With both of my pregnancies I had a lot of swelling in my legs ( 2000 and 2006). Also remember in 2010 noticing how swollen my ankles were after work.  SUBJECTIVE:   SUBJECTIVE STATEMENT: Pt presents for OT treatment visit to address BLE Lipo-lymphedema. Pt reports no new concerns. The patient had  no problems after MLD last session. Pt reports BLE pain as 1-2/10. Pt reports she has not heard from DME vendor re the cost of her garments. She states she did get an email confirming she has insurance coverage.   (OT INITIAL EVAL: Ahliya Glatt is referred to Occupational Therapy by Jon Eva, MD, for evaluation and treatment of BLE lymphedema. Pt relays that she requested referral so she could learn more about her diagnosis and explore treatment options.  Pt reports,  When growing up I always felt like I had big legs and feet. During both pregnancies her legs were abnormally swollen, and they remained swollen afterwards. Pt endorses family hx of chronic leg swelling in her mother and maternal grandmother. Pt reports she has not previously undergone formal lymphedema treatment in the past, but she tries to follow conservative measures, including leg elevation her.  She occasionally wears knee length compression stockings, but has never found any that are comfortable and that fit.  They roll down and they hurt. She reports she is diagnoses with CVI by Earma Johnita Birch, MD,  at the [facility] in [place], [place]. Pt reports she has undergone several vein ablations. She reports she also uses a compression pump regularly, but she is unsure of the manufacturer. Pt's goals for OT are,  to L learn about MLD and get comfortable compression that works.   PERTINENT HISTORY: CVI ( multiple vein ablations at chronic hip and back pain 2/2 multiple complex problems including multiple level DJD, OA,  Multilevel lumbosacral spondylosis with radiculopathy, tendonitis, bursitis, obesity (30-39.9), Lipedema  PAIN:  Are you having pain? Yes: NPRS scale: 1-2/10 on average Pain location: full limb and worse in thighs Pain description: tenderness, dull deep ache, almost like a bone ache, hypersensitive to touch Aggravating factors: worse at rest, at night it's more, climbing stairs and  inclines Relieving factors: conservative therapies- pump, epsom salts baths, vibration plate, elevation  PRECAUTIONS: Other: Lymphedema  FALLS:  Has patient fallen in last 6 months? No  LIVING ENVIRONMENT: Lives with:  spouse, 55 yo son and dog Lives in: House/apartment Stairs: Yes a few to enter; Internal: 1 flight steps; on right going up and   Has following equipment at home: None  OCCUPATION: office manager   LEISURE: reading, crafts, photography, skyjacking, fishing  HAND DOMINANCE: right   PRIOR LEVEL OF FUNCTION: Independent  PATIENT GOALS: Learn about MLD; get comfortable compression that works  OBJECTIVE: Note: Objective measures were completed at Evaluation unless otherwise noted.  COGNITION:  Overall cognitive status: Within functional limits for tasks assessed   OBSERVATIONS / OTHER ASSESSMENTS:   POSTURE: WNL  LE ROM: WFL  STRENGTH: WFL  Mild, Stage  II, Bilateral Lower Extremity Lymphedema 2/2 Lipedema and CVI   Skin  Description Hyper-Keratosis Peau d' Orange Shiny Tight Fibrotic/ Indurated Fatty Doughy Spongy/ boggy   x   x Fatty fibrosis, nodular fat x x x   Skin dry Flaky WNL Macerated     x    Color Redness Varicosities Blanching Hemosiderin Stain Mottled    x    x   Odor Malodorous Yeast Fungal infection  WNL      x   Temperature Warm Cool wnl    x     Pitting Edema   1+ 2+ 3+ 4+ Non-pitting         x   Girth Symmetrical Asymmetrical                   Distribution   X Feet spared  toes to abdomen, hips and buttocks    Stemmer Sign Positive Negative    Feet spared   Lymphorrhea History Of:  Present Absent     x    Wounds History Of Present Absent Venous Arterial Pressure Sheer     x        Signs of Infection Redness Warmth Erythema Acute Swelling Drainage Borders                    Sensation Light Touch Deep pressure Hypersensitivity   In Tact Impaired In Tact Impaired Absent Impaired   x  x   x    Nails  WNL   Fungus nail dystrophy   x     Hair Growth Symmetrical Asymmetrical   x    Skin Creases Base of toes  Ankles   Base of Fingers knees       Abdominal pannus Thigh Lobules  Face/neck    X Pantaloon deformity  x       BLE COMPARATIVE LIMB VOLUMETRICS TBA OT Rx 1  LANDMARK RIGHT    R LEG (A-D) 7728.4 ml  R THIGH (E-G) 9757.0 ml  R FULL LIMB (A-G) 17485.3 ml  Limb Volume differential (LVD)  R Leg LVD=  3.6%, R>L, R THIGH LVD = 4.9, R>L, Full Limb LVD= 4.3%, R>L  Volume change since initial %  Volume change overall V  (Blank rows = not tested)  LANDMARK LEFT  09/23/21  L LEG (A-D) 7447.0 ml  L THIGH (E-G) 9276.3 ml  L  FULL LIMB (A-G) 16725.2 ml  Limb Volume differential (LVD)  %  Volume change since initial %  Volume change overall %  (Blank rows = not tested)     LYMPHEDEMA ASSESSMENTS:   SURGERY TYPE/DATE: N/A non cancer related  INFECTIONS: denies hx cellulitis  WOUNDS: denies  INJURIES/TRAUMA: significant L achilles injury as a chid  LYMPHEDEMA ASSESSMENTS:   GAIT: Distance walked: >500' Assistive device utilized: None Level of assistance: Complete Independence Comments: WFL  LYMPHEDEMA LIFE IMPACT SCALE (LLIS):   Initial 10/11/23 60.29% (The extent of which lymphedema-related problems impacted your life in the last week)                                                                                                                       TREATMENT THIS DATE: RLE/RLQ MLD Pt/ family edu  PATIENT EDUCATION:  Continued Pt/ CG edu for lymphedema self care home program throughout session. Topics include outcome of comparative limb volumetrics- starting limb volume differentials (LVDs), technology and gradient techniques used for short stretch, multilayer compression wrapping, simple self-MLD, therapeutic lymphatic pumping exercises, skin/nail care, LE precautions, compression garment recommendations and specifications, wear and care schedule and  compression garment donning / doffing w assistive devices. Discussed progress towards all OT goals since commencing CDT. Discussed detrimental impact of obesity on lower and upper extremity lymphedema over time. Reviewed OT goals for lymphedema care with Pt and discussed progress to date.  All questions answered to the Pt's satisfaction. Good return. Person educated: Patient  Education method: Explanation, Demonstration, and Handouts Education comprehension: verbalized understanding, returned demonstration, verbal cues required, and needs further education   LYMPHEDEMA SELF-CARE HOME PROGRAM:  BLE lymphatic pumping there ex- 1 set of 10 each element, in order. Hold 5. 2 x daily 2. Daily, short stretch, thigh length, multilayer compression bandages during Intensive Phase CDT- one LE at a time 3. During Self-management Phase:   Ready-to-wear, medical grade, gradient compression garments  are necessary to provide appropriate compression and containment when performing lite duty daily activities. Appropriate compression garments ar essential for managing chronic, progressive lipo-lymphedema.  Pt should be fit with 3 prs of Juzo SENSATION (3900-AT) pantyhose, 15-20 mmHg, SIZE L-PLUS, Regular length.  Custom, flat knit, medical grade, gradient, compression garments are medically necessary when performing heavy physical activities. For managing chronic, progressive lipo-lymphedema Pt should be fit with custom, flat knit compression garments that  fit Pt's dimensions exactly for more physically demanding activities that significantly increase the lymphatic load in tissues.  Multiple custom compression garments are needed to ensure proper hygiene to limit infection risk. Custom compression garments should be replaced q 3-6 months When worn consistently for optimal lipo-lymphedema self-management over time. HOS  devices, medically necessary to limit fibrosis buildup in tissue, should be replaced q 2 years and  PRN when worn out.   4. Daily skin care with low ph lotion matching skin ph 5. Daily use of advanced, sequential , pneumatic compression device (Flexitouch) combined with self-MLD     ASSESSMENT:  CLINICAL IMPRESSION:  Continued  RLE/RLQ MLD and provided Pt edu for simple self MLD. Utilized in tact inguinal watershed after activating terminus. Pt performs diaphragmatic breathing independently  Pt tolerated manual therapy and asked excellent questions. No palpable or visible volume or tissue density reduction observed after MLD. OT faxed note and compression garment specifications to DME vendor after session.  Pt is ready to be fit with Ready-to-wear, medical grade, gradient compression garments  are necessary to provide appropriate compression and containment when performing lite duty daily activities. Appropriate compression garments ar essential for managing chronic, progressive lipo-lymphedema.  Pt should be fit with 3 prs of Juzo SENSATION (3900-AT) pantyhose, 15-20 mmHg, SIZE L-PLUS, Regular length.  Cont as per POC.   (INITIAL EVAL: Faron Whitelock is a 53 yo female presenting with moderate, stage II, BLE, lipo-lymphedema 2/2 primary, stage 2, type 3 ( buttocks, abdomen and ankles), lipedema. Lipidema is a primary disorder characterized by the symmetrical fatty enlargement of bilateral legs, buttocks, abdomen, and sometimes the arms, due to excessive fat deposition, or storage, in the lower quadrants. The upper half of the body is unaffected, and despite recent weight loss, the lower half of the body is disproportionately larger than the upper half. As lipedema progresses fibrotic, nodular fat accumulates in the subcutis putting pressure on delicate lymphatics, which in turn leads to excessive, high protein lymph accumulation, resulting in chronic, progressive lymphedema. The combined condition of lipo-lymphedema is difficult to treat as lipedema does not typically respond well to Complete  Decongestive Therapy (CDT). The lymphedema side of the condition may respond to varying degrees. Well fitting, comfortable compression garments that do not constrict or roll down often provide pain relief and enable improved functional performance. An advanced, sequential, pneumatic, compression device, the T[employer] Flexitouch device is recommended because it addresses the affected abdomen and buttocks, proximal to the regional, inguinal lymph nodes. The advanced Flexitouch device is known reduce pain and lymphatic congestion, as well as infection risk in those with lipo-lymphedema. If Pt is using a basic vaso-pneumatic pump with squeeze and release function, it should be replaced with an advanced sequential pneumatic compression device for optimal, long term, daily, lipo-lymphedema self-management over time.    Ms Steines will benefit from skilled Occupational Therapy to reduce limb swelling and associated pain, to limit progression and infection risk associated with chronic, progressive Lymphedema, and to learn lymphedema self management protocol for long term self management.   SABRA BLE lipo- lymphedema limits functional performance in all occupational domains, including functional mobility and ambulation, basic and instrumental ADLs, productive activities, leisure pursuits, social participation and quality of life. This patient will benefit from modified Intensive and Self-management Phase CDT . In addition to MLD, therex, skin care and compression bandaging below the knees, Pt will be fitted with custom knee length compression stockings:   OBJECTIVE IMPAIRMENTS: decreased knowledge of condition, decreased knowledge of use of DME, decreased mobility, decreased ROM, increased edema, impaired flexibility, impaired sensation, obesity, pain, and chronic, progressive, painful BLE/BLQ swelling.   ACTIVITY LIMITATIONS:  Limited Functional ambulation and mobility (dependent sitting, standing, walking,  squatting, stairs, transfers, bed mobility) Impaired basic and instrumental ADLs: sleeping, grooming toe nails, inspecting  skin and performing LE/LLQ skin care, LB bathing, LB dressing, fitting shoes,  Impaired social participation:  when extended standing, walking and sitting are necessary, such as when shopping, going to the movies, attending community and church functions Impaired leisure pursuits when extended sitting, standing , walking required Quality of Life (Psychosocial concerns ):   PERSONAL FACTORS: Time since onset of injury/illness/exacerbation and 3+ comorbidities: primary lipidema, obesity, chronic pain are also affecting patient's functional outcome.   REHAB POTENTIAL: Good  EVALUATION COMPLEXITY: Moderate   GOALS:Goals reviewed with patient? Yes   SHORT TERM GOALS: Target date: 4th OT Rx visit    Pt will demonstrate understanding of lymphedema precautions and prevention strategies with modified independence using a printed reference to identify at least 5 precautions and discussing how s/he may implement them into daily life to reduce risk of progression with modified assistance Baseline: max a Goal status: INITIAL   2.  With modified independence (extra time)  Pt will be able to apply multilayer, thigh length, compression wraps using gradient techniques  to decrease limb volume, to limit infection risk, and to limit lymphedema progression.  Given this patient's Intake score of tbd % on the  Baseline: Dependent Goal status: INITIAL     LONG TERM GOALS: Target date: 01/09/24(12 WEEKS)     Given this patient's Intake score of 60.29% on the Lymphedema Life Impact Scale (LLIS), patient will experience a reduction of at least 20 points in the extent to which lymphedema-elated problems impact her life on a weekly basis to her perceived level to improve functional performance and quality of life (QOL). Baseline: max a Goal status: INITIAL   2.  With modified independence  (extra time and assistive devices) Pt will be able to don and doff appropriate compression garments and/or devices to control BLE lymphedema and to limit progression.  Baseline: Dependent Goal status: INITIAL  3. Pt  will be able to perform each lymphedema self-care home program component ( simple self MLD, lymphatic pumping ther ex, skin care, compression therapy) using correct techniques using printed work book as education administrator by DC for optimal LE self management over time to limit progression.   4. Pt will achieve at least a 10% volume reductions bilaterally below the knees to return limb to more typical size and shape, to limit infection risk and LE progression, to decrease pain, to improve function, and to improve body image and QOL. Baseline: Dependent Goal status: INITIAL   5. Pt will achieve and sustain at least 85% attendance at OT sessions, and with compliance with all LE self-care home program components throughout CDT, including modified simple self-MLD, daily skin care and inspection, lymphatic pumping the ex and appropriate compression to limit lymphedema progression and to limit further functional decline. Baseline: Dependent Goal status: INITIAL PLAN:  OT FREQUENCY: 2x/week and PRN  OT DURATION: other: 16 weeks and PRN due to bilaterality  PLANNED INTERVENTIONS: Complete Decongestive Therapy (CDT): Manual therapy (manual lymphatic drainage (MLD), therapeutic lymphatic pumping exercise, skin care to limit infection risk, compression ( wraps and garments, devices prn)97110-Therapeutic exercises, 97530- Therapeutic activity, 97535- Self Care, [id number]- Manual therapy, Patient/Family education, Manual lymph drainage, Compression bandaging, and DME instructions  PLAN FOR NEXT SESSION:  BLE comparative limb volumetrics Multilayer compression bandaging to one leg at a time. Start with knee length and build tolerance to thigh length. Pt. Family edu   Zebedee Dec, MS, OTR/L,  [facility] 01/20/2024 9:32 AM  "

## 2024-01-20 NOTE — Telephone Encounter (Signed)
 Copied from CRM #[id number]. Topic: Appointments - Scheduling Inquiry for Clinic >> Jan 19, 2024  4:27 PM Shanda MATSU wrote: Reason for CRM: Patient wants to know if the issues she wants to discuss with the provider on her appt scheduled for 02/23/2024 can be done all in one appt scheduled for 03/13/24 which is when she is scheduled for her cpe, patient is req a call back in regards to this.

## 2024-01-20 NOTE — Telephone Encounter (Signed)
 If call is returned ok for E2C2 to advise the following   Typically a complete physical exam only cover preventative information. Additional information as to what that all includes has been sent to you via mychart. When at your physical and wanting to discuss things out of that scope it is at your providers discretion if it will be addressed that day or if they think you should make another appointment. If discussed during the physical you would be charged for a regular office visit as well as the physical office visit. Dr.B will be out of the office for the holidays until 01/30/24 because you already have another appointment scheduled on 02/23/24 to discuss your other concerns I would keep that appointment for now along with your appointment scheduled on 03/13/24 for your complete physical exam and upon Dr.B's return we will get your question to her and see what she advises. Is that okay with you ?

## 2024-02-07 ENCOUNTER — Ambulatory Visit: Admitting: Occupational Therapy

## 2024-02-15 ENCOUNTER — Ambulatory Visit: Admitting: Occupational Therapy

## 2024-02-22 ENCOUNTER — Ambulatory Visit: Admitting: Occupational Therapy

## 2024-02-23 ENCOUNTER — Encounter: Payer: Self-pay | Admitting: Family Medicine

## 2024-02-23 ENCOUNTER — Ambulatory Visit: Admitting: Family Medicine

## 2024-02-23 VITALS — BP 119/74 | HR 69 | Resp 16 | Ht 67.0 in | Wt 242.0 lb

## 2024-02-23 DIAGNOSIS — Z789 Other specified health status: Secondary | ICD-10-CM

## 2024-02-23 DIAGNOSIS — E559 Vitamin D deficiency, unspecified: Secondary | ICD-10-CM | POA: Diagnosis not present

## 2024-02-23 DIAGNOSIS — E669 Obesity, unspecified: Secondary | ICD-10-CM | POA: Diagnosis not present

## 2024-02-23 DIAGNOSIS — L608 Other nail disorders: Secondary | ICD-10-CM | POA: Diagnosis not present

## 2024-02-23 NOTE — Progress Notes (Signed)
 "  Established Patient Office Visit  Subjective   Patient ID: Norma Wall, female    DOB: 1970-03-08  Age: 54 y.o. MRN: [id number]  Chief Complaint  Patient presents with   Weight Loss    Pt has tried  Diet: cutting carbs, works but not much. Eating carbs causes leg swelling. Has tried optavia. Exercise: walking just finished a 30 day challenge no difference. No programs or medication to loose weight before.   Nail Problem    Dark long line on toe, saw a vein dr and recommended sometimes could be due to vitamin deficiency. Would like a vitamin panel done    Norma Wall presents today to discuss starting a GLP-1 and black line on right big toe. Has been interested in starting GLP-1 for reducing inflammation related to lipodema. Discussed the insurance coverage, cost, and various routes of taking medication. Black line on toe has been present for nearly two years. During this time nail has also been weak and brittle for which she has been using nail strengthening ointment. Has seen podiatrist but no resolution and is now concerned for vitamin deficiencies.         Review of Systems  Neurological:  Negative for tingling and sensory change.      Objective:     BP 119/74   Pulse 69   Resp 16   Ht 5' 7 (1.702 m)   Wt 242 lb (109.8 kg)   LMP 09/08/2020   SpO2 99%   BMI 37.90 kg/m    Physical Exam Constitutional:      Appearance: Normal appearance.  Cardiovascular:     Rate and Rhythm: Normal rate and regular rhythm.     Pulses: Normal pulses.     Heart sounds: Normal heart sounds.  Pulmonary:     Effort: Pulmonary effort is normal.     Breath sounds: Normal breath sounds.  Skin:    Comments: Grayish discoloration of right big toe nail  Neurological:     Mental Status: She is alert.      No results found for any visits on 02/23/24.    The 10-year ASCVD risk score (Arnett DK, et al., 2019) is: 0.9%    Assessment & Plan:   Problem List Items  Addressed This Visit       Other   Obesity (BMI 30-39.9)   Patient is interested in starting GLP-1 for inflammation related to lipodema. Discussed medication options including insurance coverage, cost, and routes (oral, injectable.SABRA) Patient wants to look into her options and coverage and read more about Wegovy vs Zepbound before committing. She will message when she decides and if she would like an Rx - Check CMP       Relevant Orders   VITAMIN D  25 Hydroxy (Vit-D Deficiency, Fractures)   Vitamin B12   Hepatitis B Surface AntiBODY   Comprehensive metabolic panel with GFR   TSH   Lipid Panel With LDL/HDL Ratio   CBC with Differential/Platelet   Methylmalonic Acid   Avitaminosis D   Has been taking supplemental vitamin D  consistently. Will recheck levels today.       Relevant Orders   VITAMIN D  25 Hydroxy (Vit-D Deficiency, Fractures)   Nail discoloration - Primary   Has had grayish discoloration of right big toe nail for almost 2 years. The nail has also been weak and brittle. Has seen podiatry and using nail strengthening ointment.  - Check vitamin B12 levels for deficiency      Relevant  Orders   VITAMIN D  25 Hydroxy (Vit-D Deficiency, Fractures)   Vitamin B12   Comprehensive metabolic panel with GFR   TSH   CBC with Differential/Platelet   Methylmalonic Acid   Other Visit Diagnoses       Hepatitis B vaccination status unknown       Relevant Orders   Hepatitis B Surface AntiBODY       Return if symptoms worsen or fail to improve.    Kamryn J Mills, Medical Student   Patient seen along with MS3 student, Johnsie Barefoot. I personally evaluated this patient along with the student, and verified all aspects of the history, physical exam, and medical decision making as documented by the student. I agree with the student's documentation and have made all necessary edits.  Jaliah Foody, Jon HERO, MD, MPH [facility]    "

## 2024-02-23 NOTE — Assessment & Plan Note (Addendum)
 Patient is interested in starting GLP-1 for inflammation related to lipodema. Discussed medication options including insurance coverage, cost, and routes (oral, injectable.SABRA) Patient wants to look into her options and coverage and read more about Wegovy vs Zepbound before committing. She will message when she decides and if she would like an Rx - Check CMP

## 2024-02-23 NOTE — Assessment & Plan Note (Signed)
 Has been taking supplemental vitamin D  consistently. Will recheck levels today.

## 2024-02-23 NOTE — Assessment & Plan Note (Addendum)
 Has had grayish discoloration of right big toe nail for almost 2 years. The nail has also been weak and brittle. Has seen podiatry and using nail strengthening ointment.  - Check vitamin B12 levels for deficiency

## 2024-02-25 LAB — COMPREHENSIVE METABOLIC PANEL WITH GFR
ALT: 18 [IU]/L (ref 0–32)
AST: 25 [IU]/L (ref 0–40)
Albumin: 4.3 g/dL (ref 3.8–4.9)
Alkaline Phosphatase: 58 [IU]/L (ref 49–135)
BUN/Creatinine Ratio: 23 (ref 9–23)
BUN: 21 mg/dL (ref 6–24)
Bilirubin Total: 0.2 mg/dL (ref 0.0–1.2)
CO2: 23 mmol/L (ref 20–29)
Calcium: 10.1 mg/dL (ref 8.7–10.2)
Chloride: 99 mmol/L (ref 96–106)
Creatinine, Ser: 0.93 mg/dL (ref 0.57–1.00)
Globulin, Total: 2.6 g/dL (ref 1.5–4.5)
Glucose: 87 mg/dL (ref 70–99)
Potassium: 4.3 mmol/L (ref 3.5–5.2)
Sodium: 136 mmol/L (ref 134–144)
Total Protein: 6.9 g/dL (ref 6.0–8.5)
eGFR: 73 mL/min/{1.73_m2}

## 2024-02-25 LAB — CBC WITH DIFFERENTIAL/PLATELET
Basophils Absolute: 0.1 10*3/uL (ref 0.0–0.2)
Basos: 1 %
EOS (ABSOLUTE): 0.1 10*3/uL (ref 0.0–0.4)
Eos: 2 %
Hematocrit: 40.2 % (ref 34.0–46.6)
Hemoglobin: 12.7 g/dL (ref 11.1–15.9)
Immature Grans (Abs): 0 10*3/uL (ref 0.0–0.1)
Immature Granulocytes: 0 %
Lymphocytes Absolute: 1.8 10*3/uL (ref 0.7–3.1)
Lymphs: 40 %
MCH: 26.1 pg — ABNORMAL LOW (ref 26.6–33.0)
MCHC: 31.6 g/dL (ref 31.5–35.7)
MCV: 83 fL (ref 79–97)
Monocytes Absolute: 0.5 10*3/uL (ref 0.1–0.9)
Monocytes: 11 %
Neutrophils Absolute: 2.1 10*3/uL (ref 1.4–7.0)
Neutrophils: 46 %
Platelets: 235 10*3/uL (ref 150–450)
RBC: 4.87 x10E6/uL (ref 3.77–5.28)
RDW: 14.7 % (ref 11.7–15.4)
WBC: 4.6 10*3/uL (ref 3.4–10.8)

## 2024-02-25 LAB — LIPID PANEL WITH LDL/HDL RATIO
Cholesterol, Total: 194 mg/dL (ref 100–199)
HDL: 69 mg/dL
LDL Chol Calc (NIH): 105 mg/dL — ABNORMAL HIGH (ref 0–99)
LDL/HDL Ratio: 1.5 ratio (ref 0.0–3.2)
Triglycerides: 113 mg/dL (ref 0–149)
VLDL Cholesterol Cal: 20 mg/dL (ref 5–40)

## 2024-02-25 LAB — VITAMIN B12: Vitamin B-12: 447 pg/mL (ref 232–1245)

## 2024-02-25 LAB — TSH: TSH: 2.25 u[IU]/mL (ref 0.450–4.500)

## 2024-02-25 LAB — HEPATITIS B SURFACE ANTIBODY,QUALITATIVE: Hep B Surface Ab, Qual: NONREACTIVE

## 2024-02-25 LAB — VITAMIN D 25 HYDROXY (VIT D DEFICIENCY, FRACTURES): Vit D, 25-Hydroxy: 50.8 ng/mL (ref 30.0–100.0)

## 2024-02-25 LAB — METHYLMALONIC ACID, SERUM: Methylmalonic Acid: 227 nmol/L (ref 0–378)

## 2024-02-28 ENCOUNTER — Ambulatory Visit: Payer: Self-pay | Admitting: Family Medicine

## 2024-02-29 ENCOUNTER — Ambulatory Visit: Admitting: Occupational Therapy

## 2024-03-07 ENCOUNTER — Ambulatory Visit: Admitting: Occupational Therapy

## 2024-03-13 ENCOUNTER — Encounter: Admitting: Family Medicine

## 2024-03-19 ENCOUNTER — Ambulatory Visit: Admitting: Occupational Therapy
